# Patient Record
Sex: Female | Born: 1965 | Race: Black or African American | Hispanic: No | Marital: Single | Smoking: Never smoker
Health system: Southern US, Community
[De-identification: ages and names within clinical notes are randomized; demographics above are authoritative.]

## PROBLEM LIST (undated history)

## (undated) DIAGNOSIS — R51 Headache: Secondary | ICD-10-CM

## (undated) DIAGNOSIS — R42 Dizziness and giddiness: Secondary | ICD-10-CM

## (undated) DIAGNOSIS — J302 Other seasonal allergic rhinitis: Secondary | ICD-10-CM

## (undated) DIAGNOSIS — T7840XA Allergy, unspecified, initial encounter: Secondary | ICD-10-CM

## (undated) DIAGNOSIS — I10 Essential (primary) hypertension: Secondary | ICD-10-CM

## (undated) DIAGNOSIS — G4733 Obstructive sleep apnea (adult) (pediatric): Secondary | ICD-10-CM

## (undated) DIAGNOSIS — D649 Anemia, unspecified: Secondary | ICD-10-CM

## (undated) DIAGNOSIS — Z973 Presence of spectacles and contact lenses: Secondary | ICD-10-CM

## (undated) DIAGNOSIS — M1990 Unspecified osteoarthritis, unspecified site: Secondary | ICD-10-CM

## (undated) DIAGNOSIS — K2970 Gastritis, unspecified, without bleeding: Secondary | ICD-10-CM

## (undated) DIAGNOSIS — R519 Headache, unspecified: Secondary | ICD-10-CM

## (undated) HISTORY — DX: Allergy, unspecified, initial encounter: T78.40XA

## (undated) HISTORY — DX: Obstructive sleep apnea (adult) (pediatric): G47.33

## (undated) HISTORY — PX: OTHER SURGICAL HISTORY: SHX169

## (undated) HISTORY — DX: Anemia, unspecified: D64.9

## (undated) HISTORY — PX: COLONOSCOPY: SHX174

## (undated) HISTORY — DX: Essential (primary) hypertension: I10

---

## 2003-02-06 ENCOUNTER — Emergency Department (HOSPITAL_COMMUNITY): Admission: EM | Admit: 2003-02-06 | Discharge: 2003-02-06 | Payer: Self-pay | Admitting: Emergency Medicine

## 2005-10-26 ENCOUNTER — Emergency Department: Payer: Self-pay | Admitting: General Practice

## 2008-06-29 ENCOUNTER — Ambulatory Visit: Payer: Self-pay | Admitting: Internal Medicine

## 2010-06-19 ENCOUNTER — Ambulatory Visit: Payer: Self-pay | Admitting: Internal Medicine

## 2011-05-31 ENCOUNTER — Ambulatory Visit: Payer: Self-pay

## 2011-07-04 ENCOUNTER — Ambulatory Visit: Payer: Self-pay

## 2012-06-09 ENCOUNTER — Ambulatory Visit: Payer: Self-pay | Admitting: Podiatry

## 2012-10-15 ENCOUNTER — Ambulatory Visit: Payer: Self-pay | Admitting: Physician Assistant

## 2012-11-15 ENCOUNTER — Emergency Department: Payer: Self-pay | Admitting: Emergency Medicine

## 2012-11-15 LAB — COMPREHENSIVE METABOLIC PANEL
Alkaline Phosphatase: 112 U/L (ref 50–136)
BUN: 8 mg/dL (ref 7–18)
Bilirubin,Total: 0.4 mg/dL (ref 0.2–1.0)
Chloride: 106 mmol/L (ref 98–107)
Creatinine: 0.75 mg/dL (ref 0.60–1.30)
EGFR (African American): >60
SGPT (ALT): 29 U/L (ref 12–78)
Sodium: 140 mmol/L (ref 136–145)
Total Protein: 7.7 g/dL (ref 6.4–8.2)

## 2012-11-15 LAB — CBC
HCT: 34.3 % — ABNORMAL LOW (ref 35.0–47.0)
HGB: 10.9 g/dL — ABNORMAL LOW (ref 12.0–16.0)
MCH: 23.1 pg — ABNORMAL LOW (ref 26.0–34.0)
MCHC: 31.7 g/dL — ABNORMAL LOW (ref 32.0–36.0)
MCV: 73 fL — ABNORMAL LOW (ref 80–100)
Platelet: 327 10*3/uL (ref 150–440)
RDW: 16.5 % — ABNORMAL HIGH (ref 11.5–14.5)
WBC: 13.6 10*3/uL — ABNORMAL HIGH (ref 3.6–11.0)

## 2012-11-15 LAB — CK TOTAL AND CKMB (NOT AT ARMC)
CK, Total: 113 U/L (ref 21–215)
CK-MB: 0.7 ng/mL (ref 0.5–3.6)

## 2012-11-15 LAB — URINALYSIS, COMPLETE
Bacteria: NONE SEEN
Nitrite: NEGATIVE
Ph: 6 (ref 4.5–8.0)
Protein: 100
Specific Gravity: 1.028 (ref 1.003–1.030)

## 2012-12-09 HISTORY — PX: FOOT SURGERY: SHX648

## 2013-10-18 ENCOUNTER — Ambulatory Visit: Payer: Self-pay

## 2013-11-10 ENCOUNTER — Ambulatory Visit: Payer: Self-pay

## 2014-10-24 ENCOUNTER — Ambulatory Visit: Payer: Self-pay

## 2015-04-20 ENCOUNTER — Encounter: Payer: Self-pay | Admitting: Hematology and Oncology

## 2015-04-20 ENCOUNTER — Inpatient Hospital Stay: Payer: 59 | Attending: Hematology and Oncology | Admitting: Hematology and Oncology

## 2015-04-20 VITALS — BP 148/84 | HR 72 | Temp 98.2°F | Ht 65.0 in | Wt <= 1120 oz

## 2015-04-20 DIAGNOSIS — Z79899 Other long term (current) drug therapy: Secondary | ICD-10-CM | POA: Insufficient documentation

## 2015-04-20 DIAGNOSIS — R42 Dizziness and giddiness: Secondary | ICD-10-CM | POA: Diagnosis not present

## 2015-04-20 DIAGNOSIS — Z9884 Bariatric surgery status: Secondary | ICD-10-CM | POA: Diagnosis not present

## 2015-04-20 DIAGNOSIS — I10 Essential (primary) hypertension: Secondary | ICD-10-CM | POA: Insufficient documentation

## 2015-04-20 DIAGNOSIS — D509 Iron deficiency anemia, unspecified: Secondary | ICD-10-CM | POA: Diagnosis present

## 2015-04-20 NOTE — Progress Notes (Signed)
Pt here today for initial consult regarding IDA; referred by [facility]

## 2015-04-27 ENCOUNTER — Encounter: Payer: Self-pay | Admitting: *Deleted

## 2015-05-02 NOTE — Discharge Instructions (Signed)

## 2015-05-03 ENCOUNTER — Encounter: Payer: Self-pay | Admitting: *Deleted

## 2015-05-03 ENCOUNTER — Other Ambulatory Visit: Payer: Self-pay | Admitting: Gastroenterology

## 2015-05-03 ENCOUNTER — Ambulatory Visit
Admission: RE | Admit: 2015-05-03 | Discharge: 2015-05-03 | Disposition: A | Discharge status: Home / Self Care | Payer: 59 | Source: Ambulatory Visit | Attending: Gastroenterology | Admitting: Gastroenterology

## 2015-05-03 ENCOUNTER — Ambulatory Visit: Payer: 59 | Admitting: Anesthesiology

## 2015-05-03 ENCOUNTER — Encounter
Admission: RE | Disposition: A | Discharge status: Home / Self Care | Payer: Self-pay | Source: Ambulatory Visit | Attending: Gastroenterology

## 2015-05-03 DIAGNOSIS — K289 Gastrojejunal ulcer, unspecified as acute or chronic, without hemorrhage or perforation: Secondary | ICD-10-CM | POA: Insufficient documentation

## 2015-05-03 DIAGNOSIS — K648 Other hemorrhoids: Secondary | ICD-10-CM | POA: Insufficient documentation

## 2015-05-03 DIAGNOSIS — D509 Iron deficiency anemia, unspecified: Secondary | ICD-10-CM | POA: Diagnosis not present

## 2015-05-03 DIAGNOSIS — I10 Essential (primary) hypertension: Secondary | ICD-10-CM | POA: Insufficient documentation

## 2015-05-03 DIAGNOSIS — Z79899 Other long term (current) drug therapy: Secondary | ICD-10-CM | POA: Diagnosis not present

## 2015-05-03 DIAGNOSIS — M1990 Unspecified osteoarthritis, unspecified site: Secondary | ICD-10-CM | POA: Diagnosis not present

## 2015-05-03 DIAGNOSIS — D649 Anemia, unspecified: Secondary | ICD-10-CM | POA: Diagnosis present

## 2015-05-03 DIAGNOSIS — Z9884 Bariatric surgery status: Secondary | ICD-10-CM | POA: Diagnosis not present

## 2015-05-03 DIAGNOSIS — Z791 Long term (current) use of non-steroidal anti-inflammatories (NSAID): Secondary | ICD-10-CM | POA: Diagnosis not present

## 2015-05-03 DIAGNOSIS — D124 Benign neoplasm of descending colon: Secondary | ICD-10-CM | POA: Insufficient documentation

## 2015-05-03 HISTORY — PX: COLONOSCOPY: SHX5424

## 2015-05-03 HISTORY — DX: Unspecified osteoarthritis, unspecified site: M19.90

## 2015-05-03 HISTORY — DX: Other seasonal allergic rhinitis: J30.2

## 2015-05-03 HISTORY — DX: Presence of spectacles and contact lenses: Z97.3

## 2015-05-03 HISTORY — PX: POLYPECTOMY: SHX149

## 2015-05-03 HISTORY — PX: ESOPHAGOGASTRODUODENOSCOPY: SHX5428

## 2015-05-03 HISTORY — DX: Dizziness and giddiness: R42

## 2015-05-03 HISTORY — DX: Headache: R51

## 2015-05-03 HISTORY — DX: Headache, unspecified: R51.9

## 2015-05-03 SURGERY — COLONOSCOPY
Anesthesia: Monitor Anesthesia Care | Wound class: Contaminated

## 2015-05-03 MED ORDER — GLYCOPYRROLATE 0.2 MG/ML IJ SOLN
INTRAMUSCULAR | Status: DC | PRN
  Administered 2015-05-03: 0.1 mg via INTRAVENOUS

## 2015-05-03 MED ORDER — LIDOCAINE HCL (CARDIAC) 20 MG/ML IV SOLN
INTRAVENOUS | Status: DC | PRN
  Administered 2015-05-03: 50 mg via INTRAVENOUS

## 2015-05-03 MED ORDER — LACTATED RINGERS IV SOLN
INTRAVENOUS | Status: DC
  Administered 2015-05-03 (×2): via INTRAVENOUS

## 2015-05-03 MED ORDER — PROPOFOL 10 MG/ML IV BOLUS
INTRAVENOUS | Status: DC | PRN
  Administered 2015-05-03: 20 mg via INTRAVENOUS
  Administered 2015-05-03: 10 mg via INTRAVENOUS
  Administered 2015-05-03: 30 mg via INTRAVENOUS
  Administered 2015-05-03: 20 mg via INTRAVENOUS
  Administered 2015-05-03 (×2): 50 mg via INTRAVENOUS
  Administered 2015-05-03: 10 mg via INTRAVENOUS
  Administered 2015-05-03 (×2): 50 mg via INTRAVENOUS
  Administered 2015-05-03: 20 mg via INTRAVENOUS
  Administered 2015-05-03: 50 mg via INTRAVENOUS
  Administered 2015-05-03: 10 mg via INTRAVENOUS
  Administered 2015-05-03: 20 mg via INTRAVENOUS
  Administered 2015-05-03: 100 mg via INTRAVENOUS
  Administered 2015-05-03: 50 mg via INTRAVENOUS

## 2015-05-03 MED ORDER — STERILE WATER FOR IRRIGATION IR SOLN
Status: DC | PRN
  Administered 2015-05-03: 08:00:00

## 2015-05-03 SURGICAL SUPPLY — 38 items
BALLN DILATOR 10-12 8 (BALLOONS)
BALLN DILATOR 12-15 8 (BALLOONS)
BALLN DILATOR 15-18 8 (BALLOONS)
BALLN DILATOR CRE 0-12 8 (BALLOONS)
BALLN DILATOR ESOPH 8 10 CRE (MISCELLANEOUS) IMPLANT
BALLOON DILATOR 12-15 8 (BALLOONS) IMPLANT
BALLOON DILATOR 15-18 8 (BALLOONS) IMPLANT
BALLOON DILATOR CRE 0-12 8 (BALLOONS) IMPLANT
BLOCK BITE 60FR ADLT L/F GRN (MISCELLANEOUS) ×4 IMPLANT
CANISTER SUCT 1200ML W/VALVE (MISCELLANEOUS) ×4 IMPLANT
FCP ESCP3.2XJMB 240X2.8X (MISCELLANEOUS)
FORCEPS BIOP RAD 4 LRG CAP 4 (CUTTING FORCEPS) ×4 IMPLANT
FORCEPS BIOP RJ4 240 W/NDL (MISCELLANEOUS)
FORCEPS ESCP3.2XJMB 240X2.8X (MISCELLANEOUS) IMPLANT
GOWN CVR UNV OPN BCK APRN NK (MISCELLANEOUS) ×4 IMPLANT
GOWN ISOL THUMB LOOP REG UNIV (MISCELLANEOUS) ×4
HEMOCLIP INSTINCT (CLIP) IMPLANT
INJECTOR VARIJECT VIN23 (MISCELLANEOUS) IMPLANT
KIT CO2 TUBING (TUBING) ×4 IMPLANT
KIT DEFENDO VALVE AND CONN (KITS) IMPLANT
KIT ENDO PROCEDURE OLY (KITS) ×4 IMPLANT
LIGATOR MULTIBAND 6SHOOTER MBL (MISCELLANEOUS) IMPLANT
MARKER SPOT ENDO TATTOO 5ML (MISCELLANEOUS) IMPLANT
PAD GROUND ADULT SPLIT (MISCELLANEOUS) IMPLANT
SNARE SHORT THROW 13M SML OVAL (MISCELLANEOUS) ×4 IMPLANT
SNARE SHORT THROW 30M LRG OVAL (MISCELLANEOUS) IMPLANT
SPOT EX ENDOSCOPIC TATTOO (MISCELLANEOUS)
SUCTION POLY TRAP 4CHAMBER (MISCELLANEOUS) IMPLANT
SYR INFLATION 60ML (SYRINGE) IMPLANT
TRAP SUCTION POLY (MISCELLANEOUS) ×4 IMPLANT
TUBING CONN 6MMX3.1M (TUBING)
TUBING SUCTION CONN 0.25 STRL (TUBING) IMPLANT
UNDERPAD 30X60 958B10 (PK) (MISCELLANEOUS) IMPLANT
VALVE BIOPSY ENDO (VALVE) IMPLANT
VARIJECT INJECTOR VIN23 (MISCELLANEOUS)
WATER AUXILLARY (MISCELLANEOUS) IMPLANT
WATER STERILE IRR 500ML POUR (IV SOLUTION) ×4 IMPLANT
WIRE CRE 18-20MM 8CM F G (MISCELLANEOUS) IMPLANT

## 2015-05-03 NOTE — Op Note (Signed)
[facility] Gastroenterology Patient Name: Stephanie Gates Procedure Date: 05/03/2015 7:38 AM MRN: [id number] Account #: [id number] Date of Birth: 1966/10/31 Admit Type: Outpatient Age: 49 Room: [facility] OR ROOM 01 Gender: Female Note Status: Finalized Procedure:         Colonoscopy Indications:       Iron deficiency anemia Providers:         Lucilla Lame, MD Referring MD:      Placido Sou (Referring MD) Medicines:         Propofol per Anesthesia Complications:     No immediate complications. Procedure:         Pre-Anesthesia Assessment:                    - Prior to the procedure, a History and Physical was                     performed, and patient medications and allergies were                     reviewed. The patient's tolerance of previous anesthesia                     was also reviewed. The risks and benefits of the procedure                     and the sedation options and risks were discussed with the                     patient. All questions were answered, and informed consent                     was obtained. Prior Anticoagulants: The patient has taken                     no previous anticoagulant or antiplatelet agents. ASA                     Grade Assessment: II - A patient with mild systemic                     disease. After reviewing the risks and benefits, the                     patient was deemed in satisfactory condition to undergo                     the procedure.                    After obtaining informed consent, the colonoscope was                     passed under direct vision. Throughout the procedure, the                     patient's blood pressure, pulse, and oxygen saturations                     were monitored continuously. The Olympus CF H180AL                     colonoscope (S#: [id number]) was introduced through the anus  and advanced to the the cecum, identified by appendiceal                     orifice and  ileocecal valve. The colonoscopy was performed                     without difficulty. The patient tolerated the procedure                     well. The quality of the bowel preparation was excellent. Findings:      The perianal and digital rectal examinations were normal.      A 6 mm polyp was found in the descending colon. The polyp was sessile.       The polyp was removed with a cold snare. Resection and retrieval were       complete.      Non-bleeding internal hemorrhoids were found during retroflexion. The       hemorrhoids were Grade I (internal hemorrhoids that do not prolapse). Impression:        - One 6 mm polyp in the descending colon. Resected and                     retrieved.                    - Non-bleeding internal hemorrhoids. Recommendation:    - Await pathology results. Procedure Code(s): --- Professional ---                    [phone], Colonoscopy, flexible; with removal of tumor(s),                     polyp(s), or other lesion(s) by snare technique Diagnosis Code(s): --- Professional ---                    D50.9, Iron deficiency anemia, unspecified                    D12.4, Benign neoplasm of descending colon CPT copyright 2014 American Medical Association. All rights reserved. The codes documented in this report are preliminary and upon coder review may  be revised to meet current compliance requirements. Lucilla Lame, MD 05/03/2015 8:04:01 AM This report has been signed electronically. Number of Addenda: 0 Note Initiated On: 05/03/2015 7:38 AM Scope Withdrawal Time: 0 hours 5 minutes 37 seconds  Total Procedure Duration: 0 hours 11 minutes 49 seconds       [facility]

## 2015-05-03 NOTE — Op Note (Signed)
[facility] Gastroenterology Patient Name: Stephanie Gates Procedure Date: 05/03/2015 7:37 AM MRN: [id number] Account #: [id number] Date of Birth: [birth year] Admit Type: Outpatient Age: 49 Room: [facility] OR ROOM 01 Gender: Female Note Status: Finalized Procedure:         Upper GI endoscopy Indications:       Iron deficiency anemia Providers:         Lucilla Lame, MD Referring MD:      Placido Sou (Referring MD) Medicines:         Propofol per Anesthesia Complications:     No immediate complications. Procedure:         Pre-Anesthesia Assessment:                    - Prior to the procedure, a History and Physical was                     performed, and patient medications and allergies were                     reviewed. The patient's tolerance of previous anesthesia                     was also reviewed. The risks and benefits of the procedure                     and the sedation options and risks were discussed with the                     patient. All questions were answered, and informed consent                     was obtained. Prior Anticoagulants: The patient has taken                     no previous anticoagulant or antiplatelet agents. ASA                     Grade Assessment: II - A patient with mild systemic                     disease. After reviewing the risks and benefits, the                     patient was deemed in satisfactory condition to undergo                     the procedure.                    After obtaining informed consent, the endoscope was passed                     under direct vision. Throughout the procedure, the                     patient's blood pressure, pulse, and oxygen saturations                     were monitored continuously. The Olympus GIF H180J                     colonscope (M#:[id number]) was introduced through the mouth,  and advanced to the jejunum. The upper GI endoscopy was                     accomplished  without difficulty. The patient tolerated the                     procedure well. Findings:      Evidence of a gastric bypass was found. A gastric pouch was found. The       jejunojejunal anastomosis was characterized by ulceration.      Few non-bleeding cratered ulcers with no stigmata of bleeding were found       distal to the gastrojejunal anastomosis. Biopsies were taken with a cold       forceps for histology. Impression:        - Gastric bypass.                    - Jejunal ulcer with clean base. Biopsied. Recommendation:    - Await pathology results. 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 All rights reserved. The codes documented in this report are preliminary and upon coder review may  be revised to meet current compliance requirements. Lucilla Lame, MD 05/03/2015 7:46:56 AM This report has been signed electronically. Number of Addenda: 0 Note Initiated On: 05/03/2015 7:37 AM Total Procedure Duration: 0 hours 2 minutes 49 seconds       [facility]

## 2015-05-03 NOTE — Transfer of Care (Signed)
Immediate Anesthesia Transfer of Care Note  Patient: Stephanie Gates  Procedure(s) Performed: Procedure(s): COLONOSCOPY (N/A) ESOPHAGOGASTRODUODENOSCOPY (EGD) (N/A) POLYPECTOMY INTESTINAL  Patient Location: PACU  Anesthesia Type: MAC  Level of Consciousness: awake, alert  and patient cooperative  Airway and Oxygen Therapy: Patient Spontanous Breathing and Patient connected to supplemental oxygen  Post-op Assessment: Post-op Vital signs reviewed, Patient's Cardiovascular Status Stable, Respiratory Function Stable, Patent Airway and No signs of Nausea or vomiting  Post-op Vital Signs: Reviewed and stable  Complications: No apparent anesthesia complications

## 2015-05-03 NOTE — Anesthesia Procedure Notes (Signed)
Procedure Name: MAC Performed by: Thao Vanover Pre-anesthesia Checklist: Patient identified, Emergency Drugs available, Suction available, Patient being monitored and Timeout performed Patient Re-evaluated:Patient Re-evaluated prior to inductionOxygen Delivery Method: Nasal cannula Placement Confirmation: positive ETCO2 and breath sounds checked- equal and bilateral     

## 2015-05-03 NOTE — Anesthesia Postprocedure Evaluation (Signed)
  Anesthesia Post-op Note  Patient: Stephanie Gates  Procedure(s) Performed: Procedure(s): COLONOSCOPY (N/A) ESOPHAGOGASTRODUODENOSCOPY (EGD) (N/A) POLYPECTOMY INTESTINAL  Anesthesia type:MAC  Patient location: PACU  Post pain: Pain level controlled  Post assessment: Post-op Vital signs reviewed, Patient's Cardiovascular Status Stable, Respiratory Function Stable, Patent Airway and No signs of Nausea or vomiting  Post vital signs: Reviewed and stable  Last Vitals:  Filed Vitals:   05/03/15 0816  BP: 124/69  Pulse: 76  Temp:   Resp: 16    Level of consciousness: awake, alert  and patient cooperative  Complications: No apparent anesthesia complications

## 2015-05-03 NOTE — Anesthesia Preprocedure Evaluation (Signed)
Anesthesia Evaluation  Patient identified by MRN, date of birth, ID band Patient awake    Reviewed: Allergy & Precautions, H&P , Patient's Chart, lab work & pertinent test results  Airway Mallampati: I  TM Distance: >3 FB Neck ROM: full    Dental no notable dental hx.    Pulmonary neg pulmonary ROS,    Pulmonary exam normal       Cardiovascular hypertension, Normal cardiovascular exam    Neuro/Psych    GI/Hepatic negative GI ROS, Neg liver ROS,   Endo/Other  negative endocrine ROS  Renal/GU negative Renal ROS     Musculoskeletal   Abdominal   Peds  Hematology   Anesthesia Other Findings   Reproductive/Obstetrics                             Anesthesia Physical Anesthesia Plan  ASA: II  Anesthesia Plan: MAC   Post-op Pain Management:    Induction:   Airway Management Planned:   Additional Equipment:   Intra-op Plan:   Post-operative Plan:   Informed Consent: I have reviewed the patients History and Physical, chart, labs and discussed the procedure including the risks, benefits and alternatives for the proposed anesthesia with the patient or authorized representative who has indicated his/her understanding and acceptance.     Plan Discussed with: CRNA  Anesthesia Plan Comments:         Anesthesia Quick Evaluation

## 2015-05-03 NOTE — H&P (Signed)
  [facility]  [address], [place], [place] [address] Phone: [phone] Fax : [phone]  Primary Care Physician:  Ronnell Freshwater, NP Primary Gastroenterologist:  Dr. Allen Norris  Pre-Procedure History & Physical: HPI:  Stephanie Gates is a 49 y.o. female is here for an endoscopy and colonoscopy.   Past Medical History  Diagnosis Date  . Anemia   . Allergy     seasonal  . Hypertension   . Arthritis     right hip  . Seasonal allergies   . Headache     sinus/allergies  . Vertigo     1-2x/month  . Wears contact lenses     Past Surgical History  Procedure Laterality Date  . Gastric bypass surgery    . Foot surgery  2014    Prior to Admission medications   Medication Sig Start Date End Date Taking? Authorizing Provider  Cyanocobalamin (VITAMIN B-12 PO) Take by mouth.   Yes Historical Provider, MD  Fe Cbn-Fe Gluc-FA-B12-C-DSS (FERRALET 90) 90-1 MG TABS  04/14/15  Yes Historical Provider, MD  ibuprofen (ADVIL,MOTRIN) 800 MG tablet  02/14/15  Yes Historical Provider, MD  Multiple Vitamin (MULTIVITAMIN) capsule Take 1 capsule by mouth daily.   Yes Historical Provider, MD  pseudoephedrine-acetaminophen (TYLENOL SINUS) 30-500 MG TABS Take 1 tablet by mouth every 4 (four) hours as needed.   Yes Historical Provider, MD  hydrochlorothiazide (MICROZIDE) 12.5 MG capsule Take by mouth. PM    Historical Provider, MD    Allergies as of 04/26/2015 - Review Complete 04/20/2015  Allergen Reaction Noted  . Penicillin g Itching 04/20/2015    Family History  Problem Relation Age of Onset  . Diabetes Mother   . Hypertension Mother   . Cancer Father   . Diabetes Father   . Hypertension Father     History   Social History  . Marital Status: Single    Spouse Name: N/A  . Number of Children: N/A  . Years of Education: N/A   Occupational History  . Not on file.   Social History Main Topics  . Smoking status: Never Smoker   . Smokeless tobacco: Never Used    . Alcohol Use: No  . Drug Use: No  . Sexual Activity: Not on file   Other Topics Concern  . Not on file   Social History Narrative    Review of Systems: See HPI, otherwise negative ROS  Physical Exam: BP 147/68 mmHg  Pulse 70  Resp 18  Ht 5\' 5"  (1.651 m)  Wt 272 lb (123.378 kg)  BMI 45.26 kg/m2  SpO2 100%  LMP 04/16/2015 (Exact Date) General:   Alert,  pleasant and cooperative in NAD Head:  Normocephalic and atraumatic. Neck:  Supple; no masses or thyromegaly. Lungs:  Clear throughout to auscultation.    Heart:  Regular rate and rhythm. Abdomen:  Soft, nontender and nondistended. Normal bowel sounds, without guarding, and without rebound.   Neurologic:  Alert and  oriented x4;  grossly normal neurologically.  Impression/Plan: Stephanie Gates is here for an endoscopy and colonoscopy to be performed for Anemia  Risks, benefits, limitations, and alternatives regarding  endoscopy and colonoscopy have been reviewed with the patient.  Questions have been answered.  All parties agreeable.   [facility], MD  05/03/2015, 7:32 AM

## 2015-05-05 ENCOUNTER — Encounter: Payer: Self-pay | Admitting: Gastroenterology

## 2015-05-10 ENCOUNTER — Encounter: Payer: Self-pay | Admitting: Hematology and Oncology

## 2015-05-12 ENCOUNTER — Inpatient Hospital Stay: Payer: 59

## 2015-05-12 ENCOUNTER — Telehealth: Payer: Self-pay | Admitting: Hematology and Oncology

## 2015-05-12 ENCOUNTER — Inpatient Hospital Stay: Payer: 59 | Attending: Hematology and Oncology | Admitting: Hematology and Oncology

## 2015-05-12 ENCOUNTER — Other Ambulatory Visit: Payer: 59

## 2015-05-12 VITALS — BP 144/82 | HR 75 | Temp 98.0°F | Ht 65.0 in | Wt 276.7 lb

## 2015-05-12 DIAGNOSIS — Z9884 Bariatric surgery status: Secondary | ICD-10-CM | POA: Insufficient documentation

## 2015-05-12 DIAGNOSIS — Z79899 Other long term (current) drug therapy: Secondary | ICD-10-CM | POA: Insufficient documentation

## 2015-05-12 DIAGNOSIS — K648 Other hemorrhoids: Secondary | ICD-10-CM | POA: Insufficient documentation

## 2015-05-12 DIAGNOSIS — D509 Iron deficiency anemia, unspecified: Secondary | ICD-10-CM | POA: Diagnosis present

## 2015-05-12 DIAGNOSIS — R42 Dizziness and giddiness: Secondary | ICD-10-CM | POA: Insufficient documentation

## 2015-05-12 DIAGNOSIS — I10 Essential (primary) hypertension: Secondary | ICD-10-CM | POA: Insufficient documentation

## 2015-05-12 DIAGNOSIS — D124 Benign neoplasm of descending colon: Secondary | ICD-10-CM | POA: Diagnosis not present

## 2015-05-12 NOTE — Progress Notes (Signed)
Pt here for follow up regarding IDA and lab results; had colonoscopy and endoscopy May 27

## 2015-06-09 ENCOUNTER — Telehealth: Payer: Self-pay | Admitting: *Deleted

## 2015-06-09 ENCOUNTER — Encounter: Payer: Self-pay | Admitting: *Deleted

## 2015-06-09 DIAGNOSIS — D509 Iron deficiency anemia, unspecified: Secondary | ICD-10-CM | POA: Insufficient documentation

## 2015-06-09 NOTE — Telephone Encounter (Signed)
Orders faxed as directed. md ordered a cbc, iron and ferritin

## 2015-06-12 ENCOUNTER — Ambulatory Visit: Payer: 59

## 2015-06-13 ENCOUNTER — Inpatient Hospital Stay: Payer: 59

## 2015-06-14 ENCOUNTER — Inpatient Hospital Stay: Payer: 59

## 2015-06-15 ENCOUNTER — Encounter: Payer: Self-pay | Admitting: Hematology and Oncology

## 2015-06-16 ENCOUNTER — Inpatient Hospital Stay: Payer: 59 | Attending: Hematology and Oncology

## 2015-06-16 VITALS — BP 127/78 | HR 75 | Temp 97.0°F | Resp 20

## 2015-06-16 DIAGNOSIS — Z9884 Bariatric surgery status: Secondary | ICD-10-CM | POA: Diagnosis not present

## 2015-06-16 DIAGNOSIS — D509 Iron deficiency anemia, unspecified: Secondary | ICD-10-CM | POA: Diagnosis present

## 2015-06-16 DIAGNOSIS — Z79899 Other long term (current) drug therapy: Secondary | ICD-10-CM | POA: Insufficient documentation

## 2015-06-16 MED ORDER — SODIUM CHLORIDE 0.9 % IV SOLN
Freq: Once | INTRAVENOUS | Status: AC
  Administered 2015-06-16: 16:00:00 via INTRAVENOUS
  Filled 2015-06-16: qty 1000

## 2015-06-16 MED ORDER — SODIUM CHLORIDE 0.9 % IV SOLN
200.0000 mg | Freq: Once | INTRAVENOUS | Status: AC
  Administered 2015-06-16: 200 mg via INTRAVENOUS
  Filled 2015-06-16: qty 10

## 2015-06-23 ENCOUNTER — Inpatient Hospital Stay: Payer: 59

## 2015-06-23 DIAGNOSIS — D509 Iron deficiency anemia, unspecified: Secondary | ICD-10-CM | POA: Diagnosis not present

## 2015-06-23 MED ORDER — SODIUM CHLORIDE 0.9 % IV SOLN
200.0000 mg | Freq: Once | INTRAVENOUS | Status: AC
  Administered 2015-06-23: 200 mg via INTRAVENOUS
  Filled 2015-06-23: qty 10

## 2015-06-23 MED ORDER — SODIUM CHLORIDE 0.9 % IV SOLN
Freq: Once | INTRAVENOUS | Status: AC
  Administered 2015-06-23: 20 mL/h via INTRAVENOUS
  Filled 2015-06-23: qty 1000

## 2015-06-30 ENCOUNTER — Inpatient Hospital Stay: Payer: 59

## 2015-06-30 VITALS — BP 130/81 | HR 71 | Temp 97.7°F | Resp 20

## 2015-06-30 DIAGNOSIS — D509 Iron deficiency anemia, unspecified: Secondary | ICD-10-CM

## 2015-06-30 MED ORDER — SODIUM CHLORIDE 0.9 % IV SOLN
200.0000 mg | Freq: Once | INTRAVENOUS | Status: AC
  Administered 2015-06-30: 200 mg via INTRAVENOUS
  Filled 2015-06-30: qty 10

## 2015-06-30 MED ORDER — SODIUM CHLORIDE 0.9 % IV SOLN
Freq: Once | INTRAVENOUS | Status: AC
  Administered 2015-06-30: 15:00:00 via INTRAVENOUS
  Filled 2015-06-30: qty 1000

## 2015-07-05 ENCOUNTER — Telehealth: Payer: Self-pay

## 2015-07-05 ENCOUNTER — Telehealth: Payer: Self-pay | Admitting: *Deleted

## 2015-07-05 NOTE — Telephone Encounter (Signed)
Requesting an order be faxed to [facility] in preparation for appt on Friday

## 2015-07-05 NOTE — Telephone Encounter (Signed)
Faxed [facility] order sheet to [phone]  For CBC w/ Diff and Ferritin.

## 2015-07-06 ENCOUNTER — Encounter: Payer: Self-pay | Admitting: Hematology and Oncology

## 2015-07-07 ENCOUNTER — Inpatient Hospital Stay (HOSPITAL_BASED_OUTPATIENT_CLINIC_OR_DEPARTMENT_OTHER): Payer: 59 | Admitting: Hematology and Oncology

## 2015-07-07 ENCOUNTER — Inpatient Hospital Stay: Payer: 59

## 2015-07-07 VITALS — BP 137/84 | HR 69 | Temp 98.0°F | Resp 18 | Ht 65.0 in | Wt 283.8 lb

## 2015-07-07 DIAGNOSIS — D509 Iron deficiency anemia, unspecified: Secondary | ICD-10-CM | POA: Diagnosis not present

## 2015-07-07 DIAGNOSIS — Z9884 Bariatric surgery status: Secondary | ICD-10-CM | POA: Diagnosis not present

## 2015-07-07 DIAGNOSIS — Z79899 Other long term (current) drug therapy: Secondary | ICD-10-CM | POA: Diagnosis not present

## 2015-07-07 NOTE — Progress Notes (Signed)
[facility] day:  07/07/2015  Chief Complaint: Stephanie Gates is a 49 y.o. female with history of gastric bypass surgery and severe iron deficiency anemia who is seen for reassessment after initiation of IV iron.  HPI: The patient was last seen in the medical oncology clinic on 05/12/2015.  At that time, she had been on oral iron x 3 weeks.  Labs on 05/10/2015 had revealed a hematocrit of 34.7, hemoglobin 10.8, and MCV 74. Iron studies included a TIBC of 457 (high) and an iron saturation of 5%. Ferritin was 8 (low). She felt "pretty good".  We discussed continuation of oral iron and follow-up labs.  If her iron stores did not improve, IV iron was to be started.  Labs on 06/14/2015 revealed a hematocrit 34.2, hemoglobin 10.6, MCV 75, platelets 310,000, white count 12,600 with an [place] of 6900. Absolute lymphocyte count was 4600. Iron studies included a TIBC of 422 and a saturation of 6%.  Ferritin was 7 (low).  She received Venofer 200 mg for 07/08, 07/15 and 06/30/2015.  Labs on 07/06/2015 revealed a hematocrit of 36.3, hemoglobin 11.7, MCV 76, platelets 288,000, white count 11,000 with an ANC of 6200.  Ferritin was 150.  Symptomatically, she notes more energy.  She feels very good but is "not 100%".  She stopped craving ice.  Past Medical History  Diagnosis Date  . Anemia   . Allergy     seasonal  . Hypertension   . Arthritis     right hip  . Seasonal allergies   . Headache     sinus/allergies  . Vertigo     1-2x/month  . Wears contact lenses     Past Surgical History  Procedure Laterality Date  . Gastric bypass surgery    . Foot surgery  2014  . Colonoscopy N/A 05/03/2015    Procedure: COLONOSCOPY;  Surgeon: Lucilla Lame, MD;  Location: [facility];  Service: Gastroenterology;  Laterality: N/A;  . Esophagogastroduodenoscopy N/A 05/03/2015    Procedure: ESOPHAGOGASTRODUODENOSCOPY (EGD);  Surgeon: Lucilla Lame, MD;  Location:  [place] [facility];  Service: Gastroenterology;  Laterality: N/A;  . Polypectomy  05/03/2015    Procedure: POLYPECTOMY INTESTINAL;  Surgeon: Lucilla Lame, MD;  Location: [facility];  Service: Gastroenterology;;    Family History  Problem Relation Age of Onset  . Diabetes Mother   . Hypertension Mother   . Cancer Father   . Diabetes Father   . Hypertension Father     Social History:  reports that she has never smoked. She has never used smokeless tobacco. She reports that she does not drink alcohol or use illicit drugs.  The patient is accompanied by her mom today.  Allergies:  Allergies  Allergen Reactions  . Penicillin G Itching    Current Medications: Current Outpatient Prescriptions  Medication Sig Dispense Refill  . Cyanocobalamin (VITAMIN B-12 PO) Take by mouth.    . Fe Cbn-Fe Gluc-FA-B12-C-DSS (FERRALET 90) 90-1 MG TABS     . hydrochlorothiazide (MICROZIDE) 12.5 MG capsule Take by mouth. PM    . ibuprofen (ADVIL,MOTRIN) 800 MG tablet     . Multiple Vitamin (MULTIVITAMIN) capsule Take 1 capsule by mouth daily.    . pseudoephedrine-acetaminophen (TYLENOL SINUS) 30-500 MG TABS Take 1 tablet by mouth every 4 (four) hours as needed.    . sucralfate (CARAFATE) 1 G tablet      No current facility-administered medications for this visit.    Review of  Systems:  GENERAL:  Feels good.  Active.  No fevers, sweats or weight loss. PERFORMANCE STATUS (ECOG):  0 HEENT:  No visual changes, runny nose, sore throat, mouth sores or tenderness. Lungs: No shortness of breath or cough.  No hemoptysis. Cardiac:  No chest pain, palpitations, orthopnea, or PND. GI:  No nausea, vomiting, diarrhea, constipation, melena or hematochezia. GU:  No urgency, frequency, dysuria, or hematuria. Musculoskeletal:  No back pain.  No joint pain.  No muscle tenderness. Extremities:  No pain or swelling. Skin:  No rashes or skin changes. Neuro:  No headache, numbness or weakness, balance or  coordination issues. Endocrine:  No diabetes, thyroid issues, hot flashes or night sweats. Psych:  No mood changes, depression or anxiety. Pain:  No focal pain. Review of systems:  All other systems reviewed and found to be negative.  Physical Exam: Blood pressure 137/84, pulse 69, temperature 98 F (36.7 C), temperature source Tympanic, resp. rate 18, height _0  (1.651 m), weight 283 lb 13.5 oz (128.75 kg). GENERAL:  Well developed, well nourished, woman sitting comfortably in the exam room in no acute distress. MENTAL STATUS:  Alert and oriented to person, place and time. HEAD:  [place] styled hair.  Normocephalic, atraumatic, face symmetric, no Cushingoid features. EYES:  Brown eyes.  Pupils equal round and reactive to light and accomodation.  No conjunctivitis or scleral icterus. ENT:  Oropharynx clear without lesion.  Tongue normal. Mucous membranes moist.  RESPIRATORY:  Clear to auscultation without rales, wheezes or rhonchi. CARDIOVASCULAR:  Regular rate and rhythm without murmur, rub or gallop. ABDOMEN:  Soft, non-tender, with active bowel sounds, and no appreciable hepatosplenomegaly.  No masses. SKIN:  No rashes, ulcers or lesions. EXTREMITIES: No edema, no skin discoloration or tenderness.  No palpable cords. LYMPH NODES: No palpable cervical, supraclavicular, axillary or inguinal adenopathy  NEUROLOGICAL: Unremarkable. PSYCH:  Appropriate.  No visits with results within 3 Day(s) from this visit. Latest known visit with results is:  [facility] Conversion on 11/15/2012  Component Date Value Ref Range Status  . WBC 11/15/2012 13.6* 3.6-11.0 x10 3/mm 3 Final  . RBC 11/15/2012 4.70  3.80-5.20 X10 6/mm 3 Final  . HGB 11/15/2012 10.9* 12.0-16.0 g/dL Final  . HCT 11/15/2012 34.3* 35.0-47.0 % Final  . MCV 11/15/2012 73* 80-100 fL Final  . MCH 11/15/2012 23.1* 26.0-34.0 pg Final  . MCHC 11/15/2012 31.7* 32.0-36.0 g/dL Final  . RDW 11/15/2012 16.5* 11.5-14.5 % Final  . Platelet  11/15/2012 327  150-440 x10 3/mm 3 Final  . Glucose 11/15/2012 95  65-99 mg/dL Final  . BUN 11/15/2012 8  7-18 mg/dL Final  . Creatinine 11/15/2012 0.75  0.60-1.30 mg/dL Final  . Sodium 11/15/2012 140  136-145 mmol/L Final  . Potassium 11/15/2012 3.2* 3.5-5.1 mmol/L Final  . Chloride 11/15/2012 106  98-107 mmol/L Final  . Co2 11/15/2012 29  21-32 mmol/L Final  . Calcium, Total 11/15/2012 9.0  8.5-10.1 mg/dL Final  . SGOT(AST) 11/15/2012 24  15-37 Unit/L Final  . SGPT (ALT) 11/15/2012 29  12-78 U/L Final  . Alkaline Phosphatase 11/15/2012 112  50-136 Unit/L Final  . Albumin 11/15/2012 3.2* 3.4-5.0 g/dL Final  . Total Protein 11/15/2012 7.7  6.4-8.2 g/dL Final  . Bilirubin,Total 11/15/2012 0.4  0.2-1.0 mg/dL Final  . Osmolality 11/15/2012 278  275-301 Final  . Anion Gap 11/15/2012 5* 7-16 Final  . EGFR (African American) 11/15/2012 >60   Final  . EGFR (Non-African Amer.) 11/15/2012 >60   Final   Comment: eGFR values <  53m/min/1.73 m2 may be an indication of chronic kidney disease (CKD). Calculated eGFR is useful in patients with stable renal function. The eGFR calculation will not be reliable in acutely ill patients when serum creatinine is changing rapidly. It is not useful in  patients on dialysis. The eGFR calculation may not be applicable to patients at the low and high extremes of body sizes, pregnant women, and vegetarians.   . Troponin-I 11/15/2012 < 0.02   Final   Comment: 0.00-0.05 0.05 ng/mL or less: NEGATIVE  Repeat testing in 3-6 hrs  if clinically indicated. >0.05 ng/mL: POTENTIAL  MYOCARDIAL INJURY. Repeat  testing in 3-6 hrs if  clinically indicated. NOTE: An increase or decrease  of 30% or more on serial  testing suggests a  clinically important change   . CK, Total 11/15/2012 113  21-215 Unit/L Final  . CK-MB 11/15/2012 0.7  0.5-3.6 ng/mL Final  . Color - urine 11/15/2012 Amber   Final  . Clarity - urine 11/15/2012 Turbid   Final  . GSeward Meth12/07/2012  Negative  0-75 mg/dL Final  . Bilirubin,UR 11/15/2012 Negative  NEGATIVE Final  . Ketone 11/15/2012 Negative  NEGATIVE Final  . Specific Gravity 11/15/2012 1.028  1.003-1.030 Final  . Blood 11/15/2012 3+  NEGATIVE Final  . Ph 11/15/2012 6.0  4.5-8.0 Final  . Protein 11/15/2012 100 mg/dL  NEGATIVE Final  . Nitrite 11/15/2012 Negative  NEGATIVE Final  . Leukocyte Esterase 11/15/2012 Trace  NEGATIVE Final  . RBC,UR 11/15/2012 2793 /HPF  0-5 /HPF Final  . WBC UR 11/15/2012 7 /HPF  0-5 /HPF Final  . Bacteria 11/15/2012 NONE SEEN  NONE SEEN Final  . Squamous Epithelial 11/15/2012 2 /HPF   Final  . Mucous 11/15/2012 PRESENT   Final    Assessment:  Stephanie Mccreadyis a 49y.o. female status post gastric bypass surgery approximately 10 years ago with a progressive microcytic anemia since 11/2013. Iron studies are consistent with iron deficiency anemia (ferritin 6, iron satration 4%). She takes oral B12. Labs on 04/04/2015 revealed a normal B12, folate, and TSH. She denies any melena, hematochezia, or hematuria. Menses are light. Diet is good.  She was on oral iron for 2 months without significant improvement.  EGD and colonoscopy on 05/04/2015 revealed ulceration at the jejunojejunal anastomosis. There were a few non-bleeding cratered ulcers with no stigmata of bleeding distal to the gastrojejunal anastomosis. Biopsies revealed inflammation with focal erosion. Colonoscopy revealed one 6 mm polyp in the descending colon (tubular adenoma) and non-bleeding internal hemorrhoids.  She received IV Venofer from 06/16/2015 - 06/30/2015.  Labs on 07/06/2015 revealed a hematocrit of 36.3, hemoglobin 11.7, and MCV 76. Ferritin was 150.  Symptomatically, she feels "almost 100%". Ice pica has resolved.  Exam is unremarkable.  Plan: 1. Review interval [facility] labs. 2. RTC in 3 months for MD assess and review of [facility] labs.    MLequita Asal MD  07/07/2015, 11:25 PM

## 2015-08-15 ENCOUNTER — Ambulatory Visit: Payer: 59 | Admitting: Hematology and Oncology

## 2015-10-03 ENCOUNTER — Other Ambulatory Visit: Payer: Self-pay | Admitting: Nurse Practitioner

## 2015-10-03 ENCOUNTER — Encounter: Payer: Self-pay | Admitting: Gastroenterology

## 2015-10-03 DIAGNOSIS — Z1231 Encounter for screening mammogram for malignant neoplasm of breast: Secondary | ICD-10-CM

## 2015-10-04 NOTE — Telephone Encounter (Signed)
error 

## 2015-10-09 ENCOUNTER — Inpatient Hospital Stay: Payer: 59 | Attending: Hematology and Oncology | Admitting: Hematology and Oncology

## 2015-10-26 ENCOUNTER — Ambulatory Visit
Admission: RE | Admit: 2015-10-26 | Discharge: 2015-10-26 | Disposition: A | Discharge status: Home / Self Care | Payer: 59 | Source: Ambulatory Visit | Attending: Nurse Practitioner | Admitting: Nurse Practitioner

## 2015-10-26 DIAGNOSIS — Z1231 Encounter for screening mammogram for malignant neoplasm of breast: Secondary | ICD-10-CM

## 2015-11-09 ENCOUNTER — Encounter: Payer: Self-pay | Admitting: Hematology and Oncology

## 2015-12-11 NOTE — Progress Notes (Signed)
[facility] day:  04/20/2015  Chief Complaint: Stephanie Gates is a 50 y.o. female with history of gastric bypass surgery and severe iron deficiency anemia who is referred by Dr. Clayborn Bigness for assessment and management.  HPI:   The patient states that she underwent gastric bypass surgery approximately 10 years ago. She has been "borderline anemic" for years. Approximately 3 weeks ago after routine blood work , she was told to take iron pills.  She started taking Ferralet 90 one pill a day since last week. She is B12 deficient.  She has a appointment with GI for an EGD and colonoscopy.  She denies any melena or hematochezia. She notes that her menstrual periods are "mostly light".  She describes her diet as "anything I see".   She describes eating spinach, cabbage, lean meat and chicken, apples, and strawberries.  She will typically eat 2 servings of meat a day.  She eats a "ton of office sweets".  Labs from 04/04/2015 included a hematocrit of 34, hemoglobin 11.1, MCV 71, platelets 336,000, and white count 10,300. Comprehensive metabolic panel included a creatinine of 0.87, protein 7.0, albumin 4.3,and calcium 9.3.  Iron studies included a saturation of 4%, TIBC 444, and ferritin 6 (low).  B12 was greater than 6160 and folic acid was 73.7. Thyroid functions were normal.  CBC from 12/07/2013 included a hematocrit 34.7, hemoglobin, 11.1, and MCV 76.  Symptomatically, she has been tired for about a month.  She denies any chest pain or shortness of breath.  Past Medical History  Diagnosis Date  . Anemia   . Allergy     seasonal  . Hypertension   . Arthritis     right hip  . Seasonal allergies   . Headache     sinus/allergies  . Vertigo     1-2x/month  . Wears contact lenses     Past Surgical History  Procedure Laterality Date  . Gastric bypass surgery    . Foot surgery  2014  . Colonoscopy N/A 05/03/2015    Procedure: COLONOSCOPY;   Surgeon: Lucilla Lame, MD;  Location: [place];  Service: Gastroenterology;  Laterality: N/A;  . Esophagogastroduodenoscopy N/A 05/03/2015    Procedure: ESOPHAGOGASTRODUODENOSCOPY (EGD);  Surgeon: Lucilla Lame, MD;  Location: [place];  Service: Gastroenterology;  Laterality: N/A;  . Polypectomy  05/03/2015    Procedure: POLYPECTOMY INTESTINAL;  Surgeon: Lucilla Lame, MD;  Location: [place];  Service: Gastroenterology;;    Family History  Problem Relation Age of Onset  . Diabetes Mother   . Hypertension Mother   . Cancer Father   . Diabetes Father   . Hypertension Father     Social History:  reports that she has never smoked. She has never used smokeless tobacco. She reports that she does not drink alcohol or use illicit drugs.  The patient lives in [place].  The patient is accompanied by her daughter, Janeth Rase, today.  Allergies:  Allergies  Allergen Reactions  . Penicillin G Itching    Current Medications: Current Outpatient Prescriptions  Medication Sig Dispense Refill  . Cyanocobalamin (VITAMIN B-12 PO) Take by mouth.    . Fe Cbn-Fe Gluc-FA-B12-C-DSS (FERRALET 90) 90-1 MG TABS     . hydrochlorothiazide (MICROZIDE) 12.5 MG capsule Take by mouth. PM    . ibuprofen (ADVIL,MOTRIN) 800 MG tablet     . Multiple Vitamin (MULTIVITAMIN) capsule Take 1 capsule by mouth daily.    . pseudoephedrine-acetaminophen (TYLENOL SINUS)  30-500 MG TABS Take 1 tablet by mouth every 4 (four) hours as needed.    . sucralfate (CARAFATE) 1 G tablet      No current facility-administered medications for this visit.    Review of Systems:  GENERAL:  Tired for 1 month.  No fevers, sweats or weight loss. PERFORMANCE STATUS (ECOG):  1 HEENT:  Sees "dots".  No runny nose, sore throat, mouth sores or tenderness. Lungs: No shortness of breath or cough.  No hemoptysis. Cardiac:  No chest pain, palpitations, orthopnea, or PND. GI:  "Bit of constipation".  No nausea, vomiting,  diarrhea, melena or hematochezia. GU:  No urgency, frequency, dysuria, or hematuria. Musculoskeletal:  No back pain.  No joint pain.  No muscle tenderness. Extremities:  No pain or swelling. Skin:  No rashes or skin changes. Neuro:  Sinus headaches.  No numbness or weakness, balance or coordination issues. Endocrine:  No diabetes, thyroid issues, hot flashes or night sweats. Psych:  No mood changes, depression or anxiety. Pain:  No focal pain. Review of systems:  All other systems reviewed and found to be negative.  Physical Exam: Blood pressure 148/84, pulse 72, temperature 98.2 F (36.8 C), temperature source Tympanic, height '5\' 5"'  (1.651 m), weight 27 lb 4 oz (12.36 kg). GENERAL:  Well developed, well nourished, woman sitting comfortably in the exam room in no acute distress. MENTAL STATUS:  Alert and oriented to person, place and time. HEAD:  Shoulder length black styled hair.  Normocephalic, atraumatic, face symmetric, no Cushingoid features. EYES:  Brown eyes.  Pupils equal round and reactive to light and accomodation.  No conjunctivitis or scleral icterus. ENT:  Oropharynx clear without lesion.  Tongue normal. Mucous membranes moist.  RESPIRATORY:  Clear to auscultation without rales, wheezes or rhonchi. CARDIOVASCULAR:  Regular rate and rhythm without murmur, rub or gallop. ABDOMEN:  Soft, non-tender, with active bowel sounds, and no appreciable hepatosplenomegaly.  No masses. SKIN:  No rashes, ulcers or lesions. EXTREMITIES: No edema, no skin discoloration or tenderness.  No palpable cords. LYMPH NODES: No palpable cervical, supraclavicular, axillary or inguinal adenopathy  NEUROLOGICAL: Unremarkable. PSYCH:  Appropriate.  No visits with results within 3 Day(s) from this visit. Latest known visit with results is:  [facility] Conversion on 11/15/2012  Component Date Value Ref Range Status  . WBC 11/15/2012 13.6* 3.6-11.0 x10 3/mm 3 Final  . RBC 11/15/2012 4.70  3.80-5.20 X10 6/mm  3 Final  . HGB 11/15/2012 10.9* 12.0-16.0 g/dL Final  . HCT 11/15/2012 34.3* 35.0-47.0 % Final  . MCV 11/15/2012 73* 80-100 fL Final  . MCH 11/15/2012 23.1* 26.0-34.0 pg Final  . MCHC 11/15/2012 31.7* 32.0-36.0 g/dL Final  . RDW 11/15/2012 16.5* 11.5-14.5 % Final  . Platelet 11/15/2012 327  150-440 x10 3/mm 3 Final  . Glucose 11/15/2012 95  65-99 mg/dL Final  . BUN 11/15/2012 8  7-18 mg/dL Final  . Creatinine 11/15/2012 0.75  0.60-1.30 mg/dL Final  . Sodium 11/15/2012 140  136-145 mmol/L Final  . Potassium 11/15/2012 3.2* 3.5-5.1 mmol/L Final  . Chloride 11/15/2012 106  98-107 mmol/L Final  . Co2 11/15/2012 29  21-32 mmol/L Final  . Calcium, Total 11/15/2012 9.0  8.5-10.1 mg/dL Final  . SGOT(AST) 11/15/2012 24  15-37 Unit/L Final  . SGPT (ALT) 11/15/2012 29  12-78 U/L Final  . Alkaline Phosphatase 11/15/2012 112  50-136 Unit/L Final  . Albumin 11/15/2012 3.2* 3.4-5.0 g/dL Final  . Total Protein 11/15/2012 7.7  6.4-8.2 g/dL Final  . Bilirubin,Total 11/15/2012 0.4  0.2-1.0 mg/dL Final  . Osmolality 11/15/2012 278  275-301 Final  . Anion Gap 11/15/2012 5* 7-16 Final  . EGFR (African American) 11/15/2012 >60   Final  . EGFR (Non-African Amer.) 11/15/2012 >60   Final   Comment: eGFR values <20m/min/1.73 m2 may be an indication of chronic kidney disease (CKD). Calculated eGFR is useful in patients with stable renal function. The eGFR calculation will not be reliable in acutely ill patients when serum creatinine is changing rapidly. It is not useful in  patients on dialysis. The eGFR calculation may not be applicable to patients at the low and high extremes of body sizes, pregnant women, and vegetarians.   . Troponin-I 11/15/2012 < 0.02   Final   Comment: 0.00-0.05 0.05 ng/mL or less: NEGATIVE  Repeat testing in 3-6 hrs  if clinically indicated. >0.05 ng/mL: POTENTIAL  MYOCARDIAL INJURY. Repeat  testing in 3-6 hrs if  clinically indicated. NOTE: An increase or decrease  of 30%  or more on serial  testing suggests a  clinically important change   . CK, Total 11/15/2012 113  21-215 Unit/L Final  . CK-MB 11/15/2012 0.7  0.5-3.6 ng/mL Final  . Color - urine 11/15/2012 Amber   Final  . Clarity - urine 11/15/2012 Turbid   Final  . GSeward Meth12/07/2012 Negative  0-75 mg/dL Final  . Bilirubin,UR 11/15/2012 Negative  NEGATIVE Final  . Ketone 11/15/2012 Negative  NEGATIVE Final  . Specific Gravity 11/15/2012 1.028  1.003-1.030 Final  . Blood 11/15/2012 3+  NEGATIVE Final  . Ph 11/15/2012 6.0  4.5-8.0 Final  . Protein 11/15/2012 100 mg/dL  NEGATIVE Final  . Nitrite 11/15/2012 Negative  NEGATIVE Final  . Leukocyte Esterase 11/15/2012 Trace  NEGATIVE Final  . RBC,UR 11/15/2012 2793 /HPF  0-5 /HPF Final  . WBC UR 11/15/2012 7 /HPF  0-5 /HPF Final  . Bacteria 11/15/2012 NONE SEEN  NONE SEEN Final  . Squamous Epithelial 11/15/2012 2 /HPF   Final  . Mucous 11/15/2012 PRESENT   Final    Assessment:  TTanji Storrsis a 50y.o. female status post gastric bypass surgery approximately 10 years ago with a progressive microcytic anemia since 11/2013.  Iron studies are consistent with iron deficiency anemia (ferritin 6, iron satration 4%).  She takes oral B12.  Labs on 04/04/2015 revealed a normal B12, folate, and TSH.  She has been on oral iron for 1 week.  She denies any melena, hematochezia, or hematuria.  Menses are light.  Diet is good.  Symptomatically, she notes fatigue x 1 month.  Exam is unremarkable.  Plan: 1. Review diagnosis and management of iron deficiency.  Continue oral iron.  Discuss IV iron if unable to replete iron stores.  Side effects reviewed. 2. Labslip for [facility] labs (CBC with diff, ferritin, iron studies, retic) on 05/11/2015 3. Continue Ferralet 90. 4. Preauth Venofer. 5. Information on Venofer. 6. Patient to follow-up with GI. 7. RTC on 05/12/2015 for MD assess, review of labs, and possible Venofer.   MLequita Asal MD  04/20/2015

## 2015-12-12 ENCOUNTER — Encounter: Payer: Self-pay | Admitting: Hematology and Oncology

## 2015-12-12 NOTE — Progress Notes (Signed)
[facility] day:  05/12/2015  Chief Complaint: Stephanie Gates is a 50 y.o. female with history of gastric bypass surgery and severe iron deficiency anemia who is seen for reassessment on oral iron and possible initiation of IV iron.  HPI:   The patient was last seen in the hematology clinic on 04/20/2015.  At that time, she was seen for new patient assessment. She had undergone gastric bypass surgery approximately 10 years prior. He had a progressive microcytic anemia with iron studies from 03/2015 documenting iron deficiency. She denied any bleeding. Diet was good. She had just started oral iron one week prior.   The patient underwent EGD and colonoscopy on 05/04/2015 by Dr. Lucilla Lame.  There was ulceration at the jejunojejunal anastomosis.  There were a few non-bleeding cratered ulcers with no stigmata of bleeding distal to the gastrojejunal anastomosis. Biopsies revealed inflammation with focal erosion.  There was no evidence of malignancy.  Colonoscopy revealed one 6 mm polyp in the descending colon that was resected.  There were non-bleeding internal hemorrhoids.  Pathology revealed a tubular adenoma in the descending colon without high grade dysplasia or malignancy.  Labs on 05/10/2015 revealed a hematocrit of 34.7, hemoglobin 10.8, MCV 74, platelets 325,000, white count 11,100 with an ANC of 6100. Absolute lymphocyte count was 4100. Iron studies included a TIBC of 457 (high) and an iron saturation of 5%. Ferritin was 8 (low).  Reticulocyte count was 1.4%.  Symptomatically, she feels pretty good and "about the same".  She states that it hurts a little with the oral iron. She stopped her iron 1 week prior to her GI procedures.  Past Medical History  Diagnosis Date  . Anemia   . Allergy     seasonal  . Hypertension   . Arthritis     right hip  . Seasonal allergies   . Headache     sinus/allergies  . Vertigo     1-2x/month  . Wears  contact lenses     Past Surgical History  Procedure Laterality Date  . Gastric bypass surgery    . Foot surgery  2014  . Colonoscopy N/A 05/03/2015    Procedure: COLONOSCOPY;  Surgeon: Lucilla Lame, MD;  Location: [place];  Service: Gastroenterology;  Laterality: N/A;  . Esophagogastroduodenoscopy N/A 05/03/2015    Procedure: ESOPHAGOGASTRODUODENOSCOPY (EGD);  Surgeon: Lucilla Lame, MD;  Location: [place];  Service: Gastroenterology;  Laterality: N/A;  . Polypectomy  05/03/2015    Procedure: POLYPECTOMY INTESTINAL;  Surgeon: Lucilla Lame, MD;  Location: [place];  Service: Gastroenterology;;    Family History  Problem Relation Age of Onset  . Diabetes Mother   . Hypertension Mother   . Cancer Father   . Diabetes Father   . Hypertension Father     Social History:  reports that she has never smoked. She has never used smokeless tobacco. She reports that she does not drink alcohol or use illicit drugs.  The patient lives in [place].  The patient is accompanied by her daughter, Janeth Rase, today.  Allergies:  Allergies  Allergen Reactions  . Penicillin G Itching    Current Medications: Current Outpatient Prescriptions  Medication Sig Dispense Refill  . Cyanocobalamin (VITAMIN B-12 PO) Take by mouth.    . Fe Cbn-Fe Gluc-FA-B12-C-DSS (FERRALET 90) 90-1 MG TABS     . hydrochlorothiazide (MICROZIDE) 12.5 MG capsule Take by mouth. PM    . ibuprofen (ADVIL,MOTRIN) 800 MG tablet     .  Multiple Vitamin (MULTIVITAMIN) capsule Take 1 capsule by mouth daily.    . pseudoephedrine-acetaminophen (TYLENOL SINUS) 30-500 MG TABS Take 1 tablet by mouth every 4 (four) hours as needed.    . sucralfate (CARAFATE) 1 G tablet      No current facility-administered medications for this visit.    Review of Systems:  GENERAL:  Feels pretty good.  Still a little fatigued.  No fevers, sweats or weight loss. PERFORMANCE STATUS (ECOG):  1 HEENT:  Sees "dots".  No runny nose,  sore throat, mouth sores or tenderness. Lungs: No shortness of breath or cough.  No hemoptysis. Cardiac:  No chest pain, palpitations, orthopnea, or PND. GI:  Constipation.  No nausea, vomiting, diarrhea, melena or hematochezia.  Interval EGD and colonoscopy. GU:  No urgency, frequency, dysuria, or hematuria. Musculoskeletal:  No back pain.  No joint pain.  No muscle tenderness. Extremities:  No pain or swelling. Skin:  No rashes or skin changes. Neuro:  Sinus headaches.  No numbness or weakness, balance or coordination issues. Endocrine:  No diabetes, thyroid issues, hot flashes or night sweats. Psych:  No mood changes, depression or anxiety. Pain:  No focal pain. Review of systems:  All other systems reviewed and found to be negative.  Physical Exam: Blood pressure 144/82, pulse 75, temperature 98 F (36.7 C), temperature source Tympanic, height _0  (1.651 m), weight 276 lb 10.8 oz (125.499 kg), last menstrual period 04/16/2015. GENERAL:  Well developed, well nourished, woman sitting comfortably in the exam room in no acute distress. MENTAL STATUS:  Alert and oriented to person, place and time. HEAD:  Shoulder length brown hair.  Normocephalic, atraumatic, face symmetric, no Cushingoid features. EYES:  Glasses.  Brown eyes.  Pupils equal round and reactive to light and accomodation.  No conjunctivitis or scleral icterus. ENT:  Oropharynx clear without lesion.  Tongue normal. Mucous membranes moist.  RESPIRATORY:  Clear to auscultation without rales, wheezes or rhonchi. CARDIOVASCULAR:  Regular rate and rhythm without murmur, rub or gallop. ABDOMEN:  Soft, non-tender, with active bowel sounds, and no appreciable hepatosplenomegaly.  No masses. SKIN:  No rashes, ulcers or lesions. EXTREMITIES: No edema, no skin discoloration or tenderness.  No palpable cords. LYMPH NODES: No palpable cervical, supraclavicular, axillary or inguinal adenopathy  NEUROLOGICAL: Unremarkable. PSYCH:   Appropriate.  No visits with results within 3 Day(s) from this visit. Latest known visit with results is:  [facility] Conversion on 11/15/2012  Component Date Value Ref Range Status  . WBC 11/15/2012 13.6* 3.6-11.0 x10 3/mm 3 Final  . RBC 11/15/2012 4.70  3.80-5.20 X10 6/mm 3 Final  . HGB 11/15/2012 10.9* 12.0-16.0 g/dL Final  . HCT 11/15/2012 34.3* 35.0-47.0 % Final  . MCV 11/15/2012 73* 80-100 fL Final  . MCH 11/15/2012 23.1* 26.0-34.0 pg Final  . MCHC 11/15/2012 31.7* 32.0-36.0 g/dL Final  . RDW 11/15/2012 16.5* 11.5-14.5 % Final  . Platelet 11/15/2012 327  150-440 x10 3/mm 3 Final  . Glucose 11/15/2012 95  65-99 mg/dL Final  . BUN 11/15/2012 8  7-18 mg/dL Final  . Creatinine 11/15/2012 0.75  0.60-1.30 mg/dL Final  . Sodium 11/15/2012 140  136-145 mmol/L Final  . Potassium 11/15/2012 3.2* 3.5-5.1 mmol/L Final  . Chloride 11/15/2012 106  98-107 mmol/L Final  . Co2 11/15/2012 29  21-32 mmol/L Final  . Calcium, Total 11/15/2012 9.0  8.5-10.1 mg/dL Final  . SGOT(AST) 11/15/2012 24  15-37 Unit/L Final  . SGPT (ALT) 11/15/2012 29  12-78 U/L Final  . Alkaline Phosphatase  11/15/2012 112  50-136 Unit/L Final  . Albumin 11/15/2012 3.2* 3.4-5.0 g/dL Final  . Total Protein 11/15/2012 7.7  6.4-8.2 g/dL Final  . Bilirubin,Total 11/15/2012 0.4  0.2-1.0 mg/dL Final  . Osmolality 11/15/2012 278  275-301 Final  . Anion Gap 11/15/2012 5* 7-16 Final  . EGFR (African American) 11/15/2012 >60   Final  . EGFR (Non-African Amer.) 11/15/2012 >60   Final   Comment: eGFR values <2m/min/1.73 m2 may be an indication of chronic kidney disease (CKD). Calculated eGFR is useful in patients with stable renal function. The eGFR calculation will not be reliable in acutely ill patients when serum creatinine is changing rapidly. It is not useful in  patients on dialysis. The eGFR calculation may not be applicable to patients at the low and high extremes of body sizes, pregnant women, and vegetarians.   .  Troponin-I 11/15/2012 < 0.02   Final   Comment: 0.00-0.05 0.05 ng/mL or less: NEGATIVE  Repeat testing in 3-6 hrs  if clinically indicated. >0.05 ng/mL: POTENTIAL  MYOCARDIAL INJURY. Repeat  testing in 3-6 hrs if  clinically indicated. NOTE: An increase or decrease  of 30% or more on serial  testing suggests a  clinically important change   . CK, Total 11/15/2012 113  21-215 Unit/L Final  . CK-MB 11/15/2012 0.7  0.5-3.6 ng/mL Final  . Color - urine 11/15/2012 Amber   Final  . Clarity - urine 11/15/2012 Turbid   Final  . GSeward Meth12/07/2012 Negative  0-75 mg/dL Final  . Bilirubin,UR 11/15/2012 Negative  NEGATIVE Final  . Ketone 11/15/2012 Negative  NEGATIVE Final  . Specific Gravity 11/15/2012 1.028  1.003-1.030 Final  . Blood 11/15/2012 3+  NEGATIVE Final  . Ph 11/15/2012 6.0  4.5-8.0 Final  . Protein 11/15/2012 100 mg/dL  NEGATIVE Final  . Nitrite 11/15/2012 Negative  NEGATIVE Final  . Leukocyte Esterase 11/15/2012 Trace  NEGATIVE Final  . RBC,UR 11/15/2012 2793 /HPF  0-5 /HPF Final  . WBC UR 11/15/2012 7 /HPF  0-5 /HPF Final  . Bacteria 11/15/2012 NONE SEEN  NONE SEEN Final  . Squamous Epithelial 11/15/2012 2 /HPF   Final  . Mucous 11/15/2012 PRESENT   Final    Assessment:  TEdward Trevinois a 50y.o. female status post gastric bypass surgery approximately 10 years ago with a progressive microcytic anemia since 11/2013.  Iron studies are consistent with iron deficiency anemia (ferritin 6, iron satration 4%).  She takes oral B12.  Labs on 04/04/2015 revealed a normal B12, folate, and TSH.  She has been on oral iron for 3 weeks.  EGD and colonoscopy on 05/04/2015 revealed ulceration at the jejunojejunal anastomosis.  There were a few non-bleeding cratered ulcers with no stigmata of bleeding distal to the gastrojejunal anastomosis. Biopsies revealed inflammation with focal erosion.  Colonoscopy revealed one 6 mm polyp in the descending colon (tubular adenoma) and  non-bleeding internal hemorrhoids.  She denies any melena, hematochezia, or hematuria.  Menses are light.  Diet is good.  Symptomatically, she feels pretty good.  Exam is unremarkable.  Labs reveal stable mild anemia on oral iron.  Plan: 1. Review interval [facility] labs, EGD, and colonoscopy. 2. Continue Ferralet 90. 3. Recheck labs in 1 month.  If no significant improvement, begin IV iron. 4. RTC in 3 months for MD assess and review of labs.   MLequita Asal MD  05/12/2015

## 2016-01-02 ENCOUNTER — Inpatient Hospital Stay: Payer: 59 | Admitting: Hematology and Oncology

## 2016-01-16 ENCOUNTER — Inpatient Hospital Stay: Payer: 59

## 2016-01-23 ENCOUNTER — Inpatient Hospital Stay: Payer: 59 | Attending: Internal Medicine

## 2016-02-14 ENCOUNTER — Encounter: Payer: Self-pay | Admitting: Hematology and Oncology

## 2016-02-15 ENCOUNTER — Inpatient Hospital Stay: Payer: 59 | Attending: Hematology and Oncology | Admitting: Hematology and Oncology

## 2016-02-15 VITALS — BP 133/84 | HR 93 | Temp 98.7°F | Resp 18 | Ht 65.0 in | Wt 278.9 lb

## 2016-02-15 DIAGNOSIS — R42 Dizziness and giddiness: Secondary | ICD-10-CM | POA: Diagnosis not present

## 2016-02-15 DIAGNOSIS — D509 Iron deficiency anemia, unspecified: Secondary | ICD-10-CM | POA: Diagnosis not present

## 2016-02-15 DIAGNOSIS — I10 Essential (primary) hypertension: Secondary | ICD-10-CM | POA: Insufficient documentation

## 2016-02-15 DIAGNOSIS — M1990 Unspecified osteoarthritis, unspecified site: Secondary | ICD-10-CM | POA: Diagnosis not present

## 2016-02-15 DIAGNOSIS — Z79899 Other long term (current) drug therapy: Secondary | ICD-10-CM | POA: Diagnosis not present

## 2016-02-15 DIAGNOSIS — Z9884 Bariatric surgery status: Secondary | ICD-10-CM

## 2016-02-15 DIAGNOSIS — D124 Benign neoplasm of descending colon: Secondary | ICD-10-CM | POA: Insufficient documentation

## 2016-02-15 DIAGNOSIS — K648 Other hemorrhoids: Secondary | ICD-10-CM | POA: Diagnosis not present

## 2016-02-15 NOTE — Progress Notes (Signed)
[facility] day:  02/15/2016  Chief Complaint: Stephanie Gates is a 50 y.o. female with history of gastric bypass surgery and severe iron deficiency anemia who is seen for 8 month reassessment.  HPI: The patient was last seen in the medical oncology clinic on 07/07/2015.  At that time, she had more energy.  She felt very good but was "not 100%".  Ice pica had resolved.  She had received Venofer 600 mg (06/16/2015 - 06/30/2015).  Labs on 07/06/2015 revealed a hematocrit of 36.3, hemoglobin 11.7, MCV 76, platelets 288,000, white count 11,000 with an ANC of 6200.  Ferritin was 150.    During the interim,  She notes being tired more than usual.  Her diet is "ok'.  She eats meat daily.  She denies any ice pica.  Menses is heavy with more cramps.  She is not taking oral iron.  Labs from [place] on 02/13/2016 revealed a hematocrit of 37.4, hemoglobin 12.3, MCV 81, platelets 299,000, WBC 10,800 with an ANC of 6500.  Absolute lymphocyte count was 3300 [phone]).  B12 was 1378.  Folate was > 20.  Ferritin was 21.  Iron saturation was 12% (low) with a TIBC of 379 (normal).  Past Medical History  Diagnosis Date  . Anemia   . Allergy     seasonal  . Hypertension   . Arthritis     right hip  . Seasonal allergies   . Headache     sinus/allergies  . Vertigo     1-2x/month  . Wears contact lenses     Past Surgical History  Procedure Laterality Date  . Gastric bypass surgery    . Foot surgery  2014  . Colonoscopy N/A 05/03/2015    Procedure: COLONOSCOPY;  Surgeon: Lucilla Lame, MD;  Location: [place];  Service: Gastroenterology;  Laterality: N/A;  . Esophagogastroduodenoscopy N/A 05/03/2015    Procedure: ESOPHAGOGASTRODUODENOSCOPY (EGD);  Surgeon: Lucilla Lame, MD;  Location: [place];  Service: Gastroenterology;  Laterality: N/A;  . Polypectomy  05/03/2015    Procedure: POLYPECTOMY INTESTINAL;  Surgeon: Lucilla Lame, MD;   Location: [place];  Service: Gastroenterology;;    Family History  Problem Relation Age of Onset  . Diabetes Mother   . Hypertension Mother   . Cancer Father   . Diabetes Father   . Hypertension Father     Social History:  reports that she has never smoked. She has never used smokeless tobacco. She reports that she does not drink alcohol or use illicit drugs.  The patient is alone today.  Allergies:  Allergies  Allergen Reactions  . Penicillin G Itching    Current Medications: Current Outpatient Prescriptions  Medication Sig Dispense Refill  . Cyanocobalamin (VITAMIN B-12 PO) Take by mouth.    . hydrochlorothiazide (MICROZIDE) 12.5 MG capsule Take by mouth. PM    . ibuprofen (ADVIL,MOTRIN) 800 MG tablet     . Multiple Vitamin (MULTIVITAMIN) capsule Take 1 capsule by mouth daily.    . Phendimetrazine Tartrate 105 MG CP24 Take 1 capsule by mouth daily.     No current facility-administered medications for this visit.    Review of Systems:  GENERAL:  Feels more tired.  Energy level is down.  No fevers or sweats.  Weight down 5 pounds. PERFORMANCE STATUS (ECOG):  1 HEENT:  No visual changes, runny nose, sore throat, mouth sores or tenderness. Lungs: No shortness of breath or cough.  No hemoptysis. Cardiac:  No chest pain, palpitations, orthopnea, or PND. GI:  No nausea, vomiting, diarrhea, constipation, melena or hematochezia.  No pica. GU:  No urgency, frequency, dysuria, or hematuria.  Heavy menses. Musculoskeletal:  No back pain.  No joint pain.  No muscle tenderness. Extremities:  No pain or swelling. Skin:  No rashes or skin changes. Neuro:  No headache, numbness or weakness, balance or coordination issues. Endocrine:  No diabetes, thyroid issues, hot flashes or night sweats. Psych:  No mood changes, depression or anxiety. Pain:  No focal pain. Review of systems:  All other systems reviewed and found to be negative.  Physical Exam: Blood pressure 133/84,  pulse 93, temperature 98.7 F (37.1 C), temperature source Tympanic, resp. rate 18, height _0  (1.651 m), weight 278 lb 14.1 oz (126.5 kg). GENERAL:  Well developed, well nourished, heavyset woman sitting comfortably in the exam room in no acute distress. MENTAL STATUS:  Alert and oriented to person, place and time. HEAD:  Shoulder length dark brown hair.  Normocephalic, atraumatic, face symmetric, no Cushingoid features. EYES:  Brown eyes.  Pupils equal round and reactive to light and accomodation.  No conjunctivitis or scleral icterus. ENT:  Oropharynx clear without lesion.  Tongue normal. Mucous membranes moist.  RESPIRATORY:  Clear to auscultation without rales, wheezes or rhonchi. CARDIOVASCULAR:  Regular rate and rhythm without murmur, rub or gallop. ABDOMEN:  Soft, non-tender, with active bowel sounds, and no appreciable hepatosplenomegaly.  No masses. SKIN:  No rashes, ulcers or lesions. EXTREMITIES: No edema, no skin discoloration or tenderness.  No palpable cords. LYMPH NODES: No palpable cervical, supraclavicular, axillary or inguinal adenopathy  NEUROLOGICAL: Unremarkable. PSYCH:  Appropriate.  No visits with results within 3 Day(s) from this visit. Latest known visit with results is:  [facility] Conversion on 11/15/2012  Component Date Value Ref Range Status  . WBC 11/15/2012 13.6* 3.6-11.0 x10 3/mm 3 Final  . RBC 11/15/2012 4.70  3.80-5.20 X10 6/mm 3 Final  . HGB 11/15/2012 10.9* 12.0-16.0 g/dL Final  . HCT 11/15/2012 34.3* 35.0-47.0 % Final  . MCV 11/15/2012 73* 80-100 fL Final  . MCH 11/15/2012 23.1* 26.0-34.0 pg Final  . MCHC 11/15/2012 31.7* 32.0-36.0 g/dL Final  . RDW 11/15/2012 16.5* 11.5-14.5 % Final  . Platelet 11/15/2012 327  150-440 x10 3/mm 3 Final  . Glucose 11/15/2012 95  65-99 mg/dL Final  . BUN 11/15/2012 8  7-18 mg/dL Final  . Creatinine 11/15/2012 0.75  0.60-1.30 mg/dL Final  . Sodium 11/15/2012 140  136-145 mmol/L Final  . Potassium 11/15/2012 3.2*  3.5-5.1 mmol/L Final  . Chloride 11/15/2012 106  98-107 mmol/L Final  . Co2 11/15/2012 29  21-32 mmol/L Final  . Calcium, Total 11/15/2012 9.0  8.5-10.1 mg/dL Final  . SGOT(AST) 11/15/2012 24  15-37 Unit/L Final  . SGPT (ALT) 11/15/2012 29  12-78 U/L Final  . Alkaline Phosphatase 11/15/2012 112  50-136 Unit/L Final  . Albumin 11/15/2012 3.2* 3.4-5.0 g/dL Final  . Total Protein 11/15/2012 7.7  6.4-8.2 g/dL Final  . Bilirubin,Total 11/15/2012 0.4  0.2-1.0 mg/dL Final  . Osmolality 11/15/2012 278  275-301 Final  . Anion Gap 11/15/2012 5* 7-16 Final  . EGFR (African American) 11/15/2012 >60   Final  . EGFR (Non-African Amer.) 11/15/2012 >60   Final   Comment: eGFR values <58m/min/1.73 m2 may be an indication of chronic kidney disease (CKD). Calculated eGFR is useful in patients with stable renal function. The eGFR calculation will not be reliable in acutely ill patients when serum creatinine is  changing rapidly. It is not useful in  patients on dialysis. The eGFR calculation may not be applicable to patients at the low and high extremes of body sizes, pregnant women, and vegetarians.   . Troponin-I 11/15/2012 < 0.02   Final   Comment: 0.00-0.05 0.05 ng/mL or less: NEGATIVE  Repeat testing in 3-6 hrs  if clinically indicated. >0.05 ng/mL: POTENTIAL  MYOCARDIAL INJURY. Repeat  testing in 3-6 hrs if  clinically indicated. NOTE: An increase or decrease  of 30% or more on serial  testing suggests a  clinically important change   . CK, Total 11/15/2012 113  21-215 Unit/L Final  . CK-MB 11/15/2012 0.7  0.5-3.6 ng/mL Final  . Color - urine 11/15/2012 Amber   Final  . Clarity - urine 11/15/2012 Turbid   Final  . [place] Meth 11/15/2012 Negative  0-75 mg/dL Final  . Bilirubin,UR 11/15/2012 Negative  NEGATIVE Final  . Ketone 11/15/2012 Negative  NEGATIVE Final  . Specific Gravity 11/15/2012 1.028  1.003-1.030 Final  . Blood 11/15/2012 3+  NEGATIVE Final  . Ph 11/15/2012 6.0  4.5-8.0  Final  . Protein 11/15/2012 100 mg/dL  NEGATIVE Final  . Nitrite 11/15/2012 Negative  NEGATIVE Final  . Leukocyte Esterase 11/15/2012 Trace  NEGATIVE Final  . RBC,UR 11/15/2012 2793 /HPF  0-5 /HPF Final  . WBC UR 11/15/2012 7 /HPF  0-5 /HPF Final  . Bacteria 11/15/2012 NONE SEEN  NONE SEEN Final  . Squamous Epithelial 11/15/2012 2 /HPF   Final  . Mucous 11/15/2012 PRESENT   Final    Assessment:  Stephanie Gates is a 50 y.o. female status post gastric bypass surgery approximately 10 years ago with a progressive microcytic anemia since 11/2013. Iron studies are consistent with iron deficiency anemia (ferritin 6, iron satration 4%). She takes oral B12. Labs on 04/04/2015 revealed a normal B12, folate, and TSH. She denies any melena, hematochezia, or hematuria. Diet is good.  She was on oral iron for 2 months without significant improvement.  EGD and colonoscopy on 05/04/2015 revealed ulceration at the jejunojejunal anastomosis. There were a few non-bleeding cratered ulcers with no stigmata of bleeding distal to the gastrojejunal anastomosis. Biopsies revealed inflammation with focal erosion. Colonoscopy revealed one 6 mm polyp in the descending colon (tubular adenoma) and non-bleeding internal hemorrhoids.  She notes menses heavy with more cramps.  She received IV Venofer from 06/16/2015 - 06/30/2015.  Ferritin goal is 100 with consideration of treatment if < 50.  Labs on 07/06/2015 revealed a hematocrit of 36.3, hemoglobin 11.7, and MCV 76. Ferritin was 150.  Labs on 02/13/2016 revealed a hematocrit of 37.4, hemoglobin 12.3, MCV 81, platelets 299,000, WBC 10,800 with an ANC of 6500.  Absolute lymphocyte count was 3300 [phone]).  B12 was 1378.  Folate was > 20.  Ferritin was 21.  Symptomatically, she feels is more fatigued. Exam is unremarkable.  Hematocrit is normal.  Ferritin has decreased from 150 to 21.   Plan: 1.  Review interval [facility] labs. 2.  Schedule Venofer. 3.   Confirm active preauth. 4.  [facility] slips for 3 and 6 months. 5.  RTC in 6 months for MD assess and review of [facility] labs.    Lequita Asal, MD  02/15/2016, 3:56 PM

## 2016-02-22 ENCOUNTER — Inpatient Hospital Stay: Payer: 59

## 2016-02-22 VITALS — BP 124/80 | HR 87 | Temp 97.5°F | Resp 18

## 2016-02-22 DIAGNOSIS — D509 Iron deficiency anemia, unspecified: Secondary | ICD-10-CM

## 2016-02-22 MED ORDER — IRON SUCROSE 20 MG/ML IV SOLN
200.0000 mg | Freq: Once | INTRAVENOUS | Status: AC
  Administered 2016-02-22: 200 mg via INTRAVENOUS
  Filled 2016-02-22: qty 10

## 2016-02-22 MED ORDER — SODIUM CHLORIDE 0.9 % IV SOLN
Freq: Once | INTRAVENOUS | Status: AC
  Administered 2016-02-22: 14:00:00 via INTRAVENOUS
  Filled 2016-02-22: qty 1000

## 2016-03-04 ENCOUNTER — Encounter: Payer: Self-pay | Admitting: Hematology and Oncology

## 2016-06-03 ENCOUNTER — Encounter: Payer: Self-pay | Admitting: Hematology and Oncology

## 2016-06-13 ENCOUNTER — Other Ambulatory Visit: Payer: Self-pay | Admitting: Hematology and Oncology

## 2016-06-14 ENCOUNTER — Telehealth: Payer: Self-pay

## 2016-06-14 NOTE — Telephone Encounter (Signed)
Called pt left VM that she would need venofer x 2 per MD.  Call us back with any questions, and that I messaged scheduling to call pt and set up.

## 2016-06-14 NOTE — Telephone Encounter (Signed)
-----   Message from Lequita Asal, MD sent at 06/13/2016  9:33 PM EDT ----- Regarding: Please call patient  Ferritin 20.  Hematocrit 35.9 with low MCV.  Please set up Venofer x 2.  M

## 2016-06-17 ENCOUNTER — Other Ambulatory Visit: Payer: Self-pay | Admitting: Hematology and Oncology

## 2016-06-18 ENCOUNTER — Inpatient Hospital Stay: Payer: 59 | Attending: Hematology and Oncology

## 2016-06-18 VITALS — BP 146/63 | HR 79 | Temp 97.6°F | Resp 18

## 2016-06-18 DIAGNOSIS — D509 Iron deficiency anemia, unspecified: Secondary | ICD-10-CM | POA: Insufficient documentation

## 2016-06-18 DIAGNOSIS — Z79899 Other long term (current) drug therapy: Secondary | ICD-10-CM | POA: Insufficient documentation

## 2016-06-18 MED ORDER — SODIUM CHLORIDE 0.9 % IV SOLN
Freq: Once | INTRAVENOUS | Status: AC
  Administered 2016-06-18: 14:00:00 via INTRAVENOUS
  Filled 2016-06-18: qty 1000

## 2016-06-18 MED ORDER — SODIUM CHLORIDE 0.9 % IV SOLN
200.0000 mg | Freq: Once | INTRAVENOUS | Status: AC
  Administered 2016-06-18: 200 mg via INTRAVENOUS
  Filled 2016-06-18: qty 10

## 2016-06-25 ENCOUNTER — Inpatient Hospital Stay: Payer: 59

## 2016-06-25 ENCOUNTER — Other Ambulatory Visit: Payer: Self-pay | Admitting: Hematology and Oncology

## 2016-06-25 VITALS — BP 140/82 | HR 73 | Temp 97.9°F | Resp 20

## 2016-06-25 DIAGNOSIS — D509 Iron deficiency anemia, unspecified: Secondary | ICD-10-CM | POA: Diagnosis not present

## 2016-06-25 MED ORDER — SODIUM CHLORIDE 0.9 % IV SOLN
Freq: Once | INTRAVENOUS | Status: AC
  Administered 2016-06-25: 14:00:00 via INTRAVENOUS
  Filled 2016-06-25: qty 1000

## 2016-06-25 MED ORDER — SODIUM CHLORIDE 0.9 % IV SOLN
200.0000 mg | Freq: Once | INTRAVENOUS | Status: AC
  Administered 2016-06-25: 200 mg via INTRAVENOUS
  Filled 2016-06-25: qty 10

## 2016-06-28 ENCOUNTER — Other Ambulatory Visit: Payer: Self-pay | Admitting: Hematology and Oncology

## 2016-08-19 ENCOUNTER — Inpatient Hospital Stay: Payer: 59 | Admitting: Hematology and Oncology

## 2016-08-30 ENCOUNTER — Encounter: Payer: Self-pay | Admitting: Hematology and Oncology

## 2016-08-30 ENCOUNTER — Inpatient Hospital Stay: Payer: 59 | Attending: Hematology and Oncology | Admitting: Hematology and Oncology

## 2016-08-30 VITALS — BP 163/91 | HR 93 | Temp 98.0°F | Ht 65.0 in | Wt 296.7 lb

## 2016-08-30 DIAGNOSIS — D509 Iron deficiency anemia, unspecified: Secondary | ICD-10-CM | POA: Insufficient documentation

## 2016-08-30 DIAGNOSIS — Z79899 Other long term (current) drug therapy: Secondary | ICD-10-CM

## 2016-08-30 DIAGNOSIS — Z9884 Bariatric surgery status: Secondary | ICD-10-CM | POA: Diagnosis not present

## 2016-08-30 DIAGNOSIS — N920 Excessive and frequent menstruation with regular cycle: Secondary | ICD-10-CM | POA: Diagnosis not present

## 2016-08-30 DIAGNOSIS — I10 Essential (primary) hypertension: Secondary | ICD-10-CM | POA: Insufficient documentation

## 2016-08-30 DIAGNOSIS — M1990 Unspecified osteoarthritis, unspecified site: Secondary | ICD-10-CM | POA: Insufficient documentation

## 2016-08-30 NOTE — Progress Notes (Signed)
Patient here for follow up. No changes since last visit.   

## 2016-08-30 NOTE — Progress Notes (Signed)
[facility] day:  08/30/16  Chief Complaint: Stephanie Gates is a 50 y.o. female with history of gastric bypass surgery and severe iron deficiency anemia who is seen for 6 month assessment.  HPI: The patient was last seen in the medical oncology clinic on 02/15/2016.  At that time, she felt more fatigued. Exam was unremarkable.  Hematocrit was normal.  Ferritin had decreased from 150 to 21.   She received Venofer 200 mg on 02/22/2016, 06/18/2016, and 06/25/2016.  She felt better.  She has had no further ice pica.  She denies any bleeding.  Labs from [place] on 08/30/2016 revealed a hematocrit of 37.4, hemoglobin 12.8, MCV 78, platelets 293,000, WBC 10,500 with an ANC of 6300.  Absolute lymphocyte count was 3200 ([phone]).  Ferritin and iron studies are pending.  Symptomatically, she notes "everything is good".  Her diet is good.  She notes heavy menses every 3 months.  She has hot flashes.  Past Medical History:  Diagnosis Date  . Allergy    seasonal  . Anemia   . Arthritis    right hip  . Headache    sinus/allergies  . Hypertension   . Seasonal allergies   . Vertigo    1-2x/month  . Wears contact lenses     Past Surgical History:  Procedure Laterality Date  . COLONOSCOPY N/A 05/03/2015   Procedure: COLONOSCOPY;  Surgeon: Lucilla Lame, MD;  Location: [place];  Service: Gastroenterology;  Laterality: N/A;  . ESOPHAGOGASTRODUODENOSCOPY N/A 05/03/2015   Procedure: ESOPHAGOGASTRODUODENOSCOPY (EGD);  Surgeon: Lucilla Lame, MD;  Location: [place];  Service: Gastroenterology;  Laterality: N/A;  . FOOT SURGERY  2014  . Gastric bypass surgery    . POLYPECTOMY  05/03/2015   Procedure: POLYPECTOMY INTESTINAL;  Surgeon: Lucilla Lame, MD;  Location: [place];  Service: Gastroenterology;;    Family History  Problem Relation Age of Onset  . Diabetes Mother   . Hypertension Mother   . Cancer Father   .  Diabetes Father   . Hypertension Father     Social History:  reports that she has never smoked. She has never used smokeless tobacco. She reports that she does not drink alcohol or use drugs.  The patient is alone today.  Allergies:  Allergies  Allergen Reactions  . Penicillin G Itching    Current Medications: Current Outpatient Prescriptions  Medication Sig Dispense Refill  . Cyanocobalamin (VITAMIN B-12 PO) Take by mouth.    . hydrochlorothiazide (MICROZIDE) 12.5 MG capsule Take by mouth. PM    . ibuprofen (ADVIL,MOTRIN) 800 MG tablet     . Multiple Vitamin (MULTIVITAMIN) capsule Take 1 capsule by mouth daily.    . pantoprazole (PROTONIX) 40 MG tablet     . Phendimetrazine Tartrate 105 MG CP24 Take 1 capsule by mouth daily.     No current facility-administered medications for this visit.     Review of Systems:  GENERAL:  Feels good.  No fevers or sweats.  Weight up 18 pounds. PERFORMANCE STATUS (ECOG):  1 HEENT:  No visual changes, runny nose, sore throat, mouth sores or tenderness. Lungs: No shortness of breath or cough.  No hemoptysis. Cardiac:  No chest pain, palpitations, orthopnea, or PND. GI:  No nausea, vomiting, diarrhea, constipation, melena or hematochezia.  No pica. GU:  No urgency, frequency, dysuria, or hematuria.  Menses every 3 months. Musculoskeletal:  No back pain.  No joint pain.  No muscle tenderness. Extremities:  No pain or swelling. Skin:  No rashes or skin changes. Neuro:  No headache, numbness or weakness, balance or coordination issues. Endocrine:  Hot flashes.  No diabetes, thyroid issues, or night sweats. Psych:  No mood changes, depression or anxiety. Pain:  No focal pain. Review of systems:  All other systems reviewed and found to be negative.  Physical Exam: Blood pressure (!) 163/91, pulse 93, temperature 98 F (36.7 C), temperature source Oral, height 5' 5" (1.651 m), weight 296 lb 11.8 oz (134.6 kg). GENERAL:  Well developed, well  nourished, heavyset woman sitting comfortably in the exam room in no acute distress. MENTAL STATUS:  Alert and oriented to person, place and time. HEAD:  Shoulder length dark brown hair.  Normocephalic, atraumatic, face symmetric, no Cushingoid features. EYES:  Brown eyes.  Pupils equal round and reactive to light and accomodation.  No conjunctivitis or scleral icterus. ENT:  Oropharynx clear without lesion.  Tongue normal. Mucous membranes moist.  RESPIRATORY:  Clear to auscultation without rales, wheezes or rhonchi. CARDIOVASCULAR:  Regular rate and rhythm without murmur, rub or gallop. ABDOMEN:  Soft, non-tender, with active bowel sounds, and no appreciable hepatosplenomegaly.  No masses. SKIN:  No rashes, ulcers or lesions. EXTREMITIES: No edema, no skin discoloration or tenderness.  No palpable cords. LYMPH NODES: No palpable cervical, supraclavicular, axillary or inguinal adenopathy  NEUROLOGICAL: Unremarkable. PSYCH:  Appropriate.   No visits with results within 3 Day(s) from this visit.  Latest known visit with results is:  [facility] Conversion on 11/15/2012  Component Date Value Ref Range Status  . WBC 11/15/2012 13.6* 3.6 - 11.0 x10 3/mm 3 Final  . RBC 11/15/2012 4.70  3.80 - 5.20 X10 6/mm 3 Final  . HGB 11/15/2012 10.9* 12.0 - 16.0 g/dL Final  . HCT 11/15/2012 34.3* 35.0 - 47.0 % Final  . MCV 11/15/2012 73* 80 - 100 fL Final  . MCH 11/15/2012 23.1* 26.0 - 34.0 pg Final  . MCHC 11/15/2012 31.7* 32.0 - 36.0 g/dL Final  . RDW 11/15/2012 16.5* 11.5 - 14.5 % Final  . Platelet 11/15/2012 327  150 - 440 x10 3/mm 3 Final  . Glucose 11/15/2012 95  65 - 99 mg/dL Final  . BUN 11/15/2012 8  7 - 18 mg/dL Final  . Creatinine 11/15/2012 0.75  0.60 - 1.30 mg/dL Final  . Sodium 11/15/2012 140  136 - 145 mmol/L Final  . Potassium 11/15/2012 3.2* 3.5 - 5.1 mmol/L Final  . Chloride 11/15/2012 106  98 - 107 mmol/L Final  . Co2 11/15/2012 29  21 - 32 mmol/L Final  . Calcium, Total 11/15/2012 9.0   8.5 - 10.1 mg/dL Final  . SGOT(AST) 11/15/2012 24  15 - 37 Unit/L Final  . SGPT (ALT) 11/15/2012 29  12 - 78 U/L Final  . Alkaline Phosphatase 11/15/2012 112  50 - 136 Unit/L Final  . Albumin 11/15/2012 3.2* 3.4 - 5.0 g/dL Final  . Total Protein 11/15/2012 7.7  6.4 - 8.2 g/dL Final  . Bilirubin,Total 11/15/2012 0.4  0.2 - 1.0 mg/dL Final  . Osmolality 11/15/2012 278  275 - 301 Final  . Anion Gap 11/15/2012 5* 7 - 16 Final  . EGFR (African American) 11/15/2012 >60   Final  . EGFR (Non-African Amer.) 11/15/2012 >60   Final   Comment: eGFR values <19m/min/1.73 m2 may be an indication of chronic kidney disease (CKD). Calculated eGFR is useful in patients with stable renal function. The eGFR calculation will not be reliable in acutely ill  patients when serum creatinine is changing rapidly. It is not useful in  patients on dialysis. The eGFR calculation may not be applicable to patients at the low and high extremes of body sizes, pregnant women, and vegetarians.   . Troponin-I 11/15/2012 < 0.02  ng/mL Final   Comment: 0.00-0.05 0.05 ng/mL or less: NEGATIVE  Repeat testing in 3-6 hrs  if clinically indicated. >0.05 ng/mL: POTENTIAL  MYOCARDIAL INJURY. Repeat  testing in 3-6 hrs if  clinically indicated. NOTE: An increase or decrease  of 30% or more on serial  testing suggests a  clinically important change   . CK, Total 11/15/2012 113  21 - 215 Unit/L Final  . CK-MB 11/15/2012 0.7  0.5 - 3.6 ng/mL Final  . Color - urine 11/15/2012 Amber   Final  . Clarity - urine 11/15/2012 Turbid   Final  . [place] Meth 11/15/2012 Negative  0 - 75 mg/dL Final  . Bilirubin,UR 11/15/2012 Negative  NEGATIVE Final  . Ketone 11/15/2012 Negative  NEGATIVE Final  . Specific Gravity 11/15/2012 1.028  1.003 - 1.030 Final  . Blood 11/15/2012 3+  NEGATIVE Final  . Ph 11/15/2012 6.0  4.5 - 8.0 Final  . Protein 11/15/2012 100 mg/dL  NEGATIVE Final  . Nitrite 11/15/2012 Negative  NEGATIVE Final  . Leukocyte  Esterase 11/15/2012 Trace  NEGATIVE Final  . RBC,UR 11/15/2012 2793 /HPF  0 - 5 /HPF Final  . WBC UR 11/15/2012 7 /HPF  0 - 5 /HPF Final  . Bacteria 11/15/2012 NONE SEEN  NONE SEEN Final  . Squamous Epithelial 11/15/2012 2 /HPF   Final  . Mucous 11/15/2012 PRESENT   Final   [facility] labs: CBC  On 08/30/2016 revealed a hematocrit of 37.4, hemoglobin 12.8, MCV 78, platelets 293,000, WBC 10,500 with an [place] of 6300.  Absolute lymphocyte count was 3200 [phone]).  Ferritin and iron studies are pending.   Assessment:  Stephanie Gates is a 50 y.o. female status post gastric bypass surgery approximately 10 years ago with a progressive microcytic anemia since 11/2013. Iron studies are consistent with iron deficiency anemia (ferritin 6, iron satration 4%). She takes oral B12. Labs on 04/04/2015 revealed a normal B12, folate, and TSH. She denies any melena, hematochezia, or hematuria. Diet is good.  She was on oral iron for 2 months without significant improvement.  EGD and colonoscopy on 05/04/2015 revealed ulceration at the jejunojejunal anastomosis. There were a few non-bleeding cratered ulcers with no stigmata of bleeding distal to the gastrojejunal anastomosis. Biopsies revealed inflammation with focal erosion. Colonoscopy revealed one 6 mm polyp in the descending colon (tubular adenoma) and non-bleeding internal hemorrhoids.  She notes menses heavy with more cramps.  She received Venofer from 06/16/2015 - 06/30/2015, 02/22/2016, and 06/18/2016 - 06/25/2016.  Ferritin goal is 100 with consideration of treatment if < 50.  Labs on 07/06/2015 revealed a hematocrit of 36.3, hemoglobin 11.7, and MCV 76. Ferritin was 150.  Labs on 02/13/2016 revealed a hematocrit of 37.4, hemoglobin 12.3, MCV 81, platelets 299,000, WBC 10,800 with an ANC of 6500.  Absolute lymphocyte count was 3300 [phone]).  B12 was 1378.  Folate was > 20.  Ferritin was 21.  Symptomatically, she feels good. She is  perimenopausal with heavy menses every 3 months.  Exam is unremarkable.  Hematocrit is normal.  MCV is low suggesting iron deficiency.  Plan: 1.  Review interval [facility] labs. 2.  Contact patient regarding ferritin and need for Venofer. 3.  [facility] slips for 3 and 6 months.  4.  RTC in 6 months for MD assess, review of [facility] labs, and +/- Venofer.    Lequita Asal, MD  08/30/2016, 4:44 PM

## 2016-08-31 ENCOUNTER — Encounter: Payer: Self-pay | Admitting: Hematology and Oncology

## 2016-11-22 ENCOUNTER — Encounter: Payer: Self-pay | Admitting: Hematology and Oncology

## 2016-12-05 ENCOUNTER — Telehealth: Payer: Self-pay | Admitting: *Deleted

## 2016-12-05 NOTE — Telephone Encounter (Addendum)
Asking if we received her labs and if they are alright. Please return her call [phone]

## 2016-12-06 ENCOUNTER — Telehealth: Payer: Self-pay | Admitting: *Deleted

## 2016-12-06 NOTE — Telephone Encounter (Signed)
Please make patient an appointment for venofer infusion next week, would like to come at 3 pm if possible.  Please call patient with appointment.

## 2016-12-06 NOTE — Telephone Encounter (Signed)
Called patient to discuss ferritin is low and need for venofer, voiced understanding and is willing to come into clinic for medication.  Scheduler notified to make pt an appointment and call patient.

## 2016-12-11 ENCOUNTER — Inpatient Hospital Stay: Payer: 59 | Attending: Hematology and Oncology

## 2016-12-11 VITALS — BP 118/76 | HR 78 | Temp 98.4°F | Resp 18

## 2016-12-11 DIAGNOSIS — M1990 Unspecified osteoarthritis, unspecified site: Secondary | ICD-10-CM | POA: Diagnosis not present

## 2016-12-11 DIAGNOSIS — D509 Iron deficiency anemia, unspecified: Secondary | ICD-10-CM | POA: Diagnosis present

## 2016-12-11 DIAGNOSIS — I10 Essential (primary) hypertension: Secondary | ICD-10-CM | POA: Insufficient documentation

## 2016-12-11 DIAGNOSIS — Z79899 Other long term (current) drug therapy: Secondary | ICD-10-CM | POA: Insufficient documentation

## 2016-12-11 DIAGNOSIS — Z9884 Bariatric surgery status: Secondary | ICD-10-CM | POA: Insufficient documentation

## 2016-12-11 DIAGNOSIS — N920 Excessive and frequent menstruation with regular cycle: Secondary | ICD-10-CM | POA: Insufficient documentation

## 2016-12-11 MED ORDER — SODIUM CHLORIDE 0.9 % IV SOLN
Freq: Once | INTRAVENOUS | Status: AC
  Administered 2016-12-11: 15:00:00 via INTRAVENOUS
  Filled 2016-12-11: qty 1000

## 2016-12-11 MED ORDER — SODIUM CHLORIDE 0.9 % IV SOLN: 200.0000 mg | Freq: Once | INTRAVENOUS | Status: DC

## 2016-12-11 MED ORDER — IRON SUCROSE 20 MG/ML IV SOLN
200.0000 mg | Freq: Once | INTRAVENOUS | Status: AC
  Administered 2016-12-11: 200 mg via INTRAVENOUS
  Filled 2016-12-11: qty 10

## 2016-12-24 ENCOUNTER — Other Ambulatory Visit: Payer: Self-pay | Admitting: Internal Medicine

## 2016-12-24 DIAGNOSIS — Z1231 Encounter for screening mammogram for malignant neoplasm of breast: Secondary | ICD-10-CM

## 2017-02-04 ENCOUNTER — Ambulatory Visit: Payer: 59 | Attending: Internal Medicine

## 2017-02-25 ENCOUNTER — Encounter: Payer: Self-pay | Admitting: Hematology and Oncology

## 2017-02-27 ENCOUNTER — Encounter: Payer: Self-pay | Admitting: Hematology and Oncology

## 2017-02-27 ENCOUNTER — Inpatient Hospital Stay: Payer: 59 | Attending: Hematology and Oncology | Admitting: Hematology and Oncology

## 2017-02-27 ENCOUNTER — Inpatient Hospital Stay: Payer: 59

## 2017-02-27 VITALS — BP 136/83 | HR 86 | Temp 97.5°F | Resp 18 | Wt 303.2 lb

## 2017-02-27 DIAGNOSIS — K648 Other hemorrhoids: Secondary | ICD-10-CM | POA: Diagnosis not present

## 2017-02-27 DIAGNOSIS — Z79899 Other long term (current) drug therapy: Secondary | ICD-10-CM

## 2017-02-27 DIAGNOSIS — I10 Essential (primary) hypertension: Secondary | ICD-10-CM | POA: Insufficient documentation

## 2017-02-27 DIAGNOSIS — Z9884 Bariatric surgery status: Secondary | ICD-10-CM | POA: Diagnosis not present

## 2017-02-27 DIAGNOSIS — D509 Iron deficiency anemia, unspecified: Secondary | ICD-10-CM | POA: Insufficient documentation

## 2017-02-27 DIAGNOSIS — D124 Benign neoplasm of descending colon: Secondary | ICD-10-CM | POA: Insufficient documentation

## 2017-02-27 DIAGNOSIS — N920 Excessive and frequent menstruation with regular cycle: Secondary | ICD-10-CM | POA: Insufficient documentation

## 2017-02-27 NOTE — Progress Notes (Signed)
Patient offers no complaints today.  States she does get dizzy off and on.

## 2017-02-27 NOTE — Progress Notes (Signed)
[facility] day:  02/27/17  Chief Complaint: Stephanie Gates is a 51 y.o. female with history of gastric bypass surgery and severe iron deficiency anemia who is seen for 6 month assessment.  HPI: The patient was last seen in the medical oncology clinic on 08/30/2016.  At that time, she feelt good. She was perimenopausal with heavy menses every 3 months.  Hematocrit was normal.  MCV was low suggesting iron deficiency.  Labs from [place] on 11/21/2016 revealed a hematocrit of 38.4, hemoglobin 12.5, and MCV 81.  Ferritin was 32.  She received Venofer 200 mg on 12/11/2016.  Labs from [place] on 02/25/2017 revealed a hematocrit of 39.3, hemoglobin 12.7, MCV 82, platelets 345,000, WBC 11,500 with an ANC of 7300.  Absolute lymphocyte count was 3200 ([phone]).  Ferritin was 59.   Symptomatically, she feels "pretty good".  Her diet is "so so".  He denies any ice pica.  She had heavy menses for the past 2 months.   Past Medical History:  Diagnosis Date  . Allergy    seasonal  . Anemia   . Arthritis    right hip  . Headache    sinus/allergies  . Hypertension   . Seasonal allergies   . Vertigo    1-2x/month  . Wears contact lenses     Past Surgical History:  Procedure Laterality Date  . COLONOSCOPY N/A 05/03/2015   Procedure: COLONOSCOPY;  Surgeon: Lucilla Lame, MD;  Location: [place];  Service: Gastroenterology;  Laterality: N/A;  . ESOPHAGOGASTRODUODENOSCOPY N/A 05/03/2015   Procedure: ESOPHAGOGASTRODUODENOSCOPY (EGD);  Surgeon: Lucilla Lame, MD;  Location: [place];  Service: Gastroenterology;  Laterality: N/A;  . FOOT SURGERY  2014  . Gastric bypass surgery    . POLYPECTOMY  05/03/2015   Procedure: POLYPECTOMY INTESTINAL;  Surgeon: Lucilla Lame, MD;  Location: [place];  Service: Gastroenterology;;    Family History  Problem Relation Age of Onset  . Diabetes Mother   . Hypertension Mother   . Cancer  Father   . Diabetes Father   . Hypertension Father     Social History:  reports that she has never smoked. She has never used smokeless tobacco. She reports that she does not drink alcohol or use drugs.  The patient is alone today.  Allergies:  Allergies  Allergen Reactions  . Penicillin G Itching    Current Medications: Current Outpatient Prescriptions  Medication Sig Dispense Refill  . Cyanocobalamin (VITAMIN B-12 PO) Take by mouth.    . hydrochlorothiazide (MICROZIDE) 12.5 MG capsule Take by mouth. PM    . ibuprofen (ADVIL,MOTRIN) 800 MG tablet     . Multiple Vitamin (MULTIVITAMIN) capsule Take 1 capsule by mouth daily.    . pantoprazole (PROTONIX) 40 MG tablet     . Phendimetrazine Tartrate 105 MG CP24 Take 1 capsule by mouth daily.     No current facility-administered medications for this visit.     Review of Systems:  GENERAL:  Feels "pretty good".  No fevers or sweats.  Weight up 7 pounds. PERFORMANCE STATUS (ECOG):  1 HEENT:  No visual changes, runny nose, sore throat, mouth sores or tenderness. Lungs: No shortness of breath or cough.  No hemoptysis. Cardiac:  No chest pain, palpitations, orthopnea, or PND. GI:  No nausea, vomiting, diarrhea, constipation, melena or hematochezia.  No ice pica. GU:  No urgency, frequency, dysuria, or hematuria.  Menses heavy x 2. Musculoskeletal:  No back pain.  No  joint pain.  No muscle tenderness. Extremities:  No pain or swelling. Skin:  No rashes or skin changes. Neuro:  No headache, numbness or weakness, balance or coordination issues. Endocrine:  Hot flashes.  No diabetes, thyroid issues, or night sweats. Psych:  No mood changes, depression or anxiety. Pain:  No focal pain. Review of systems:  All other systems reviewed and found to be negative.  Physical Exam: Blood pressure 136/83, pulse 86, temperature 97.5 F (36.4 C), resp. rate 18, weight (!) 303 lb 3 oz (137.5 kg). GENERAL:  Well developed, well nourished, heavyset  woman sitting comfortably in the exam room in no acute distress. MENTAL STATUS:  Alert and oriented to person, place and time. HEAD:  Shoulder length straight long dark brown hair.  Normocephalic, atraumatic, face symmetric, no Cushingoid features. EYES:  Brown eyes.  Pupils equal round and reactive to light and accomodation.  No conjunctivitis or scleral icterus. ENT:  Oropharynx clear without lesion.  Tongue normal. Mucous membranes moist.  RESPIRATORY:  Clear to auscultation without rales, wheezes or rhonchi. CARDIOVASCULAR:  Regular rate and rhythm without murmur, rub or gallop. ABDOMEN:  Soft, non-tender, with active bowel sounds, and no appreciable hepatosplenomegaly.  No masses. SKIN:  No rashes, ulcers or lesions. EXTREMITIES: No edema, no skin discoloration or tenderness.  No palpable cords. LYMPH NODES: No palpable cervical, supraclavicular, axillary or inguinal adenopathy  NEUROLOGICAL: Unremarkable. PSYCH:  Appropriate.   No visits with results within 3 Day(s) from this visit.  Latest known visit with results is:  [facility] Conversion on 11/15/2012  Component Date Value Ref Range Status  . WBC 11/15/2012 13.6* 3.6 - 11.0 x10 3/mm 3 Final  . RBC 11/15/2012 4.70  3.80 - 5.20 X10 6/mm 3 Final  . HGB 11/15/2012 10.9* 12.0 - 16.0 g/dL Final  . HCT 11/15/2012 34.3* 35.0 - 47.0 % Final  . MCV 11/15/2012 73* 80 - 100 fL Final  . MCH 11/15/2012 23.1* 26.0 - 34.0 pg Final  . MCHC 11/15/2012 31.7* 32.0 - 36.0 g/dL Final  . RDW 11/15/2012 16.5* 11.5 - 14.5 % Final  . Platelet 11/15/2012 327  150 - 440 x10 3/mm 3 Final  . Glucose 11/15/2012 95  65 - 99 mg/dL Final  . BUN 11/15/2012 8  7 - 18 mg/dL Final  . Creatinine 11/15/2012 0.75  0.60 - 1.30 mg/dL Final  . Sodium 11/15/2012 140  136 - 145 mmol/L Final  . Potassium 11/15/2012 3.2* 3.5 - 5.1 mmol/L Final  . Chloride 11/15/2012 106  98 - 107 mmol/L Final  . Co2 11/15/2012 29  21 - 32 mmol/L Final  . Calcium, Total 11/15/2012 9.0  8.5 -  10.1 mg/dL Final  . SGOT(AST) 11/15/2012 24  15 - 37 Unit/L Final  . SGPT (ALT) 11/15/2012 29  12 - 78 U/L Final  . Alkaline Phosphatase 11/15/2012 112  50 - 136 Unit/L Final  . Albumin 11/15/2012 3.2* 3.4 - 5.0 g/dL Final  . Total Protein 11/15/2012 7.7  6.4 - 8.2 g/dL Final  . Bilirubin,Total 11/15/2012 0.4  0.2 - 1.0 mg/dL Final  . Osmolality 11/15/2012 278  275 - 301 Final  . Anion Gap 11/15/2012 5* 7 - 16 Final  . EGFR (African American) 11/15/2012 >60   Final  . EGFR (Non-African Amer.) 11/15/2012 >60   Final   Comment: eGFR values <86m/min/1.73 m2 may be an indication of chronic kidney disease (CKD). Calculated eGFR is useful in patients with stable renal function. The eGFR calculation will not  be reliable in acutely ill patients when serum creatinine is changing rapidly. It is not useful in  patients on dialysis. The eGFR calculation may not be applicable to patients at the low and high extremes of body sizes, pregnant women, and vegetarians.   . Troponin-I 11/15/2012 < 0.02  ng/mL Final   Comment: 0.00-0.05 0.05 ng/mL or less: NEGATIVE  Repeat testing in 3-6 hrs  if clinically indicated. >0.05 ng/mL: POTENTIAL  MYOCARDIAL INJURY. Repeat  testing in 3-6 hrs if  clinically indicated. NOTE: An increase or decrease  of 30% or more on serial  testing suggests a  clinically important change   . CK, Total 11/15/2012 113  21 - 215 Unit/L Final  . CK-MB 11/15/2012 0.7  0.5 - 3.6 ng/mL Final  . Color - urine 11/15/2012 Amber   Final  . Clarity - urine 11/15/2012 Turbid   Final  . [place] Meth 11/15/2012 Negative  0 - 75 mg/dL Final  . Bilirubin,UR 11/15/2012 Negative  NEGATIVE Final  . Ketone 11/15/2012 Negative  NEGATIVE Final  . Specific Gravity 11/15/2012 1.028  1.003 - 1.030 Final  . Blood 11/15/2012 3+  NEGATIVE Final  . Ph 11/15/2012 6.0  4.5 - 8.0 Final  . Protein 11/15/2012 100 mg/dL  NEGATIVE Final  . Nitrite 11/15/2012 Negative  NEGATIVE Final  . Leukocyte  Esterase 11/15/2012 Trace  NEGATIVE Final  . RBC,UR 11/15/2012 2793 /HPF  0 - 5 /HPF Final  . WBC UR 11/15/2012 7 /HPF  0 - 5 /HPF Final  . Bacteria 11/15/2012 NONE SEEN  NONE SEEN Final  . Squamous Epithelial 11/15/2012 2 /HPF   Final  . Mucous 11/15/2012 PRESENT   Final   [facility] labs: CBC  On 08/30/2016 revealed a hematocrit of 37.4, hemoglobin 12.8, MCV 78, platelets 293,000, WBC 10,500 with an [place] of 6300.  Absolute lymphocyte count was 3200 ([phone]).  Ferritin and iron studies are pending.   Assessment:  Gage Treiber is a 51 y.o. female status post gastric bypass surgery approximately 10 years ago with a progressive microcytic anemia since 11/2013. Iron studies are consistent with iron deficiency anemia (ferritin 6, iron satration 4%). She takes oral B12. Labs on 04/04/2015 revealed a normal B12, folate, and TSH. She denies any melena, hematochezia, or hematuria. Diet is good.  She was on oral iron for 2 months without significant improvement.  EGD and colonoscopy on 05/04/2015 revealed ulceration at the jejunojejunal anastomosis. There were a few non-bleeding cratered ulcers with no stigmata of bleeding distal to the gastrojejunal anastomosis. Biopsies revealed inflammation with focal erosion. Colonoscopy revealed one 6 mm polyp in the descending colon (tubular adenoma) and non-bleeding internal hemorrhoids.  She notes menses heavy with more cramps.  She received Venofer from 06/16/2015 - 06/30/2015, 02/22/2016, 06/18/2016 - 06/25/2016, and 12/11/2016.  Ferritin goal is 100 with consideration of treatment if < 50.  Labs on 07/06/2015 revealed a hematocrit of 36.3, hemoglobin 11.7, and MCV 76. Ferritin was 150.  Labs on 02/13/2016 revealed a hematocrit of 37.4, hemoglobin 12.3, MCV 81, platelets 299,000, WBC 10,800 with an ANC of 6500.  Absolute lymphocyte count was 3300 ([phone]).  B12 was 1378.  Folate was > 20.  Ferritin was 21.  Symptomatically, she feels good. She has  had heavy menses in the past months.  Exam is stable.  Hematocrit is 39.3.  Ferritin is 59.  Plan: 1.  Review interval [facility] labs. 2.  No Venofer today. 3.  [facility] slips for 3 and 6 months. 4.  RTC in 6 months for MD assess, review of [facility] labs, and +/- Venofer.    Lequita Asal, MD  02/27/2017, 2:21 PM

## 2017-03-04 ENCOUNTER — Ambulatory Visit
Admission: RE | Admit: 2017-03-04 | Discharge: 2017-03-04 | Disposition: A | Discharge status: Home / Self Care | Payer: 59 | Source: Ambulatory Visit | Attending: Internal Medicine | Admitting: Internal Medicine

## 2017-03-04 DIAGNOSIS — Z1231 Encounter for screening mammogram for malignant neoplasm of breast: Secondary | ICD-10-CM | POA: Diagnosis present

## 2017-03-11 ENCOUNTER — Ambulatory Visit
Admission: RE | Admit: 2017-03-11 | Discharge: 2017-03-11 | Disposition: A | Discharge status: Home / Self Care | Payer: 59 | Source: Ambulatory Visit | Attending: Nurse Practitioner | Admitting: Nurse Practitioner

## 2017-03-11 ENCOUNTER — Other Ambulatory Visit: Payer: Self-pay | Admitting: Nurse Practitioner

## 2017-03-11 DIAGNOSIS — R52 Pain, unspecified: Secondary | ICD-10-CM

## 2017-03-11 DIAGNOSIS — M25551 Pain in right hip: Secondary | ICD-10-CM | POA: Insufficient documentation

## 2017-03-11 DIAGNOSIS — M47816 Spondylosis without myelopathy or radiculopathy, lumbar region: Secondary | ICD-10-CM | POA: Insufficient documentation

## 2017-03-11 DIAGNOSIS — M85851 Other specified disorders of bone density and structure, right thigh: Secondary | ICD-10-CM | POA: Diagnosis not present

## 2017-08-13 ENCOUNTER — Telehealth: Payer: Self-pay | Admitting: *Deleted

## 2017-08-13 NOTE — Telephone Encounter (Signed)
  Do we have any lab results?  M

## 2017-08-13 NOTE — Telephone Encounter (Signed)
Called patient and left a message to call back if symptomatic from a ferritin of 35 and that we could schedule 1 dose of IV venofer.  Awaiting call back.

## 2017-08-13 NOTE — Telephone Encounter (Signed)
done

## 2017-08-13 NOTE — Telephone Encounter (Signed)
Patient reports she had labs drawn last week at [place] and is asking if we have results yet. Please call her back with results. [phone]

## 2017-08-13 NOTE — Telephone Encounter (Signed)
We are going to call [facility].

## 2017-08-14 ENCOUNTER — Telehealth: Payer: Self-pay | Admitting: *Deleted

## 2017-08-14 ENCOUNTER — Other Ambulatory Visit: Payer: Self-pay | Admitting: Hematology and Oncology

## 2017-08-14 NOTE — Telephone Encounter (Signed)
Patient returned call and reports feeling tired and run down and does want to have a dose of IV venofer, appt sent to schedulers.  Dr. Mike Gip informed of patients decision.

## 2017-08-21 ENCOUNTER — Inpatient Hospital Stay: Payer: 59 | Attending: Hematology and Oncology

## 2017-08-21 VITALS — BP 139/83 | HR 75 | Temp 97.7°F | Resp 18

## 2017-08-21 DIAGNOSIS — D509 Iron deficiency anemia, unspecified: Secondary | ICD-10-CM

## 2017-08-21 DIAGNOSIS — Z79899 Other long term (current) drug therapy: Secondary | ICD-10-CM | POA: Diagnosis not present

## 2017-08-21 MED ORDER — IRON SUCROSE 20 MG/ML IV SOLN
200.0000 mg | Freq: Once | INTRAVENOUS | Status: AC
  Administered 2017-08-21: 200 mg via INTRAVENOUS
  Filled 2017-08-21: qty 10

## 2017-08-21 MED ORDER — SODIUM CHLORIDE 0.9 % IV SOLN
Freq: Once | INTRAVENOUS | Status: AC
  Administered 2017-08-21: 14:00:00 via INTRAVENOUS
  Filled 2017-08-21: qty 1000

## 2017-08-25 ENCOUNTER — Encounter: Payer: Self-pay | Admitting: Hematology and Oncology

## 2017-09-11 ENCOUNTER — Inpatient Hospital Stay: Payer: 59 | Admitting: Hematology and Oncology

## 2017-09-11 ENCOUNTER — Inpatient Hospital Stay: Payer: 59

## 2017-10-01 ENCOUNTER — Encounter: Payer: Self-pay | Admitting: Hematology and Oncology

## 2017-10-02 ENCOUNTER — Inpatient Hospital Stay: Payer: 59 | Admitting: Hematology and Oncology

## 2017-10-02 ENCOUNTER — Encounter: Payer: Self-pay | Admitting: Hematology and Oncology

## 2017-10-02 ENCOUNTER — Other Ambulatory Visit: Payer: Self-pay | Admitting: Hematology and Oncology

## 2017-10-02 ENCOUNTER — Inpatient Hospital Stay: Payer: 59

## 2017-10-02 NOTE — Progress Notes (Deleted)
[facility] [facility] day:  10/02/17  Chief Complaint: Stephanie Gates is a 51 y.o. female with history of gastric bypass surgery and severe iron deficiency anemia who is seen for 7 month assessment.  HPI: The patient was last seen in the medical oncology clinic on 02/27/2017.  At that time, she felt "pretty good".  She described heavy menses.  Exam was stable.  Hematocrit was 39.3.  Ferritin was 59.  Labs from [place] on 07/31/2017 revealed a hematocrit of 39.4, hemoglobin 12.5, and MCV 81.  Ferritin was 35.  She called on 08/14/2017 complaining of fatigue.  She received Venofer 200 mg on 08/21/2017.  Labs from [place] on 02/25/2017 revealed a hematocrit of 39.3, hemoglobin 12.7, MCV 82, platelets 345,000, WBC 11,500 with an ANC of 7300.  Ferritin was 59.   Symptomatically,    Past Medical History:  Diagnosis Date  . Allergy    seasonal  . Anemia   . Arthritis    right hip  . Headache    sinus/allergies  . Hypertension   . Seasonal allergies   . Vertigo    1-2x/month  . Wears contact lenses     Past Surgical History:  Procedure Laterality Date  . COLONOSCOPY N/A 05/03/2015   Procedure: COLONOSCOPY;  Surgeon: Lucilla Lame, MD;  Location: [place];  Service: Gastroenterology;  Laterality: N/A;  . ESOPHAGOGASTRODUODENOSCOPY N/A 05/03/2015   Procedure: ESOPHAGOGASTRODUODENOSCOPY (EGD);  Surgeon: Lucilla Lame, MD;  Location: [place];  Service: Gastroenterology;  Laterality: N/A;  . FOOT SURGERY  2014  . Gastric bypass surgery    . POLYPECTOMY  05/03/2015   Procedure: POLYPECTOMY INTESTINAL;  Surgeon: Lucilla Lame, MD;  Location: [place];  Service: Gastroenterology;;    Family History  Problem Relation Age of Onset  . Diabetes Mother   . Hypertension Mother   . Cancer Father   . Diabetes Father   . Hypertension Father     Social History:  reports that she has never smoked. She has never used smokeless  tobacco. She reports that she does not drink alcohol or use drugs.  The patient is alone today.  Allergies:  Allergies  Allergen Reactions  . Penicillin G Itching    Current Medications: Current Outpatient Prescriptions  Medication Sig Dispense Refill  . Cyanocobalamin (VITAMIN B-12 PO) Take by mouth.    . hydrochlorothiazide (MICROZIDE) 12.5 MG capsule Take by mouth. PM    . ibuprofen (ADVIL,MOTRIN) 800 MG tablet     . Multiple Vitamin (MULTIVITAMIN) capsule Take 1 capsule by mouth daily.    . pantoprazole (PROTONIX) 40 MG tablet     . Phendimetrazine Tartrate 105 MG CP24 Take 1 capsule by mouth daily.     No current facility-administered medications for this visit.     Review of Systems:  GENERAL:  Feels "pretty good".  No fevers or sweats.  Weight up 7 pounds. PERFORMANCE STATUS (ECOG):  1 HEENT:  No visual changes, runny nose, sore throat, mouth sores or tenderness. Lungs: No shortness of breath or cough.  No hemoptysis. Cardiac:  No chest pain, palpitations, orthopnea, or PND. GI:  No nausea, vomiting, diarrhea, constipation, melena or hematochezia.  No ice pica. GU:  No urgency, frequency, dysuria, or hematuria.  Menses heavy x 2. Musculoskeletal:  No back pain.  No joint pain.  No muscle tenderness. Extremities:  No pain or swelling. Skin:  No rashes or skin changes. Neuro:  No headache, numbness or weakness,  balance or coordination issues. Endocrine:  Hot flashes.  No diabetes, thyroid issues, or night sweats. Psych:  No mood changes, depression or anxiety. Pain:  No focal pain. Review of systems:  All other systems reviewed and found to be negative.  Physical Exam: There were no vitals taken for this visit. GENERAL:  Well developed, well nourished, heavyset woman sitting comfortably in the exam room in no acute distress. MENTAL STATUS:  Alert and oriented to person, place and time. HEAD:  Shoulder length straight long dark brown hair.  Normocephalic, atraumatic,  face symmetric, no Cushingoid features. EYES:  Brown eyes.  Pupils equal round and reactive to light and accomodation.  No conjunctivitis or scleral icterus. ENT:  Oropharynx clear without lesion.  Tongue normal. Mucous membranes moist.  RESPIRATORY:  Clear to auscultation without rales, wheezes or rhonchi. CARDIOVASCULAR:  Regular rate and rhythm without murmur, rub or gallop. ABDOMEN:  Soft, non-tender, with active bowel sounds, and no appreciable hepatosplenomegaly.  No masses. SKIN:  No rashes, ulcers or lesions. EXTREMITIES: No edema, no skin discoloration or tenderness.  No palpable cords. LYMPH NODES: No palpable cervical, supraclavicular, axillary or inguinal adenopathy  NEUROLOGICAL: Unremarkable. PSYCH:  Appropriate.  [facility] labs: 08/30/2016:  hematocrit of 37.4, hemoglobin 12.8, MCV 78, platelets 293,000, WBC 10,500 with an ANC of 6300.  Absolute lymphocyte count was 3200 [phone]).  02/25/2017:  hematocrit of 39.3, hemoglobin 12.7, MCV 82, platelets 345,000, WBC 11,500 with an ANC of 7300.  Ferritin was 59. 07/31/2017:  hematocrit of 39.4, hemoglobin 12.5, and MCV 81.  Ferritin was 35.   No visits with results within 3 Day(s) from this visit.  Latest known visit with results is:  [facility] Conversion on 11/15/2012  Component Date Value Ref Range Status  . WBC 11/15/2012 13.6* 3.6 - 11.0 x10 3/mm 3 Final  . RBC 11/15/2012 4.70  3.80 - 5.20 X10 6/mm 3 Final  . HGB 11/15/2012 10.9* 12.0 - 16.0 g/dL Final  . HCT 11/15/2012 34.3* 35.0 - 47.0 % Final  . MCV 11/15/2012 73* 80 - 100 fL Final  . MCH 11/15/2012 23.1* 26.0 - 34.0 pg Final  . MCHC 11/15/2012 31.7* 32.0 - 36.0 g/dL Final  . RDW 11/15/2012 16.5* 11.5 - 14.5 % Final  . Platelet 11/15/2012 327  150 - 440 x10 3/mm 3 Final  . Glucose 11/15/2012 95  65 - 99 mg/dL Final  . BUN 11/15/2012 8  7 - 18 mg/dL Final  . Creatinine 11/15/2012 0.75  0.60 - 1.30 mg/dL Final  . Sodium 11/15/2012 140  136 - 145 mmol/L Final  . Potassium  11/15/2012 3.2* 3.5 - 5.1 mmol/L Final  . Chloride 11/15/2012 106  98 - 107 mmol/L Final  . Co2 11/15/2012 29  21 - 32 mmol/L Final  . Calcium, Total 11/15/2012 9.0  8.5 - 10.1 mg/dL Final  . SGOT(AST) 11/15/2012 24  15 - 37 Unit/L Final  . SGPT (ALT) 11/15/2012 29  12 - 78 U/L Final  . Alkaline Phosphatase 11/15/2012 112  50 - 136 Unit/L Final  . Albumin 11/15/2012 3.2* 3.4 - 5.0 g/dL Final  . Total Protein 11/15/2012 7.7  6.4 - 8.2 g/dL Final  . Bilirubin,Total 11/15/2012 0.4  0.2 - 1.0 mg/dL Final  . Osmolality 11/15/2012 278  275 - 301 Final  . Anion Gap 11/15/2012 5* 7 - 16 Final  . EGFR (African American) 11/15/2012 >60   Final  . EGFR (Non-African Amer.) 11/15/2012 >60   Final   Comment: eGFR values <  44m/min/1.73 m2 may be an indication of chronic kidney disease (CKD). Calculated eGFR is useful in patients with stable renal function. The eGFR calculation will not be reliable in acutely ill patients when serum creatinine is changing rapidly. It is not useful in  patients on dialysis. The eGFR calculation may not be applicable to patients at the low and high extremes of body sizes, pregnant women, and vegetarians.   . Troponin-I 11/15/2012 < 0.02  ng/mL Final   Comment: 0.00-0.05 0.05 ng/mL or less: NEGATIVE  Repeat testing in 3-6 hrs  if clinically indicated. >0.05 ng/mL: POTENTIAL  MYOCARDIAL INJURY. Repeat  testing in 3-6 hrs if  clinically indicated. NOTE: An increase or decrease  of 30% or more on serial  testing suggests a  clinically important change   . CK, Total 11/15/2012 113  21 - 215 Unit/L Final  . CK-MB 11/15/2012 0.7  0.5 - 3.6 ng/mL Final  . Color - urine 11/15/2012 Amber   Final  . Clarity - urine 11/15/2012 Turbid   Final  . [place] Meth12/07/2012 Negative  0 - 75 mg/dL Final  . Bilirubin,UR 11/15/2012 Negative  NEGATIVE Final  . Ketone 11/15/2012 Negative  NEGATIVE Final  . Specific Gravity 11/15/2012 1.028  1.003 - 1.030 Final  . Blood 11/15/2012  3+  NEGATIVE Final  . Ph 11/15/2012 6.0  4.5 - 8.0 Final  . Protein 11/15/2012 100 mg/dL  NEGATIVE Final  . Nitrite 11/15/2012 Negative  NEGATIVE Final  . Leukocyte Esterase 11/15/2012 Trace  NEGATIVE Final  . RBC,UR 11/15/2012 2793 /HPF  0 - 5 /HPF Final  . WBC UR 11/15/2012 7 /HPF  0 - 5 /HPF Final  . Bacteria 11/15/2012 NONE SEEN  NONE SEEN Final  . Squamous Epithelial 11/15/2012 2 /HPF   Final  . Mucous 11/15/2012 PRESENT   Final    Assessment:  TRonan Dionis a 51y.o. female status post gastric bypass surgery approximately 10 years ago with a progressive microcytic anemia since 11/2013. Iron studies are consistent with iron deficiency anemia (ferritin 6, iron satration 4%). She takes oral B12. Labs on 04/04/2015 revealed a normal B12, folate, and TSH. She denies any melena, hematochezia, or hematuria. Diet is good.  She was on oral iron for 2 months without significant improvement.  EGD and colonoscopy on 05/04/2015 revealed ulceration at the jejunojejunal anastomosis. There were a few non-bleeding cratered ulcers with no stigmata of bleeding distal to the gastrojejunal anastomosis. Biopsies revealed inflammation with focal erosion. Colonoscopy revealed one 6 mm polyp in the descending colon (tubular adenoma) and non-bleeding internal hemorrhoids.  She notes menses heavy with more cramps.  She received Venofer from 06/16/2015 - 06/30/2015, 02/22/2016, 06/18/2016 - 06/25/2016, 12/11/2016, and 08/21/2017.  Ferritin goal is 100 with consideration of treatment if < 50.  Labs on 07/06/2015 revealed a hematocrit of 36.3, hemoglobin 11.7, and MCV 76. Ferritin was 150.  Labs on 02/13/2016 revealed a hematocrit of 37.4, hemoglobin 12.3, MCV 81, platelets 299,000, WBC 10,800 with an ANC of 6500.  Absolute lymphocyte count was 3300 ([phone]).  B12 was 1378.  Folate was > 20.  Ferritin was 21.  Symptomatically, she feels good. She has had heavy menses in the past months.  Exam is  stable.  Hematocrit is 39.3.  Ferritin is 59.  Plan: 1.  Review interval [facility] labs.  2.  No Venofer today. 3.  [facility] slips for 3 and 6 months. 4.  RTC in 6 months for MD assess, review of [facility] labs,  and +/- Venofer.    Lequita Asal, MD  10/02/2017, 5:52 AM

## 2017-10-10 ENCOUNTER — Encounter: Payer: Self-pay | Admitting: Hematology and Oncology

## 2017-10-10 ENCOUNTER — Inpatient Hospital Stay: Payer: 59

## 2017-10-10 ENCOUNTER — Telehealth: Payer: Self-pay | Admitting: *Deleted

## 2017-10-10 ENCOUNTER — Inpatient Hospital Stay: Payer: 59 | Admitting: Hematology and Oncology

## 2017-10-10 NOTE — Progress Notes (Deleted)
[facility] day:  10/10/17  Chief Complaint: Stephanie Gates is a 51 y.o. female with history of gastric bypass surgery and severe iron deficiency anemia who is seen for 7 month assessment.  HPI: The patient was last seen in the medical oncology clinic on 02/27/2017.  At that time, she felt "pretty good".  She described heavy menses.  Exam was stable.  Hematocrit was 39.3.  Ferritin was 59.  Labs from [place] on 07/31/2017 revealed a hematocrit of 39.4, hemoglobin 12.5, and MCV 81.  Ferritin was 35.    She called on 08/14/2017 complaining of fatigue.  She received Venofer 200 mg on 08/21/2017.  Symptomatically,    Past Medical History:  Diagnosis Date  . Allergy    seasonal  . Anemia   . Arthritis    right hip  . Headache    sinus/allergies  . Hypertension   . Seasonal allergies   . Vertigo    1-2x/month  . Wears contact lenses     Past Surgical History:  Procedure Laterality Date  . COLONOSCOPY N/A 05/03/2015   Procedure: COLONOSCOPY;  Surgeon: Lucilla Lame, MD;  Location: [facility];  Service: Gastroenterology;  Laterality: N/A;  . ESOPHAGOGASTRODUODENOSCOPY N/A 05/03/2015   Procedure: ESOPHAGOGASTRODUODENOSCOPY (EGD);  Surgeon: Lucilla Lame, MD;  Location: [facility];  Service: Gastroenterology;  Laterality: N/A;  . FOOT SURGERY  2014  . Gastric bypass surgery    . POLYPECTOMY  05/03/2015   Procedure: POLYPECTOMY INTESTINAL;  Surgeon: Lucilla Lame, MD;  Location: [facility];  Service: Gastroenterology;;    Family History  Problem Relation Age of Onset  . Diabetes Mother   . Hypertension Mother   . Cancer Father   . Diabetes Father   . Hypertension Father     Social History:  reports that she has never smoked. She has never used smokeless tobacco. She reports that she does not drink alcohol or use drugs.  The patient is alone today.  Allergies:  Allergies  Allergen Reactions  . Penicillin  G Itching    Current Medications: Current Outpatient Prescriptions  Medication Sig Dispense Refill  . Cyanocobalamin (VITAMIN B-12 PO) Take by mouth.    . hydrochlorothiazide (MICROZIDE) 12.5 MG capsule Take by mouth. PM    . ibuprofen (ADVIL,MOTRIN) 800 MG tablet     . Multiple Vitamin (MULTIVITAMIN) capsule Take 1 capsule by mouth daily.    . pantoprazole (PROTONIX) 40 MG tablet     . Phendimetrazine Tartrate 105 MG CP24 Take 1 capsule by mouth daily.     No current facility-administered medications for this visit.     Review of Systems:  GENERAL:  Feels "pretty good".  No fevers or sweats.  Weight up 7 pounds. PERFORMANCE STATUS (ECOG):  1 HEENT:  No visual changes, runny nose, sore throat, mouth sores or tenderness. Lungs: No shortness of breath or cough.  No hemoptysis. Cardiac:  No chest pain, palpitations, orthopnea, or PND. GI:  No nausea, vomiting, diarrhea, constipation, melena or hematochezia.  No ice pica. GU:  No urgency, frequency, dysuria, or hematuria.  Menses heavy x 2. Musculoskeletal:  No back pain.  No joint pain.  No muscle tenderness. Extremities:  No pain or swelling. Skin:  No rashes or skin changes. Neuro:  No headache, numbness or weakness, balance or coordination issues. Endocrine:  Hot flashes.  No diabetes, thyroid issues, or night sweats. Psych:  No mood changes, depression or anxiety. Pain:  No  focal pain. Review of systems:  All other systems reviewed and found to be negative.  Physical Exam: There were no vitals taken for this visit. GENERAL:  Well developed, well nourished, heavyset woman sitting comfortably in the exam room in no acute distress. MENTAL STATUS:  Alert and oriented to person, place and time. HEAD:  Shoulder length straight long dark brown hair.  Normocephalic, atraumatic, face symmetric, no Cushingoid features. EYES:  Brown eyes.  Pupils equal round and reactive to light and accomodation.  No conjunctivitis or scleral  icterus. ENT:  Oropharynx clear without lesion.  Tongue normal. Mucous membranes moist.  RESPIRATORY:  Clear to auscultation without rales, wheezes or rhonchi. CARDIOVASCULAR:  Regular rate and rhythm without murmur, rub or gallop. ABDOMEN:  Soft, non-tender, with active bowel sounds, and no appreciable hepatosplenomegaly.  No masses. SKIN:  No rashes, ulcers or lesions. EXTREMITIES: No edema, no skin discoloration or tenderness.  No palpable cords. LYMPH NODES: No palpable cervical, supraclavicular, axillary or inguinal adenopathy  NEUROLOGICAL: Unremarkable. PSYCH:  Appropriate.  [facility] labs: 08/30/2016:  hematocrit of 37.4, hemoglobin 12.8, MCV 78, platelets 293,000, WBC 10,500 with an ANC of 6300.  Absolute lymphocyte count was 3200 ([phone]).  02/25/2017:  hematocrit of 39.3, hemoglobin 12.7, MCV 82, platelets 345,000, WBC 11,500 with an ANC of 7300.  Ferritin was 59. 07/31/2017:  hematocrit of 39.4, hemoglobin 12.5, and MCV 81.  Ferritin was 35.   No visits with results within 3 Day(s) from this visit.  Latest known visit with results is:  [facility] Conversion on 11/15/2012  Component Date Value Ref Range Status  . WBC 11/15/2012 13.6* 3.6 - 11.0 x10 3/mm 3 Final  . RBC 11/15/2012 4.70  3.80 - 5.20 X10 6/mm 3 Final  . HGB 11/15/2012 10.9* 12.0 - 16.0 g/dL Final  . HCT 11/15/2012 34.3* 35.0 - 47.0 % Final  . MCV 11/15/2012 73* 80 - 100 fL Final  . MCH 11/15/2012 23.1* 26.0 - 34.0 pg Final  . MCHC 11/15/2012 31.7* 32.0 - 36.0 g/dL Final  . RDW 11/15/2012 16.5* 11.5 - 14.5 % Final  . Platelet 11/15/2012 327  150 - 440 x10 3/mm 3 Final  . Glucose 11/15/2012 95  65 - 99 mg/dL Final  . BUN 11/15/2012 8  7 - 18 mg/dL Final  . Creatinine 11/15/2012 0.75  0.60 - 1.30 mg/dL Final  . Sodium 11/15/2012 140  136 - 145 mmol/L Final  . Potassium 11/15/2012 3.2* 3.5 - 5.1 mmol/L Final  . Chloride 11/15/2012 106  98 - 107 mmol/L Final  . Co2 11/15/2012 29  21 - 32 mmol/L Final  . Calcium,  Total 11/15/2012 9.0  8.5 - 10.1 mg/dL Final  . SGOT(AST) 11/15/2012 24  15 - 37 Unit/L Final  . SGPT (ALT) 11/15/2012 29  12 - 78 U/L Final  . Alkaline Phosphatase 11/15/2012 112  50 - 136 Unit/L Final  . Albumin 11/15/2012 3.2* 3.4 - 5.0 g/dL Final  . Total Protein 11/15/2012 7.7  6.4 - 8.2 g/dL Final  . Bilirubin,Total 11/15/2012 0.4  0.2 - 1.0 mg/dL Final  . Osmolality 11/15/2012 278  275 - 301 Final  . Anion Gap 11/15/2012 5* 7 - 16 Final  . EGFR (African American) 11/15/2012 >60   Final  . EGFR (Non-African Amer.) 11/15/2012 >60   Final   Comment: eGFR values <24m/min/1.73 m2 may be an indication of chronic kidney disease (CKD). Calculated eGFR is useful in patients with stable renal function. The eGFR calculation will not be  reliable in acutely ill patients when serum creatinine is changing rapidly. It is not useful in  patients on dialysis. The eGFR calculation may not be applicable to patients at the low and high extremes of body sizes, pregnant women, and vegetarians.   . Troponin-I 11/15/2012 < 0.02  ng/mL Final   Comment: 0.00-0.05 0.05 ng/mL or less: NEGATIVE  Repeat testing in 3-6 hrs  if clinically indicated. >0.05 ng/mL: POTENTIAL  MYOCARDIAL INJURY. Repeat  testing in 3-6 hrs if  clinically indicated. NOTE: An increase or decrease  of 30% or more on serial  testing suggests a  clinically important change   . CK, Total 11/15/2012 113  21 - 215 Unit/L Final  . CK-MB 11/15/2012 0.7  0.5 - 3.6 ng/mL Final  . Color - urine 11/15/2012 Amber   Final  . Clarity - urine 11/15/2012 Turbid   Final  . [place] Meth 11/15/2012 Negative  0 - 75 mg/dL Final  . Bilirubin,UR 11/15/2012 Negative  NEGATIVE Final  . Ketone 11/15/2012 Negative  NEGATIVE Final  . Specific Gravity 11/15/2012 1.028  1.003 - 1.030 Final  . Blood 11/15/2012 3+  NEGATIVE Final  . Ph 11/15/2012 6.0  4.5 - 8.0 Final  . Protein 11/15/2012 100 mg/dL  NEGATIVE Final  . Nitrite 11/15/2012 Negative   NEGATIVE Final  . Leukocyte Esterase 11/15/2012 Trace  NEGATIVE Final  . RBC,UR 11/15/2012 2793 /HPF  0 - 5 /HPF Final  . WBC UR 11/15/2012 7 /HPF  0 - 5 /HPF Final  . Bacteria 11/15/2012 NONE SEEN  NONE SEEN Final  . Squamous Epithelial 11/15/2012 2 /HPF   Final  . Mucous 11/15/2012 PRESENT   Final    Assessment:  Litzy Dicker is a 51 y.o. female status post gastric bypass surgery approximately 10 years ago with a progressive microcytic anemia since 11/2013. Iron studies are consistent with iron deficiency anemia (ferritin 6, iron satration 4%). She takes oral B12. Labs on 04/04/2015 revealed a normal B12, folate, and TSH. She denies any melena, hematochezia, or hematuria. Diet is good.  She was on oral iron for 2 months without significant improvement.  EGD and colonoscopy on 05/04/2015 revealed ulceration at the jejunojejunal anastomosis. There were a few non-bleeding cratered ulcers with no stigmata of bleeding distal to the gastrojejunal anastomosis. Biopsies revealed inflammation with focal erosion. Colonoscopy revealed one 6 mm polyp in the descending colon (tubular adenoma) and non-bleeding internal hemorrhoids.  She notes menses heavy with more cramps.  She received Venofer from 06/16/2015 - 06/30/2015, 02/22/2016, 06/18/2016 - 06/25/2016, 12/11/2016, and 08/21/2017.  Ferritin goal is 100 with consideration of treatment if < 50.  Ferritin has been followed:  59 on 02/25/2017 and 35 on 07/31/2017.  Labs on 07/06/2015 revealed a hematocrit of 36.3, hemoglobin 11.7, and MCV 76. Ferritin was 150.  Labs on 02/13/2016 revealed a hematocrit of 37.4, hemoglobin 12.3, MCV 81, platelets 299,000, WBC 10,800 with an ANC of 6500.  Absolute lymphocyte count was 3300 [phone]).  B12 was 1378.  Folate was > 20.  Ferritin was 21.  Symptomatically, she feels good. She has had heavy menses in the past months.  Exam is stable.  Hematocrit is 39.3.  Ferritin is 59.  Plan: 1.  Review  interval [facility] labs.  2.  No Venofer today. 3.  [facility] slips for 3 and 6 months. 4.  RTC in 6 months for MD assess, review of [facility] labs, and +/- Venofer.    Lequita Asal, MD  10/10/2017, 1:21 PM  I saw and evaluated the patient, participating in the key portions of the service and reviewing pertinent diagnostic studies and records.  I reviewed the nurse practitioner's note and agree with the findings and the plan.  The assessment and plan were discussed with the patient.  Additional diagnostic studies of *** are needed to clarify *** and would change the clinical management.  A few ***multiple questions were asked by the patient and answered.   Nolon Stalls, MD 10/10/2017,1:21 PM

## 2017-10-10 NOTE — Telephone Encounter (Signed)
Attempted to call patient to tell her MD wants her to have labs drawn @ [facility].  No answer and mailbox is full.  Will try to call on Monday.

## 2017-10-15 ENCOUNTER — Encounter: Payer: Self-pay | Admitting: Hematology and Oncology

## 2017-12-12 ENCOUNTER — Ambulatory Visit: Payer: Self-pay | Admitting: Nurse Practitioner

## 2017-12-12 DIAGNOSIS — M25551 Pain in right hip: Secondary | ICD-10-CM | POA: Insufficient documentation

## 2017-12-12 DIAGNOSIS — R51 Headache: Secondary | ICD-10-CM

## 2017-12-12 DIAGNOSIS — K219 Gastro-esophageal reflux disease without esophagitis: Secondary | ICD-10-CM | POA: Insufficient documentation

## 2017-12-12 DIAGNOSIS — M150 Primary generalized (osteo)arthritis: Secondary | ICD-10-CM | POA: Insufficient documentation

## 2017-12-12 DIAGNOSIS — I10 Essential (primary) hypertension: Secondary | ICD-10-CM | POA: Insufficient documentation

## 2017-12-12 DIAGNOSIS — G5603 Carpal tunnel syndrome, bilateral upper limbs: Secondary | ICD-10-CM | POA: Insufficient documentation

## 2017-12-12 DIAGNOSIS — M7712 Lateral epicondylitis, left elbow: Secondary | ICD-10-CM | POA: Insufficient documentation

## 2017-12-12 DIAGNOSIS — G4710 Hypersomnia, unspecified: Secondary | ICD-10-CM | POA: Insufficient documentation

## 2017-12-12 DIAGNOSIS — E559 Vitamin D deficiency, unspecified: Secondary | ICD-10-CM | POA: Insufficient documentation

## 2017-12-12 DIAGNOSIS — R7301 Impaired fasting glucose: Secondary | ICD-10-CM | POA: Insufficient documentation

## 2017-12-12 DIAGNOSIS — E049 Nontoxic goiter, unspecified: Secondary | ICD-10-CM | POA: Insufficient documentation

## 2017-12-12 DIAGNOSIS — M25562 Pain in left knee: Secondary | ICD-10-CM | POA: Insufficient documentation

## 2017-12-12 DIAGNOSIS — R519 Headache, unspecified: Secondary | ICD-10-CM | POA: Insufficient documentation

## 2017-12-12 DIAGNOSIS — J300 Vasomotor rhinitis: Secondary | ICD-10-CM | POA: Insufficient documentation

## 2017-12-12 DIAGNOSIS — R0683 Snoring: Secondary | ICD-10-CM | POA: Insufficient documentation

## 2018-02-12 ENCOUNTER — Ambulatory Visit: Payer: Self-pay | Admitting: Nurse Practitioner

## 2018-03-12 ENCOUNTER — Other Ambulatory Visit: Payer: Self-pay

## 2018-03-12 ENCOUNTER — Telehealth: Payer: Self-pay

## 2018-03-12 MED ORDER — MECLIZINE HCL 12.5 MG PO TABS: 12.5000 mg | ORAL_TABLET | Freq: Two times a day (BID) | ORAL | 0 refills | Status: DC | PRN

## 2018-03-12 NOTE — Telephone Encounter (Signed)
lmom to call back 

## 2018-03-12 NOTE — Telephone Encounter (Signed)
Pt called this morning that she having vertigo as per heayher we gave her meclizine before I send to phar and lmom that we she not feeling better she need to been seen

## 2018-04-07 ENCOUNTER — Encounter: Payer: Self-pay | Admitting: Internal Medicine

## 2018-04-07 ENCOUNTER — Ambulatory Visit: Payer: Managed Care, Other (non HMO) | Admitting: Internal Medicine

## 2018-04-07 VITALS — BP 140/80 | HR 92 | Resp 16 | Ht 65.0 in | Wt 311.4 lb

## 2018-04-07 DIAGNOSIS — M542 Cervicalgia: Secondary | ICD-10-CM | POA: Diagnosis not present

## 2018-04-07 MED ORDER — CYCLOBENZAPRINE HCL 10 MG PO TABS: ORAL_TABLET | ORAL | 2 refills | Status: DC

## 2018-04-07 MED ORDER — MELOXICAM 15 MG PO TABS: ORAL_TABLET | ORAL | 6 refills | Status: DC

## 2018-04-07 NOTE — Progress Notes (Signed)
[facility] [place], [place] [address]  Internal MEDICINE  Office Visit Note  Patient Name: Stephanie Gates  [id number]  [id number]  Date of Service: 04/10/2018  Chief Complaint  Patient presents with  . Arm Pain    request for FMLA for carpel tunnel   Pt is here for a sick visit.  Arm Pain   The incident occurred more than 1 week ago (and neck pain). Incident location: no Trauma  There was no injury mechanism. The quality of the pain is described as burning. Radiates to: both arms. The pain is at a severity of 6/10. The pain is moderate. The pain has been intermittent since the incident. Pertinent negatives include no chest pain. The treatment provided mild relief.  Pt is located in the neck with spasms Current Medication:  Outpatient Encounter Medications as of 04/07/2018  Medication Sig Note  . Cyanocobalamin (VITAMIN B-12 PO) Take by mouth.   . hydrochlorothiazide (MICROZIDE) 12.5 MG capsule Take by mouth. PM 04/20/2015: Received from: [facility]  . meclizine (ANTIVERT) 12.5 MG tablet Take 1 tablet (12.5 mg total) by mouth 2 (two) times daily as needed for dizziness.   . Multiple Vitamin (MULTIVITAMIN) capsule Take 1 capsule by mouth daily.   . pantoprazole (PROTONIX) 40 MG tablet  08/30/2016: Received from: External Pharmacy  . Phendimetrazine Tartrate 105 MG CP24 Take 1 capsule by mouth daily.   . [DISCONTINUED] ibuprofen (ADVIL,MOTRIN) 800 MG tablet  04/20/2015: Received from: External Pharmacy  . cyclobenzaprine (FLEXERIL) 10 MG tablet TAKE ONE TAB PO QHS FOR NECK PAIN PRN   . meloxicam (MOBIC) 15 MG tablet Take one tab po qam as needed for pain    No facility-administered encounter medications on file as of 04/07/2018.    Medical History: Past Medical History:  Diagnosis Date  . Allergy    seasonal  . Anemia   . Arthritis    right hip  . Headache    sinus/allergies  . Hypertension   . Seasonal allergies   . Vertigo     1-2x/month  . Wears contact lenses    Vital Signs: BP 140/80 (BP Location: Left Arm, Patient Position: Sitting, Cuff Size: Large)   Pulse 92   Resp 16   Ht 5\' 5"  (1.651 m)   Wt (!) 311 lb 6.4 oz (141.3 kg)   SpO2 98%   BMI 51.82 kg/m   Review of Systems  Constitutional: Negative for chills, diaphoresis and fatigue.  HENT: Negative for ear pain, postnasal drip and sinus pressure.   Eyes: Negative for photophobia, discharge, redness, itching and visual disturbance.  Respiratory: Negative for cough, shortness of breath and wheezing.   Cardiovascular: Negative for chest pain, palpitations and leg swelling.  Gastrointestinal: Negative for abdominal pain, constipation, diarrhea, nausea and vomiting.  Genitourinary: Negative for dysuria and flank pain.  Musculoskeletal: Negative for arthralgias, back pain, gait problem and neck pain.  Skin: Negative for color change.  Allergic/Immunologic: Negative for environmental allergies and food allergies.  Neurological: Negative for dizziness and headaches.  Hematological: Does not bruise/bleed easily.  Psychiatric/Behavioral: Negative for agitation, behavioral problems (depression) and hallucinations.   Physical Exam  Constitutional: She is oriented to person, place, and time. She appears well-developed and well-nourished. No distress.  HENT:  Head: Normocephalic and atraumatic.  Mouth/Throat: Oropharynx is clear and moist. No oropharyngeal exudate.  Eyes: Pupils are equal, round, and reactive to light. EOM are normal.  Neck: Normal range of motion. Neck supple. No  JVD present. No tracheal deviation present. No thyromegaly present.  Cardiovascular: Normal rate, regular rhythm and normal heart sounds. Exam reveals no gallop and no friction rub.  No murmur heard. Pulmonary/Chest: Effort normal. No respiratory distress. She has no wheezes. She has no rales. She exhibits no tenderness.  Abdominal: Soft. Bowel sounds are normal.   Musculoskeletal: She exhibits tenderness and deformity.  Decreased ROM  Lymphadenopathy:    She has no cervical adenopathy.  Neurological: She is alert and oriented to person, place, and time. No cranial nerve deficit.  Skin: Skin is warm and dry. She is not diaphoretic.  Psychiatric: She has a normal mood and affect. Her behavior is normal. Judgment and thought content normal.   Assessment/Plan: 1. Neck pain -  Start meloxicam (MOBIC) 15 MG tablet; Take one tab po qam as needed for pain  Dispense: 30 tablet; Refill: 6 - Ambulatory referral to Orthopedic Surgery, Evaluate - cyclobenzaprine (FLEXERIL) 10 MG tablet; TAKE ONE TAB PO QHS FOR NECK PAIN PRN  Dispense: 30 tablet; Refill: 2  2. Morbid (severe) obesity due to excess calories ([place]) - [place]ed  General Counseling: Rudean Curt understanding of the findings of todays visit and agrees with plan of treatment. I have discussed any further diagnostic evaluation that may be needed or ordered today. We also reviewed her medications today. she has been encouraged to call the office with any questions or concerns that should arise related to todays visit.   Orders Placed This Encounter  Procedures  . Ambulatory referral to Orthopedic Surgery    Meds ordered this encounter  Medications  . meloxicam (MOBIC) 15 MG tablet    Sig: Take one tab po qam as needed for pain    Dispense:  30 tablet    Refill:  6  . cyclobenzaprine (FLEXERIL) 10 MG tablet    Sig: TAKE ONE TAB PO QHS FOR NECK PAIN PRN    Dispense:  30 tablet    Refill:  2    Time spent:20 Minutes

## 2018-06-02 ENCOUNTER — Encounter: Payer: Self-pay | Admitting: Hematology and Oncology

## 2018-06-26 ENCOUNTER — Other Ambulatory Visit: Payer: Self-pay | Admitting: Nurse Practitioner

## 2018-06-26 DIAGNOSIS — M542 Cervicalgia: Secondary | ICD-10-CM

## 2018-06-26 MED ORDER — HYDROCHLOROTHIAZIDE 12.5 MG PO CAPS: 12.5000 mg | ORAL_CAPSULE | Freq: Every day | ORAL | 3 refills | Status: DC

## 2018-06-26 MED ORDER — CYCLOBENZAPRINE HCL 10 MG PO TABS: ORAL_TABLET | ORAL | 2 refills | Status: DC

## 2018-10-07 ENCOUNTER — Encounter: Payer: Self-pay | Admitting: Adult Health

## 2018-10-07 ENCOUNTER — Telehealth: Payer: Self-pay | Admitting: Adult Health

## 2018-10-07 ENCOUNTER — Ambulatory Visit: Payer: Managed Care, Other (non HMO) | Admitting: Adult Health

## 2018-10-07 VITALS — BP 130/78 | HR 96 | Resp 16 | Ht 65.0 in | Wt 315.0 lb

## 2018-10-07 DIAGNOSIS — K219 Gastro-esophageal reflux disease without esophagitis: Secondary | ICD-10-CM

## 2018-10-07 DIAGNOSIS — M542 Cervicalgia: Secondary | ICD-10-CM | POA: Diagnosis not present

## 2018-10-07 DIAGNOSIS — Z0001 Encounter for general adult medical examination with abnormal findings: Secondary | ICD-10-CM

## 2018-10-07 DIAGNOSIS — R5383 Other fatigue: Secondary | ICD-10-CM

## 2018-10-07 DIAGNOSIS — D509 Iron deficiency anemia, unspecified: Secondary | ICD-10-CM

## 2018-10-07 DIAGNOSIS — Z124 Encounter for screening for malignant neoplasm of cervix: Secondary | ICD-10-CM | POA: Diagnosis not present

## 2018-10-07 DIAGNOSIS — R300 Dysuria: Secondary | ICD-10-CM

## 2018-10-07 MED ORDER — MELOXICAM 15 MG PO TABS: ORAL_TABLET | ORAL | 6 refills | Status: DC

## 2018-10-07 MED ORDER — PHENTERMINE HCL 37.5 MG PO CAPS: 37.5000 mg | ORAL_CAPSULE | ORAL | 0 refills | Status: DC

## 2018-10-07 NOTE — Patient Instructions (Signed)
Phentermine tablets or capsules What is this medicine? PHENTERMINE (FEN ter meen) decreases your appetite. It is used with a reduced calorie diet and exercise to help you lose weight. This medicine may be used for other purposes; ask your health care provider or pharmacist if you have questions. COMMON BRAND NAME(S): Adipex-P, Atti-Plex P, Atti-Plex P Spansule, Fastin, Lomaira, Pro-Fast, Tara-8 What should I tell my health care provider before I take this medicine? They need to know if you have any of these conditions: -agitation -glaucoma -heart disease -high blood pressure -history of substance abuse -lung disease called Primary Pulmonary Hypertension (PPH) -taken an MAOI like Carbex, Eldepryl, Marplan, Nardil, or Parnate in last 14 days -thyroid disease -an unusual or allergic reaction to phentermine, other medicines, foods, dyes, or preservatives -pregnant or trying to get pregnant -breast-feeding How should I use this medicine? Take this medicine by mouth with a glass of water. Follow the directions on the prescription label. The instructions for use may differ based on the product and dose you are taking. Avoid taking this medicine in the evening. It may interfere with sleep. Take your doses at regular intervals. Do not take your medicine more often than directed. Talk to your pediatrician regarding the use of this medicine in children. While this drug may be prescribed for children 17 years or older for selected conditions, precautions do apply. Overdosage: If you think you have taken too much of this medicine contact a poison control center or emergency room at once. NOTE: This medicine is only for you. Do not share this medicine with others. What if I miss a dose? If you miss a dose, take it as soon as you can. If it is almost time for your next dose, take only that dose. Do not take double or extra doses. What may interact with this medicine? Do not take this medicine with any of  the following medications: -duloxetine -MAOIs like Carbex, Eldepryl, Marplan, Nardil, and Parnate -medicines for colds or breathing difficulties like pseudoephedrine or phenylephrine -procarbazine -sibutramine -SSRIs like citalopram, escitalopram, fluoxetine, fluvoxamine, paroxetine, and sertraline -stimulants like dexmethylphenidate, methylphenidate or modafinil -venlafaxine This medicine may also interact with the following medications: -medicines for diabetes This list may not describe all possible interactions. Give your health care provider a list of all the medicines, herbs, non-prescription drugs, or dietary supplements you use. Also tell them if you smoke, drink alcohol, or use illegal drugs. Some items may interact with your medicine. What should I watch for while using this medicine? Notify your physician immediately if you become short of breath while doing your normal activities. Do not take this medicine within 6 hours of bedtime. It can keep you from getting to sleep. Avoid drinks that contain caffeine and try to stick to a regular bedtime every night. This medicine was intended to be used in addition to a healthy diet and exercise. The best results are achieved this way. This medicine is only indicated for short-term use. Eventually your weight loss may level out. At that point, the drug will only help you maintain your new weight. Do not increase or in any way change your dose without consulting your doctor. You may get drowsy or dizzy. Do not drive, use machinery, or do anything that needs mental alertness until you know how this medicine affects you. Do not stand or sit up quickly, especially if you are an older patient. This reduces the risk of dizzy or fainting spells. Alcohol may increase dizziness and drowsiness. Avoid alcoholic   drinks. What side effects may I notice from receiving this medicine? Side effects that you should report to your doctor or health care professional as  soon as possible: -chest pain, palpitations -depression or severe changes in mood -increased blood pressure -irritability -nervousness or restlessness -severe dizziness -shortness of breath -problems urinating -unusual swelling of the legs -vomiting Side effects that usually do not require medical attention (report to your doctor or health care professional if they continue or are bothersome): -blurred vision or other eye problems -changes in sexual ability or desire -constipation or diarrhea -difficulty sleeping -dry mouth or unpleasant taste -headache -nausea This list may not describe all possible side effects. Call your doctor for medical advice about side effects. You may report side effects to FDA at 1-800-FDA-1088. Where should I keep my medicine? Keep out of the reach of children. This medicine can be abused. Keep your medicine in a safe place to protect it from theft. Do not share this medicine with anyone. Selling or giving away this medicine is dangerous and against the law. This medicine may cause accidental overdose and death if taken by other adults, children, or pets. Mix any unused medicine with a substance like cat litter or coffee grounds. Then throw the medicine away in a sealed container like a sealed bag or a coffee can with a lid. Do not use the medicine after the expiration date. Store at room temperature between 20 and 25 degrees C (68 and 77 degrees F). Keep container tightly closed. NOTE: This sheet is a summary. It may not cover all possible information. If you have questions about this medicine, talk to your doctor, pharmacist, or health care provider.  2018 Elsevier/Gold Standard (2015-09-01 12:53:15) Exercising to Lose Weight Exercising can help you to lose weight. In order to lose weight through exercise, you need to do vigorous-intensity exercise. You can tell that you are exercising with vigorous intensity if you are breathing very hard and fast and cannot  hold a conversation while exercising. Moderate-intensity exercise helps to maintain your current weight. You can tell that you are exercising at a moderate level if you have a higher heart rate and faster breathing, but you are still able to hold a conversation. How often should I exercise? Choose an activity that you enjoy and set realistic goals. Your health care provider can help you to make an activity plan that works for you. Exercise regularly as directed by your health care provider. This may include:  Doing resistance training twice each week, such as: ? Push-ups. ? Sit-ups. ? Lifting weights. ? Using resistance bands.  Doing a given intensity of exercise for a given amount of time. Choose from these options: ? 150 minutes of moderate-intensity exercise every week. ? 75 minutes of vigorous-intensity exercise every week. ? A mix of moderate-intensity and vigorous-intensity exercise every week.  Children, pregnant women, people who are out of shape, people who are overweight, and older adults may need to consult a health care provider for individual recommendations. If you have any sort of medical condition, be sure to consult your health care provider before starting a new exercise program. What are some activities that can help me to lose weight?  Walking at a rate of at least 4.5 miles an hour.  Jogging or running at a rate of 5 miles per hour.  Biking at a rate of at least 10 miles per hour.  Lap swimming.  Roller-skating or in-line skating.  Cross-country skiing.  Vigorous competitive sports, such  as football, basketball, and soccer.  Jumping rope.  Aerobic dancing. How can I be more active in my day-to-day activities?  Use the stairs instead of the elevator.  Take a walk during your lunch break.  If you drive, park your car farther away from work or school.  If you take public transportation, get off one stop early and walk the rest of the way.  Make all of  your phone calls while standing up and walking around.  Get up, stretch, and walk around every 30 minutes throughout the day. What guidelines should I follow while exercising?  Do not exercise so much that you hurt yourself, feel dizzy, or get very short of breath.  Consult your health care provider prior to starting a new exercise program.  Wear comfortable clothes and shoes with good support.  Drink plenty of water while you exercise to prevent dehydration or heat stroke. Body water is lost during exercise and must be replaced.  Work out until you breathe faster and your heart beats faster. This information is not intended to replace advice given to you by your health care provider. Make sure you discuss any questions you have with your health care provider. Document Released: 12/28/2010 Document Revised: 05/02/2016 Document Reviewed: 04/28/2014 Elsevier Interactive Patient Education  [employer].

## 2018-10-07 NOTE — Telephone Encounter (Signed)
Prior authorization for phentermine has been approved [facility] notified

## 2018-10-07 NOTE — Progress Notes (Signed)
[facility] [place], [place] [address]  Internal MEDICINE  Office Visit Note  Patient Name: Stephanie Gates  [id number]  [id number]  Date of Service: 10/08/2018  Chief Complaint  Patient presents with  . Arthritis  . Anemia  . Annual Exam  . Hypertension     HPI Pt is here for routine health maintenance examination.  She is a 52 year old obese African-American female.  She currently works for [employer] and parents.  She is here today for a yearly physical with Pap exam.  She has a history of arthritis, anemia, GERD, and hypertension.  She denies any current issues, however she does report that she has been feeling fatigued and tired as of recent.  She reports that in the past her anemia had made her feel this way.  We will check yearly lab work at this visit.  She was seen back in April for neck pain at our practice, she had been unable to get the MRI due to finances.  She does continue to report some neck pain, however it is much less when using the meloxicam.  She is currently requesting a refill.  The patient is concerned about her current weight.  She had in the past had used phentermine to help her lose weight, and would like to start that at this time.  Patient also needs a physician statement from [place] for her work insurance company due to her not being able to control her BMI.  That letter will be provided at this visit.  Current Medication: Outpatient Encounter Medications as of 10/07/2018  Medication Sig Note  . Cyanocobalamin (VITAMIN B-12 PO) Take by mouth.   . cyclobenzaprine (FLEXERIL) 10 MG tablet TAKE ONE TAB PO QHS FOR NECK PAIN PRN   . hydrochlorothiazide (MICROZIDE) 12.5 MG capsule Take 1 capsule (12.5 mg total) by mouth daily. PM   . meclizine (ANTIVERT) 12.5 MG tablet Take 1 tablet (12.5 mg total) by mouth 2 (two) times daily as needed for dizziness.   . meloxicam (MOBIC) 15 MG tablet Take one tab po qam as needed for  pain   . Multiple Vitamin (MULTIVITAMIN) capsule Take 1 capsule by mouth daily.   . pantoprazole (PROTONIX) 40 MG tablet  08/30/2016: Received from: External Pharmacy  . [DISCONTINUED] meloxicam (MOBIC) 15 MG tablet Take one tab po qam as needed for pain   . phentermine 37.5 MG capsule Take 1 capsule (37.5 mg total) by mouth every morning.   . [DISCONTINUED] Phendimetrazine Tartrate 105 MG CP24 Take 1 capsule by mouth daily.    No facility-administered encounter medications on file as of 10/07/2018.     Surgical History: Past Surgical History:  Procedure Laterality Date  . COLONOSCOPY N/A 05/03/2015   Procedure: COLONOSCOPY;  Surgeon: Lucilla Lame, MD;  Location: [facility];  Service: Gastroenterology;  Laterality: N/A;  . ESOPHAGOGASTRODUODENOSCOPY N/A 05/03/2015   Procedure: ESOPHAGOGASTRODUODENOSCOPY (EGD);  Surgeon: Lucilla Lame, MD;  Location: [facility];  Service: Gastroenterology;  Laterality: N/A;  . FOOT SURGERY  2014  . Gastric bypass surgery    . POLYPECTOMY  05/03/2015   Procedure: POLYPECTOMY INTESTINAL;  Surgeon: Lucilla Lame, MD;  Location: [facility];  Service: Gastroenterology;;    Medical History: Past Medical History:  Diagnosis Date  . Allergy    seasonal  . Anemia   . Arthritis    right hip  . Headache    sinus/allergies  . Hypertension   . Seasonal allergies   .  Vertigo    1-2x/month  . Wears contact lenses     Family History: Family History  Problem Relation Age of Onset  . Diabetes Mother   . Hypertension Mother   . Cancer Father   . Diabetes Father   . Hypertension Father       Review of Systems  Constitutional: Negative for chills, fatigue and unexpected weight change.  HENT: Negative for congestion, rhinorrhea, sneezing and sore throat.   Eyes: Negative for photophobia, pain and redness.  Respiratory: Negative for cough, chest tightness and shortness of breath.   Cardiovascular: Negative for chest pain and  palpitations.  Gastrointestinal: Negative for abdominal pain, constipation, diarrhea, nausea and vomiting.  Endocrine: Negative.   Genitourinary: Negative for dysuria and frequency.  Musculoskeletal: Negative for arthralgias, back pain, joint swelling and neck pain.  Skin: Negative for rash.  Allergic/Immunologic: Negative.   Neurological: Negative for tremors and numbness.  Hematological: Negative for adenopathy. Does not bruise/bleed easily.  Psychiatric/Behavioral: Negative for behavioral problems and sleep disturbance. The patient is not nervous/anxious.      Vital Signs: BP 130/78   Pulse 96   Resp 16   Ht 5\' 5"  (1.651 m)   Wt (!) 315 lb (142.9 kg)   SpO2 99%   BMI 52.42 kg/m    Physical Exam  Constitutional: She is oriented to person, place, and time. She appears well-developed and well-nourished. No distress.  HENT:  Head: Normocephalic and atraumatic.  Mouth/Throat: Oropharynx is clear and moist. No oropharyngeal exudate.  Eyes: Pupils are equal, round, and reactive to light. EOM are normal.  Neck: Normal range of motion. Neck supple. No JVD present. No tracheal deviation present. No thyromegaly present.  Cardiovascular: Normal rate, regular rhythm and normal heart sounds. Exam reveals no gallop and no friction rub.  No murmur heard. Pulmonary/Chest: Effort normal and breath sounds normal. No respiratory distress. She has no wheezes. She has no rales. She exhibits no tenderness. No breast swelling, tenderness, discharge or bleeding. Breasts are symmetrical.  Exam Chaperoned by Dorna Bloom CMA  Abdominal: Soft. There is no tenderness. There is no guarding.  Genitourinary: Rectum normal, vagina normal and uterus normal. No breast swelling, tenderness, discharge or bleeding. Pelvic exam was performed with patient supine. There is no rash, tenderness, lesion or injury on the right labia. There is no rash, tenderness, lesion or injury on the left labia. Cervix exhibits no  motion tenderness, no discharge and no friability. Right adnexum displays no mass, no tenderness and no fullness. Left adnexum displays no mass, no tenderness and no fullness.  Genitourinary Comments: Exam Chaperoned by Dorna Bloom CMA  Musculoskeletal: Normal range of motion.  Lymphadenopathy:    She has no cervical adenopathy.  Neurological: She is alert and oriented to person, place, and time. No cranial nerve deficit.  Skin: Skin is warm and dry. She is not diaphoretic.  Psychiatric: She has a normal mood and affect. Her behavior is normal. Judgment and thought content normal.  Nursing note and vitals reviewed.    LABS: Recent Results (from the past 2160 hour(s))  UA/M w/rflx Culture, Routine     Status: None   Collection Time: 10/07/18  2:19 AM  Result Value Ref Range   Specific Gravity, UA 1.025 1.005 - 1.030   pH, UA 5.0 5.0 - 7.5   Color, UA Yellow Yellow   Appearance Ur Clear Clear   Leukocytes, UA Negative Negative   Protein, UA Negative Negative/Trace   Glucose, UA Negative Negative  Ketones, UA Negative Negative   RBC, UA Negative Negative   Bilirubin, UA Negative Negative   Urobilinogen, Ur 0.2 0.2 - 1.0 mg/dL   Nitrite, UA Negative Negative   Microscopic Examination Comment     Comment: Microscopic follows if indicated.   Microscopic Examination See below:     Comment: Microscopic was indicated and was performed.   Urinalysis Reflex Comment     Comment: This specimen will not reflex to a Urine Culture.  Microscopic Examination     Status: None   Collection Time: 10/07/18  2:19 AM  Result Value Ref Range   WBC, UA 0-5 0 - 5 /hpf   RBC, UA 0-2 0 - 2 /hpf   Epithelial Cells (non renal) 0-10 0 - 10 /hpf   Casts None seen None seen /lpf   Mucus, UA Present Not Estab.   Bacteria, UA None seen None seen/Few    Assessment/Plan: 1. Encounter for general adult medical examination with abnormal findings Patient up-to-date on preventative maintenance. - UA/M w/rflx  Culture, Routine - CBC with Differential/Platelet - Lipid Panel With LDL/HDL Ratio - TSH - T4, free - Comprehensive metabolic panel  2. Routine cervical smear Patient's Pap smear performed at this time, will evaluate results when available. - Pap IG and HPV (high risk) DNA detection  3. Neck pain Refilled Meloxicam, discussed medication safety with patient.  - meloxicam (MOBIC) 15 MG tablet; Take one tab po qam as needed for pain  Dispense: 30 tablet; Refill: 6  4. Morbid (severe) obesity due to excess calories (HCC) Refilled patients phentermine.  She denies side effects.  - phentermine 37.5 MG capsule; Take 1 capsule (37.5 mg total) by mouth every morning.  Dispense: 30 capsule; Refill: 0  There is a liability release in patients' chart. There has been a 10 minute discussion about the side effects including but not limited to elevated blood pressure, anxiety, lack of sleep and dry mouth. Pt understands and will like to start/continue on appetite suppressant at this time. There will be one month RX given at the time of visit with proper follow up. Nova diet plan with restricted calories is given to the pt. Pt understands and agrees with  plan of treatment  Obesity Counseling: Risk Assessment: An assessment of behavioral risk factors was made today and includes lack of exercise sedentary lifestyle, lack of portion control and poor dietary habits.  Risk Modification Advice: She was counseled on portion control guidelines. Restricting daily caloric intake to. . The detrimental long term effects of obesity on her health and ongoing poor compliance was also discussed with the patient.  Refilled Controlled medications today. Reviewed risks and possible side effects associated with taking Stimulants. Combination of these drugs with other psychotropic medications could cause dizziness and drowsiness. Pt needs to Monitor symptoms and exercise caution in driving and operating heavy machinery to  avoid damages to oneself, to others and to the surroundings. Patient verbalized understanding in this matter. Dependence and abuse for these drugs will be monitored closely. A Controlled substance policy and procedure is on file which allows [facility] to order a urine drug screen test at any visit. Patient understands and agrees with the plan..  5. Gastro-esophageal reflux disease without esophagitis Patient reports good control of her GERD symptoms using Protonix.  Encourage patient to continue medication use.  6. Fatigue, unspecified type Will order fatigue labs and evaluate as results available. - Fe+TIBC+Fer - B12 and Folate Panel - Vitamin D 1,25 dihydroxy  7. Iron deficiency anemia, unspecified iron deficiency anemia type Patient will have labs drawn to evaluate for anemia.  She reports history of iron deficiency anemia in the past.  General Counseling: Rudean Curt understanding of the findings of todays visit and agrees with plan of treatment. I have discussed any further diagnostic evaluation that may be needed or ordered today. We also reviewed her medications today. she has been encouraged to call the office with any questions or concerns that should arise related to todays visit.   Orders Placed This Encounter  Procedures  . Microscopic Examination  . UA/M w/rflx Culture, Routine  . CBC with Differential/Platelet  . Lipid Panel With LDL/HDL Ratio  . TSH  . T4, free  . Comprehensive metabolic panel  . Fe+TIBC+Fer  . B12 and Folate Panel  . Vitamin D 1,25 dihydroxy  . NuSwab Vaginitis Plus (VG+)    Meds ordered this encounter  Medications  . meloxicam (MOBIC) 15 MG tablet    Sig: Take one tab po qam as needed for pain    Dispense:  30 tablet    Refill:  6  . phentermine 37.5 MG capsule    Sig: Take 1 capsule (37.5 mg total) by mouth every morning.    Dispense:  30 capsule    Refill:  0    Time spent: 35 Minutes   This patient was seen by Orson Gear AGNP-C in Collaboration with Dr Lavera Guise as a part of collaborative care agreement    Kendell Bane AGNP-C Internal Medicine

## 2018-10-08 LAB — UA/M W/RFLX CULTURE, ROUTINE
Bilirubin, UA: NEGATIVE
Glucose, UA: NEGATIVE
KETONES UA: NEGATIVE
Leukocytes, UA: NEGATIVE
NITRITE UA: NEGATIVE
PH UA: 5 (ref 5.0–7.5)
PROTEIN UA: NEGATIVE
RBC UA: NEGATIVE
SPEC GRAV UA: 1.025 (ref 1.005–1.030)
Urobilinogen, Ur: 0.2 mg/dL (ref 0.2–1.0)

## 2018-10-08 LAB — MICROSCOPIC EXAMINATION
Bacteria, UA: NONE SEEN
CASTS: NONE SEEN /LPF

## 2018-10-09 LAB — NUSWAB VAGINITIS PLUS (VG+)
Candida albicans, NAA: NEGATIVE
Candida glabrata, NAA: NEGATIVE
Chlamydia trachomatis, NAA: NEGATIVE
Neisseria gonorrhoeae, NAA: NEGATIVE
Trich vag by NAA: NEGATIVE

## 2018-10-12 LAB — PAP IG AND HPV HIGH-RISK: HPV, HIGH-RISK: NEGATIVE

## 2018-11-04 ENCOUNTER — Ambulatory Visit: Payer: Self-pay | Admitting: Adult Health

## 2018-11-09 ENCOUNTER — Encounter: Payer: Self-pay | Admitting: Adult Health

## 2018-11-09 ENCOUNTER — Ambulatory Visit: Payer: Managed Care, Other (non HMO) | Admitting: Adult Health

## 2018-11-09 VITALS — BP 128/82 | HR 80 | Resp 16 | Ht 65.0 in | Wt 308.0 lb

## 2018-11-09 DIAGNOSIS — R5383 Other fatigue: Secondary | ICD-10-CM

## 2018-11-09 DIAGNOSIS — K219 Gastro-esophageal reflux disease without esophagitis: Secondary | ICD-10-CM | POA: Diagnosis not present

## 2018-11-09 MED ORDER — PHENTERMINE HCL 37.5 MG PO CAPS: 37.5000 mg | ORAL_CAPSULE | ORAL | 0 refills | Status: DC

## 2018-11-09 NOTE — Progress Notes (Signed)
[facility] [place], [place] [address]  Internal MEDICINE  Office Visit Note  Patient Name: Stephanie Gates  [id number]  [id number]  Date of Service: 11/09/2018  Chief Complaint  Patient presents with  . Medical Management of Chronic Issues    weight loss management     HPI  Patient is here for follow-up of medical management of weight loss.  Patient has been taking phentermine 37.5 mg.  She posted an 8 pound weight loss in the last 2 months.  She is very pleased with this and would like to continue the medication at this time.  We spent some time discussing dietary changes as well as exercise.  She verbalized understanding that she needed to do some light exercising to start helping herself lose even more weight.   Current Medication: Outpatient Encounter Medications as of 11/09/2018  Medication Sig Note  . Cyanocobalamin (VITAMIN B-12 PO) Take by mouth.   . cyclobenzaprine (FLEXERIL) 10 MG tablet TAKE ONE TAB PO QHS FOR NECK PAIN PRN   . hydrochlorothiazide (MICROZIDE) 12.5 MG capsule Take 1 capsule (12.5 mg total) by mouth daily. PM   . meclizine (ANTIVERT) 12.5 MG tablet Take 1 tablet (12.5 mg total) by mouth 2 (two) times daily as needed for dizziness.   . meloxicam (MOBIC) 15 MG tablet Take one tab po qam as needed for pain   . Multiple Vitamin (MULTIVITAMIN) capsule Take 1 capsule by mouth daily.   . pantoprazole (PROTONIX) 40 MG tablet  08/30/2016: Received from: External Pharmacy  . phentermine 37.5 MG capsule Take 1 capsule (37.5 mg total) by mouth every morning.    No facility-administered encounter medications on file as of 11/09/2018.     Surgical History: Past Surgical History:  Procedure Laterality Date  . COLONOSCOPY N/A 05/03/2015   Procedure: COLONOSCOPY;  Surgeon: Lucilla Lame, MD;  Location: [place];  Service: Gastroenterology;  Laterality: N/A;  . ESOPHAGOGASTRODUODENOSCOPY N/A 05/03/2015   Procedure:  ESOPHAGOGASTRODUODENOSCOPY (EGD);  Surgeon: Lucilla Lame, MD;  Location: [place];  Service: Gastroenterology;  Laterality: N/A;  . FOOT SURGERY  2014  . Gastric bypass surgery    . POLYPECTOMY  05/03/2015   Procedure: POLYPECTOMY INTESTINAL;  Surgeon: Lucilla Lame, MD;  Location: [place];  Service: Gastroenterology;;    Medical History: Past Medical History:  Diagnosis Date  . Allergy    seasonal  . Anemia   . Arthritis    right hip  . Headache    sinus/allergies  . Hypertension   . Seasonal allergies   . Vertigo    1-2x/month  . Wears contact lenses     Family History: Family History  Problem Relation Age of Onset  . Diabetes Mother   . Hypertension Mother   . Cancer Father   . Diabetes Father   . Hypertension Father     Social History   Socioeconomic History  . Marital status: Single    Spouse name: Not on file  . Number of children: Not on file  . Years of education: Not on file  . Highest education level: Not on file  Occupational History  . Not on file  Social Needs  . Financial resource strain: Not on file  . Food insecurity:    Worry: Not on file    Inability: Not on file  . Transportation needs:    Medical: Not on file    Non-medical: Not on file  Tobacco Use  . Smoking status: Never  Smoker  . Smokeless tobacco: Never Used  Substance and Sexual Activity  . Alcohol use: No  . Drug use: No  . Sexual activity: Not on file  Lifestyle  . Physical activity:    Days per week: Not on file    Minutes per session: Not on file  . Stress: Not on file  Relationships  . Social connections:    Talks on phone: Not on file    Gets together: Not on file    Attends religious service: Not on file    Active member of club or organization: Not on file    Attends meetings of clubs or organizations: Not on file    Relationship status: Not on file  . Intimate partner violence:    Fear of current or ex partner: Not on file    Emotionally  abused: Not on file    Physically abused: Not on file    Forced sexual activity: Not on file  Other Topics Concern  . Not on file  Social History Narrative  . Not on file      Review of Systems  Constitutional: Negative for chills, fatigue and unexpected weight change.  HENT: Negative for congestion, rhinorrhea, sneezing and sore throat.   Eyes: Negative for photophobia, pain and redness.  Respiratory: Negative for cough, chest tightness and shortness of breath.   Cardiovascular: Negative for chest pain and palpitations.  Gastrointestinal: Negative for abdominal pain, constipation, diarrhea, nausea and vomiting.  Endocrine: Negative.   Genitourinary: Negative for dysuria and frequency.  Musculoskeletal: Negative for arthralgias, back pain, joint swelling and neck pain.  Skin: Negative for rash.  Allergic/Immunologic: Negative.   Neurological: Negative for tremors and numbness.  Hematological: Negative for adenopathy. Does not bruise/bleed easily.  Psychiatric/Behavioral: Negative for behavioral problems and sleep disturbance. The patient is not nervous/anxious.     Vital Signs: BP 128/82 (BP Location: Left Arm, Patient Position: Sitting, Cuff Size: Normal)   Pulse 80   Resp 16   Ht 5\' 5"  (1.651 m)   Wt (!) 308 lb (139.7 kg)   SpO2 95%   BMI 51.25 kg/m    Physical Exam  Constitutional: She is oriented to person, place, and time. She appears well-developed and well-nourished. No distress.  HENT:  Head: Normocephalic and atraumatic.  Mouth/Throat: Oropharynx is clear and moist. No oropharyngeal exudate.  Eyes: Pupils are equal, round, and reactive to light. EOM are normal.  Neck: Normal range of motion. Neck supple. No JVD present. No tracheal deviation present. No thyromegaly present.  Cardiovascular: Normal rate, regular rhythm and normal heart sounds. Exam reveals no gallop and no friction rub.  No murmur heard. Pulmonary/Chest: Effort normal and breath sounds normal.  No respiratory distress. She has no wheezes. She has no rales. She exhibits no tenderness.  Abdominal: Soft. There is no tenderness. There is no guarding.  Musculoskeletal: Normal range of motion.  Lymphadenopathy:    She has no cervical adenopathy.  Neurological: She is alert and oriented to person, place, and time. No cranial nerve deficit.  Skin: Skin is warm and dry. She is not diaphoretic.  Psychiatric: She has a normal mood and affect. Her behavior is normal. Judgment and thought content normal.  Nursing note and vitals reviewed.  Assessment/Plan: 1. Gastro-esophageal reflux disease without esophagitis Stable continue current medication.  2. Morbid (severe) obesity due to excess calories (HCC) Obesity Counseling: Risk Assessment: An assessment of behavioral risk factors was made today and includes lack of exercise sedentary lifestyle, lack  of portion control and poor dietary habits.  Risk Modification Advice: She was counseled on portion control guidelines. Restricting daily caloric intake to. . The detrimental long term effects of obesity on her health and ongoing poor compliance was also discussed with the patient.  There is a liability release in patients' chart. There has been a 10 minute discussion about the side effects including but not limited to elevated blood pressure, anxiety, lack of sleep and dry mouth. Pt understands and will like to start/continue on appetite suppressant at this time. There will be one month RX given at the time of visit with proper follow up. Nova diet plan with restricted calories is given to the pt. Pt understands and agrees with  plan of treatment - phentermine 37.5 MG capsule; Take 1 capsule (37.5 mg total) by mouth every morning.  Dispense: 30 capsule; Refill: 0 Refilled Controlled medications today. Reviewed risks and possible side effects associated with taking Stimulants. Combination of these drugs with other psychotropic medications could cause  dizziness and drowsiness. Pt needs to Monitor symptoms and exercise caution in driving and operating heavy machinery to avoid damages to oneself, to others and to the surroundings. Patient verbalized understanding in this matter. Dependence and abuse for these drugs will be monitored closely. A Controlled substance policy and procedure is on file which allows [facility] to order a urine drug screen test at any visit. Patient understands and agrees with the plan..  3. Fatigue, unspecified type Appears to be improving at this time.  We will continue to monitor.  General Counseling: sheina mcleish understanding of the findings of todays visit and agrees with plan of treatment. I have discussed any further diagnostic evaluation that may be needed or ordered today. We also reviewed her medications today. she has been encouraged to call the office with any questions or concerns that should arise related to todays visit.    No orders of the defined types were placed in this encounter.   No orders of the defined types were placed in this encounter.   Time spent: 25 Minutes   This patient was seen by Orson Gear AGNP-C in Collaboration with Dr Lavera Guise as a part of collaborative care agreement     Kendell Bane AGNP-C Internal medicine

## 2018-11-09 NOTE — Patient Instructions (Signed)

## 2018-12-22 ENCOUNTER — Encounter: Payer: Self-pay | Admitting: Nurse Practitioner

## 2018-12-22 ENCOUNTER — Ambulatory Visit: Payer: Managed Care, Other (non HMO) | Admitting: Nurse Practitioner

## 2018-12-22 VITALS — BP 140/80 | HR 94 | Resp 16 | Ht 65.0 in | Wt 307.0 lb

## 2018-12-22 DIAGNOSIS — M542 Cervicalgia: Secondary | ICD-10-CM | POA: Diagnosis not present

## 2018-12-22 DIAGNOSIS — I10 Essential (primary) hypertension: Secondary | ICD-10-CM

## 2018-12-22 MED ORDER — PHENTERMINE HCL 37.5 MG PO TABS: 37.5000 mg | ORAL_TABLET | Freq: Every day | ORAL | 1 refills | Status: DC

## 2018-12-22 MED ORDER — IBUPROFEN 800 MG PO TABS: 800.0000 mg | ORAL_TABLET | Freq: Three times a day (TID) | ORAL | 3 refills | Status: DC | PRN

## 2018-12-22 NOTE — Progress Notes (Signed)
[facility] [place], [place] [address]  Internal MEDICINE  Office Visit Note  Patient Name: Stephanie Gates  [id number]  [id number]  Date of Service: 12/30/2018  Chief Complaint  Patient presents with  . Medical Management of Chronic Issues    weight  management  . Hypertension    The patient is here for follow up for weight management. She is currently taking phentermine. Has been off of this for 2 weeks. Has gone on a cruise to the [place]. Still has lost one pounds since her last visit. Does have dry mouth associated with taking this medication but no other negative side effects. She would like to restart the phentermine.  Continues to have severe neck pain. Has seen orthopedics for thi. Prescribed meloxicam to help pain during the day. States this is really not helping and she does better with higher dose of ibuprofen. Would like to switch back if possible. Will need to be seen per orthopedics again as they had ordered MRI for further evaluation. She was unable to get this done due to expense.       Current Medication: Outpatient Encounter Medications as of 12/22/2018  Medication Sig Note  . Cyanocobalamin (VITAMIN B-12 PO) Take by mouth.   . cyclobenzaprine (FLEXERIL) 10 MG tablet TAKE ONE TAB PO QHS FOR NECK PAIN PRN   . hydrochlorothiazide (MICROZIDE) 12.5 MG capsule Take 1 capsule (12.5 mg total) by mouth daily. PM   . meclizine (ANTIVERT) 12.5 MG tablet Take 1 tablet (12.5 mg total) by mouth 2 (two) times daily as needed for dizziness.   . Multiple Vitamin (MULTIVITAMIN) capsule Take 1 capsule by mouth daily.   . pantoprazole (PROTONIX) 40 MG tablet  08/30/2016: Received from: External Pharmacy  . [DISCONTINUED] meloxicam (MOBIC) 15 MG tablet Take one tab po qam as needed for pain   . [DISCONTINUED] phentermine 37.5 MG capsule Take 1 capsule (37.5 mg total) by mouth every morning.   Marland Kitchen ibuprofen (ADVIL,MOTRIN) 800 MG tablet Take 1 tablet (800 mg  total) by mouth every 8 (eight) hours as needed.   . phentermine (ADIPEX-P) 37.5 MG tablet Take 1 tablet (37.5 mg total) by mouth daily before breakfast.    No facility-administered encounter medications on file as of 12/22/2018.     Surgical History: Past Surgical History:  Procedure Laterality Date  . COLONOSCOPY N/A 05/03/2015   Procedure: COLONOSCOPY;  Surgeon: Lucilla Lame, MD;  Location: [place];  Service: Gastroenterology;  Laterality: N/A;  . ESOPHAGOGASTRODUODENOSCOPY N/A 05/03/2015   Procedure: ESOPHAGOGASTRODUODENOSCOPY (EGD);  Surgeon: Lucilla Lame, MD;  Location: [place];  Service: Gastroenterology;  Laterality: N/A;  . FOOT SURGERY  2014  . Gastric bypass surgery    . POLYPECTOMY  05/03/2015   Procedure: POLYPECTOMY INTESTINAL;  Surgeon: Lucilla Lame, MD;  Location: [place];  Service: Gastroenterology;;    Medical History: Past Medical History:  Diagnosis Date  . Allergy    seasonal  . Anemia   . Arthritis    right hip  . Headache    sinus/allergies  . Hypertension   . Seasonal allergies   . Vertigo    1-2x/month  . Wears contact lenses     Family History: Family History  Problem Relation Age of Onset  . Diabetes Mother   . Hypertension Mother   . Cancer Father   . Diabetes Father   . Hypertension Father     Social History   Socioeconomic History  . Marital status:  Single    Spouse name: Not on file  . Number of children: Not on file  . Years of education: Not on file  . Highest education level: Not on file  Occupational History  . Not on file  Social Needs  . Financial resource strain: Not on file  . Food insecurity:    Worry: Not on file    Inability: Not on file  . Transportation needs:    Medical: Not on file    Non-medical: Not on file  Tobacco Use  . Smoking status: Never Smoker  . Smokeless tobacco: Never Used  Substance and Sexual Activity  . Alcohol use: No  . Drug use: No  . Sexual activity: Not  on file  Lifestyle  . Physical activity:    Days per week: Not on file    Minutes per session: Not on file  . Stress: Not on file  Relationships  . Social connections:    Talks on phone: Not on file    Gets together: Not on file    Attends religious service: Not on file    Active member of club or organization: Not on file    Attends meetings of clubs or organizations: Not on file    Relationship status: Not on file  . Intimate partner violence:    Fear of current or ex partner: Not on file    Emotionally abused: Not on file    Physically abused: Not on file    Forced sexual activity: Not on file  Other Topics Concern  . Not on file  Social History Narrative  . Not on file      Review of Systems  Constitutional: Negative for chills, fatigue and unexpected weight change.       Weight loss of one pound since her last visit.   HENT: Negative for congestion, postnasal drip, rhinorrhea, sneezing and sore throat.   Respiratory: Negative for cough, chest tightness, shortness of breath and wheezing.   Cardiovascular: Negative for chest pain and palpitations.  Gastrointestinal: Negative for abdominal pain, constipation, diarrhea, nausea and vomiting.  Endocrine: Negative for cold intolerance, heat intolerance, polydipsia and polyuria.  Genitourinary: Negative for dysuria and frequency.  Musculoskeletal: Positive for neck pain. Negative for arthralgias, back pain and joint swelling.  Skin: Negative for rash.  Allergic/Immunologic: Negative for environmental allergies.  Neurological: Negative for dizziness, tremors, numbness and headaches.  Hematological: Negative for adenopathy. Does not bruise/bleed easily.  Psychiatric/Behavioral: Negative for behavioral problems (Depression), sleep disturbance and suicidal ideas. The patient is not nervous/anxious.     Today's Vitals   12/22/18 1532  BP: 140/80  Pulse: 94  Resp: 16  SpO2: 99%  Weight: (!) 307 lb (139.3 kg)  Height: 5\' 5"   (1.651 m)     Physical Exam Vitals signs and nursing note reviewed.  Constitutional:      General: She is not in acute distress.    Appearance: She is well-developed. She is obese. She is not diaphoretic.  HENT:     Head: Normocephalic and atraumatic.     Mouth/Throat:     Pharynx: No oropharyngeal exudate.  Eyes:     Pupils: Pupils are equal, round, and reactive to light.  Neck:     Musculoskeletal: Normal range of motion and neck supple. Crepitus, pain with movement and muscular tenderness present.     Thyroid: No thyromegaly.     Vascular: No JVD.     Trachea: No tracheal deviation.  Cardiovascular:  Rate and Rhythm: Normal rate and regular rhythm.     Heart sounds: Normal heart sounds. No murmur. No friction rub. No gallop.   Pulmonary:     Effort: Pulmonary effort is normal. No respiratory distress.     Breath sounds: Normal breath sounds. No wheezing or rales.  Chest:     Chest wall: No tenderness.  Abdominal:     General: Bowel sounds are normal.     Palpations: Abdomen is soft.  Musculoskeletal: Normal range of motion.  Lymphadenopathy:     Cervical: No cervical adenopathy.  Skin:    General: Skin is warm and dry.  Neurological:     Mental Status: She is alert and oriented to person, place, and time.     Cranial Nerves: No cranial nerve deficit.  Psychiatric:        Behavior: Behavior normal.        Thought Content: Thought content normal.        Judgment: Judgment normal.    Assessment/Plan:  1. Neck pain D/c meloxicam. Change to ibuprofen 800mg  up to three times daily as needed for pain/inflammation. Continue to see orthopedics as needed.  - ibuprofen (ADVIL,MOTRIN) 800 MG tablet; Take 1 tablet (800 mg total) by mouth every 8 (eight) hours as needed.  Dispense: 90 tablet; Refill: 3  2. Morbid (severe) obesity due to excess calories [facility]) May continue phentermine 37.5mg  tablets daily. Limit calorie intake to 1500 calories per day and gradually  incorporate exercise into daily routine.  - phentermine (ADIPEX-P) 37.5 MG tablet; Take 1 tablet (37.5 mg total) by mouth daily before breakfast.  Dispense: 30 tablet; Refill: 1  3. Essential (primary) hypertension Stable. Continue bp medication as prescribed   General Counseling: neelie welshans understanding of the findings of todays visit and agrees with plan of treatment. I have discussed any further diagnostic evaluation that may be needed or ordered today. We also reviewed her medications today. she has been encouraged to call the office with any questions or concerns that should arise related to todays visit.   There is a liability release in patients' chart. There has been a 10 minute discussion about the side effects including but not limited to elevated blood pressure, anxiety, lack of sleep and dry mouth. Pt understands and will like to start/continue on appetite suppressant at this time. There will be one month RX given at the time of visit with proper follow up. Nova diet plan with restricted calories is given to the pt. Pt understands and agrees with  plan of treatment  This patient was seen by Leretha Pol FNP Collaboration with Dr Lavera Guise as a part of collaborative care agreement  Meds ordered this encounter  Medications  . ibuprofen (ADVIL,MOTRIN) 800 MG tablet    Sig: Take 1 tablet (800 mg total) by mouth every 8 (eight) hours as needed.    Dispense:  90 tablet    Refill:  3    Order Specific Question:   Supervising Provider    Answer:   Lavera Guise [[id number]]  . phentermine (ADIPEX-P) 37.5 MG tablet    Sig: Take 1 tablet (37.5 mg total) by mouth daily before breakfast.    Dispense:  30 tablet    Refill:  1    Order Specific Question:   Supervising Provider    Answer:   Lavera Guise [[id number]]    Time spent: 84 Minutes      Dr Lavera Guise Internal medicine

## 2018-12-30 DIAGNOSIS — M542 Cervicalgia: Secondary | ICD-10-CM | POA: Insufficient documentation

## 2019-02-01 ENCOUNTER — Ambulatory Visit: Payer: Managed Care, Other (non HMO) | Admitting: Nurse Practitioner

## 2019-02-01 ENCOUNTER — Encounter: Payer: Self-pay | Admitting: Nurse Practitioner

## 2019-02-01 VITALS — BP 158/77 | HR 80 | Resp 16 | Ht 65.0 in | Wt 306.6 lb

## 2019-02-01 DIAGNOSIS — I10 Essential (primary) hypertension: Secondary | ICD-10-CM

## 2019-02-01 NOTE — Progress Notes (Signed)
[facility] [place], [place] [address]  Internal MEDICINE  Office Visit Note  Patient Name: Stephanie Gates  [id number]  [id number]  Date of Service: 02/10/2019  Chief Complaint  Patient presents with  . Medical Management of Chronic Issues    6wk follow up weight management    The patient is here for follow up of weight management. Currently taking phentermine to help suppress appetite. She has increased exercise and is watching her diet. She has lost one pound. Her blood pressure is elevated today. She admits she has not taken her blood pressure medication in a few days. Makes her nave to void frequently and this is frowned upon at her place of employment. She spaces out her dosing of her medication so that she prevents this from happening.       Current Medication: Outpatient Encounter Medications as of 02/01/2019  Medication Sig Note  . Cyanocobalamin (VITAMIN B-12 PO) Take by mouth.   . cyclobenzaprine (FLEXERIL) 10 MG tablet TAKE ONE TAB PO QHS FOR NECK PAIN PRN   . hydrochlorothiazide (MICROZIDE) 12.5 MG capsule Take 1 capsule (12.5 mg total) by mouth daily. PM   . ibuprofen (ADVIL,MOTRIN) 800 MG tablet Take 1 tablet (800 mg total) by mouth every 8 (eight) hours as needed.   . meclizine (ANTIVERT) 12.5 MG tablet Take 1 tablet (12.5 mg total) by mouth 2 (two) times daily as needed for dizziness.   . Multiple Vitamin (MULTIVITAMIN) capsule Take 1 capsule by mouth daily.   . pantoprazole (PROTONIX) 40 MG tablet  08/30/2016: Received from: External Pharmacy  . phentermine (ADIPEX-P) 37.5 MG tablet Take 1 tablet (37.5 mg total) by mouth daily before breakfast.    No facility-administered encounter medications on file as of 02/01/2019.     Surgical History: Past Surgical History:  Procedure Laterality Date  . COLONOSCOPY N/A 05/03/2015   Procedure: COLONOSCOPY;  Surgeon: Lucilla Lame, MD;  Location: [place];  Service: Gastroenterology;   Laterality: N/A;  . ESOPHAGOGASTRODUODENOSCOPY N/A 05/03/2015   Procedure: ESOPHAGOGASTRODUODENOSCOPY (EGD);  Surgeon: Lucilla Lame, MD;  Location: [place];  Service: Gastroenterology;  Laterality: N/A;  . FOOT SURGERY  2014  . Gastric bypass surgery    . POLYPECTOMY  05/03/2015   Procedure: POLYPECTOMY INTESTINAL;  Surgeon: Lucilla Lame, MD;  Location: [place];  Service: Gastroenterology;;    Medical History: Past Medical History:  Diagnosis Date  . Allergy    seasonal  . Anemia   . Arthritis    right hip  . Headache    sinus/allergies  . Hypertension   . Seasonal allergies   . Vertigo    1-2x/month  . Wears contact lenses     Family History: Family History  Problem Relation Age of Onset  . Diabetes Mother   . Hypertension Mother   . Cancer Father   . Diabetes Father   . Hypertension Father     Social History   Socioeconomic History  . Marital status: Single    Spouse name: Not on file  . Number of children: Not on file  . Years of education: Not on file  . Highest education level: Not on file  Occupational History  . Not on file  Social Needs  . Financial resource strain: Not on file  . Food insecurity:    Worry: Not on file    Inability: Not on file  . Transportation needs:    Medical: Not on file    Non-medical:  Not on file  Tobacco Use  . Smoking status: Never Smoker  . Smokeless tobacco: Never Used  Substance and Sexual Activity  . Alcohol use: No  . Drug use: No  . Sexual activity: Not on file  Lifestyle  . Physical activity:    Days per week: Not on file    Minutes per session: Not on file  . Stress: Not on file  Relationships  . Social connections:    Talks on phone: Not on file    Gets together: Not on file    Attends religious service: Not on file    Active member of club or organization: Not on file    Attends meetings of clubs or organizations: Not on file    Relationship status: Not on file  . Intimate partner  violence:    Fear of current or ex partner: Not on file    Emotionally abused: Not on file    Physically abused: Not on file    Forced sexual activity: Not on file  Other Topics Concern  . Not on file  Social History Narrative  . Not on file      Review of Systems  Constitutional: Negative for chills, fatigue and unexpected weight change.       Weight loss of one pound since her last visit.   HENT: Negative for congestion, postnasal drip, rhinorrhea, sneezing and sore throat.   Respiratory: Negative for cough, chest tightness, shortness of breath and wheezing.   Cardiovascular: Negative for chest pain and palpitations.       Elevated blood pressure today.  Gastrointestinal: Negative for abdominal pain, constipation, diarrhea, nausea and vomiting.  Endocrine: Negative for cold intolerance, heat intolerance, polydipsia and polyuria.  Musculoskeletal: Negative for arthralgias, back pain, joint swelling and neck pain.  Skin: Negative for rash.  Allergic/Immunologic: Negative for environmental allergies.  Neurological: Negative for dizziness, tremors, numbness and headaches.  Hematological: Negative for adenopathy. Does not bruise/bleed easily.  Psychiatric/Behavioral: Negative for behavioral problems (Depression), sleep disturbance and suicidal ideas. The patient is not nervous/anxious.     Today's Vitals   02/01/19 1628  BP: (!) 158/77  Pulse: 80  Resp: 16  SpO2: 99%  Weight: (!) 306 lb 9.6 oz (139.1 kg)  Height: 5\' 5"  (1.651 m)   Body mass index is 51.02 kg/m.  Physical Exam Vitals signs and nursing note reviewed.  Constitutional:      General: She is not in acute distress.    Appearance: She is well-developed. She is obese. She is not diaphoretic.  HENT:     Head: Normocephalic and atraumatic.     Mouth/Throat:     Pharynx: No oropharyngeal exudate.  Eyes:     Pupils: Pupils are equal, round, and reactive to light.  Neck:     Musculoskeletal: Normal range of  motion and neck supple. Crepitus and pain with movement present.     Thyroid: No thyromegaly.     Vascular: No JVD.     Trachea: No tracheal deviation.  Cardiovascular:     Rate and Rhythm: Normal rate and regular rhythm.     Heart sounds: Normal heart sounds. No murmur. No friction rub. No gallop.   Pulmonary:     Effort: Pulmonary effort is normal. No respiratory distress.     Breath sounds: Normal breath sounds. No wheezing or rales.  Chest:     Chest wall: No tenderness.  Abdominal:     General: Bowel sounds are normal.  Palpations: Abdomen is soft.  Musculoskeletal: Normal range of motion.  Lymphadenopathy:     Cervical: No cervical adenopathy.  Skin:    General: Skin is warm and dry.  Neurological:     Mental Status: She is alert and oriented to person, place, and time.     Cranial Nerves: No cranial nerve deficit.  Psychiatric:        Behavior: Behavior normal.        Thought Content: Thought content normal.        Judgment: Judgment normal.    Assessment/Plan: 1. Essential (primary) hypertension Blood pressure elevated today as she has been taking her bp medication intermittently. Advised that she take this every day. She should also limit salt intake in her diet and increase water and exercise.   2. Morbid (severe) obesity due to excess calories ([place]) Will hold phentermine for now as blood pressure is elevated today. Advised she take BP medication every day. Will see back in two weeks and reassess ability to take appetite suppressant.   General Counseling: aerica rincon understanding of the findings of todays visit and agrees with plan of treatment. I have discussed any further diagnostic evaluation that may be needed or ordered today. We also reviewed her medications today. she has been encouraged to call the office with any questions or concerns that should arise related to todays visit.  Hypertension Counseling:   The following hypertensive lifestyle  modification were recommended and discussed:  1. Limiting alcohol intake to less than 1 oz/day of ethanol:(24 oz of beer or 8 oz of wine or 2 oz of 100-proof whiskey). 2. Take baby ASA 81 mg daily. 3. Importance of regular aerobic exercise and losing weight. 4. Reduce dietary saturated fat and cholesterol intake for overall cardiovascular health. 5. Maintaining adequate dietary potassium, calcium, and magnesium intake. 6. Regular monitoring of the blood pressure. 7. Reduce sodium intake to less than 100 mmol/day (less than 2.3 gm of sodium or less than 6 gm of sodium choride)   This patient was seen by [place] with Dr Lavera Guise as a part of collaborative care agreement  Time spent: 25 Minutes      Dr Lavera Guise Internal medicine

## 2019-02-09 ENCOUNTER — Encounter: Payer: Self-pay | Admitting: Nurse Practitioner

## 2019-02-09 ENCOUNTER — Ambulatory Visit: Payer: Managed Care, Other (non HMO) | Admitting: Nurse Practitioner

## 2019-02-09 VITALS — BP 160/82 | HR 85 | Resp 16 | Ht 65.0 in | Wt 308.6 lb

## 2019-02-09 DIAGNOSIS — I10 Essential (primary) hypertension: Secondary | ICD-10-CM

## 2019-02-09 NOTE — Progress Notes (Signed)
[facility] [place], [place] [address]  Internal MEDICINE  Office Visit Note  Patient Name: Stephanie Gates  [id number]  [id number]  Date of Service: 02/24/2019  Chief Complaint  Patient presents with  . Follow-up    bp check and weight management   . Hypertension    Blood pressure still elevated. Has not taken her fluid pill. This was same issue when seen last week. Would like to start on weight loss medications. Was asked to take blood pressure medication as prescribed prior to this visit, as blood pressure is good when she takes prescribed medications. States that she got busy with something last night and forgot to take the medication. She takes HCTZ 12.5mg  tablets in the evenings.   Hypertension  This is a chronic problem. The current episode started more than 1 year ago. The problem is unchanged. The problem is resistant. Associated symptoms include peripheral edema. Pertinent negatives include no chest pain, headaches, neck pain, palpitations or shortness of breath. Past treatments include diuretics.       Current Medication: Outpatient Encounter Medications as of 02/09/2019  Medication Sig Note  . Cyanocobalamin (VITAMIN B-12 PO) Take by mouth.   . cyclobenzaprine (FLEXERIL) 10 MG tablet TAKE ONE TAB PO QHS FOR NECK PAIN PRN   . hydrochlorothiazide (MICROZIDE) 12.5 MG capsule Take 1 capsule (12.5 mg total) by mouth daily. PM   . ibuprofen (ADVIL,MOTRIN) 800 MG tablet Take 1 tablet (800 mg total) by mouth every 8 (eight) hours as needed.   . meclizine (ANTIVERT) 12.5 MG tablet Take 1 tablet (12.5 mg total) by mouth 2 (two) times daily as needed for dizziness.   . Multiple Vitamin (MULTIVITAMIN) capsule Take 1 capsule by mouth daily.   . pantoprazole (PROTONIX) 40 MG tablet  08/30/2016: Received from: External Pharmacy  . [DISCONTINUED] phentermine (ADIPEX-P) 37.5 MG tablet Take 1 tablet (37.5 mg total) by mouth daily before breakfast.    No  facility-administered encounter medications on file as of 02/09/2019.     Surgical History: Past Surgical History:  Procedure Laterality Date  . COLONOSCOPY N/A 05/03/2015   Procedure: COLONOSCOPY;  Surgeon: Lucilla Lame, MD;  Location: [place];  Service: Gastroenterology;  Laterality: N/A;  . ESOPHAGOGASTRODUODENOSCOPY N/A 05/03/2015   Procedure: ESOPHAGOGASTRODUODENOSCOPY (EGD);  Surgeon: Lucilla Lame, MD;  Location: [place];  Service: Gastroenterology;  Laterality: N/A;  . FOOT SURGERY  2014  . Gastric bypass surgery    . POLYPECTOMY  05/03/2015   Procedure: POLYPECTOMY INTESTINAL;  Surgeon: Lucilla Lame, MD;  Location: [place];  Service: Gastroenterology;;    Medical History: Past Medical History:  Diagnosis Date  . Allergy    seasonal  . Anemia   . Arthritis    right hip  . Headache    sinus/allergies  . Hypertension   . Seasonal allergies   . Vertigo    1-2x/month  . Wears contact lenses     Family History: Family History  Problem Relation Age of Onset  . Diabetes Mother   . Hypertension Mother   . Cancer Father   . Diabetes Father   . Hypertension Father     Social History   Socioeconomic History  . Marital status: Single    Spouse name: Not on file  . Number of children: Not on file  . Years of education: Not on file  . Highest education level: Not on file  Occupational History  . Not on file  Social Needs  .  Financial resource strain: Not on file  . Food insecurity:    Worry: Not on file    Inability: Not on file  . Transportation needs:    Medical: Not on file    Non-medical: Not on file  Tobacco Use  . Smoking status: Never Smoker  . Smokeless tobacco: Never Used  Substance and Sexual Activity  . Alcohol use: No  . Drug use: No  . Sexual activity: Not on file  Lifestyle  . Physical activity:    Days per week: Not on file    Minutes per session: Not on file  . Stress: Not on file  Relationships  . Social  connections:    Talks on phone: Not on file    Gets together: Not on file    Attends religious service: Not on file    Active member of club or organization: Not on file    Attends meetings of clubs or organizations: Not on file    Relationship status: Not on file  . Intimate partner violence:    Fear of current or ex partner: Not on file    Emotionally abused: Not on file    Physically abused: Not on file    Forced sexual activity: Not on file  Other Topics Concern  . Not on file  Social History Narrative  . Not on file      Review of Systems  Constitutional: Negative for chills, fatigue and unexpected weight change.       Weight loss of one pound since her last visit.   HENT: Negative for congestion, postnasal drip, rhinorrhea, sneezing and sore throat.   Respiratory: Negative for cough, chest tightness, shortness of breath and wheezing.   Cardiovascular: Negative for chest pain and palpitations.       Elevated blood pressure today.  Gastrointestinal: Negative for abdominal pain, constipation, diarrhea, nausea and vomiting.  Endocrine: Negative for cold intolerance, heat intolerance, polydipsia and polyuria.  Musculoskeletal: Negative for arthralgias, back pain, joint swelling and neck pain.  Skin: Negative for rash.  Allergic/Immunologic: Negative for environmental allergies.  Neurological: Negative for dizziness, tremors, numbness and headaches.  Hematological: Negative for adenopathy. Does not bruise/bleed easily.  Psychiatric/Behavioral: Negative for behavioral problems (Depression), sleep disturbance and suicidal ideas. The patient is not nervous/anxious.     Today's Vitals   02/09/19 1542  BP: (!) 160/82  Pulse: 85  Resp: 16  SpO2: 99%  Weight: (!) 308 lb 9.6 oz (140 kg)  Height: 5\' 5"  (1.651 m)   Body mass index is 51.35 kg/m.  Physical Exam Vitals signs and nursing note reviewed.  Constitutional:      General: She is not in acute distress.    Appearance:  She is well-developed. She is obese. She is not diaphoretic.  HENT:     Head: Normocephalic and atraumatic.     Mouth/Throat:     Pharynx: No oropharyngeal exudate.  Eyes:     Pupils: Pupils are equal, round, and reactive to light.  Neck:     Musculoskeletal: Normal range of motion and neck supple. Crepitus and pain with movement present.     Thyroid: No thyromegaly.     Vascular: No JVD.     Trachea: No tracheal deviation.  Cardiovascular:     Rate and Rhythm: Normal rate and regular rhythm.     Heart sounds: Normal heart sounds. No murmur. No friction rub. No gallop.   Pulmonary:     Effort: Pulmonary effort is normal. No respiratory  distress.     Breath sounds: Normal breath sounds. No wheezing or rales.  Chest:     Chest wall: No tenderness.  Abdominal:     General: Bowel sounds are normal.     Palpations: Abdomen is soft.  Musculoskeletal: Normal range of motion.  Lymphadenopathy:     Cervical: No cervical adenopathy.  Skin:    General: Skin is warm and dry.  Neurological:     Mental Status: She is alert and oriented to person, place, and time.     Cranial Nerves: No cranial nerve deficit.  Psychiatric:        Behavior: Behavior normal.        Thought Content: Thought content normal.        Judgment: Judgment normal.   Assessment/Plan: 1. Essential (primary) hypertension Patient still not taking blood pressure medication, as it makes her have to urinate more frequently at work. Advised her to take medication as prescribed. Will recheck blood pressure in few days. If WNL, will consier adding stimulant appetite suppressant.   2. Morbid (severe) obesity due to excess calories [facility]) Patient still not taking blood pressure medication, as it makes her have to urinate more frequently at work. Advised her to take medication as prescribed. Will recheck blood pressure in few days. If WNL, will consier adding stimulant appetite suppressant.    General Counseling: marny smethers understanding of the findings of todays visit and agrees with plan of treatment. I have discussed any further diagnostic evaluation that may be needed or ordered today. We also reviewed her medications today. she has been encouraged to call the office with any questions or concerns that should arise related to todays visit.  Hypertension Counseling:   The following hypertensive lifestyle modification were recommended and discussed:  1. Limiting alcohol intake to less than 1 oz/day of ethanol:(24 oz of beer or 8 oz of wine or 2 oz of 100-proof whiskey). 2. Take baby ASA 81 mg daily. 3. Importance of regular aerobic exercise and losing weight. 4. Reduce dietary saturated fat and cholesterol intake for overall cardiovascular health. 5. Maintaining adequate dietary potassium, calcium, and magnesium intake. 6. Regular monitoring of the blood pressure. 7. Reduce sodium intake to less than 100 mmol/day (less than 2.3 gm of sodium or less than 6 gm of sodium choride)   This patient was seen by [place] with Dr Lavera Guise as a part of collaborative care agreement  Time spent: 55 Minutes      Dr Lavera Guise Internal medicine

## 2019-02-12 ENCOUNTER — Encounter: Payer: Self-pay | Admitting: Nurse Practitioner

## 2019-02-12 ENCOUNTER — Ambulatory Visit: Payer: Managed Care, Other (non HMO) | Admitting: Nurse Practitioner

## 2019-02-12 VITALS — BP 142/88 | HR 94 | Resp 16 | Ht 65.0 in | Wt 306.2 lb

## 2019-02-12 DIAGNOSIS — I10 Essential (primary) hypertension: Secondary | ICD-10-CM | POA: Diagnosis not present

## 2019-02-12 MED ORDER — PHENTERMINE HCL 37.5 MG PO TABS: 37.5000 mg | ORAL_TABLET | Freq: Every day | ORAL | 1 refills | Status: DC

## 2019-02-12 NOTE — Progress Notes (Signed)
Pt blood pressure is elevated tried taking it with the machine first no reading,  2nd attempt was manually 158/92

## 2019-02-12 NOTE — Progress Notes (Signed)
[facility] [place], [place] [address]  Internal MEDICINE  Office Visit Note  Patient Name: Stephanie Gates  [id number]  [id number]  Date of Service: 02/12/2019  Chief Complaint  Patient presents with  . Medical Management of Chronic Issues    Blood pressure follow up  . Hypertension    The patient is here for follow up of weight management. Was taking phentermine, but has been off this for a little while due to elevated blood pressure. . She has increased exercise and is watching her diet. She has lost one pound. Her blood pressure is improved today. Took her blood pressure medication this morning and is doing well. She would like to have a new prescription for pehtnermine and will continue with healthy diet and increased exercise.       Current Medication: Outpatient Encounter Medications as of 02/12/2019  Medication Sig Note  . Cyanocobalamin (VITAMIN B-12 PO) Take by mouth.   . cyclobenzaprine (FLEXERIL) 10 MG tablet TAKE ONE TAB PO QHS FOR NECK PAIN PRN   . hydrochlorothiazide (MICROZIDE) 12.5 MG capsule Take 1 capsule (12.5 mg total) by mouth daily. PM   . ibuprofen (ADVIL,MOTRIN) 800 MG tablet Take 1 tablet (800 mg total) by mouth every 8 (eight) hours as needed.   . meclizine (ANTIVERT) 12.5 MG tablet Take 1 tablet (12.5 mg total) by mouth 2 (two) times daily as needed for dizziness.   . Multiple Vitamin (MULTIVITAMIN) capsule Take 1 capsule by mouth daily.   . pantoprazole (PROTONIX) 40 MG tablet  08/30/2016: Received from: External Pharmacy  . phentermine (ADIPEX-P) 37.5 MG tablet Take 1 tablet (37.5 mg total) by mouth daily before breakfast.   . [DISCONTINUED] phentermine (ADIPEX-P) 37.5 MG tablet Take 1 tablet (37.5 mg total) by mouth daily before breakfast.    No facility-administered encounter medications on file as of 02/12/2019.     Surgical History: Past Surgical History:  Procedure Laterality Date  . COLONOSCOPY N/A 05/03/2015    Procedure: COLONOSCOPY;  Surgeon: Lucilla Lame, MD;  Location: [facility];  Service: Gastroenterology;  Laterality: N/A;  . ESOPHAGOGASTRODUODENOSCOPY N/A 05/03/2015   Procedure: ESOPHAGOGASTRODUODENOSCOPY (EGD);  Surgeon: Lucilla Lame, MD;  Location: [place];  Service: Gastroenterology;  Laterality: N/A;  . FOOT SURGERY  2014  . Gastric bypass surgery    . POLYPECTOMY  05/03/2015   Procedure: POLYPECTOMY INTESTINAL;  Surgeon: Lucilla Lame, MD;  Location: [place];  Service: Gastroenterology;;    Medical History: Past Medical History:  Diagnosis Date  . Allergy    seasonal  . Anemia   . Arthritis    right hip  . Headache    sinus/allergies  . Hypertension   . Seasonal allergies   . Vertigo    1-2x/month  . Wears contact lenses     Family History: Family History  Problem Relation Age of Onset  . Diabetes Mother   . Hypertension Mother   . Cancer Father   . Diabetes Father   . Hypertension Father     Social History   Socioeconomic History  . Marital status: Single    Spouse name: Not on file  . Number of children: Not on file  . Years of education: Not on file  . Highest education level: Not on file  Occupational History  . Not on file  Social Needs  . Financial resource strain: Not on file  . Food insecurity:    Worry: Not on file    Inability:  Not on file  . Transportation needs:    Medical: Not on file    Non-medical: Not on file  Tobacco Use  . Smoking status: Never Smoker  . Smokeless tobacco: Never Used  Substance and Sexual Activity  . Alcohol use: No  . Drug use: No  . Sexual activity: Not on file  Lifestyle  . Physical activity:    Days per week: Not on file    Minutes per session: Not on file  . Stress: Not on file  Relationships  . Social connections:    Talks on phone: Not on file    Gets together: Not on file    Attends religious service: Not on file    Active member of club or organization: Not on file     Attends meetings of clubs or organizations: Not on file    Relationship status: Not on file  . Intimate partner violence:    Fear of current or ex partner: Not on file    Emotionally abused: Not on file    Physically abused: Not on file    Forced sexual activity: Not on file  Other Topics Concern  . Not on file  Social History Narrative  . Not on file      Review of Systems  Constitutional: Negative for chills, fatigue and unexpected weight change.       Weight loss of one pound since her last visit.   HENT: Negative for congestion, postnasal drip, rhinorrhea, sneezing and sore throat.   Respiratory: Negative for cough, chest tightness, shortness of breath and wheezing.   Cardiovascular: Negative for chest pain and palpitations.       Improved blood pressure today.  Gastrointestinal: Negative for abdominal pain, constipation, diarrhea, nausea and vomiting.  Endocrine: Negative for cold intolerance, heat intolerance, polydipsia and polyuria.  Musculoskeletal: Negative for arthralgias, back pain, joint swelling and neck pain.  Skin: Negative for rash.  Allergic/Immunologic: Negative for environmental allergies.  Neurological: Negative for dizziness, tremors, numbness and headaches.  Hematological: Negative for adenopathy. Does not bruise/bleed easily.  Psychiatric/Behavioral: Negative for behavioral problems (Depression), sleep disturbance and suicidal ideas. The patient is not nervous/anxious.     Today's Vitals   02/12/19 1008  BP: (!) 142/88  Pulse: 94  Resp: 16  SpO2: 97%  Weight: (!) 306 lb 3.2 oz (138.9 kg)  Height: 5\' 5"  (1.651 m)   Body mass index is 50.95 kg/m.  Physical Exam Vitals signs and nursing note reviewed.  Constitutional:      General: She is not in acute distress.    Appearance: She is well-developed. She is obese. She is not diaphoretic.  HENT:     Head: Normocephalic and atraumatic.     Mouth/Throat:     Pharynx: No oropharyngeal exudate.   Eyes:     Pupils: Pupils are equal, round, and reactive to light.  Neck:     Musculoskeletal: Normal range of motion and neck supple. Crepitus and pain with movement present.     Thyroid: No thyromegaly.     Vascular: No JVD.     Trachea: No tracheal deviation.  Cardiovascular:     Rate and Rhythm: Normal rate and regular rhythm.     Heart sounds: Normal heart sounds. No murmur. No friction rub. No gallop.   Pulmonary:     Effort: Pulmonary effort is normal. No respiratory distress.     Breath sounds: Normal breath sounds. No wheezing or rales.  Chest:     Chest  wall: No tenderness.  Abdominal:     General: Bowel sounds are normal.     Palpations: Abdomen is soft.  Musculoskeletal: Normal range of motion.  Lymphadenopathy:     Cervical: No cervical adenopathy.  Skin:    General: Skin is warm and dry.  Neurological:     Mental Status: She is alert and oriented to person, place, and time.     Cranial Nerves: No cranial nerve deficit.  Psychiatric:        Behavior: Behavior normal.        Thought Content: Thought content normal.        Judgment: Judgment normal.    Assessment/Plan: 1. Essential (primary) hypertension Improved today. Advised her to continue to take her blood pressure medication as prescribed, every day.   2. Morbid (severe) obesity due to excess calories (HCC) Restart phentermine 37.5mg  tablets every day. Limit calorie intake to 1500 calories per day. Continue to incorporate exercise into daily routine.  - phentermine (ADIPEX-P) 37.5 MG tablet; Take 1 tablet (37.5 mg total) by mouth daily before breakfast.  Dispense: 30 tablet; Refill: 1  General Counseling: Ayza verbalizes understanding of the findings of todays visit and agrees with plan of treatment. I have discussed any further diagnostic evaluation that may be needed or ordered today. We also reviewed her medications today. she has been encouraged to call the office with any questions or concerns that  should arise related to todays visit.    There is a liability release in patients' chart. There has been a 10 minute discussion about the side effects including but not limited to elevated blood pressure, anxiety, lack of sleep and dry mouth. Pt understands and will like to start/continue on appetite suppressant at this time. There will be one month RX given at the time of visit with proper follow up. Nova diet plan with restricted calories is given to the pt. Pt understands and agrees with  plan of treatment  This patient was seen by Leretha Pol FNP Collaboration with Dr Lavera Guise as a part of collaborative care agreement  Meds ordered this encounter  Medications  . phentermine (ADIPEX-P) 37.5 MG tablet    Sig: Take 1 tablet (37.5 mg total) by mouth daily before breakfast.    Dispense:  30 tablet    Refill:  1    Order Specific Question:   Supervising Provider    Answer:   Lavera Guise [[id number]]    Time spent: 14 Minutes      Dr Lavera Guise Internal medicine

## 2019-02-16 ENCOUNTER — Telehealth: Payer: Self-pay

## 2019-03-23 ENCOUNTER — Encounter: Payer: Self-pay | Admitting: Nurse Practitioner

## 2019-03-23 ENCOUNTER — Other Ambulatory Visit: Payer: Self-pay

## 2019-03-23 ENCOUNTER — Ambulatory Visit: Payer: Managed Care, Other (non HMO) | Admitting: Nurse Practitioner

## 2019-03-23 VITALS — Resp 16 | Ht 65.0 in | Wt 303.0 lb

## 2019-03-23 DIAGNOSIS — I10 Essential (primary) hypertension: Secondary | ICD-10-CM

## 2019-03-23 DIAGNOSIS — M542 Cervicalgia: Secondary | ICD-10-CM | POA: Diagnosis not present

## 2019-03-23 MED ORDER — PHENTERMINE HCL 37.5 MG PO TABS: 37.5000 mg | ORAL_TABLET | Freq: Every day | ORAL | 1 refills | Status: DC

## 2019-03-23 MED ORDER — CYCLOBENZAPRINE HCL 10 MG PO TABS: ORAL_TABLET | ORAL | 2 refills | Status: DC

## 2019-03-23 NOTE — Progress Notes (Signed)
[facility] [place], [place] [address]  Internal MEDICINE  Telephone Visit  Patient Name: Stephanie Gates  [id number]  [id number]  Date of Service: 04/07/2019  I connected with the patient at 3:39 by webcam and verified the patients identity using two identifiers.   I discussed the limitations, risks, security and privacy concerns of performing an evaluation and management service by webcam and the availability of in person appointments. I also discussed with the patient that there may be a patient responsible charge related to the service.  The patient expressed understanding and agrees to proceed.    Chief Complaint  Patient presents with  . Telephone Assessment  . Telephone Screen  . Medical Management of Chronic Issues    Weight Management   . Quality Metric Gaps    mammogram    The patient has been contacted via webcam for follow up visit due to concerns for spread of novel coronavirus. The patient is currently taking phentermine for weight management. She has lost 6 pounds since she was last seen. She states that she is doing well with this medication and has no negative side effects. She does have intermittent neck and back pain, mostly after long days at work. She will generally take NSAIDs and muscle relaxer in the evening to reduce pain and help her rest. She would like to have a refill for muscle relaxer.       Current Medication: Outpatient Encounter Medications as of 03/23/2019  Medication Sig Note  . Cyanocobalamin (VITAMIN B-12 PO) Take by mouth.   . cyclobenzaprine (FLEXERIL) 10 MG tablet TAKE ONE TAB PO QHS FOR NECK PAIN PRN   . hydrochlorothiazide (MICROZIDE) 12.5 MG capsule Take 1 capsule (12.5 mg total) by mouth daily. PM   . ibuprofen (ADVIL,MOTRIN) 800 MG tablet Take 1 tablet (800 mg total) by mouth every 8 (eight) hours as needed.   . meclizine (ANTIVERT) 12.5 MG tablet Take 1 tablet (12.5 mg total) by mouth 2 (two) times daily  as needed for dizziness.   . Multiple Vitamin (MULTIVITAMIN) capsule Take 1 capsule by mouth daily.   . pantoprazole (PROTONIX) 40 MG tablet Take 40 mg by mouth daily.  08/30/2016: Received from: External Pharmacy  . phentermine (ADIPEX-P) 37.5 MG tablet Take 1 tablet (37.5 mg total) by mouth daily before breakfast.   . [DISCONTINUED] cyclobenzaprine (FLEXERIL) 10 MG tablet TAKE ONE TAB PO QHS FOR NECK PAIN PRN   . [DISCONTINUED] phentermine (ADIPEX-P) 37.5 MG tablet Take 1 tablet (37.5 mg total) by mouth daily before breakfast.    No facility-administered encounter medications on file as of 03/23/2019.     Surgical History: Past Surgical History:  Procedure Laterality Date  . COLONOSCOPY N/A 05/03/2015   Procedure: COLONOSCOPY;  Surgeon: Lucilla Lame, MD;  Location: [place];  Service: Gastroenterology;  Laterality: N/A;  . ESOPHAGOGASTRODUODENOSCOPY N/A 05/03/2015   Procedure: ESOPHAGOGASTRODUODENOSCOPY (EGD);  Surgeon: Lucilla Lame, MD;  Location: [place];  Service: Gastroenterology;  Laterality: N/A;  . FOOT SURGERY  2014  . Gastric bypass surgery    . POLYPECTOMY  05/03/2015   Procedure: POLYPECTOMY INTESTINAL;  Surgeon: Lucilla Lame, MD;  Location: [place];  Service: Gastroenterology;;    Medical History: Past Medical History:  Diagnosis Date  . Allergy    seasonal  . Anemia   . Arthritis    right hip  . Headache    sinus/allergies  . Hypertension   . Seasonal allergies   . Vertigo  1-2x/month  . Wears contact lenses     Family History: Family History  Problem Relation Age of Onset  . Diabetes Mother   . Hypertension Mother   . Cancer Father   . Diabetes Father   . Hypertension Father     Social History   Socioeconomic History  . Marital status: Single    Spouse name: Not on file  . Number of children: Not on file  . Years of education: Not on file  . Highest education level: Not on file  Occupational History  . Not on file   Social Needs  . Financial resource strain: Not on file  . Food insecurity:    Worry: Not on file    Inability: Not on file  . Transportation needs:    Medical: Not on file    Non-medical: Not on file  Tobacco Use  . Smoking status: Never Smoker  . Smokeless tobacco: Never Used  Substance and Sexual Activity  . Alcohol use: No  . Drug use: No  . Sexual activity: Not on file  Lifestyle  . Physical activity:    Days per week: Not on file    Minutes per session: Not on file  . Stress: Not on file  Relationships  . Social connections:    Talks on phone: Not on file    Gets together: Not on file    Attends religious service: Not on file    Active member of club or organization: Not on file    Attends meetings of clubs or organizations: Not on file    Relationship status: Not on file  . Intimate partner violence:    Fear of current or ex partner: Not on file    Emotionally abused: Not on file    Physically abused: Not on file    Forced sexual activity: Not on file  Other Topics Concern  . Not on file  Social History Narrative  . Not on file      Review of Systems  Constitutional: Negative for chills, fatigue and unexpected weight change.       Weight loss of three pounds since her last visit.   HENT: Negative for congestion, postnasal drip, rhinorrhea, sneezing and sore throat.   Respiratory: Negative for cough, chest tightness, shortness of breath and wheezing.   Cardiovascular: Negative for chest pain and palpitations.       Improved blood pressure today.  Gastrointestinal: Negative for abdominal pain, constipation, diarrhea, nausea and vomiting.  Endocrine: Negative for cold intolerance, heat intolerance, polydipsia and polyuria.  Musculoskeletal: Positive for back pain, myalgias and neck pain. Negative for arthralgias and joint swelling.  Skin: Negative for rash.  Allergic/Immunologic: Negative for environmental allergies.  Neurological: Negative for dizziness,  tremors, numbness and headaches.  Hematological: Negative for adenopathy. Does not bruise/bleed easily.  Psychiatric/Behavioral: Negative for behavioral problems (Depression), sleep disturbance and suicidal ideas. The patient is not nervous/anxious.     Today's Vitals   03/23/19 1513  Resp: 16  Weight: (!) 303 lb (137.4 kg)  Height: 5\' 5"  (1.651 m)   Body mass index is 50.42 kg/m.  Observation/Objective:  The patient is alert and oriented. She is pleasant and answers all questions appropriately. She is in no acute distress.    Assessment/Plan: 1. Neck pain Continue to take OTC NSAIDs as needed and as indicated. May take flexeril 10mg  at bedtime as needed for tight muscles. Apply heat to affected area as needed.  - cyclobenzaprine (FLEXERIL) 10 MG tablet;  TAKE ONE TAB PO QHS FOR NECK PAIN PRN  Dispense: 30 tablet; Refill: 2  2. Morbid (severe) obesity due to excess calories [facility]) May take phentermine 37.5mg  tablets daily. Limit calorie intake to 1500 calories per day and incorporate exercise into daily routine.  - phentermine (ADIPEX-P) 37.5 MG tablet; Take 1 tablet (37.5 mg total) by mouth daily before breakfast.  Dispense: 30 tablet; Refill: 1  3. Essential (primary) hypertension Stable. Continue bp medication as prescribed.   General Counseling: talonda artist understanding of the findings of today's phone visit and agrees with plan of treatment. I have discussed any further diagnostic evaluation that may be needed or ordered today. We also reviewed her medications today. she has been encouraged to call the office with any questions or concerns that should arise related to todays visit.   There is a liability release in patients' chart. There has been a 10 minute discussion about the side effects including but not limited to elevated blood pressure, anxiety, lack of sleep and dry mouth. Pt understands and will like to start/continue on appetite suppressant at this time. There  will be one month RX given at the time of visit with proper follow up. Nova diet plan with restricted calories is given to the pt. Pt understands and agrees with  plan of treatment  This patient was seen by Leretha Pol FNP Collaboration with Dr Lavera Guise as a part of collaborative care agreement  Meds ordered this encounter  Medications  . phentermine (ADIPEX-P) 37.5 MG tablet    Sig: Take 1 tablet (37.5 mg total) by mouth daily before breakfast.    Dispense:  30 tablet    Refill:  1    Order Specific Question:   Supervising Provider    Answer:   Lavera Guise [place]  . cyclobenzaprine (FLEXERIL) 10 MG tablet    Sig: TAKE ONE TAB PO QHS FOR NECK PAIN PRN    Dispense:  30 tablet    Refill:  2    Order Specific Question:   Supervising Provider    Answer:   Lavera Guise [[id number]]    Time spent: 60 Minutes    Dr Lavera Guise Internal medicine

## 2019-03-25 NOTE — Telephone Encounter (Signed)
done

## 2019-05-04 ENCOUNTER — Ambulatory Visit: Payer: Self-pay | Admitting: Nurse Practitioner

## 2019-07-15 ENCOUNTER — Other Ambulatory Visit: Payer: Self-pay

## 2019-07-15 ENCOUNTER — Ambulatory Visit: Payer: Managed Care, Other (non HMO) | Admitting: Adult Health

## 2019-07-15 ENCOUNTER — Encounter: Payer: Self-pay | Admitting: Adult Health

## 2019-07-15 VITALS — BP 149/92 | HR 87 | Resp 16 | Ht 65.0 in | Wt 312.0 lb

## 2019-07-15 DIAGNOSIS — Z0000 Encounter for general adult medical examination without abnormal findings: Secondary | ICD-10-CM

## 2019-07-15 DIAGNOSIS — Z1239 Encounter for other screening for malignant neoplasm of breast: Secondary | ICD-10-CM

## 2019-07-15 NOTE — Progress Notes (Signed)
[facility] [place], [place] [address]  Internal MEDICINE  Office Visit Note  Patient Name: Stephanie Gates  [id number]  [id number]  Date of Service: 07/15/2019  Chief Complaint  Patient presents with  . Medical Management of Chronic Issues    bio matrix screen ,   . Quality Metric Gaps    physical and mammogram, labs     HPI  PT is here for biometric screening for [employer].  She needs labs to complete the screening.  She is also in need of a physical and mammogram to close her quality metric gaps.     Current Medication: Outpatient Encounter Medications as of 07/15/2019  Medication Sig Note  . Cyanocobalamin (VITAMIN B-12 PO) Take by mouth.   . cyclobenzaprine (FLEXERIL) 10 MG tablet TAKE ONE TAB PO QHS FOR NECK PAIN PRN   . hydrochlorothiazide (MICROZIDE) 12.5 MG capsule Take 1 capsule (12.5 mg total) by mouth daily. PM   . ibuprofen (ADVIL,MOTRIN) 800 MG tablet Take 1 tablet (800 mg total) by mouth every 8 (eight) hours as needed.   . meclizine (ANTIVERT) 12.5 MG tablet Take 1 tablet (12.5 mg total) by mouth 2 (two) times daily as needed for dizziness.   . Multiple Vitamin (MULTIVITAMIN) capsule Take 1 capsule by mouth daily.   . pantoprazole (PROTONIX) 40 MG tablet Take 40 mg by mouth daily.  08/30/2016: Received from: External Pharmacy  . phentermine (ADIPEX-P) 37.5 MG tablet Take 1 tablet (37.5 mg total) by mouth daily before breakfast. (Patient not taking: Reported on 07/15/2019)    No facility-administered encounter medications on file as of 07/15/2019.     Surgical History: Past Surgical History:  Procedure Laterality Date  . COLONOSCOPY N/A 05/03/2015   Procedure: COLONOSCOPY;  Surgeon: Lucilla Lame, MD;  Location: [place];  Service: Gastroenterology;  Laterality: N/A;  . ESOPHAGOGASTRODUODENOSCOPY N/A 05/03/2015   Procedure: ESOPHAGOGASTRODUODENOSCOPY (EGD);  Surgeon: Lucilla Lame, MD;  Location: [place];  Service:  Gastroenterology;  Laterality: N/A;  . FOOT SURGERY  2014  . Gastric bypass surgery    . POLYPECTOMY  05/03/2015   Procedure: POLYPECTOMY INTESTINAL;  Surgeon: Lucilla Lame, MD;  Location: [place];  Service: Gastroenterology;;    Medical History: Past Medical History:  Diagnosis Date  . Allergy    seasonal  . Anemia   . Arthritis    right hip  . Headache    sinus/allergies  . Hypertension   . Seasonal allergies   . Vertigo    1-2x/month  . Wears contact lenses     Family History: Family History  Problem Relation Age of Onset  . Diabetes Mother   . Hypertension Mother   . Cancer Father   . Diabetes Father   . Hypertension Father     Social History   Socioeconomic History  . Marital status: Single    Spouse name: Not on file  . Number of children: Not on file  . Years of education: Not on file  . Highest education level: Not on file  Occupational History  . Not on file  Social Needs  . Financial resource strain: Not on file  . Food insecurity    Worry: Not on file    Inability: Not on file  . Transportation needs    Medical: Not on file    Non-medical: Not on file  Tobacco Use  . Smoking status: Never Smoker  . Smokeless tobacco: Never Used  Substance and Sexual Activity  .  Alcohol use: No  . Drug use: No  . Sexual activity: Not on file  Lifestyle  . Physical activity    Days per week: Not on file    Minutes per session: Not on file  . Stress: Not on file  Relationships  . Social Herbalist on phone: Not on file    Gets together: Not on file    Attends religious service: Not on file    Active member of club or organization: Not on file    Attends meetings of clubs or organizations: Not on file    Relationship status: Not on file  . Intimate partner violence    Fear of current or ex partner: Not on file    Emotionally abused: Not on file    Physically abused: Not on file    Forced sexual activity: Not on file  Other Topics  Concern  . Not on file  Social History Narrative  . Not on file      Review of Systems  Constitutional: Negative for chills, fatigue and unexpected weight change.  HENT: Negative for congestion, rhinorrhea, sneezing and sore throat.   Eyes: Negative for photophobia, pain and redness.  Respiratory: Negative for cough, chest tightness and shortness of breath.   Cardiovascular: Negative for chest pain and palpitations.  Gastrointestinal: Negative for abdominal pain, constipation, diarrhea, nausea and vomiting.  Endocrine: Negative.   Genitourinary: Negative for dysuria and frequency.  Musculoskeletal: Negative for arthralgias, back pain, joint swelling and neck pain.  Skin: Negative for rash.  Allergic/Immunologic: Negative.   Neurological: Negative for tremors and numbness.  Hematological: Negative for adenopathy. Does not bruise/bleed easily.  Psychiatric/Behavioral: Negative for behavioral problems and sleep disturbance. The patient is not nervous/anxious.     Vital Signs: BP (!) 149/92   Pulse 87   Resp 16   Ht 5\' 5"  (1.651 m)   Wt (!) 312 lb (141.5 kg)   SpO2 98%   BMI 51.92 kg/m    Physical Exam Vitals signs and nursing note reviewed.  Constitutional:      General: She is not in acute distress.    Appearance: She is well-developed. She is not diaphoretic.  HENT:     Head: Normocephalic and atraumatic.     Mouth/Throat:     Pharynx: No oropharyngeal exudate.  Eyes:     Pupils: Pupils are equal, round, and reactive to light.  Neck:     Musculoskeletal: Normal range of motion and neck supple.     Thyroid: No thyromegaly.     Vascular: No JVD.     Trachea: No tracheal deviation.  Cardiovascular:     Rate and Rhythm: Normal rate and regular rhythm.     Heart sounds: Normal heart sounds. No murmur. No friction rub. No gallop.   Pulmonary:     Effort: Pulmonary effort is normal. No respiratory distress.     Breath sounds: Normal breath sounds. No wheezing or  rales.  Chest:     Chest wall: No tenderness.  Abdominal:     Palpations: Abdomen is soft.     Tenderness: There is no abdominal tenderness. There is no guarding.  Musculoskeletal: Normal range of motion.  Lymphadenopathy:     Cervical: No cervical adenopathy.  Skin:    General: Skin is warm and dry.  Neurological:     Mental Status: She is alert and oriented to person, place, and time.     Cranial Nerves: No cranial nerve deficit.  Psychiatric:        Behavior: Behavior normal.        Thought Content: Thought content normal.        Judgment: Judgment normal.     Assessment/Plan: 1. Wellness examination Labs ordered for physical, as well as covid antibody test at patient request. - SARS-CoV-2 Antibodies - CBC with Differential/Platelet - Lipid Panel With LDL/HDL Ratio - TSH - T4, free - Comprehensive metabolic panel - HgB I7P  2. Screening for breast cancer Will get screening mammogram. - MM DIGITAL SCREENING BILATERAL; Future  General Counseling: anthonette lesage understanding of the findings of todays visit and agrees with plan of treatment. I have discussed any further diagnostic evaluation that may be needed or ordered today. We also reviewed her medications today. she has been encouraged to call the office with any questions or concerns that should arise related to todays visit.    Orders Placed This Encounter  Procedures  . MM DIGITAL SCREENING BILATERAL  . SARS-CoV-2 Antibodies  . CBC with Differential/Platelet  . Lipid Panel With LDL/HDL Ratio  . TSH  . T4, free  . Comprehensive metabolic panel  . HgB A1c    No orders of the defined types were placed in this encounter.   Time spent: 15  Minutes   This patient was seen by Orson Gear AGNP-C in Collaboration with Dr Lavera Guise as a part of collaborative care agreement     Kendell Bane AGNP-C Internal medicine

## 2019-07-22 ENCOUNTER — Ambulatory Visit: Payer: Managed Care, Other (non HMO) | Admitting: Adult Health

## 2019-08-17 ENCOUNTER — Other Ambulatory Visit: Payer: Self-pay | Admitting: Nurse Practitioner

## 2019-08-17 ENCOUNTER — Other Ambulatory Visit: Payer: Self-pay | Admitting: Adult Health

## 2019-08-17 ENCOUNTER — Other Ambulatory Visit: Payer: Self-pay | Admitting: Endocrinology

## 2019-08-17 DIAGNOSIS — Z1231 Encounter for screening mammogram for malignant neoplasm of breast: Secondary | ICD-10-CM

## 2019-08-26 ENCOUNTER — Other Ambulatory Visit: Payer: Self-pay

## 2019-08-26 MED ORDER — HYDROCHLOROTHIAZIDE 12.5 MG PO CAPS: 12.5000 mg | ORAL_CAPSULE | Freq: Every day | ORAL | 3 refills | Status: DC

## 2019-10-04 ENCOUNTER — Ambulatory Visit
Admission: RE | Admit: 2019-10-04 | Discharge: 2019-10-04 | Disposition: A | Discharge status: Home / Self Care | Payer: Managed Care, Other (non HMO) | Source: Ambulatory Visit | Attending: Nurse Practitioner | Admitting: Nurse Practitioner

## 2019-10-04 DIAGNOSIS — Z1231 Encounter for screening mammogram for malignant neoplasm of breast: Secondary | ICD-10-CM | POA: Diagnosis present

## 2019-10-10 NOTE — Progress Notes (Signed)
Negative mammogram

## 2019-10-11 ENCOUNTER — Other Ambulatory Visit: Payer: Self-pay

## 2019-10-11 ENCOUNTER — Encounter: Payer: Self-pay | Admitting: Internal Medicine

## 2019-10-11 ENCOUNTER — Ambulatory Visit (INDEPENDENT_AMBULATORY_CARE_PROVIDER_SITE_OTHER): Payer: Managed Care, Other (non HMO) | Admitting: Internal Medicine

## 2019-10-11 DIAGNOSIS — M5412 Radiculopathy, cervical region: Secondary | ICD-10-CM

## 2019-10-11 DIAGNOSIS — Z9884 Bariatric surgery status: Secondary | ICD-10-CM | POA: Diagnosis not present

## 2019-10-11 DIAGNOSIS — Z23 Encounter for immunization: Secondary | ICD-10-CM | POA: Diagnosis not present

## 2019-10-11 DIAGNOSIS — I10 Essential (primary) hypertension: Secondary | ICD-10-CM

## 2019-10-11 DIAGNOSIS — Z0001 Encounter for general adult medical examination with abnormal findings: Secondary | ICD-10-CM | POA: Diagnosis not present

## 2019-10-11 DIAGNOSIS — R300 Dysuria: Secondary | ICD-10-CM

## 2019-10-11 NOTE — Progress Notes (Signed)
[facility] [place], [place] [address]  Internal MEDICINE  Office Visit Note  Patient Name: Stephanie Gates  [id number]  [id number]  Date of Service: 10/16/2019  Chief Complaint  Patient presents with  . Annual Exam    pt stopped taking weight loss medication, but is wanting to start back   . Hypertension  . Neck Pain    at night arms start going numb due to neck pain and is having a hard time sleeping because of this, patient has been seen by [facility]   HPI Pt is here for routine health maintenance examination. She is C/O 1. Neck pain with radiation to both arms and numbness, she thinks she feels weakness in her arms 2. 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 Pt also had Colonoscopy/EGD  in 2016 due to iron deficiency anemia, found to have one polyp in ascending colon 4. Will need mammogram 5. Does not want to have BMD  6. She is very interesting in losing weight.  Current Medication: Outpatient Encounter Medications as of 10/11/2019  Medication Sig Note  . Cyanocobalamin (VITAMIN B-12 PO) Take by mouth.   . cyclobenzaprine (FLEXERIL) 10 MG tablet TAKE ONE TAB PO QHS FOR NECK PAIN PRN   . hydrochlorothiazide (MICROZIDE) 12.5 MG capsule Take 1 capsule (12.5 mg total) by mouth daily. PM   . ibuprofen (ADVIL,MOTRIN) 800 MG tablet Take 1 tablet (800 mg total) by mouth every 8 (eight) hours as needed.   . meclizine (ANTIVERT) 12.5 MG tablet Take 1 tablet (12.5 mg total) by mouth 2 (two) times daily as needed for dizziness.   . Multiple Vitamin (MULTIVITAMIN) capsule Take 1 capsule by mouth daily.   . pantoprazole (PROTONIX) 40 MG tablet Take 40 mg by mouth daily.  08/30/2016: Received from: External Pharmacy  . [DISCONTINUED] cyclobenzaprine (FLEXERIL) 10 MG tablet TAKE ONE TAB PO QHS FOR NECK PAIN PRN   . [DISCONTINUED] hydrochlorothiazide (MICROZIDE) 12.5 MG capsule Take 1  capsule (12.5 mg total) by mouth daily. PM   . [DISCONTINUED] phentermine (ADIPEX-P) 37.5 MG tablet Take 1 tablet (37.5 mg total) by mouth daily before breakfast.    No facility-administered encounter medications on file as of 10/11/2019.     Surgical History: Past Surgical History:  Procedure Laterality Date  . COLONOSCOPY N/A 05/03/2015   Procedure: COLONOSCOPY;  Surgeon: Lucilla Lame, MD;  Location: [place];  Service: Gastroenterology;  Laterality: N/A;  . ESOPHAGOGASTRODUODENOSCOPY N/A 05/03/2015   Procedure: ESOPHAGOGASTRODUODENOSCOPY (EGD);  Surgeon: Lucilla Lame, MD;  Location: [place];  Service: Gastroenterology;  Laterality: N/A;  . FOOT SURGERY  2014  . Gastric bypass surgery    . POLYPECTOMY  05/03/2015   Procedure: POLYPECTOMY INTESTINAL;  Surgeon: Lucilla Lame, MD;  Location: [place];  Service: Gastroenterology;;    Medical History: Past Medical History:  Diagnosis Date  . Allergy    seasonal  . Anemia   . Arthritis    right hip  . Headache    sinus/allergies  . Hypertension   . Seasonal allergies   . Vertigo    1-2x/month  . Wears contact lenses     Family History: Family History  Problem Relation Age of Onset  . Diabetes Mother   . Hypertension Mother   . Cancer Father   . Diabetes Father   . Hypertension Father     Review of Systems  Constitutional: Positive for  unexpected weight change. Negative for chills, diaphoresis and fatigue.       Weight gain   HENT: Negative for ear pain, postnasal drip and sinus pressure.   Eyes: Negative for photophobia, discharge, redness, itching and visual disturbance.  Respiratory: Negative for cough, shortness of breath and wheezing.   Cardiovascular: Negative for chest pain, palpitations and leg swelling.  Gastrointestinal: Negative for abdominal pain, constipation, diarrhea, nausea and vomiting.  Genitourinary: Negative for dysuria and flank pain.  Musculoskeletal: Positive for neck  pain and neck stiffness. Negative for arthralgias, back pain and gait problem.  Skin: Negative for color change.  Allergic/Immunologic: Negative for environmental allergies and food allergies.  Neurological: Positive for weakness and numbness. Negative for dizziness and headaches.  Hematological: Does not bruise/bleed easily.  Psychiatric/Behavioral: Negative for agitation, behavioral problems (depression) and hallucinations.   Vital Signs: BP 130/82   Pulse 76   Temp (!) 97.1 F (36.2 C)   Resp 16   Ht 5\' 5"  (1.651 m)   Wt (!) 315 lb (142.9 kg)   SpO2 99%   BMI 52.42 kg/m    Physical Exam Constitutional:      General: She is not in acute distress.    Appearance: She is well-developed. She is not diaphoretic.  HENT:     Head: Normocephalic and atraumatic.     Mouth/Throat:     Pharynx: No oropharyngeal exudate.  Eyes:     Pupils: Pupils are equal, round, and reactive to light.  Neck:     Musculoskeletal: Neck rigidity present.     Thyroid: No thyromegaly.     Vascular: No JVD.     Trachea: No tracheal deviation.     Comments: Painful range of motion, motor 4/5 bilateral  Cardiovascular:     Rate and Rhythm: Normal rate and regular rhythm.     Heart sounds: Normal heart sounds. No murmur. No friction rub. No gallop.   Pulmonary:     Effort: Pulmonary effort is normal. No respiratory distress.     Breath sounds: No wheezing or rales.  Chest:     Chest wall: No tenderness.  Abdominal:     General: Bowel sounds are normal.     Palpations: Abdomen is soft.  Musculoskeletal: Normal range of motion.  Lymphadenopathy:     Cervical: No cervical adenopathy.  Skin:    General: Skin is warm and dry.  Neurological:     Mental Status: She is alert and oriented to person, place, and time.     Cranial Nerves: No cranial nerve deficit.  Psychiatric:        Behavior: Behavior normal.        Thought Content: Thought content normal.        Judgment: Judgment normal.     LABS: Recent Results (from the past 2160 hour(s))  UA/M w/rflx Culture, Routine     Status: None   Collection Time: 10/11/19  4:08 PM   Specimen: Urine   URINE  Result Value Ref Range   Specific Gravity, UA 1.026 1.005 - 1.030   pH, UA 5.0 5.0 - 7.5   Color, UA Yellow Yellow   Appearance Ur Clear Clear   Leukocytes,UA Negative Negative   Protein,UA Negative Negative/Trace   Glucose, UA Negative Negative   Ketones, UA Negative Negative   RBC, UA Negative Negative   Bilirubin, UA Negative Negative   Urobilinogen, Ur 1.0 0.2 - 1.0 mg/dL   Nitrite, UA Negative Negative   Microscopic Examination Comment  Comment: Microscopic follows if indicated.   Microscopic Examination See below:     Comment: Microscopic was indicated and was performed.   Urinalysis Reflex Comment     Comment: This specimen will not reflex to a Urine Culture.  Microscopic Examination     Status: None   Collection Time: 10/11/19  4:08 PM   URINE  Result Value Ref Range   WBC, UA 0-5 0 - 5 /hpf   RBC 0-2 0 - 2 /hpf   Epithelial Cells (non renal) None seen 0 - 10 /hpf   Casts None seen None seen /lpf   Mucus, UA Present Not Estab.   Bacteria, UA Few None seen/Few  CBC with Differential/Platelet     Status: Abnormal   Collection Time: 10/13/19  7:23 AM  Result Value Ref Range   WBC 10.6 3.4 - 10.8 x10E3/uL   RBC 4.72 3.77 - 5.28 x10E6/uL   Hemoglobin 12.1 11.1 - 15.9 g/dL   Hematocrit 37.3 34.0 - 46.6 %   MCV 79 79 - 97 fL   MCH 25.6 (L) 26.6 - 33.0 pg   MCHC 32.4 31.5 - 35.7 g/dL   RDW 14.6 11.7 - 15.4 %   Platelets 315 150 - 450 x10E3/uL   Neutrophils 54 Not Estab. %   Lymphs 37 Not Estab. %   Monocytes 8 Not Estab. %   Eos 1 Not Estab. %   Basos 0 Not Estab. %   Neutrophils Absolute 5.6 1.4 - 7.0 x10E3/uL   Lymphocytes Absolute 3.9 (H) 0.7 - 3.1 x10E3/uL   Monocytes Absolute 0.8 0.1 - 0.9 x10E3/uL   EOS (ABSOLUTE) 0.1 0.0 - 0.4 x10E3/uL   Basophils Absolute 0.0 0.0 - 0.2 x10E3/uL    Immature Granulocytes 0 Not Estab. %   Immature Grans (Abs) 0.0 0.0 - 0.1 x10E3/uL  Lipid Panel With LDL/HDL Ratio     Status: None   Collection Time: 10/13/19  7:23 AM  Result Value Ref Range   Cholesterol, Total 146 100 - 199 mg/dL   Triglycerides 86 0 - 149 mg/dL   HDL 56 >39 mg/dL   VLDL Cholesterol Cal 16 5 - 40 mg/dL   LDL Chol Calc (NIH) 74 0 - 99 mg/dL   LDL/HDL Ratio 1.3 0.0 - 3.2 ratio    Comment:                                     LDL/HDL Ratio                                             Men  Women                               1/2 Avg.Risk  1.0    1.5                                   Avg.Risk  3.6    3.2                                2X Avg.Risk  6.2    5.0  3X Avg.Risk  8.0    6.1   TSH     Status: None   Collection Time: 10/13/19  7:23 AM  Result Value Ref Range   TSH 2.980 0.450 - 4.500 uIU/mL  T4, free     Status: None   Collection Time: 10/13/19  7:23 AM  Result Value Ref Range   Free T4 1.22 0.82 - 1.77 ng/dL  Comprehensive metabolic panel     Status: Abnormal   Collection Time: 10/13/19  7:23 AM  Result Value Ref Range   Glucose 91 65 - 99 mg/dL   BUN 13 6 - 24 mg/dL   Creatinine, Ser 0.88 0.57 - 1.00 mg/dL   GFR calc non Af Amer 75 >59 mL/min/1.73   GFR calc Af Amer 87 >59 mL/min/1.73   BUN/Creatinine Ratio 15 9 - 23   Sodium 144 134 - 144 mmol/L   Potassium 3.8 3.5 - 5.2 mmol/L   Chloride 107 (H) 96 - 106 mmol/L   CO2 23 20 - 29 mmol/L   Calcium 9.4 8.7 - 10.2 mg/dL   Total Protein 7.0 6.0 - 8.5 g/dL   Albumin 3.9 3.8 - 4.9 g/dL   Globulin, Total 3.1 1.5 - 4.5 g/dL   Albumin/Globulin Ratio 1.3 1.2 - 2.2   Bilirubin Total 0.5 0.0 - 1.2 mg/dL   Alkaline Phosphatase 121 (H) 39 - 117 IU/L   AST 22 0 - 40 IU/L   ALT 25 0 - 32 IU/L  B12 and Folate Panel     Status: None   Collection Time: 10/13/19  7:23 AM  Result Value Ref Range   Vitamin B-12 844 232 - 1,245 pg/mL   Folate 12.3 >3.0 ng/mL    Comment: A serum folate  concentration of less than 3.1 ng/mL is considered to represent clinical deficiency.    Assessment/Plan: 1. Encounter for general adult medical examination with abnormal findings - Update all labs - CBC with Differential/Platelet  2. Benign hypertension - Continue hctz as before  - Comprehensive metabolic panel  3. Morbid (severe) obesity due to excess calories (HCC) - Restrict calories, avoid app suppressant if pt is not following a restricted caroline diet  - Lipid Panel With LDL/HDL Ratio - TSH - T4, free  4. Cervical radiculopathy - Worsening symptoms, might need MRI C spine  - DG Cervical Spine Complete; Future  5. Personal history of gastric bypass - Will monitor nutritional deficiencies, might need a revision  - B12 and Folate Panel - Ferritin; Future  6. Flu vaccine need - Flu Vaccine MDCK QUAD PF  7. Dysuria - UA/M w/rflx Culture, Routine - Microscopic Examination   General Counseling: Jocilynn verbalizes understanding of the findings of todays visit and agrees with plan of treatment. I have discussed any further diagnostic evaluation that may be needed or ordered today. We also reviewed her medications today. she has been encouraged to call the office with any questions or concerns that should arise related to todays visit.  Counseling: Obesity Counseling: Risk Assessment: An assessment of behavioral risk factors was made today and includes lack of exercise sedentary lifestyle, lack of portion control and poor dietary habits.  Risk Modification Advice: She was counseled on portion control guidelines. Restricting daily caloric intake to. . The detrimental long term effects of obesity on her health and ongoing poor compliance was also discussed with the patient.  Orders Placed This Encounter  Procedures  . Microscopic Examination  . DG Cervical Spine Complete  . Flu Vaccine MDCK  QUAD PF  . UA/M w/rflx Culture, Routine  . CBC with Differential/Platelet  . Lipid  Panel With LDL/HDL Ratio  . TSH  . T4, free  . Comprehensive metabolic panel  . B12 and Folate Panel  . Ferritin     Time spent:25 [place], MD  Internal Medicine

## 2019-10-12 ENCOUNTER — Ambulatory Visit
Admission: RE | Admit: 2019-10-12 | Discharge: 2019-10-12 | Disposition: A | Discharge status: Home / Self Care | Payer: Managed Care, Other (non HMO) | Source: Ambulatory Visit | Attending: Internal Medicine | Admitting: Internal Medicine

## 2019-10-12 ENCOUNTER — Other Ambulatory Visit: Payer: Self-pay

## 2019-10-12 DIAGNOSIS — R300 Dysuria: Secondary | ICD-10-CM | POA: Insufficient documentation

## 2019-10-12 DIAGNOSIS — Z0001 Encounter for general adult medical examination with abnormal findings: Secondary | ICD-10-CM

## 2019-10-12 DIAGNOSIS — Z23 Encounter for immunization: Secondary | ICD-10-CM | POA: Diagnosis not present

## 2019-10-12 LAB — UA/M W/RFLX CULTURE, ROUTINE
Bilirubin, UA: NEGATIVE
Glucose, UA: NEGATIVE
Ketones, UA: NEGATIVE
Leukocytes,UA: NEGATIVE
Nitrite, UA: NEGATIVE
Protein,UA: NEGATIVE
RBC, UA: NEGATIVE
Specific Gravity, UA: 1.026 (ref 1.005–1.030)
Urobilinogen, Ur: 1 mg/dL (ref 0.2–1.0)
pH, UA: 5 (ref 5.0–7.5)

## 2019-10-12 LAB — MICROSCOPIC EXAMINATION
Casts: NONE SEEN /lpf
Epithelial Cells (non renal): NONE SEEN /hpf (ref 0–10)

## 2019-10-14 ENCOUNTER — Other Ambulatory Visit: Payer: Self-pay

## 2019-10-14 ENCOUNTER — Telehealth: Payer: Self-pay

## 2019-10-14 LAB — COMPREHENSIVE METABOLIC PANEL
ALT: 25 IU/L (ref 0–32)
AST: 22 IU/L (ref 0–40)
Albumin/Globulin Ratio: 1.3 (ref 1.2–2.2)
Albumin: 3.9 g/dL (ref 3.8–4.9)
Alkaline Phosphatase: 121 IU/L — ABNORMAL HIGH (ref 39–117)
BUN/Creatinine Ratio: 15 (ref 9–23)
BUN: 13 mg/dL (ref 6–24)
Bilirubin Total: 0.5 mg/dL (ref 0.0–1.2)
CO2: 23 mmol/L (ref 20–29)
Calcium: 9.4 mg/dL (ref 8.7–10.2)
Chloride: 107 mmol/L — ABNORMAL HIGH (ref 96–106)
Creatinine, Ser: 0.88 mg/dL (ref 0.57–1.00)
GFR calc Af Amer: 87 mL/min/{1.73_m2} (ref >59)
GFR calc non Af Amer: 75 mL/min/{1.73_m2} (ref >59)
Globulin, Total: 3.1 g/dL (ref 1.5–4.5)
Glucose: 91 mg/dL (ref 65–99)
Potassium: 3.8 mmol/L (ref 3.5–5.2)
Sodium: 144 mmol/L (ref 134–144)
Total Protein: 7 g/dL (ref 6.0–8.5)

## 2019-10-14 LAB — CBC WITH DIFFERENTIAL/PLATELET
Basophils Absolute: 0 10*3/uL (ref 0.0–0.2)
Basos: 0 %
EOS (ABSOLUTE): 0.1 10*3/uL (ref 0.0–0.4)
Eos: 1 %
Hematocrit: 37.3 % (ref 34.0–46.6)
Hemoglobin: 12.1 g/dL (ref 11.1–15.9)
Immature Grans (Abs): 0 10*3/uL (ref 0.0–0.1)
Immature Granulocytes: 0 %
Lymphocytes Absolute: 3.9 10*3/uL — ABNORMAL HIGH (ref 0.7–3.1)
Lymphs: 37 %
MCH: 25.6 pg — ABNORMAL LOW (ref 26.6–33.0)
MCHC: 32.4 g/dL (ref 31.5–35.7)
MCV: 79 fL (ref 79–97)
Monocytes Absolute: 0.8 10*3/uL (ref 0.1–0.9)
Monocytes: 8 %
Neutrophils Absolute: 5.6 10*3/uL (ref 1.4–7.0)
Neutrophils: 54 %
Platelets: 315 10*3/uL (ref 150–450)
RBC: 4.72 x10E6/uL (ref 3.77–5.28)
RDW: 14.6 % (ref 11.7–15.4)
WBC: 10.6 10*3/uL (ref 3.4–10.8)

## 2019-10-14 LAB — B12 AND FOLATE PANEL
Folate: 12.3 ng/mL (ref >3.0)
Vitamin B-12: 844 pg/mL (ref 232–1245)

## 2019-10-14 LAB — TSH: TSH: 2.98 u[IU]/mL (ref 0.450–4.500)

## 2019-10-14 LAB — LIPID PANEL WITH LDL/HDL RATIO
Cholesterol, Total: 146 mg/dL (ref 100–199)
HDL: 56 mg/dL (ref >39)
LDL Chol Calc (NIH): 74 mg/dL (ref 0–99)
LDL/HDL Ratio: 1.3 ratio (ref 0.0–3.2)
Triglycerides: 86 mg/dL (ref 0–149)
VLDL Cholesterol Cal: 16 mg/dL (ref 5–40)

## 2019-10-14 LAB — T4, FREE: Free T4: 1.22 ng/dL (ref 0.82–1.77)

## 2019-10-14 MED ORDER — CELECOXIB 200 MG PO CAPS: 200.0000 mg | ORAL_CAPSULE | Freq: Every day | ORAL | 1 refills | Status: DC

## 2019-10-14 NOTE — Telephone Encounter (Signed)
Advised pt of arthritis and sent in celebrex.

## 2019-10-14 NOTE — Telephone Encounter (Signed)
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 with one refill, continue Flexeril 10 mg po qhs# 30 prn  ( she is already on it) if no improvement, will order MRI of C-spine on next visit

## 2019-10-25 ENCOUNTER — Other Ambulatory Visit: Payer: Self-pay | Admitting: Internal Medicine

## 2019-10-25 MED ORDER — CELECOXIB 200 MG PO CAPS: 200.0000 mg | ORAL_CAPSULE | Freq: Every day | ORAL | 1 refills | Status: DC

## 2019-11-18 ENCOUNTER — Telehealth: Payer: Self-pay

## 2019-11-18 NOTE — Telephone Encounter (Signed)
Called lmom informing patient of appointment. klh 

## 2019-11-22 ENCOUNTER — Ambulatory Visit: Payer: Managed Care, Other (non HMO) | Admitting: Adult Health

## 2019-11-23 ENCOUNTER — Telehealth: Payer: Self-pay

## 2019-11-23 NOTE — Telephone Encounter (Signed)
Called lmom informing patient of appointment. klh 

## 2019-11-25 ENCOUNTER — Ambulatory Visit: Payer: Managed Care, Other (non HMO) | Admitting: Adult Health

## 2019-11-25 ENCOUNTER — Other Ambulatory Visit: Payer: Self-pay | Admitting: Adult Health

## 2019-11-25 ENCOUNTER — Other Ambulatory Visit: Payer: Self-pay

## 2019-11-25 ENCOUNTER — Encounter: Payer: Self-pay | Admitting: Adult Health

## 2019-11-25 DIAGNOSIS — K219 Gastro-esophageal reflux disease without esophagitis: Secondary | ICD-10-CM

## 2019-11-25 DIAGNOSIS — I10 Essential (primary) hypertension: Secondary | ICD-10-CM

## 2019-11-25 DIAGNOSIS — M5412 Radiculopathy, cervical region: Secondary | ICD-10-CM

## 2019-11-25 MED ORDER — PANTOPRAZOLE SODIUM 40 MG PO TBEC: 40.0000 mg | DELAYED_RELEASE_TABLET | Freq: Every day | ORAL | 1 refills | Status: DC

## 2019-11-25 NOTE — Progress Notes (Signed)
[facility] [place], [place] [address]  Internal MEDICINE  Telephone Visit  Patient Name: Stephanie Gates  [id number]  [id number]  Date of Service: 11/25/2019  I connected with the patient at 439 by telephone and verified the patients identity using two identifiers.   I discussed the limitations, risks, security and privacy concerns of performing an evaluation and management service by telephone and the availability of in person appointments. I also discussed with the patient that there may be a patient responsible charge related to the service.  The patient expressed understanding and agrees to proceed.    Chief Complaint  Patient presents with  . Telephone Screen  . Telephone Assessment  . Hypertension  . Abdominal Pain    HPI  Pt is seen via telephone. She initially reports abdominal pain.  She describes epigastric soreness or discomfort.  She reports it sometimes feels like burning.  She reports her bp has been well controlled. Denies Chest pain, Shortness of breath, palpitations, headache, or blurred vision.  She has a history of some cervical radiculopathy, that is improving with celebrex.    Current Medication: Outpatient Encounter Medications as of 11/25/2019  Medication Sig Note  . celecoxib (CELEBREX) 200 MG capsule Take 1 capsule (200 mg total) by mouth daily.   . Cyanocobalamin (VITAMIN B-12 PO) Take by mouth.   . cyclobenzaprine (FLEXERIL) 10 MG tablet TAKE ONE TAB PO QHS FOR NECK PAIN PRN   . hydrochlorothiazide (MICROZIDE) 12.5 MG capsule Take 1 capsule (12.5 mg total) by mouth daily. PM   . ibuprofen (ADVIL,MOTRIN) 800 MG tablet Take 1 tablet (800 mg total) by mouth every 8 (eight) hours as needed.   . meclizine (ANTIVERT) 12.5 MG tablet Take 1 tablet (12.5 mg total) by mouth 2 (two) times daily as needed for dizziness.   . Multiple Vitamin (MULTIVITAMIN) capsule Take 1 capsule by mouth daily.   . pantoprazole (PROTONIX) 40 MG tablet  Take 1 tablet (40 mg total) by mouth daily.   . [DISCONTINUED] pantoprazole (PROTONIX) 40 MG tablet Take 40 mg by mouth daily.  08/30/2016: Received from: External Pharmacy   No facility-administered encounter medications on file as of 11/25/2019.    Surgical History: Past Surgical History:  Procedure Laterality Date  . COLONOSCOPY N/A 05/03/2015   Procedure: COLONOSCOPY;  Surgeon: Lucilla Lame, MD;  Location: [place];  Service: Gastroenterology;  Laterality: N/A;  . ESOPHAGOGASTRODUODENOSCOPY N/A 05/03/2015   Procedure: ESOPHAGOGASTRODUODENOSCOPY (EGD);  Surgeon: Lucilla Lame, MD;  Location: [place];  Service: Gastroenterology;  Laterality: N/A;  . FOOT SURGERY  2014  . Gastric bypass surgery    . POLYPECTOMY  05/03/2015   Procedure: POLYPECTOMY INTESTINAL;  Surgeon: Lucilla Lame, MD;  Location: [place];  Service: Gastroenterology;;    Medical History: Past Medical History:  Diagnosis Date  . Allergy    seasonal  . Anemia   . Arthritis    right hip  . Headache    sinus/allergies  . Hypertension   . Seasonal allergies   . Vertigo    1-2x/month  . Wears contact lenses     Family History: Family History  Problem Relation Age of Onset  . Diabetes Mother   . Hypertension Mother   . Cancer Father   . Diabetes Father   . Hypertension Father     Social History   Socioeconomic History  . Marital status: Single    Spouse name: Not on file  . Number of children: Not  on file  . Years of education: Not on file  . Highest education level: Not on file  Occupational History  . Not on file  Tobacco Use  . Smoking status: Never Smoker  . Smokeless tobacco: Never Used  Substance and Sexual Activity  . Alcohol use: No  . Drug use: No  . Sexual activity: Not on file  Other Topics Concern  . Not on file  Social History Narrative  . Not on file   Social Determinants of Health   Financial Resource Strain:   . Difficulty of Paying Living  Expenses: Not on file  Food Insecurity:   . Worried About Charity fundraiser in the Last Year: Not on file  . Ran Out of Food in the Last Year: Not on file  Transportation Needs:   . Lack of Transportation (Medical): Not on file  . Lack of Transportation (Non-Medical): Not on file  Physical Activity:   . Days of Exercise per Week: Not on file  . Minutes of Exercise per Session: Not on file  Stress:   . Feeling of Stress : Not on file  Social Connections:   . Frequency of Communication with Friends and Family: Not on file  . Frequency of Social Gatherings with Friends and Family: Not on file  . Attends Religious Services: Not on file  . Active Member of Clubs or Organizations: Not on file  . Attends Archivist Meetings: Not on file  . Marital Status: Not on file  Intimate Partner Violence:   . Fear of Current or Ex-Partner: Not on file  . Emotionally Abused: Not on file  . Physically Abused: Not on file  . Sexually Abused: Not on file      Review of Systems  Constitutional: Negative for chills, fatigue and unexpected weight change.  HENT: Negative for congestion, rhinorrhea, sneezing and sore throat.   Eyes: Negative for photophobia, pain and redness.  Respiratory: Negative for cough, chest tightness and shortness of breath.   Cardiovascular: Negative for chest pain and palpitations.  Gastrointestinal: Negative for abdominal pain, constipation, diarrhea, nausea and vomiting.  Endocrine: Negative.   Genitourinary: Negative for dysuria and frequency.  Musculoskeletal: Negative for arthralgias, back pain, joint swelling and neck pain.  Skin: Negative for rash.  Allergic/Immunologic: Negative.   Neurological: Negative for tremors and numbness.  Hematological: Negative for adenopathy. Does not bruise/bleed easily.  Psychiatric/Behavioral: Negative for behavioral problems and sleep disturbance. The patient is not nervous/anxious.     Vital Signs: There were no vitals  taken for this visit.   Observation/Objective:  Well sounding, NAD    Assessment/Plan: 1. Cervical radiculopathy Improved with celebrex, has some residual discomfort.  Continue celebrex and use flexeril as needed.  2. Benign hypertension Controlled, continue current management.   3. Gastro-esophageal reflux disease without esophagitis Use pantoprazole as discussed.   4. Morbid (severe) obesity due to excess calories (HCC) Obesity Counseling: Risk Assessment: An assessment of behavioral risk factors was made today and includes lack of exercise sedentary lifestyle, lack of portion control and poor dietary habits.  Risk Modification Advice: She was counseled on portion control guidelines. Restricting daily caloric intake to 1600. The detrimental long term effects of obesity on her health and ongoing poor compliance was also discussed with the patient.    General Counseling: laicey scites understanding of the findings of today's phone visit and agrees with plan of treatment. I have discussed any further diagnostic evaluation that may be needed or ordered today.  We also reviewed her medications today. she has been encouraged to call the office with any questions or concerns that should arise related to todays visit.    No orders of the defined types were placed in this encounter.   Meds ordered this encounter  Medications  . pantoprazole (PROTONIX) 40 MG tablet    Sig: Take 1 tablet (40 mg total) by mouth daily.    Dispense:  30 tablet    Refill:  1    Time spent: [place] AGNP-C Internal medicine

## 2020-01-12 ENCOUNTER — Telehealth: Payer: Self-pay

## 2020-01-12 NOTE — Telephone Encounter (Signed)
CONFIRMED 01-14-20 OV AS VIRTUAL. 

## 2020-01-14 ENCOUNTER — Encounter: Payer: Self-pay | Admitting: Nurse Practitioner

## 2020-01-14 ENCOUNTER — Ambulatory Visit: Payer: Managed Care, Other (non HMO) | Admitting: Nurse Practitioner

## 2020-01-14 VITALS — BP 142/76 | HR 92 | Ht 65.0 in | Wt 316.0 lb

## 2020-01-14 DIAGNOSIS — M25561 Pain in right knee: Secondary | ICD-10-CM | POA: Diagnosis not present

## 2020-01-14 DIAGNOSIS — M542 Cervicalgia: Secondary | ICD-10-CM | POA: Diagnosis not present

## 2020-01-14 DIAGNOSIS — I10 Essential (primary) hypertension: Secondary | ICD-10-CM | POA: Diagnosis not present

## 2020-01-14 MED ORDER — CELECOXIB 200 MG PO CAPS: 200.0000 mg | ORAL_CAPSULE | Freq: Every day | ORAL | 1 refills | Status: DC

## 2020-01-14 NOTE — Progress Notes (Signed)
[facility] [place], [place] [address]  Internal MEDICINE  Telephone Visit  Patient Name: Stephanie Gates  [id number]  [id number]  Date of Service: 01/14/2020  I connected with the patient at 9:10am by webcam and verified the patients identity using two identifiers.   I discussed the limitations, risks, security and privacy concerns of performing an evaluation and management service by webcam and the availability of in person appointments. I also discussed with the patient that there may be a patient responsible charge related to the service.  The patient expressed understanding and agrees to proceed.    Chief Complaint  Patient presents with  . Telephone Assessment  . Telephone Screen  . Hypertension  . Knee Pain    right knee feels like a rubber band tightening around it, standing up and walking on it hurts, seems like it might be swolken   . Gastroesophageal Reflux    The patient has been contacted via webcam for follow up visit due to concerns for spread of novel coronavirus.  The patient presents for routine visit. States that her right knee has been bothering her for about a week. Denies injury or trauma to the knee.  Does have prescription for celebrex which she has not been taking recently. Will need to have a new prescription for this. She is taking cyclobenzaprine for her neck, but now for her knee also.States that this makes her feels sleepy. She did try putting a little heat on her knee but this did not improve the pain at all.  Blood pressure is doing well. Denies chest pain, chest pressure, headache, or other symptoms.       Current Medication: Outpatient Encounter Medications as of 01/14/2020  Medication Sig  . celecoxib (CELEBREX) 200 MG capsule Take 1 capsule (200 mg total) by mouth daily.  . Cyanocobalamin (VITAMIN B-12 PO) Take by mouth.  . cyclobenzaprine (FLEXERIL) 10 MG tablet TAKE ONE TAB PO QHS FOR NECK PAIN PRN  .  hydrochlorothiazide (MICROZIDE) 12.5 MG capsule Take 1 capsule (12.5 mg total) by mouth daily. PM  . ibuprofen (ADVIL,MOTRIN) 800 MG tablet Take 1 tablet (800 mg total) by mouth every 8 (eight) hours as needed.  . meclizine (ANTIVERT) 12.5 MG tablet Take 1 tablet (12.5 mg total) by mouth 2 (two) times daily as needed for dizziness.  . Multiple Vitamin (MULTIVITAMIN) capsule Take 1 capsule by mouth daily.  . pantoprazole (PROTONIX) 40 MG tablet TAKE 1 TABLET(40 MG) BY MOUTH DAILY  . [DISCONTINUED] celecoxib (CELEBREX) 200 MG capsule Take 1 capsule (200 mg total) by mouth daily.   No facility-administered encounter medications on file as of 01/14/2020.    Surgical History: Past Surgical History:  Procedure Laterality Date  . COLONOSCOPY N/A 05/03/2015   Procedure: COLONOSCOPY;  Surgeon: Lucilla Lame, MD;  Location: [place];  Service: Gastroenterology;  Laterality: N/A;  . ESOPHAGOGASTRODUODENOSCOPY N/A 05/03/2015   Procedure: ESOPHAGOGASTRODUODENOSCOPY (EGD);  Surgeon: Lucilla Lame, MD;  Location: [place];  Service: Gastroenterology;  Laterality: N/A;  . FOOT SURGERY  2014  . Gastric bypass surgery    . POLYPECTOMY  05/03/2015   Procedure: POLYPECTOMY INTESTINAL;  Surgeon: Lucilla Lame, MD;  Location: [place];  Service: Gastroenterology;;    Medical History: Past Medical History:  Diagnosis Date  . Allergy    seasonal  . Anemia   . Arthritis    right hip  . Headache    sinus/allergies  . Hypertension   . Seasonal allergies   .  Vertigo    1-2x/month  . Wears contact lenses     Family History: Family History  Problem Relation Age of Onset  . Diabetes Mother   . Hypertension Mother   . Cancer Father   . Diabetes Father   . Hypertension Father     Social History   Socioeconomic History  . Marital status: Single    Spouse name: Not on file  . Number of children: Not on file  . Years of education: Not on file  . Highest education level: Not  on file  Occupational History  . Not on file  Tobacco Use  . Smoking status: Never Smoker  . Smokeless tobacco: Never Used  Substance and Sexual Activity  . Alcohol use: No  . Drug use: No  . Sexual activity: Not on file  Other Topics Concern  . Not on file  Social History Narrative  . Not on file   Social Determinants of Health   Financial Resource Strain:   . Difficulty of Paying Living Expenses: Not on file  Food Insecurity:   . Worried About Charity fundraiser in the Last Year: Not on file  . Ran Out of Food in the Last Year: Not on file  Transportation Needs:   . Lack of Transportation (Medical): Not on file  . Lack of Transportation (Non-Medical): Not on file  Physical Activity:   . Days of Exercise per Week: Not on file  . Minutes of Exercise per Session: Not on file  Stress:   . Feeling of Stress : Not on file  Social Connections:   . Frequency of Communication with Friends and Family: Not on file  . Frequency of Social Gatherings with Friends and Family: Not on file  . Attends Religious Services: Not on file  . Active Member of Clubs or Organizations: Not on file  . Attends Archivist Meetings: Not on file  . Marital Status: Not on file  Intimate Partner Violence:   . Fear of Current or Ex-Partner: Not on file  . Emotionally Abused: Not on file  . Physically Abused: Not on file  . Sexually Abused: Not on file      Review of Systems  Constitutional: Positive for activity change. Negative for chills, fatigue and unexpected weight change.       Not doing as much physical activity due to right knee pain.   HENT: Negative for congestion, postnasal drip, rhinorrhea, sneezing and sore throat.   Respiratory: Negative for cough, chest tightness, shortness of breath and wheezing.   Cardiovascular: Negative for chest pain and palpitations.  Gastrointestinal: Negative for abdominal pain, constipation, diarrhea, nausea and vomiting.  Endocrine: Negative for  cold intolerance, heat intolerance, polydipsia and polyuria.  Musculoskeletal: Positive for arthralgias and myalgias. Negative for back pain, joint swelling and neck pain.       Right knee is tender and swollen. Hurts to put weight on her right leg. Denies trauma or injury to the right knee.   Skin: Negative for rash.  Allergic/Immunologic: Negative for environmental allergies.  Neurological: Negative for dizziness, tremors, numbness and headaches.  Hematological: Negative for adenopathy. Does not bruise/bleed easily.  Psychiatric/Behavioral: Negative for behavioral problems (Depression), sleep disturbance and suicidal ideas. The patient is not nervous/anxious.     Today's Vitals   01/14/20 0851  BP: (!) 142/76  Pulse: 92  Weight: (!) 316 lb (143.3 kg)  Height: 5\' 5"  (1.651 m)   Body mass index is 52.59 kg/m.  Observation/Objective:  The patient is alert and oriented. She is pleasant and answers all questions appropriately. Breathing is non-labored. She is in no acute distress at this time.    Assessment/Plan: 1. Acute pain of right knee Apply a compressive ACE bandage. Rest and elevate the affected painful area.  Apply cold compresses intermittently as needed.  As pain recedes, begin normal activities slowly as tolerated.  Call if symptoms persist over the next week to two weeks. Renewed prescription for celebrex 200mg  which may be taken during the day to relieve pain and inflammation.  - celecoxib (CELEBREX) 200 MG capsule; Take 1 capsule (200 mg total) by mouth daily.  Dispense: 90 capsule; Refill: 1  2. Benign hypertension Stable. Continue bp medication as prescribed   3. Neck pain May take cyclobenzaprine at night as needed for muscle pain/spasms.   General Counseling: shalena segrist understanding of the findings of today's phone visit and agrees with plan of treatment. I have discussed any further diagnostic evaluation that may be needed or ordered today. We also  reviewed her medications today. she has been encouraged to call the office with any questions or concerns that should arise related to todays visit.   Hypertension Counseling:   The following hypertensive lifestyle modification were recommended and discussed:  1. Limiting alcohol intake to less than 1 oz/day of ethanol:(24 oz of beer or 8 oz of wine or 2 oz of 100-proof whiskey). 2. Take baby ASA 81 mg daily. 3. Importance of regular aerobic exercise and losing weight. 4. Reduce dietary saturated fat and cholesterol intake for overall cardiovascular health. 5. Maintaining adequate dietary potassium, calcium, and magnesium intake. 6. Regular monitoring of the blood pressure. 7. Reduce sodium intake to less than 100 mmol/day (less than 2.3 gm of sodium or less than 6 gm of sodium choride)   This patient was seen by Sabina with Dr Lavera Guise as a part of collaborative care agreement  Meds ordered this encounter  Medications  . celecoxib (CELEBREX) 200 MG capsule    Sig: Take 1 capsule (200 mg total) by mouth daily.    Dispense:  90 capsule    Refill:  1    Order Specific Question:   Supervising Provider    Answer:   Lavera Guise [id number]    Time spent: 25 Minutes    Dr Lavera Guise Internal medicine

## 2020-01-18 ENCOUNTER — Ambulatory Visit: Payer: Managed Care, Other (non HMO) | Admitting: Nurse Practitioner

## 2020-01-21 ENCOUNTER — Emergency Department
Admission: EM | Admit: 2020-01-21 | Discharge: 2020-01-21 | Disposition: A | Discharge status: Home / Self Care | Payer: Managed Care, Other (non HMO) | Attending: Student in an Organized Health Care Education/Training Program | Admitting: Student in an Organized Health Care Education/Training Program

## 2020-01-21 ENCOUNTER — Encounter: Payer: Self-pay | Admitting: Emergency Medicine

## 2020-01-21 ENCOUNTER — Other Ambulatory Visit: Payer: Self-pay

## 2020-01-21 ENCOUNTER — Emergency Department: Payer: Managed Care, Other (non HMO)

## 2020-01-21 DIAGNOSIS — I10 Essential (primary) hypertension: Secondary | ICD-10-CM | POA: Insufficient documentation

## 2020-01-21 DIAGNOSIS — Z79899 Other long term (current) drug therapy: Secondary | ICD-10-CM | POA: Diagnosis not present

## 2020-01-21 DIAGNOSIS — M25561 Pain in right knee: Secondary | ICD-10-CM

## 2020-01-21 MED ORDER — PREDNISONE 10 MG PO TABS: ORAL_TABLET | ORAL | 0 refills | Status: DC

## 2020-01-21 MED ORDER — TRAMADOL HCL 50 MG PO TABS: 50.0000 mg | ORAL_TABLET | Freq: Four times a day (QID) | ORAL | 0 refills | Status: DC | PRN

## 2020-01-21 NOTE — Discharge Instructions (Addendum)
Follow-up with your primary care provider if any continued problems or concerns.  Discontinue taking the muscle relaxant and also the Celebrex.  Begin taking prednisone beginning with 6 tablets today and tapering down to 1 tablet after you have finished this medication you may return back to your Celebrex as needed.  Take tramadol every 6 hours as needed for pain.  You May elevate and apply ice to your knee.  If not improving your primary care provider may refer you to an orthopedist.  Dr. Harlow Mares is the orthopedist on call and his contact information is listed on your discharge papers.

## 2020-01-21 NOTE — ED Provider Notes (Signed)
[facility] Emergency Department Provider Note   ____________________________________________   First MD Initiated Contact with Patient 01/21/20 [phone]     (approximate)  I have reviewed the triage vital signs and the nursing notes.   HISTORY  Chief Complaint Knee Pain   HPI Stephanie Gates is a 54 y.o. female presents to the ED with complaint of right knee pain for 1 week.  Patient is unaware of any injury.  Patient states that she has been walking but now is unable due to ambulating with a limp.  Patient has been taking Celebrex and Flexeril at home without any relief.  She states that this medicine was given to her for her neck.  She denies any previous knee problems.  She rates her pain as an 8 out of 10.     Past Medical History:  Diagnosis Date  . Allergy    seasonal  . Anemia   . Arthritis    right hip  . Headache    sinus/allergies  . Hypertension   . Seasonal allergies   . Vertigo    1-2x/month  . Wears contact lenses     Patient Active Problem List   Diagnosis Date Noted  . Acute pain of right knee 01/14/2020  . Neck pain 12/30/2018  . Nontoxic goiter, unspecified 12/12/2017  . Pain in left knee 12/12/2017  . Lateral epicondylitis, left elbow 12/12/2017  . Carpal tunnel syndrome, bilateral upper limbs 12/12/2017  . Gastro-esophageal reflux disease without esophagitis 12/12/2017  . Morbid (severe) obesity due to excess calories ([place]) 12/12/2017  . Headache 12/12/2017  . Snoring 12/12/2017  . Vitamin D deficiency, unspecified 12/12/2017  . Impaired fasting glucose 12/12/2017  . Hypersomnia, unspecified 12/12/2017  . Vasomotor rhinitis 12/12/2017  . Benign hypertension 12/12/2017  . Pain in right hip 12/12/2017  . Primary generalized (osteo)arthritis 12/12/2017  . IDA (iron deficiency anemia) 06/09/2015    Past Surgical History:  Procedure Laterality Date  . COLONOSCOPY N/A 05/03/2015   Procedure: COLONOSCOPY;  Surgeon:  Lucilla Lame, MD;  Location: [facility];  Service: Gastroenterology;  Laterality: N/A;  . ESOPHAGOGASTRODUODENOSCOPY N/A 05/03/2015   Procedure: ESOPHAGOGASTRODUODENOSCOPY (EGD);  Surgeon: Lucilla Lame, MD;  Location: [facility];  Service: Gastroenterology;  Laterality: N/A;  . FOOT SURGERY  2014  . Gastric bypass surgery    . POLYPECTOMY  05/03/2015   Procedure: POLYPECTOMY INTESTINAL;  Surgeon: Lucilla Lame, MD;  Location: [facility];  Service: Gastroenterology;;    Prior to Admission medications   Medication Sig Start Date End Date Taking? Authorizing Provider  celecoxib (CELEBREX) 200 MG capsule Take 1 capsule (200 mg total) by mouth daily. 01/14/20   Ronnell Freshwater, NP  Cyanocobalamin (VITAMIN B-12 PO) Take by mouth.    [provider]  cyclobenzaprine (FLEXERIL) 10 MG tablet TAKE ONE TAB PO QHS FOR NECK PAIN PRN 03/23/19   Ronnell Freshwater, NP  hydrochlorothiazide (MICROZIDE) 12.5 MG capsule Take 1 capsule (12.5 mg total) by mouth daily. PM 08/26/19   Kendell Bane, NP  Multiple Vitamin (MULTIVITAMIN) capsule Take 1 capsule by mouth daily.    [provider]  pantoprazole (PROTONIX) 40 MG tablet TAKE 1 TABLET(40 MG) BY MOUTH DAILY 11/25/19   Ronnell Freshwater, NP  predniSONE (DELTASONE) 10 MG tablet Take 6 tablets  today, on day 2 take 5 tablets, day 3 take 4 tablets, day 4 take 3 tablets, day 5 take  2 tablets and 1 tablet the last day  01/21/20   Johnn Hai, PA-C  traMADol (ULTRAM) 50 MG tablet Take 1 tablet (50 mg total) by mouth every 6 (six) hours as needed. 01/21/20   Johnn Hai, PA-C    Allergies Penicillin g and Penicillins  Family History  Problem Relation Age of Onset  . Diabetes Mother   . Hypertension Mother   . Cancer Father   . Diabetes Father   . Hypertension Father     Social History Social History   Tobacco Use  . Smoking status: Never Smoker  . Smokeless tobacco: Never Used  Substance Use Topics  .  Alcohol use: No  . Drug use: No    Review of Systems Constitutional: No fever/chills Cardiovascular: Denies chest pain. Respiratory: Denies shortness of breath. Gastrointestinal:   No nausea, no vomiting.  Musculoskeletal: Positive right knee pain. Skin: Negative for rash. Neurological: Negative for  focal weakness or numbness. ____________________________________________   PHYSICAL EXAM:  VITAL SIGNS: ED Triage Vitals  Enc Vitals Group     BP 01/21/20 0708 (!) 181/74     Pulse Rate 01/21/20 0708 95     Resp 01/21/20 0708 18     Temp 01/21/20 0708 98.2 F (36.8 C)     Temp Source 01/21/20 0708 Oral     SpO2 01/21/20 0708 97 %     Weight 01/21/20 0708 (!) 314 lb (142.4 kg)     Height 01/21/20 0708 5\' 5"  (1.651 m)     Head Circumference --      Peak Flow --      Pain Score 01/21/20 0709 8     Pain Loc --      Pain Edu? --      Excl. in [place]? --     Constitutional: Alert and oriented. Well appearing and in no acute distress. Eyes: Conjunctivae are normal.  Head: Atraumatic. Neck: No stridor.   Cardiovascular: Normal rate, regular rhythm. Grossly normal heart sounds.  Good peripheral circulation. Respiratory: Normal respiratory effort.  No retractions. Lungs CTAB. Musculoskeletal: On examination of the right knee there is no gross deformity in comparison to the left.  No effusion is appreciated.  There is moderate tenderness on palpation of the medial aspect of the right knee.  There is minimal instability of the lateral collateral ligament while stressing it.  Medially patient is stable.  Range of motion is restricted secondary to pain.  No erythema or warmth is noted.  Gait is intact.  Neurologic:  Normal speech and language. No gross focal neurologic deficits are appreciated. No gait instability. Skin:  Skin is warm, dry and intact. No rash noted. Psychiatric: Mood and affect are normal. Speech and behavior are normal.  ____________________________________________    LABS (all labs ordered are listed, but only abnormal results are displayed)  Labs Reviewed - No data to display  RADIOLOGY   Official radiology report(s): DG Knee Complete 4 Views Right  Result Date: 01/21/2020 CLINICAL DATA:  Knee pain EXAM: RIGHT KNEE - COMPLETE 4+ VIEW COMPARISON:  05/31/2011 FINDINGS: There is no joint effusion. No fracture or dislocation. Sharpening of the tibial spines noted. Tricompartment marginal spur formation and mild joint space narrowing. 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 Electronically Signed   By: Kerby Moors M.D.   On: 01/21/2020 08:07    ____________________________________________   PROCEDURES  Procedure(s) performed (including Critical Care):  Procedures Knee immobilizer was applied by Halford Decamp, RN.  ____________________________________________   INITIAL IMPRESSION / ASSESSMENT AND PLAN / ED COURSE  As part of my medical decision making, I reviewed the following data within the [place] Notes from prior ED visits and [place] Controlled Substance Database  Truby Kilson was evaluated in Emergency Department on 01/21/2020 for the symptoms described in the history of present illness. She was evaluated in the context of the global COVID-19 pandemic, which necessitated consideration that the patient might be at risk for infection with the SARS-CoV-2 virus that causes COVID-19. Institutional protocols and algorithms that pertain to the evaluation of patients at risk for COVID-19 are in a state of rapid change based on information released by regulatory bodies including the CDC and federal and state organizations. These policies and algorithms were followed during the patient's care in the ED.  54 year old female presents to the ED with complaint of right knee pain for 1 week.  Patient denied any injury or previous injury in the past.  She has not taken any over-the-counter medication but was  prescribed Celebrex and Flexeril for her neck.  She has been taking this medication without any relief of her pain.  X-rays showed moderate arthritic changes.  Patient was made aware.  She was placed in a knee immobilizer.  Patient was made aware that she should discontinue taking the Flexeril as this will not help her knee pain.  Patient is to discontinue taking the Celebrex at this time.  Patient was given a prescription for prednisone 6-day taper along with tramadol as needed for moderate to severe pain.  Patient will restart her prescription for Celebrex after finishing the prednisone.  She is to follow-up with her PCP if any continued problems and Dr. Harlow Mares who is the orthopedist on call if any continued problems with her knee.   ____________________________________________   FINAL CLINICAL IMPRESSION(S) / ED DIAGNOSES  Final diagnoses:  Acute pain of right knee     ED Discharge Orders         Ordered    predniSONE (DELTASONE) 10 MG tablet     01/21/20 0838    traMADol (ULTRAM) 50 MG tablet  Every 6 hours PRN     01/21/20 [id number]           Note:  This document was prepared using Dragon voice recognition software and may include unintentional dictation errors.    Johnn Hai, PA-C 01/21/20 1147    Merlyn Lot, MD 01/21/20 [id number]

## 2020-01-21 NOTE — ED Notes (Signed)
See triage note  Presents with right knee pain for a few days   Denies any injury  States pain feels like a rubber band around her knee  Unable to bear fulkl wt  Ambulates with limp

## 2020-01-21 NOTE — ED Triage Notes (Signed)
Pt presents to ED via POV c/o R knee pain x1 wk. Denies known injury. Pt ambulatory to triage with limp. Has been taking Celebrex and Flexeril at home without relief.

## 2020-01-26 ENCOUNTER — Telehealth: Payer: Self-pay

## 2020-01-26 NOTE — Telephone Encounter (Signed)
Confirmed appointment on 01/28/2020 and screened for covid. klh

## 2020-01-28 ENCOUNTER — Ambulatory Visit: Payer: Managed Care, Other (non HMO) | Admitting: Nurse Practitioner

## 2020-02-04 ENCOUNTER — Telehealth: Payer: Self-pay

## 2020-02-04 NOTE — Telephone Encounter (Signed)
BILLED MISSED APPOINTMENT FEE 01/28/20

## 2020-02-22 ENCOUNTER — Telehealth: Payer: Self-pay

## 2020-02-22 NOTE — Telephone Encounter (Signed)
CONFIRMED AND SCREENED FOR 02-24-20 OV. 

## 2020-02-24 ENCOUNTER — Ambulatory Visit: Payer: Managed Care, Other (non HMO) | Admitting: Nurse Practitioner

## 2020-02-28 ENCOUNTER — Telehealth: Payer: Self-pay

## 2020-02-28 NOTE — Telephone Encounter (Signed)
Called lmom informing patient of appointment on 03/01/2020. klh

## 2020-03-01 ENCOUNTER — Other Ambulatory Visit: Payer: Self-pay

## 2020-03-01 ENCOUNTER — Ambulatory Visit: Payer: Self-pay | Admitting: Adult Health

## 2020-03-01 ENCOUNTER — Encounter: Payer: Self-pay | Admitting: Adult Health

## 2020-03-01 VITALS — BP 150/84 | HR 88 | Temp 97.9°F | Resp 16 | Ht 65.0 in | Wt 317.0 lb

## 2020-03-01 DIAGNOSIS — M1711 Unilateral primary osteoarthritis, right knee: Secondary | ICD-10-CM

## 2020-03-01 DIAGNOSIS — M25561 Pain in right knee: Secondary | ICD-10-CM | POA: Diagnosis not present

## 2020-03-01 MED ORDER — TRAMADOL HCL 50 MG PO TABS: 50.0000 mg | ORAL_TABLET | Freq: Four times a day (QID) | ORAL | 0 refills | Status: DC | PRN

## 2020-03-01 NOTE — Progress Notes (Signed)
[facility] [place], [place] [address]  Internal MEDICINE  Office Visit Note  Patient Name: Stephanie Gates  [id number]  [id number]  Date of Service: 03/01/2020  Chief Complaint  Patient presents with  . Knee Pain    right going on for few weeks      HPI Pt is here for a sick visit. Pt reports a few weeks of right knee pain.  She has been taking some tylenol and aleve for pain control with some relief.  She described the pain as a tight rubber band feeling around her right knee.  She did go to the emergency room and an x-ray was done.  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  She was sent home with prednisone and tramadol.  She has not gotten much relief with these medications.  She was instructed to follow up with Dr. Harlow Mares, orthopedic surgery. She has not called and made that appt.      Current Medication:  Outpatient Encounter Medications as of 03/01/2020  Medication Sig  . celecoxib (CELEBREX) 200 MG capsule Take 1 capsule (200 mg total) by mouth daily.  . Cyanocobalamin (VITAMIN B-12 PO) Take by mouth.  . cyclobenzaprine (FLEXERIL) 10 MG tablet TAKE ONE TAB PO QHS FOR NECK PAIN PRN  . hydrochlorothiazide (MICROZIDE) 12.5 MG capsule Take 1 capsule (12.5 mg total) by mouth daily. PM  . Multiple Vitamin (MULTIVITAMIN) capsule Take 1 capsule by mouth daily.  . pantoprazole (PROTONIX) 40 MG tablet TAKE 1 TABLET(40 MG) BY MOUTH DAILY  . traMADol (ULTRAM) 50 MG tablet Take 1 tablet (50 mg total) by mouth every 6 (six) hours as needed.  . [DISCONTINUED] traMADol (ULTRAM) 50 MG tablet Take 1 tablet (50 mg total) by mouth every 6 (six) hours as needed.  . predniSONE (DELTASONE) 10 MG tablet Take 6 tablets  today, on day 2 take 5 tablets, day 3 take 4 tablets, day 4 take 3 tablets, day 5 take  2 tablets and 1 tablet the last day (Patient not taking: Reported on 03/01/2020)   No facility-administered encounter medications  on file as of 03/01/2020.      Medical History: Past Medical History:  Diagnosis Date  . Allergy    seasonal  . Anemia   . Arthritis    right hip  . Headache    sinus/allergies  . Hypertension   . Seasonal allergies   . Vertigo    1-2x/month  . Wears contact lenses      Vital Signs: BP (!) 150/84   Pulse 88   Temp 97.9 F (36.6 C)   Resp 16   Ht 5\' 5"  (1.651 m)   Wt (!) 317 lb (143.8 kg)   LMP 02/11/2017   SpO2 98%   BMI 52.75 kg/m    Review of Systems  Constitutional: Negative for chills, fatigue and unexpected weight change.  HENT: Negative for congestion, rhinorrhea, sneezing and sore throat.   Eyes: Negative for photophobia, pain and redness.  Respiratory: Negative for cough, chest tightness and shortness of breath.   Cardiovascular: Negative for chest pain and palpitations.  Gastrointestinal: Negative for abdominal pain, constipation, diarrhea, nausea and vomiting.  Endocrine: Negative.   Genitourinary: Negative for dysuria and frequency.  Musculoskeletal: Negative for arthralgias, back pain, joint swelling and neck pain.  Skin: Negative for rash.  Allergic/Immunologic: Negative.   Neurological: Negative for tremors and numbness.  Hematological: Negative for adenopathy. Does not bruise/bleed easily.  Psychiatric/Behavioral: Negative for  behavioral problems and sleep disturbance. The patient is not nervous/anxious.     Physical Exam Vitals and nursing note reviewed.  Constitutional:      General: She is not in acute distress.    Appearance: She is well-developed. She is not diaphoretic.  HENT:     Head: Normocephalic and atraumatic.     Mouth/Throat:     Pharynx: No oropharyngeal exudate.  Eyes:     Pupils: Pupils are equal, round, and reactive to light.  Neck:     Thyroid: No thyromegaly.     Vascular: No JVD.     Trachea: No tracheal deviation.  Cardiovascular:     Rate and Rhythm: Normal rate and regular rhythm.     Heart sounds: Normal  heart sounds. No murmur. No friction rub. No gallop.   Pulmonary:     Effort: Pulmonary effort is normal. No respiratory distress.     Breath sounds: Normal breath sounds. No wheezing or rales.  Chest:     Chest wall: No tenderness.  Abdominal:     Palpations: Abdomen is soft.     Tenderness: There is no abdominal tenderness. There is no guarding.  Musculoskeletal:        General: Normal range of motion.     Cervical back: Normal range of motion and neck supple.  Lymphadenopathy:     Cervical: No cervical adenopathy.  Skin:    General: Skin is warm and dry.  Neurological:     Mental Status: She is alert and oriented to person, place, and time.     Cranial Nerves: No cranial nerve deficit.  Psychiatric:        Behavior: Behavior normal.        Thought Content: Thought content normal.        Judgment: Judgment normal.    Assessment/Plan: 1. Acute pain of right knee Pt encouraged to continue to rest.  Use anti-inflamatory agents as discussed. Use tramadol for night time pain.  Follow up with ortho for eval and treatment.  - Ambulatory referral to Orthopedic Surgery - traMADol (ULTRAM) 50 MG tablet; Take 1 tablet (50 mg total) by mouth every 6 (six) hours as needed.  Dispense: 15 tablet; Refill: 0  2. Osteoarthritis of right knee, unspecified osteoarthritis type - Ambulatory referral to Orthopedic Surgery  General Counseling: sela ruzzo understanding of the findings of todays visit and agrees with plan of treatment. I have discussed any further diagnostic evaluation that may be needed or ordered today. We also reviewed her medications today. she has been encouraged to call the office with any questions or concerns that should arise related to todays visit.   Orders Placed This Encounter  Procedures  . Ambulatory referral to Orthopedic Surgery    Meds ordered this encounter  Medications  . traMADol (ULTRAM) 50 MG tablet    Sig: Take 1 tablet (50 mg total) by mouth every  6 (six) hours as needed.    Dispense:  15 tablet    Refill:  0    Time spent: 25 Minutes  This patient was seen by Orson Gear AGNP-C in Collaboration with Dr Lavera Guise as a part of collaborative care agreement.  Kendell Bane AGNP-C Internal Medicine

## 2020-04-11 ENCOUNTER — Ambulatory Visit: Payer: Managed Care, Other (non HMO) | Admitting: Nurse Practitioner

## 2020-08-22 ENCOUNTER — Encounter: Payer: Self-pay | Admitting: Hospice and Palliative Medicine

## 2020-08-22 ENCOUNTER — Ambulatory Visit: Payer: Self-pay | Admitting: Hospice and Palliative Medicine

## 2020-08-22 ENCOUNTER — Other Ambulatory Visit: Payer: Self-pay

## 2020-08-22 DIAGNOSIS — Z0001 Encounter for general adult medical examination with abnormal findings: Secondary | ICD-10-CM

## 2020-08-22 DIAGNOSIS — I10 Essential (primary) hypertension: Secondary | ICD-10-CM

## 2020-08-22 DIAGNOSIS — M542 Cervicalgia: Secondary | ICD-10-CM

## 2020-08-22 DIAGNOSIS — R0683 Snoring: Secondary | ICD-10-CM

## 2020-08-22 DIAGNOSIS — G8929 Other chronic pain: Secondary | ICD-10-CM

## 2020-08-22 MED ORDER — HYDROCHLOROTHIAZIDE 12.5 MG PO CAPS: 12.5000 mg | ORAL_CAPSULE | Freq: Every day | ORAL | 3 refills | Status: DC

## 2020-08-22 NOTE — Progress Notes (Signed)
[facility] [place],  [address]  Internal MEDICINE  Office Visit Note  Patient Name: Stephanie Gates  [id number]  [id number]  Date of Service: 08/23/2020  Chief Complaint  Patient presents with  . Follow-up    pt is unable to lift left arm, and has left neck pain, weight loss  . Hypertension  . Quality Metric Gaps    hepC, TDAP, colonoscopy    HPI Patient is here for routine follow-up She would like to discuss options that are available to help assist her with weight loss, she has been trying to watch what she eats and tried to at least walk for exercise most days of the week Discussed her elevated BP today, she says she does not take her HCTZ on a daily basis, she was not aware that she needed to and that her blood pressures remained elevated She complains today of left neck pain that has been ongoing for several weeks but most recently the pain has started radiating to her left shoulder causing her to be unable to lift her arm fully  Current Medication: Outpatient Encounter Medications as of 08/22/2020  Medication Sig  . celecoxib (CELEBREX) 200 MG capsule Take 1 capsule (200 mg total) by mouth daily.  . Cyanocobalamin (VITAMIN B-12 PO) Take by mouth.  . cyclobenzaprine (FLEXERIL) 10 MG tablet TAKE ONE TAB PO QHS FOR NECK PAIN PRN  . hydrochlorothiazide (MICROZIDE) 12.5 MG capsule Take 1 capsule (12.5 mg total) by mouth daily. PM  . Multiple Vitamin (MULTIVITAMIN) capsule Take 1 capsule by mouth daily.  . pantoprazole (PROTONIX) 40 MG tablet TAKE 1 TABLET(40 MG) BY MOUTH DAILY  . traMADol (ULTRAM) 50 MG tablet Take 1 tablet (50 mg total) by mouth every 6 (six) hours as needed.  . [DISCONTINUED] hydrochlorothiazide (MICROZIDE) 12.5 MG capsule Take 1 capsule (12.5 mg total) by mouth daily. PM  . [DISCONTINUED] predniSONE (DELTASONE) 10 MG tablet Take 6 tablets  today, on day 2 take 5 tablets, day 3 take 4 tablets, day 4 take 3 tablets, day 5  take  2 tablets and 1 tablet the last day (Patient not taking: Reported on 03/01/2020)   No facility-administered encounter medications on file as of 08/22/2020.    Surgical History: Past Surgical History:  Procedure Laterality Date  . COLONOSCOPY N/A 05/03/2015   Procedure: COLONOSCOPY;  Surgeon: Lucilla Lame, MD;  Location: [place];  Service: Gastroenterology;  Laterality: N/A;  . ESOPHAGOGASTRODUODENOSCOPY N/A 05/03/2015   Procedure: ESOPHAGOGASTRODUODENOSCOPY (EGD);  Surgeon: Lucilla Lame, MD;  Location: [place];  Service: Gastroenterology;  Laterality: N/A;  . FOOT SURGERY  2014  . Gastric bypass surgery    . POLYPECTOMY  05/03/2015   Procedure: POLYPECTOMY INTESTINAL;  Surgeon: Lucilla Lame, MD;  Location: [place];  Service: Gastroenterology;;    Medical History: Past Medical History:  Diagnosis Date  . Allergy    seasonal  . Anemia   . Arthritis    right hip  . Headache    sinus/allergies  . Hypertension   . Seasonal allergies   . Vertigo    1-2x/month  . Wears contact lenses     Family History: Family History  Problem Relation Age of Onset  . Diabetes Mother   . Hypertension Mother   . Cancer Father   . Diabetes Father   . Hypertension Father     Social History   Socioeconomic History  . Marital status: Single    Spouse name: Not on  file  . Number of children: Not on file  . Years of education: Not on file  . Highest education level: Not on file  Occupational History  . Not on file  Tobacco Use  . Smoking status: Never Smoker  . Smokeless tobacco: Never Used  Substance and Sexual Activity  . Alcohol use: No  . Drug use: No  . Sexual activity: Not on file  Other Topics Concern  . Not on file  Social History Narrative  . Not on file   Social Determinants of Health   Financial Resource Strain:   . Difficulty of Paying Living Expenses: Not on file  Food Insecurity:   . Worried About Charity fundraiser in the Last  Year: Not on file  . Ran Out of Food in the Last Year: Not on file  Transportation Needs:   . Lack of Transportation (Medical): Not on file  . Lack of Transportation (Non-Medical): Not on file  Physical Activity:   . Days of Exercise per Week: Not on file  . Minutes of Exercise per Session: Not on file  Stress:   . Feeling of Stress : Not on file  Social Connections:   . Frequency of Communication with Friends and Family: Not on file  . Frequency of Social Gatherings with Friends and Family: Not on file  . Attends Religious Services: Not on file  . Active Member of Clubs or Organizations: Not on file  . Attends Archivist Meetings: Not on file  . Marital Status: Not on file  Intimate Partner Violence:   . Fear of Current or Ex-Partner: Not on file  . Emotionally Abused: Not on file  . Physically Abused: Not on file  . Sexually Abused: Not on file   Review of Systems  Constitutional: Negative for chills, diaphoresis and fatigue.  HENT: Negative for ear pain, postnasal drip and sinus pressure.   Eyes: Negative for photophobia, discharge, redness, itching and visual disturbance.  Respiratory: Negative for cough, shortness of breath and wheezing.   Cardiovascular: Negative for chest pain, palpitations and leg swelling.  Gastrointestinal: Negative for abdominal pain, constipation, diarrhea, nausea and vomiting.  Genitourinary: Negative for dysuria and flank pain.  Musculoskeletal: Positive for neck pain. Negative for arthralgias, back pain and gait problem.       Left shoulder pain, decreased ROM  Skin: Negative for color change.  Allergic/Immunologic: Negative for environmental allergies and food allergies.  Neurological: Negative for dizziness and headaches.  Hematological: Does not bruise/bleed easily.  Psychiatric/Behavioral: Negative for agitation, behavioral problems (depression) and hallucinations.    Vital Signs: BP (!) 141/76   Pulse 74   Temp 98.2 F (36.8  C)   Resp 16   Ht 5\' 5"  (1.651 m)   Wt (!) 322 lb 9.6 oz (146.3 kg)   LMP 02/11/2017   SpO2 98%   BMI 53.68 kg/m    Physical Exam Vitals reviewed.  Constitutional:      Appearance: Normal appearance. She is obese.  HENT:     Mouth/Throat:     Mouth: Mucous membranes are moist.     Pharynx: Oropharynx is clear.  Cardiovascular:     Rate and Rhythm: Normal rate and regular rhythm.     Pulses: Normal pulses.     Heart sounds: Normal heart sounds.  Pulmonary:     Effort: Pulmonary effort is normal.     Breath sounds: Normal breath sounds.  Abdominal:     General: Abdomen is flat.  Palpations: Abdomen is soft.  Musculoskeletal:     Left shoulder: Decreased range of motion.     Cervical back: Decreased range of motion.  Skin:    General: Skin is warm.  Neurological:     General: No focal deficit present.     Mental Status: She is alert and oriented to person, place, and time. Mental status is at baseline.  Psychiatric:        Mood and Affect: Mood normal.        Behavior: Behavior normal.        Thought Content: Thought content normal.        Judgment: Judgment normal.   Assessment/Plan: 1. Morbid (severe) obesity due to excess calories [facility]) Discussed the importance of weight loss, she has been trying to work on her weight management without success. Will start with metabolic testing as well as reviewing routine labs. Based on these results will create an individualized plan to assist with weight loss. - Metabolic Test  2. Essential (primary) hypertension BP elevated today. Advised that she must adhere to her medication for BP control. We will need to achieve BP control before beginning medication to assist with weight loss if needed in future.  3. Chronic neck pain Neck pain now radiating down to left shoulder causing decreased ROM. Will obtain MRI for review, depending upon results may refer to ortho or have her set up for PT. - MR Cervical Spine Wo Contrast;  Future  4. Snoring Will obtain PSG to assess for OSA due to her underlying obesity as well as uncontrolled elevated BP. Will adjust plan of care according to results. - PSG Sleep Study; Future  Labs ordered and will be reviewed at her upcoming CPE - CBC w/Diff/Platelet - Comprehensive Metabolic Panel (CMET) - Lipid Panel With LDL/HDL Ratio - TSH + free T4 - Vitamin D 1,25 dihydroxy - B12  General Counseling: Alfreda verbalizes understanding of the findings of todays visit and agrees with plan of treatment. I have discussed any further diagnostic evaluation that may be needed or ordered today. We also reviewed her medications today. she has been encouraged to call the office with any questions or concerns that should arise related to todays visit.    Orders Placed This Encounter  Procedures  . Metabolic Test  . MR Cervical Spine Wo Contrast  . CBC w/Diff/Platelet  . Comprehensive Metabolic Panel (CMET)  . Lipid Panel With LDL/HDL Ratio  . TSH + free T4  . Vitamin D 1,25 dihydroxy  . B12  . PSG Sleep Study    Meds ordered this encounter  Medications  . hydrochlorothiazide (MICROZIDE) 12.5 MG capsule    Sig: Take 1 capsule (12.5 mg total) by mouth daily. PM    Dispense:  90 capsule    Refill:  3    Time spent: 35 Minutes Time spent includes review of chart, medications, test results and follow-up plan with the patient.  This patient was seen by Theodoro Grist AGNP-C in Collaboration with Dr Lavera Guise as a part of collaborative care agreement     Tanna Furry. Trent Theisen AGNP-C Internal medicine

## 2020-08-23 ENCOUNTER — Encounter: Payer: Self-pay | Admitting: Hospice and Palliative Medicine

## 2020-09-01 NOTE — Addendum Note (Signed)
Addended by: Luiz Ochoa on: 09/01/2020 08:45 AM   Modules accepted: Orders

## 2020-09-17 ENCOUNTER — Other Ambulatory Visit: Payer: Self-pay | Admitting: Hospice and Palliative Medicine

## 2020-09-17 DIAGNOSIS — M542 Cervicalgia: Secondary | ICD-10-CM

## 2020-09-17 NOTE — Progress Notes (Signed)
Referral to ortho placed, MRI denied.

## 2020-09-27 ENCOUNTER — Telehealth: Payer: Self-pay

## 2020-09-27 NOTE — Telephone Encounter (Signed)
Patient has been advised to call [facility] for appointment and call feeling great to follow up on sleep study. Stephanie Gates

## 2020-10-16 NOTE — Progress Notes (Signed)
Labs reviewed, will discuss at upcoming appt.

## 2020-10-20 LAB — COMPREHENSIVE METABOLIC PANEL
ALT: 19 IU/L (ref 0–32)
AST: 15 IU/L (ref 0–40)
Albumin/Globulin Ratio: 1.3 (ref 1.2–2.2)
Albumin: 4 g/dL (ref 3.8–4.9)
Alkaline Phosphatase: 118 IU/L (ref 44–121)
BUN/Creatinine Ratio: 11 (ref 9–23)
BUN: 10 mg/dL (ref 6–24)
Bilirubin Total: 0.4 mg/dL (ref 0.0–1.2)
CO2: 25 mmol/L (ref 20–29)
Calcium: 9.5 mg/dL (ref 8.7–10.2)
Chloride: 106 mmol/L (ref 96–106)
Creatinine, Ser: 0.87 mg/dL (ref 0.57–1.00)
GFR calc Af Amer: 87 mL/min/{1.73_m2} (ref >59)
GFR calc non Af Amer: 76 mL/min/{1.73_m2} (ref >59)
Globulin, Total: 3.2 g/dL (ref 1.5–4.5)
Glucose: 92 mg/dL (ref 65–99)
Potassium: 4.1 mmol/L (ref 3.5–5.2)
Sodium: 142 mmol/L (ref 134–144)
Total Protein: 7.2 g/dL (ref 6.0–8.5)

## 2020-10-20 LAB — LIPID PANEL WITH LDL/HDL RATIO
Cholesterol, Total: 153 mg/dL (ref 100–199)
HDL: 53 mg/dL (ref >39)
LDL Chol Calc (NIH): 82 mg/dL (ref 0–99)
LDL/HDL Ratio: 1.5 ratio (ref 0.0–3.2)
Triglycerides: 95 mg/dL (ref 0–149)
VLDL Cholesterol Cal: 18 mg/dL (ref 5–40)

## 2020-10-20 LAB — VITAMIN B12: Vitamin B-12: 1189 pg/mL (ref 232–1245)

## 2020-10-20 LAB — TSH+FREE T4
Free T4: 1.26 ng/dL (ref 0.82–1.77)
TSH: 2.58 u[IU]/mL (ref 0.450–4.500)

## 2020-10-20 LAB — CBC WITH DIFFERENTIAL/PLATELET
Basophils Absolute: 0 10*3/uL (ref 0.0–0.2)
Basos: 0 %
EOS (ABSOLUTE): 0.1 10*3/uL (ref 0.0–0.4)
Eos: 1 %
Hematocrit: 38.1 % (ref 34.0–46.6)
Hemoglobin: 11.9 g/dL (ref 11.1–15.9)
Immature Grans (Abs): 0 10*3/uL (ref 0.0–0.1)
Immature Granulocytes: 0 %
Lymphocytes Absolute: 3.1 10*3/uL (ref 0.7–3.1)
Lymphs: 28 %
MCH: 24.8 pg — ABNORMAL LOW (ref 26.6–33.0)
MCHC: 31.2 g/dL — ABNORMAL LOW (ref 31.5–35.7)
MCV: 80 fL (ref 79–97)
Monocytes Absolute: 0.8 10*3/uL (ref 0.1–0.9)
Monocytes: 7 %
Neutrophils Absolute: 7.1 10*3/uL — ABNORMAL HIGH (ref 1.4–7.0)
Neutrophils: 64 %
Platelets: 349 10*3/uL (ref 150–450)
RBC: 4.79 x10E6/uL (ref 3.77–5.28)
RDW: 15.3 % (ref 11.7–15.4)
WBC: 11.1 10*3/uL — ABNORMAL HIGH (ref 3.4–10.8)

## 2020-10-20 LAB — VITAMIN D 1,25 DIHYDROXY
Vitamin D 1, 25 (OH)2 Total: 89 pg/mL — ABNORMAL HIGH
Vitamin D2 1, 25 (OH)2: <10 pg/mL
Vitamin D3 1, 25 (OH)2: 89 pg/mL

## 2020-10-24 ENCOUNTER — Ambulatory Visit (INDEPENDENT_AMBULATORY_CARE_PROVIDER_SITE_OTHER): Payer: Self-pay | Admitting: Hospice and Palliative Medicine

## 2020-10-24 ENCOUNTER — Other Ambulatory Visit: Payer: Self-pay

## 2020-10-24 ENCOUNTER — Encounter: Payer: Self-pay | Admitting: Hospice and Palliative Medicine

## 2020-10-24 DIAGNOSIS — Z1211 Encounter for screening for malignant neoplasm of colon: Secondary | ICD-10-CM

## 2020-10-24 DIAGNOSIS — I10 Essential (primary) hypertension: Secondary | ICD-10-CM

## 2020-10-24 DIAGNOSIS — K219 Gastro-esophageal reflux disease without esophagitis: Secondary | ICD-10-CM | POA: Diagnosis not present

## 2020-10-24 DIAGNOSIS — Z1231 Encounter for screening mammogram for malignant neoplasm of breast: Secondary | ICD-10-CM

## 2020-10-24 DIAGNOSIS — R300 Dysuria: Secondary | ICD-10-CM

## 2020-10-24 DIAGNOSIS — Z23 Encounter for immunization: Secondary | ICD-10-CM

## 2020-10-24 DIAGNOSIS — Z0001 Encounter for general adult medical examination with abnormal findings: Secondary | ICD-10-CM

## 2020-10-24 DIAGNOSIS — Z124 Encounter for screening for malignant neoplasm of cervix: Secondary | ICD-10-CM | POA: Diagnosis not present

## 2020-10-24 MED ORDER — BUPROPION HCL ER (XL) 150 MG PO TB24: 150.0000 mg | ORAL_TABLET | Freq: Every day | ORAL | 0 refills | Status: DC

## 2020-10-24 MED ORDER — PANTOPRAZOLE SODIUM 40 MG PO TBEC: DELAYED_RELEASE_TABLET | ORAL | 0 refills | Status: DC

## 2020-10-24 NOTE — Progress Notes (Signed)
[address] [place], [place] [address]  Internal MEDICINE  Office Visit Note  Patient Name: Stephanie Gates  [id number]  [id number]  Date of Service: 10/26/2020  Chief Complaint  Patient presents with  . Annual Exam  . Quality Metric Gaps    Hep C screen, HIV screen, colonoscopy (last done 2015), Tdap  . controlled substance policy    acknowledged  . Neck Pain    was referred to [facility]     HPI Pt is here for routine health maintenance examination Overall things have been going well, no recent changes in history or medications Continues to have neck pain--was referred to ortho at prior appt--never called them back to schedule appt Discussed her elevated BP today--has not been routinely taking her HCTZ--last dose was several weeks ago, denies headaches, chest pain or visual disturbances Would like to discuss weight loss therapy--has struggled with her weight for a long time, yo-yo diets and exercise have not been successful in the past  Need clarification on colonoscopy--needs mammogram  Current Medication: Outpatient Encounter Medications as of 10/24/2020  Medication Sig  . celecoxib (CELEBREX) 200 MG capsule Take 1 capsule (200 mg total) by mouth daily.  . Cyanocobalamin (VITAMIN B-12 PO) Take by mouth.  . cyclobenzaprine (FLEXERIL) 10 MG tablet TAKE ONE TAB PO QHS FOR NECK PAIN PRN  . hydrochlorothiazide (MICROZIDE) 12.5 MG capsule Take 1 capsule (12.5 mg total) by mouth daily. PM  . Multiple Vitamin (MULTIVITAMIN) capsule Take 1 capsule by mouth daily.  . pantoprazole (PROTONIX) 40 MG tablet TAKE 1 TABLET(40 MG) BY MOUTH DAILY  . traMADol (ULTRAM) 50 MG tablet Take 1 tablet (50 mg total) by mouth every 6 (six) hours as needed.  . [DISCONTINUED] pantoprazole (PROTONIX) 40 MG tablet TAKE 1 TABLET(40 MG) BY MOUTH DAILY  . buPROPion (WELLBUTRIN XL) 150 MG 24 hr tablet Take 1 tablet (150 mg total) by mouth daily.   No  facility-administered encounter medications on file as of 10/24/2020.    Surgical History: Past Surgical History:  Procedure Laterality Date  . COLONOSCOPY N/A 05/03/2015   Procedure: COLONOSCOPY;  Surgeon: Lucilla Lame, MD;  Location: [facility];  Service: Gastroenterology;  Laterality: N/A;  . ESOPHAGOGASTRODUODENOSCOPY N/A 05/03/2015   Procedure: ESOPHAGOGASTRODUODENOSCOPY (EGD);  Surgeon: Lucilla Lame, MD;  Location: [facility];  Service: Gastroenterology;  Laterality: N/A;  . FOOT SURGERY  2014  . Gastric bypass surgery    . POLYPECTOMY  05/03/2015   Procedure: POLYPECTOMY INTESTINAL;  Surgeon: Lucilla Lame, MD;  Location: [facility];  Service: Gastroenterology;;    Medical History: Past Medical History:  Diagnosis Date  . Allergy    seasonal  . Anemia   . Arthritis    right hip  . Headache    sinus/allergies  . Hypertension   . Seasonal allergies   . Vertigo    1-2x/month  . Wears contact lenses     Family History: Family History  Problem Relation Age of Onset  . Diabetes Mother   . Hypertension Mother   . Cancer Father   . Diabetes Father   . Hypertension Father       Review of Systems  Constitutional: Negative for chills, diaphoresis and fatigue.  HENT: Negative for ear pain, postnasal drip and sinus pressure.   Eyes: Negative for photophobia, discharge, redness, itching and visual disturbance.  Respiratory: Negative for cough, shortness of breath and wheezing.   Cardiovascular: Negative for chest pain, palpitations and leg swelling.  Gastrointestinal: Negative for abdominal pain, constipation, diarrhea, nausea and vomiting.  Genitourinary: Negative for dysuria and flank pain.  Musculoskeletal: Positive for neck pain and neck stiffness. Negative for arthralgias, back pain and gait problem.  Skin: Negative for color change.  Allergic/Immunologic: Negative for environmental allergies and food allergies.  Neurological: Negative for  dizziness and headaches.  Hematological: Does not bruise/bleed easily.  Psychiatric/Behavioral: Negative for agitation, behavioral problems (depression) and hallucinations.     Vital Signs: BP (!) 162/94   Pulse 92   Temp 98.2 F (36.8 C)   Resp 16   Ht 5' 5" (1.651 m)   Wt (!) 324 lb 12.8 oz (147.3 kg)   LMP 02/11/2017   SpO2 98%   BMI 54.05 kg/m    Physical Exam Vitals reviewed.  Constitutional:      Appearance: Normal appearance. She is obese.  HENT:     Right Ear: Tympanic membrane normal.     Left Ear: Tympanic membrane normal.     Nose: Nose normal.     Mouth/Throat:     Mouth: Mucous membranes are moist.     Pharynx: Oropharynx is clear.  Eyes:     Pupils: Pupils are equal, round, and reactive to light.  Cardiovascular:     Rate and Rhythm: Normal rate and regular rhythm.     Pulses: Normal pulses.     Heart sounds: Normal heart sounds.  Pulmonary:     Effort: Pulmonary effort is normal.     Breath sounds: Normal breath sounds.  Chest:     Breasts:        Right: Normal.   Abdominal:     General: Abdomen is flat.     Palpations: Abdomen is soft.  Musculoskeletal:        General: Normal range of motion.  Skin:    General: Skin is warm.  Neurological:     General: No focal deficit present.     Mental Status: She is alert and oriented to person, place, and time. Mental status is at baseline.  Psychiatric:        Mood and Affect: Mood normal.        Behavior: Behavior normal.        Thought Content: Thought content normal.      LABS: Recent Results (from the past 2160 hour(s))  CBC w/Diff/Platelet     Status: Abnormal   Collection Time: 10/13/20  7:24 AM  Result Value Ref Range   WBC 11.1 (H) 3.4 - 10.8 x10E3/uL   RBC 4.79 3.77 - 5.28 x10E6/uL   Hemoglobin 11.9 11.1 - 15.9 g/dL   Hematocrit 38.1 34.0 - 46.6 %   MCV 80 79 - 97 fL   MCH 24.8 (L) 26.6 - 33.0 pg   MCHC 31.2 (L) 31 - 35 g/dL   RDW 15.3 11.7 - 15.4 %   Platelets 349 150 - 450  x10E3/uL   Neutrophils 64 Not Estab. %   Lymphs 28 Not Estab. %   Monocytes 7 Not Estab. %   Eos 1 Not Estab. %   Basos 0 Not Estab. %   Neutrophils Absolute 7.1 (H) 1.40 - 7.00 x10E3/uL   Lymphocytes Absolute 3.1 0 - 3 x10E3/uL   Monocytes Absolute 0.8 0 - 0 x10E3/uL   EOS (ABSOLUTE) 0.1 0.0 - 0.4 x10E3/uL   Basophils Absolute 0.0 0 - 0 x10E3/uL   Immature Granulocytes 0 Not Estab. %   Immature Grans (Abs) 0.0 0.0 - 0.1 x10E3/uL  Comprehensive  Metabolic Panel (CMET)     Status: None   Collection Time: 10/13/20  7:24 AM  Result Value Ref Range   Glucose 92 65 - 99 mg/dL   BUN 10 6 - 24 mg/dL   Creatinine, Ser 0.87 0.57 - 1.00 mg/dL   GFR calc non Af Amer 76 >59 mL/min/1.73   GFR calc Af Amer 87 >59 mL/min/1.73    Comment: **In accordance with recommendations from the NKF-ASN Task force,**   [facility] is in the process of updating its eGFR calculation to the   2021 CKD-EPI creatinine equation that estimates kidney function   without a race variable.    BUN/Creatinine Ratio 11 9 - 23   Sodium 142 134 - 144 mmol/L   Potassium 4.1 3.5 - 5.2 mmol/L   Chloride 106 96 - 106 mmol/L   CO2 25 20 - 29 mmol/L   Calcium 9.5 8.7 - 10.2 mg/dL   Total Protein 7.2 6.0 - 8.5 g/dL   Albumin 4.0 3.8 - 4.9 g/dL   Globulin, Total 3.2 1.5 - 4.5 g/dL   Albumin/Globulin Ratio 1.3 1.2 - 2.2   Bilirubin Total 0.4 0.0 - 1.2 mg/dL   Alkaline Phosphatase 118 44 - 121 IU/L    Comment:               **Please note reference interval change**   AST 15 0 - 40 IU/L   ALT 19 0 - 32 IU/L  Lipid Panel With LDL/HDL Ratio     Status: None   Collection Time: 10/13/20  7:24 AM  Result Value Ref Range   Cholesterol, Total 153 100 - 199 mg/dL   Triglycerides 95 0 - 149 mg/dL   HDL 53 >39 mg/dL   VLDL Cholesterol Cal 18 5 - 40 mg/dL   LDL Chol Calc (NIH) 82 0 - 99 mg/dL   LDL/HDL Ratio 1.5 0.0 - 3.2 ratio    Comment:                                     LDL/HDL Ratio                                             Men   Women                               1/2 Avg.Risk  1.0    1.5                                   Avg.Risk  3.6    3.2                                2X Avg.Risk  6.2    5.0                                3X Avg.Risk  8.0    6.1   TSH + free T4     Status: None   Collection Time: 10/13/20  7:24 AM  Result Value Ref Range  TSH 2.580 0.450 - 4.500 uIU/mL   Free T4 1.26 0.82 - 1.77 ng/dL  Vitamin D 1,25 dihydroxy     Status: Abnormal   Collection Time: 10/13/20  7:24 AM  Result Value Ref Range   Vitamin D 1, 25 (OH)2 Total 89 (H) pg/mL    Comment: Reference Range: Adults: 21 - 65    Vitamin D2 1, 25 (OH)2 <10 pg/mL    Comment: This test was developed and its performance characteristics determined by [facility]. It has not been cleared or approved by the Food and Drug Administration.    Vitamin D3 1, 25 (OH)2 89 pg/mL    Comment: This test was developed and its performance characteristics determined by [facility]. It has not been cleared or approved by the Food and Drug Administration.   B12     Status: None   Collection Time: 10/13/20  7:24 AM  Result Value Ref Range   Vitamin B-12 1,189 232 - 1,245 pg/mL  UA/M w/rflx Culture, Routine     Status: Abnormal   Collection Time: 10/24/20 11:15 AM   Specimen: Urine   Urine  Result Value Ref Range   Specific Gravity, UA 1.020 1.005 - 1.030   pH, UA 6.5 5.0 - 7.5   Color, UA Yellow Yellow   Appearance Ur Clear Clear   Leukocytes,UA Negative Negative   Protein,UA Negative Negative/Trace   Glucose, UA Negative Negative   Ketones, UA Negative Negative   RBC, UA Negative Negative   Bilirubin, UA Negative Negative   Urobilinogen, Ur 2.0 (H) 0.2 - 1.0 mg/dL   Nitrite, UA Negative Negative   Microscopic Examination Comment     Comment: Microscopic follows if indicated.   Microscopic Examination See below:     Comment: Microscopic was indicated and was performed.   Urinalysis Reflex Comment     Comment: This specimen will not reflex to a  Urine Culture.  Microscopic Examination     Status: None   Collection Time: 10/24/20 11:15 AM   Urine  Result Value Ref Range   WBC, UA None seen 0 - 5 /hpf   RBC 0-2 0 - 2 /hpf   Epithelial Cells (non renal) 0-10 0 - 10 /hpf   Casts None seen None seen /lpf   Bacteria, UA None seen None seen/Few   Assessment/Plan: 1. Encounter for routine adult health examination with abnormal findings Well appearing 54 year old [place] female Reviewed annual labs--stable Needs colonoscopy as well as mammogram, otherwise up to date on PHM  2. Gastro-esophageal reflux disease without esophagitis Symptoms well controlled at this time, requesting refills-will continue with monitoring - pantoprazole (PROTONIX) 40 MG tablet; TAKE 1 TABLET(40 MG) BY MOUTH DAILY  Dispense: 90 tablet; Refill: 0  3. Encounter for screening mammogram for malignant neoplasm of breast - MM Digital Screening; Future  4. Screening for colon cancer Per Dr. Dorothey Baseman office patient does need to have colonoscopy every 5 years - Ambulatory referral to Gastroenterology  5. Morbid (severe) obesity due to excess calories (HCC) BMI 54--will start Wellbutrin to assist with weight loss May benefit also from low dose Topamax if able to tolerate Will need to avoid stimulant therapy for appetite suppression due to elevated BP Advised she must work on healthy eating habits as well as daily exercise such as brisk walking for 20-30 minutes to achieve weight loss goals - buPROPion (WELLBUTRIN XL) 150 MG 24 hr tablet; Take 1 tablet (150 mg total) by mouth daily.  Dispense: 30 tablet; Refill: 0  6. Essential primary hypertension Advised to be compliant with HCTZ--will need to take daily for BP control Once she starts taking routinely will need to assess BP, may need to adjust therapy  7. Dysuria - UA/M w/rflx Culture, Routine - Microscopic Examination  8. Flu vaccine need - Flu Vaccine MDCK QUAD PF  General Counseling: Varetta verbalizes  understanding of the findings of todays visit and agrees with plan of treatment. I have discussed any further diagnostic evaluation that may be needed or ordered today. We also reviewed her medications today. she has been encouraged to call the office with any questions or concerns that should arise related to todays visit.    Counseling: Obesity Counseling: Risk Assessment: An assessment of behavioral risk factors was made today and includes lack of exercise sedentary lifestyle, lack of portion control and poor dietary habits.  Risk Modification Advice: She was counseled on portion control guidelines. Restricting daily caloric intake to 1800. The detrimental long term effects of obesity on her health and ongoing poor compliance was also discussed with the patient.   Orders Placed This Encounter  Procedures  . Microscopic Examination  . MM Digital Screening  . Flu Vaccine MDCK QUAD PF  . UA/M w/rflx Culture, Routine    Meds ordered this encounter  Medications  . buPROPion (WELLBUTRIN XL) 150 MG 24 hr tablet    Sig: Take 1 tablet (150 mg total) by mouth daily.    Dispense:  30 tablet    Refill:  0  . pantoprazole (PROTONIX) 40 MG tablet    Sig: TAKE 1 TABLET(40 MG) BY MOUTH DAILY    Dispense:  90 tablet    Refill:  0    **Patient requests 90 days supply**    Total time spent:35 Minutes  Time spent includes review of chart, medications, test results, and follow up plan with the patient.   This patient was seen by Casey Burkitt AGNP-C Collaboration with Dr Lavera Guise as a part of collaborative care agreement   Tanna Furry. [facility] Internal Medicine

## 2020-10-25 LAB — UA/M W/RFLX CULTURE, ROUTINE
Bilirubin, UA: NEGATIVE
Glucose, UA: NEGATIVE
Ketones, UA: NEGATIVE
Leukocytes,UA: NEGATIVE
Nitrite, UA: NEGATIVE
Protein,UA: NEGATIVE
RBC, UA: NEGATIVE
Specific Gravity, UA: 1.02 (ref 1.005–1.030)
Urobilinogen, Ur: 2 mg/dL — ABNORMAL HIGH (ref 0.2–1.0)
pH, UA: 6.5 (ref 5.0–7.5)

## 2020-10-25 LAB — MICROSCOPIC EXAMINATION
Bacteria, UA: NONE SEEN
Casts: NONE SEEN /lpf
WBC, UA: NONE SEEN /hpf (ref 0–5)

## 2020-10-26 ENCOUNTER — Encounter: Payer: Self-pay | Admitting: Hospice and Palliative Medicine

## 2020-10-26 ENCOUNTER — Telehealth: Payer: Self-pay | Admitting: Gastroenterology

## 2020-10-26 ENCOUNTER — Telehealth: Payer: Self-pay

## 2020-10-26 NOTE — Telephone Encounter (Signed)
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  Her last colonoscopy was in 2016 so this year should be her next one.

## 2020-10-26 NOTE — Telephone Encounter (Signed)
Spoke with Desha at [facility] her the colon repeat was 5 years. Colonoscopy report and pathology was faxed to their office for pt's records.

## 2020-10-26 NOTE — Telephone Encounter (Signed)
As per taylor I called [facility] and spoke to [facility] in RE of pt.  Stephanie Gates did say they recommend her colonoscopy every 5 yrs due to pt having a 6 mm polyp and descending colon.  I called and informed pt that we are putting a referral in for her colonoscopy and that if Dr Allen Norris feels she doesn't need until 10 yrs we can go from there.  Stephanie Gates will let us know.

## 2020-10-26 NOTE — Telephone Encounter (Signed)
[facility] calling wanting to know when patient is due for colon recall. Patients last colon was 5.25.16 and it states "every 5 year" on results, but pt states Dr. Allen Norris told her every 10 years. No letter was sent to patient. Please review and advise on recall. Thanks

## 2020-11-07 IMAGING — MG DIGITAL SCREENING BILAT W/ TOMO
8 series · 8 of 24 positions shown · non-contrast
Comparison: Previous exam(s).

CLINICAL DATA: Screening.

EXAM:
DIGITAL SCREENING BILATERAL MAMMOGRAM WITH TOMO AND CAD

[L MLO synth-2D]
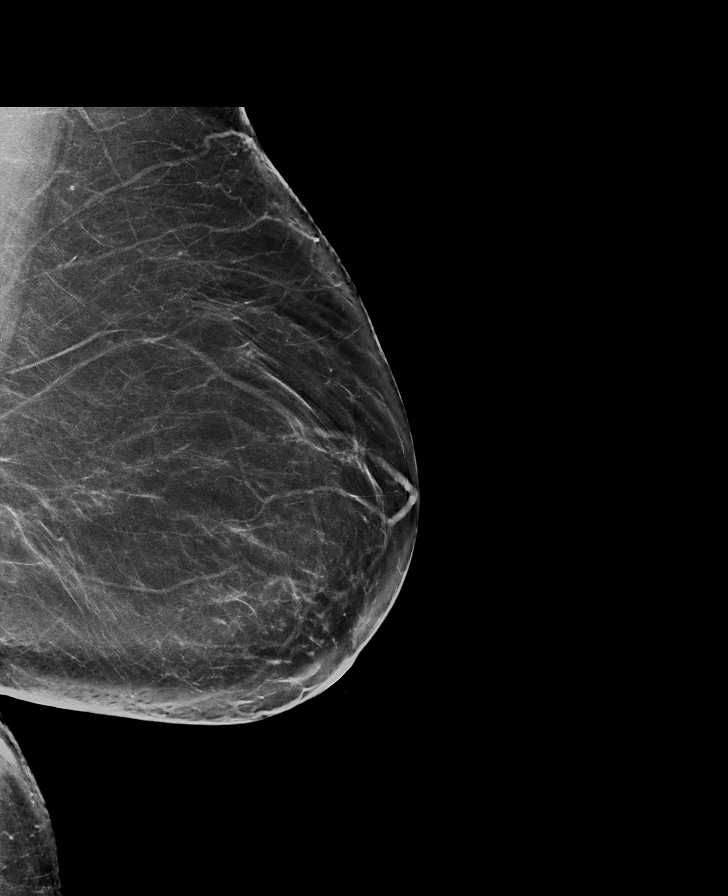

[R CC synth-2D]
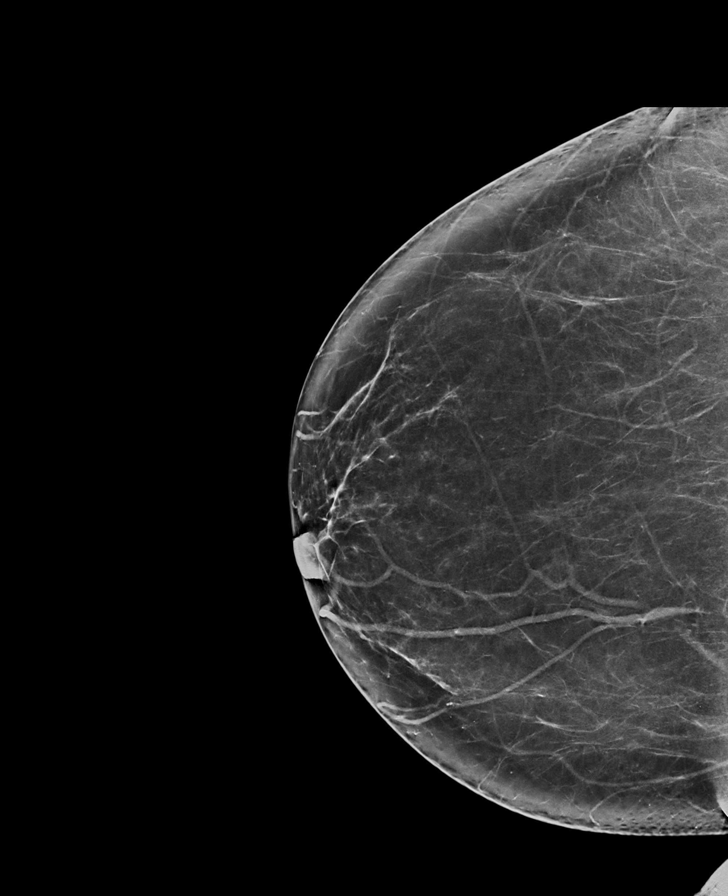

[L CC synth-2D]
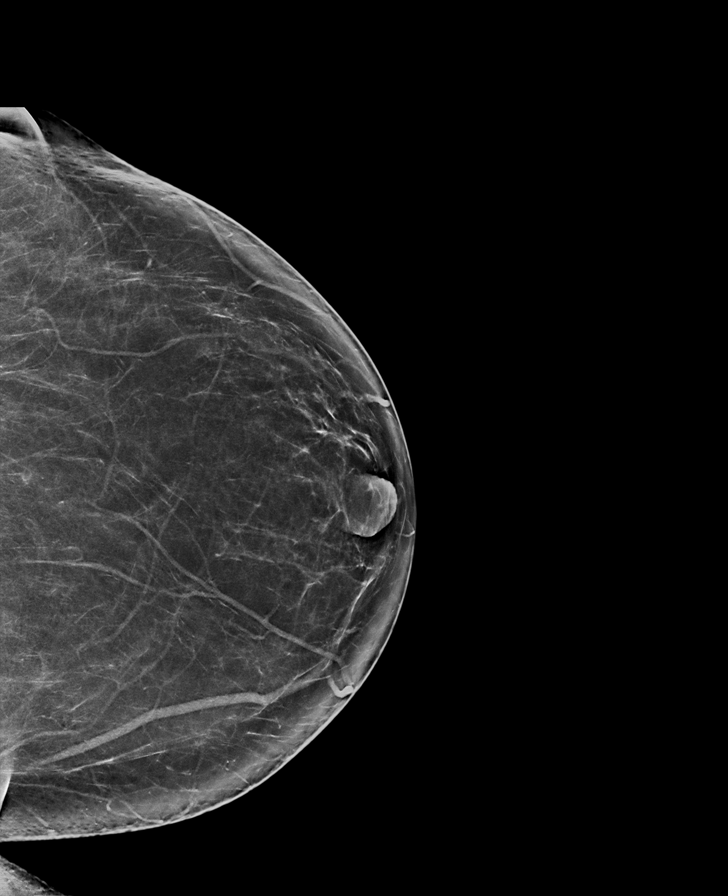

[R MLO synth-2D]
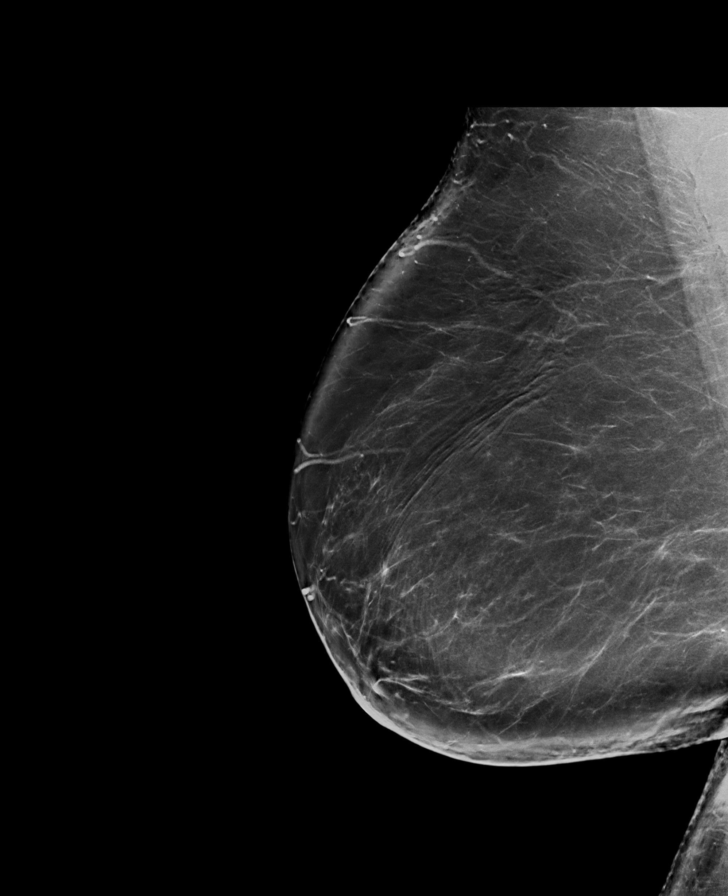

[L MLO tomo · tomo slice 45/89.0]
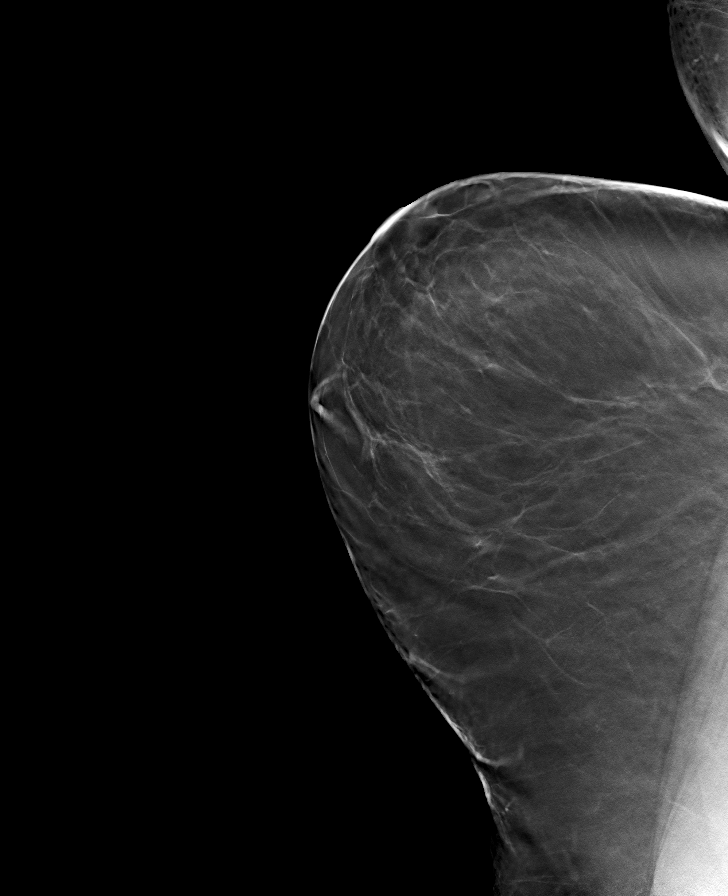

[L CC tomo · tomo slice 41/81.0]
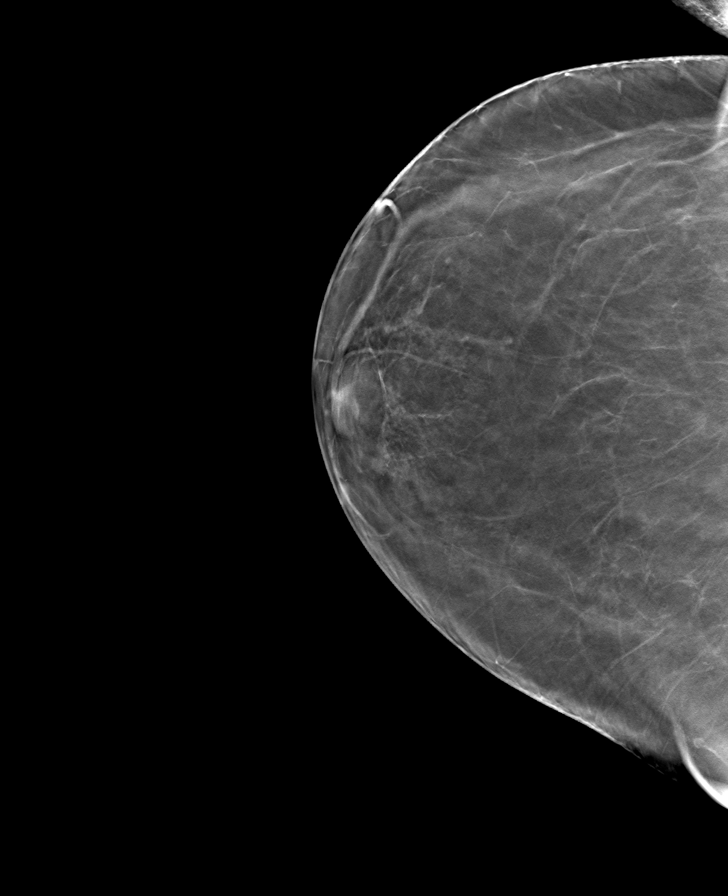

[R CC tomo · tomo slice 43/85.0]
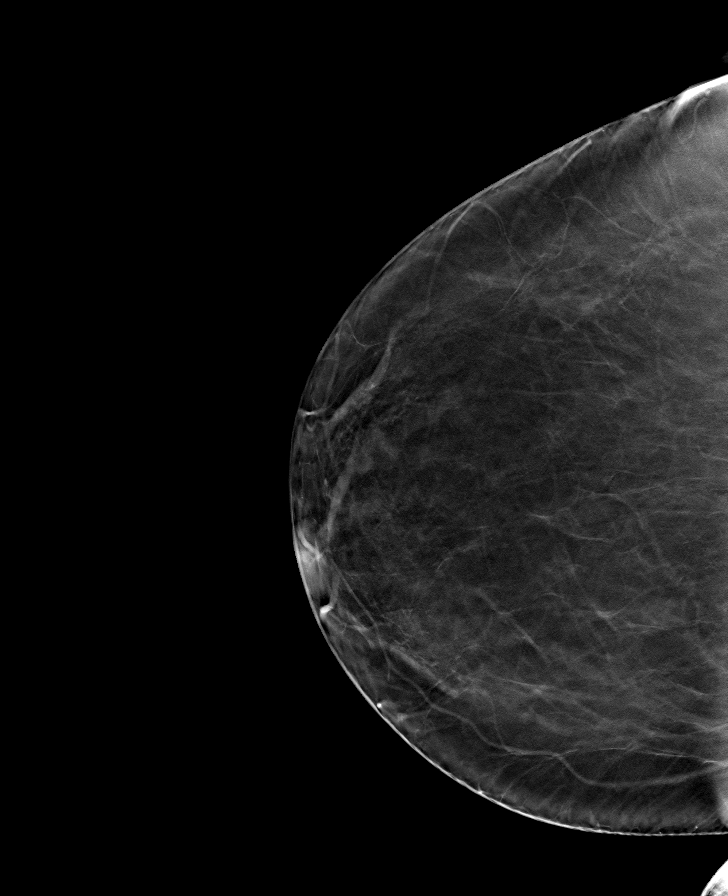

[R MLO tomo · tomo slice 48/95.0]
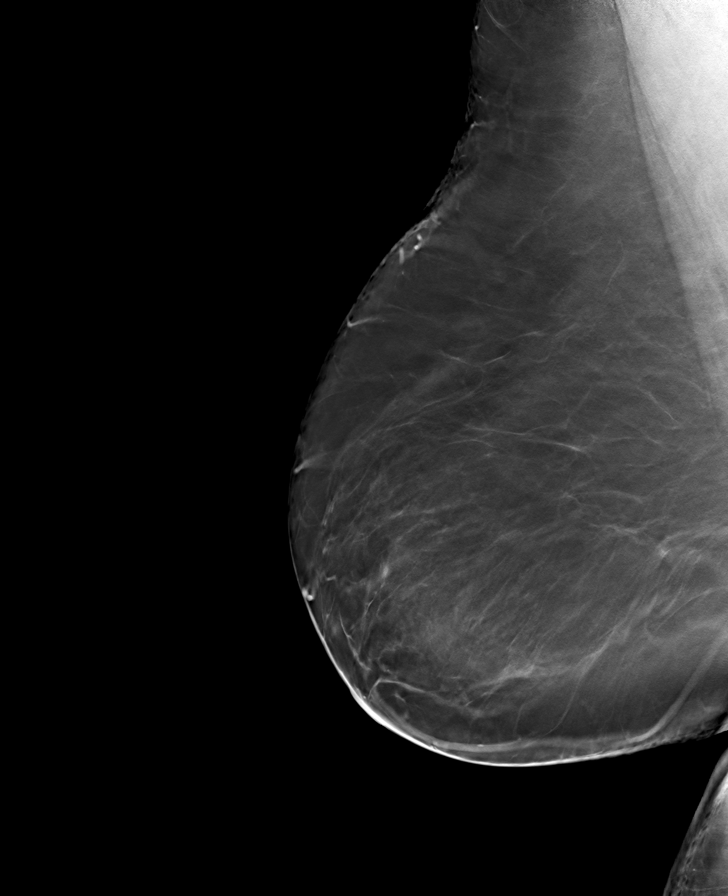

[8 of 24 positions shown; findings below may reference images not displayed]

ACR Breast Density Category b: There are scattered areas of
fibroglandular density.
FINDINGS: There are no findings suspicious for malignancy. Images were
processed with CAD.
IMPRESSION: No mammographic evidence of malignancy. A result letter of this
screening mammogram will be mailed directly to the patient.

RECOMMENDATION:
Screening mammogram in one year. (Code:CN-U-775)

BI-RADS CATEGORY  1: Negative.

## 2020-11-15 ENCOUNTER — Encounter: Payer: Self-pay | Admitting: Nurse Practitioner

## 2020-11-15 ENCOUNTER — Other Ambulatory Visit: Payer: Self-pay

## 2020-11-15 ENCOUNTER — Other Ambulatory Visit: Payer: Self-pay | Admitting: Nurse Practitioner

## 2020-11-15 ENCOUNTER — Ambulatory Visit: Payer: Self-pay | Admitting: Nurse Practitioner

## 2020-11-15 VITALS — BP 148/82 | HR 106 | Temp 97.3°F | Resp 16 | Ht 65.0 in | Wt 321.8 lb

## 2020-11-15 DIAGNOSIS — T7840XA Allergy, unspecified, initial encounter: Secondary | ICD-10-CM

## 2020-11-15 DIAGNOSIS — L239 Allergic contact dermatitis, unspecified cause: Secondary | ICD-10-CM | POA: Diagnosis not present

## 2020-11-15 DIAGNOSIS — L299 Pruritus, unspecified: Secondary | ICD-10-CM

## 2020-11-15 MED ORDER — TRIAMCINOLONE ACETONIDE 0.025 % EX CREA: 1.0000 "application " | TOPICAL_CREAM | Freq: Two times a day (BID) | CUTANEOUS | 2 refills | Status: DC

## 2020-11-15 MED ORDER — PREDNISONE 10 MG (21) PO TBPK: ORAL_TABLET | ORAL | 0 refills | Status: DC

## 2020-11-15 MED ORDER — HYDROXYZINE HCL 10 MG PO TABS: 10.0000 mg | ORAL_TABLET | Freq: Three times a day (TID) | ORAL | 0 refills | Status: DC | PRN

## 2020-11-15 NOTE — Progress Notes (Signed)
[facility] [place], [place] [address]  Internal MEDICINE  Office Visit Note  Patient Name: Stephanie Gates  [id number]  [id number]  Date of Service: 12/16/2020   Pt is here for a sick visit.  Chief Complaint  Patient presents with  . Acute Visit    swollen lips, hands, itching, started yesterday evening  . Leg Swelling    both sides  . Arm Swelling    both side  . Hypertension  . Anemia  . policy update form    received  . Quality Metric Gaps    colonoscopy     Patient presents for sick visit.  -allergic reaction - unsure of cause. Generalized pruritis. Swelling of lips. Denies swelling of the throat or difficulty breathing. No discrete rash. Has taken OTC benadryl without good response -started eating pecans a few days ago -got COVID 19 booster this past Saturday        Current Medication:  Outpatient Encounter Medications as of 11/15/2020  Medication Sig  . celecoxib (CELEBREX) 200 MG capsule Take 1 capsule (200 mg total) by mouth daily.  . Cyanocobalamin (VITAMIN B-12 PO) Take by mouth.  . cyclobenzaprine (FLEXERIL) 10 MG tablet TAKE ONE TAB PO QHS FOR NECK PAIN PRN  . hydrochlorothiazide (MICROZIDE) 12.5 MG capsule Take 1 capsule (12.5 mg total) by mouth daily. PM  . Multiple Vitamin (MULTIVITAMIN) capsule Take 1 capsule by mouth daily.  . pantoprazole (PROTONIX) 40 MG tablet TAKE 1 TABLET(40 MG) BY MOUTH DAILY  . traMADol (ULTRAM) 50 MG tablet Take 1 tablet (50 mg total) by mouth every 6 (six) hours as needed.  . [DISCONTINUED] buPROPion (WELLBUTRIN XL) 150 MG 24 hr tablet Take 1 tablet (150 mg total) by mouth daily.  . hydrOXYzine (ATARAX/VISTARIL) 10 MG tablet Take 1 tablet (10 mg total) by mouth 3 (three) times daily as needed.  . predniSONE (STERAPRED UNI-PAK 21 TAB) 10 MG (21) TBPK tablet 6 day taper - take by mouth as directed for 6 days  . triamcinolone (KENALOG) 0.025 % cream Apply 1 application topically 2 (two) times  daily.   No facility-administered encounter medications on file as of 11/15/2020.      Medical History: Past Medical History:  Diagnosis Date  . Allergy    seasonal  . Anemia   . Arthritis    right hip  . Headache    sinus/allergies  . Hypertension   . Seasonal allergies   . Vertigo    1-2x/month  . Wears contact lenses      Today's Vitals   11/15/20 1058  BP: (!) 148/82  Pulse: (!) 106  Resp: 16  Temp: (!) 97.3 F (36.3 C)  SpO2: 97%  Weight: (!) 321 lb 12.8 oz (146 kg)  Height: 5\' 5"  (1.651 m)   Body mass index is 53.55 kg/m.  Review of Systems  Constitutional: Negative for activity change, chills, fatigue and unexpected weight change.  HENT: Positive for facial swelling and sneezing. Negative for congestion, postnasal drip, rhinorrhea and sore throat.        Swelling of lips and face.   Eyes: Positive for itching.  Respiratory: Negative for cough, chest tightness, shortness of breath and wheezing.   Cardiovascular: Negative for chest pain and palpitations.  Gastrointestinal: Negative for abdominal pain, constipation, diarrhea, nausea and vomiting.  Musculoskeletal: Negative for arthralgias, back pain, joint swelling and neck pain.  Skin: Negative for rash.       Generalized itchiness  Allergic/Immunologic: Positive for  food allergies.  Neurological: Negative for dizziness, tremors, numbness and headaches.  Hematological: Negative for adenopathy. Does not bruise/bleed easily.  Psychiatric/Behavioral: Negative for behavioral problems (Depression), sleep disturbance and suicidal ideas. The patient is not nervous/anxious.     Physical Exam Vitals and nursing note reviewed.  Constitutional:      General: She is not in acute distress.    Appearance: Normal appearance. She is well-developed and well-nourished. She is not diaphoretic.  HENT:     Head: Normocephalic and atraumatic.     Mouth/Throat:     Mouth: Oropharynx is clear and moist.     Pharynx: No  oropharyngeal exudate.     Comments: Swelling of the liips Eyes:     Extraocular Movements: EOM normal.     Pupils: Pupils are equal, round, and reactive to light.  Neck:     Thyroid: No thyromegaly.     Vascular: No JVD.     Trachea: No tracheal deviation.  Cardiovascular:     Rate and Rhythm: Normal rate and regular rhythm.     Heart sounds: Normal heart sounds. No murmur heard. No friction rub. No gallop.   Pulmonary:     Effort: Pulmonary effort is normal. No respiratory distress.     Breath sounds: Normal breath sounds. No wheezing or rales.  Chest:     Chest wall: No tenderness.  Abdominal:     Palpations: Abdomen is soft.  Musculoskeletal:        General: Normal range of motion.     Cervical back: Normal range of motion and neck supple.  Lymphadenopathy:     Cervical: No cervical adenopathy.  Skin:    General: Skin is warm and dry.     Comments: Generalized pruritis with no discrete rash.   Neurological:     Mental Status: She is alert and oriented to person, place, and time.     Cranial Nerves: No cranial nerve deficit.  Psychiatric:        Mood and Affect: Mood and affect normal.        Behavior: Behavior normal.        Thought Content: Thought content normal.        Judgment: Judgment normal.   Assessment/Plan: 1. Allergic reaction, initial encounter Unclear cause. Will check basic food and environmental allergen profiles.   2. Pruritus Start prednisone taper. Take as directed for 6 days. Add hydroxyzine 10mg  up to three times daily if needed for itching. Advised her to avoid driving or working after taking this medication as it may cause dizziness and drowsiness.  - predniSONE (STERAPRED UNI-PAK 21 TAB) 10 MG (21) TBPK tablet; 6 day taper - take by mouth as directed for 6 days  Dispense: 21 tablet; Refill: 0 - hydrOXYzine (ATARAX/VISTARIL) 10 MG tablet; Take 1 tablet (10 mg total) by mouth 3 (three) times daily as needed.  Dispense: 30 tablet; Refill: 0  3.  Allergic dermatitis Prednisone taper. Take as directed for 6 days. Use triamcinolone cream on effected areas twice daily as needed for itching.  - predniSONE (STERAPRED UNI-PAK 21 TAB) 10 MG (21) TBPK tablet; 6 day taper - take by mouth as directed for 6 days  Dispense: 21 tablet; Refill: 0 - triamcinolone (KENALOG) 0.025 % cream; Apply 1 application topically 2 (two) times daily.  Dispense: 80 g; Refill: 2  General Counseling: Cristen verbalizes understanding of the findings of todays visit and agrees with plan of treatment. I have discussed any further diagnostic evaluation that may  be needed or ordered today. We also reviewed her medications today. she has been encouraged to call the office with any questions or concerns that should arise related to todays visit.    Counseling:   This patient was seen by [place] with Dr Lavera Guise as a part of collaborative care agreement  Meds ordered this encounter  Medications  . predniSONE (STERAPRED UNI-PAK 21 TAB) 10 MG (21) TBPK tablet    Sig: 6 day taper - take by mouth as directed for 6 days    Dispense:  21 tablet    Refill:  0    Order Specific Question:   Supervising Provider    Answer:   Lavera Guise [place]  . triamcinolone (KENALOG) 0.025 % cream    Sig: Apply 1 application topically 2 (two) times daily.    Dispense:  80 g    Refill:  2    Order Specific Question:   Supervising Provider    Answer:   Lavera Guise [[id number]]  . hydrOXYzine (ATARAX/VISTARIL) 10 MG tablet    Sig: Take 1 tablet (10 mg total) by mouth 3 (three) times daily as needed.    Dispense:  30 tablet    Refill:  0    Order Specific Question:   Supervising Provider    Answer:   Lavera Guise [[id number]]    Time spent: 25 Minutes

## 2020-11-18 LAB — ALLERGENS W/COMP RFLX AREA 2
Alternaria Alternata IgE: <0.1 kU/L
Aspergillus Fumigatus IgE: <0.1 kU/L
Bermuda Grass IgE: <0.1 kU/L
Cedar, Mountain IgE: <0.1 kU/L
Cladosporium Herbarum IgE: <0.1 kU/L
Cockroach, German IgE: <0.1 kU/L
Common Silver Birch IgE: <0.1 kU/L
Cottonwood IgE: <0.1 kU/L
D Farinae IgE: <0.1 kU/L
D Pteronyssinus IgE: <0.1 kU/L
E001-IgE Cat Dander: <0.1 kU/L
E005-IgE Dog Dander: <0.1 kU/L
Elm, American IgE: <0.1 kU/L
IgE (Immunoglobulin E), Serum: 366 IU/mL (ref 6–495)
Johnson Grass IgE: <0.1 kU/L
Maple/Box Elder IgE: <0.1 kU/L
Mouse Urine IgE: <0.1 kU/L
Oak, White IgE: <0.1 kU/L
Pecan, Hickory IgE: <0.1 kU/L
Penicillium Chrysogen IgE: <0.1 kU/L
Pigweed, Rough IgE: <0.1 kU/L
Ragweed, Short IgE: <0.1 kU/L
Sheep Sorrel IgE Qn: <0.1 kU/L
Timothy Grass IgE: <0.1 kU/L
White Mulberry IgE: <0.1 kU/L

## 2020-11-18 LAB — ALLERGEN PROFILE, BASIC
Codfish IgE: <0.1 kU/L
Egg White IgE: 0.12 kU/L — AB
Milk IgE: 0.11 kU/L — AB
Soybean IgE: <0.1 kU/L
Wheat IgE: <0.1 kU/L

## 2020-11-18 LAB — ALLERGENS(7)
Brazil Nut IgE: <0.1 kU/L
F020-IgE Almond: <0.1 kU/L
F202-IgE Cashew Nut: <0.1 kU/L
Hazelnut (Filbert) IgE: <0.1 kU/L
Peanut IgE: <0.1 kU/L
Pecan Nut IgE: <0.1 kU/L
Walnut IgE: <0.1 kU/L

## 2020-11-23 ENCOUNTER — Other Ambulatory Visit: Payer: Self-pay

## 2020-11-23 ENCOUNTER — Ambulatory Visit: Payer: Self-pay | Admitting: Nurse Practitioner

## 2020-11-23 ENCOUNTER — Encounter: Payer: Self-pay | Admitting: Nurse Practitioner

## 2020-11-23 VITALS — BP 144/79 | HR 100 | Temp 97.2°F | Resp 16 | Ht 65.0 in | Wt 322.0 lb

## 2020-11-23 DIAGNOSIS — I10 Essential (primary) hypertension: Secondary | ICD-10-CM

## 2020-11-23 MED ORDER — TOPIRAMATE 15 MG PO CPSP: 15.0000 mg | ORAL_CAPSULE | Freq: Every evening | ORAL | 1 refills | Status: DC | PRN

## 2020-11-23 MED ORDER — BUPROPION HCL ER (XL) 150 MG PO TB24: 150.0000 mg | ORAL_TABLET | Freq: Every day | ORAL | 2 refills | Status: DC

## 2020-11-23 NOTE — Progress Notes (Signed)
[facility] [place], [place] [address]  Internal MEDICINE  Office Visit Note  Patient Name: Stephanie Gates  [id number]  [id number]  Date of Service: 12/25/2020  Chief Complaint  Patient presents with  . Follow-up    Review results  . Hypertension  . Anemia  . Quality Metric Gaps    colonoscopy    The patient is here for follow up visit. She is trying to lose weight. She has maintained a 1200/1500 calorie diet and is gradually increasing her activity levels. Has has lost two pounds in recent months, but is struggling to maintain this. Her blood pressure does remain high/normal. Stimulant appetite suppressants may not be the best choice for her. Discussed several options to help with weight management.  She did have allergen profile done since her last visit. She does have mild allergy to egg white and milk.       Current Medication: Outpatient Encounter Medications as of 11/23/2020  Medication Sig  . celecoxib (CELEBREX) 200 MG capsule Take 1 capsule (200 mg total) by mouth daily.  . Cyanocobalamin (VITAMIN B-12 PO) Take by mouth.  . cyclobenzaprine (FLEXERIL) 10 MG tablet TAKE ONE TAB PO QHS FOR NECK PAIN PRN  . hydrochlorothiazide (MICROZIDE) 12.5 MG capsule Take 1 capsule (12.5 mg total) by mouth daily. PM  . hydrOXYzine (ATARAX/VISTARIL) 10 MG tablet Take 1 tablet (10 mg total) by mouth 3 (three) times daily as needed.  . Multiple Vitamin (MULTIVITAMIN) capsule Take 1 capsule by mouth daily.  . pantoprazole (PROTONIX) 40 MG tablet TAKE 1 TABLET(40 MG) BY MOUTH DAILY  . predniSONE (STERAPRED UNI-PAK 21 TAB) 10 MG (21) TBPK tablet 6 day taper - take by mouth as directed for 6 days  . traMADol (ULTRAM) 50 MG tablet Take 1 tablet (50 mg total) by mouth every 6 (six) hours as needed.  . triamcinolone (KENALOG) 0.025 % cream Apply 1 application topically 2 (two) times daily.  . [DISCONTINUED] buPROPion (WELLBUTRIN XL) 150 MG 24 hr tablet Take 1  tablet (150 mg total) by mouth daily.  Marland Kitchen buPROPion (WELLBUTRIN XL) 150 MG 24 hr tablet Take 1 tablet (150 mg total) by mouth daily.  . [DISCONTINUED] topiramate (TOPAMAX) 15 MG capsule Take 1 capsule (15 mg total) by mouth at bedtime as needed.   No facility-administered encounter medications on file as of 11/23/2020.    Surgical History: Past Surgical History:  Procedure Laterality Date  . COLONOSCOPY N/A 05/03/2015   Procedure: COLONOSCOPY;  Surgeon: Lucilla Lame, MD;  Location: [facility];  Service: Gastroenterology;  Laterality: N/A;  . ESOPHAGOGASTRODUODENOSCOPY N/A 05/03/2015   Procedure: ESOPHAGOGASTRODUODENOSCOPY (EGD);  Surgeon: Lucilla Lame, MD;  Location: [facility];  Service: Gastroenterology;  Laterality: N/A;  . FOOT SURGERY  2014  . Gastric bypass surgery    . POLYPECTOMY  05/03/2015   Procedure: POLYPECTOMY INTESTINAL;  Surgeon: Lucilla Lame, MD;  Location: [facility];  Service: Gastroenterology;;    Medical History: Past Medical History:  Diagnosis Date  . Allergy    seasonal  . Anemia   . Arthritis    right hip  . Headache    sinus/allergies  . Hypertension   . Seasonal allergies   . Vertigo    1-2x/month  . Wears contact lenses     Family History: Family History  Problem Relation Age of Onset  . Diabetes Mother   . Hypertension Mother   . Cancer Father   . Diabetes Father   . Hypertension  Father     Social History   Socioeconomic History  . Marital status: Single    Spouse name: Not on file  . Number of children: Not on file  . Years of education: Not on file  . Highest education level: Not on file  Occupational History  . Not on file  Tobacco Use  . Smoking status: Never Smoker  . Smokeless tobacco: Never Used  Substance and Sexual Activity  . Alcohol use: No  . Drug use: No  . Sexual activity: Not on file  Other Topics Concern  . Not on file  Social History Narrative  . Not on file   Social Determinants of  Health   Financial Resource Strain: Not on file  Food Insecurity: Not on file  Transportation Needs: Not on file  Physical Activity: Not on file  Stress: Not on file  Social Connections: Not on file  Intimate Partner Violence: Not on file      Review of Systems  Constitutional: Negative for activity change, chills, fatigue and unexpected weight change.       Two pound weight loss since her last visit.   HENT: Negative for congestion, postnasal drip, rhinorrhea, sneezing and sore throat.   Respiratory: Negative for cough, chest tightness, shortness of breath and wheezing.   Cardiovascular: Negative for chest pain and palpitations.       Blood pressure is high/normal.   Gastrointestinal: Negative for abdominal pain, constipation, diarrhea, nausea and vomiting.  Endocrine: Negative for cold intolerance, heat intolerance, polydipsia and polyuria.  Musculoskeletal: Negative for arthralgias, back pain, joint swelling and neck pain.  Skin: Negative for rash.  Allergic/Immunologic: Positive for food allergies.  Neurological: Negative for dizziness, tremors, numbness and headaches.  Hematological: Negative for adenopathy. Does not bruise/bleed easily.  Psychiatric/Behavioral: Negative for behavioral problems (Depression), sleep disturbance and suicidal ideas. The patient is nervous/anxious.     Today's Vitals   11/23/20 1601  BP: (!) 144/79  Pulse: 100  Resp: 16  Temp: (!) 97.2 F (36.2 C)  SpO2: 98%  Weight: (!) 322 lb (146.1 kg)  Height: 5\' 5"  (1.651 m)   Body mass index is 53.58 kg/m.  Physical Exam Vitals and nursing note reviewed.  Constitutional:      General: She is not in acute distress.    Appearance: Normal appearance. She is well-developed and well-nourished. She is obese. She is not diaphoretic.  HENT:     Head: Normocephalic and atraumatic.     Nose: Nose normal.     Mouth/Throat:     Mouth: Oropharynx is clear and moist.     Pharynx: No oropharyngeal  exudate.  Eyes:     Extraocular Movements: EOM normal.     Pupils: Pupils are equal, round, and reactive to light.  Neck:     Thyroid: No thyromegaly.     Vascular: No carotid bruit or JVD.     Trachea: No tracheal deviation.  Cardiovascular:     Rate and Rhythm: Normal rate and regular rhythm.     Heart sounds: Normal heart sounds. No murmur heard. No friction rub. No gallop.   Pulmonary:     Effort: Pulmonary effort is normal. No respiratory distress.     Breath sounds: Normal breath sounds. No wheezing or rales.  Chest:     Chest wall: No tenderness.  Abdominal:     Palpations: Abdomen is soft.  Musculoskeletal:        General: Normal range of motion.  Cervical back: Normal range of motion and neck supple.  Lymphadenopathy:     Cervical: No cervical adenopathy.  Skin:    General: Skin is warm and dry.  Neurological:     General: No focal deficit present.     Mental Status: She is alert and oriented to person, place, and time.     Cranial Nerves: No cranial nerve deficit.  Psychiatric:        Mood and Affect: Mood and affect and mood normal.        Behavior: Behavior normal.        Thought Content: Thought content normal.        Judgment: Judgment normal.    Assessment/Plan: 1. Essential (primary) hypertension Blood pressure stable. Continue medication as prescribed.   2. Morbid (severe) obesity due to excess calories ([place]) Continue wellbutrin XL 150mg  daily. Trial of topamax 15mg  every evening to help reduce appetite. She should continue to maintain 1200-1500 calories per day and gradually incorporate exercise into her daliy routine.  - buPROPion (WELLBUTRIN XL) 150 MG 24 hr tablet; Take 1 tablet (150 mg total) by mouth daily.  Dispense: 30 tablet; Refill: 2  General Counseling: Marley verbalizes understanding of the findings of todays visit and agrees with plan of treatment. I have discussed any further diagnostic evaluation that may be needed or ordered today. We  also reviewed her medications today. she has been encouraged to call the office with any questions or concerns that should arise related to todays visit.    There is a liability release in patients' chart. There has been a 10 minute discussion about the side effects including but not limited to elevated blood pressure, anxiety, lack of sleep and dry mouth. Pt understands and will like to start/continue on appetite suppressant at this time. There will be one month RX given at the time of visit with proper follow up. Nova diet plan with restricted calories is given to the pt. Pt understands and agrees with  plan of treatment  This patient was seen by Leretha Pol FNP Collaboration with Dr Lavera Guise as a part of collaborative care agreement  Meds ordered this encounter  Medications  . buPROPion (WELLBUTRIN XL) 150 MG 24 hr tablet    Sig: Take 1 tablet (150 mg total) by mouth daily.    Dispense:  30 tablet    Refill:  2    Order Specific Question:   Supervising Provider    Answer:   Lavera Guise [[id number]]  . DISCONTD: topiramate (TOPAMAX) 15 MG capsule    Sig: Take 1 capsule (15 mg total) by mouth at bedtime as needed.    Dispense:  30 capsule    Refill:  1    Order Specific Question:   Supervising Provider    Answer:   Lavera Guise [[id number]]    Total time spent: 25 Minutes   Time spent includes review of chart, medications, test results, and follow up plan with the patient.      Dr Lavera Guise Internal medicine

## 2020-11-24 ENCOUNTER — Other Ambulatory Visit: Payer: Self-pay

## 2020-11-24 MED ORDER — TOPIRAMATE 15 MG PO CPSP: 15.0000 mg | ORAL_CAPSULE | Freq: Every evening | ORAL | 1 refills | Status: DC | PRN

## 2020-12-16 DIAGNOSIS — T7840XA Allergy, unspecified, initial encounter: Secondary | ICD-10-CM | POA: Insufficient documentation

## 2020-12-16 DIAGNOSIS — L299 Pruritus, unspecified: Secondary | ICD-10-CM | POA: Insufficient documentation

## 2020-12-16 DIAGNOSIS — L239 Allergic contact dermatitis, unspecified cause: Secondary | ICD-10-CM | POA: Insufficient documentation

## 2020-12-16 DIAGNOSIS — L2389 Allergic contact dermatitis due to other agents: Secondary | ICD-10-CM | POA: Insufficient documentation

## 2020-12-21 ENCOUNTER — Ambulatory Visit: Payer: Managed Care, Other (non HMO) | Admitting: Nurse Practitioner

## 2021-01-10 ENCOUNTER — Ambulatory Visit: Payer: Managed Care, Other (non HMO) | Admitting: Hospice and Palliative Medicine

## 2021-01-10 ENCOUNTER — Encounter: Payer: Self-pay | Admitting: Hospice and Palliative Medicine

## 2021-01-10 ENCOUNTER — Other Ambulatory Visit: Payer: Self-pay

## 2021-01-10 VITALS — BP 132/70 | HR 84 | Temp 97.8°F | Resp 16 | Ht 65.0 in | Wt 319.0 lb

## 2021-01-10 DIAGNOSIS — I10 Essential (primary) hypertension: Secondary | ICD-10-CM | POA: Diagnosis not present

## 2021-01-10 DIAGNOSIS — Z1211 Encounter for screening for malignant neoplasm of colon: Secondary | ICD-10-CM | POA: Diagnosis not present

## 2021-01-10 DIAGNOSIS — Z6841 Body Mass Index (BMI) 40.0 and over, adult: Secondary | ICD-10-CM | POA: Diagnosis not present

## 2021-01-10 MED ORDER — BUPROPION HCL ER (XL) 150 MG PO TB24: ORAL_TABLET | ORAL | 2 refills | Status: DC

## 2021-01-10 NOTE — Progress Notes (Signed)
[facility] [place], [place] [address]  Internal MEDICINE  Office Visit Note  Patient Name: Stephanie Gates  [id number]  [id number]  Date of Service: 01/13/2021  Chief Complaint  Patient presents with  . Medical Management of Chronic Issues    Weight managment  . Hypertension  . Anxiety    HPI Patient is here for routine follow-up Followed for weight loss At last visit trial of Topamax was added to Wellbutrin to optimize weight loss therapy Started having bilateral leg cramping when she started Topamax--has since stopped taking medication and cramping has resolved  Has had a 3 pound weight loss since last office visit She has been counting her calories and trying to be more active  BP--better controlled today, reports she has been taking HCTZ daily as prescribed  Missed call to schedule colonoscopy that needs to be repeated this year per Dr. Allen Norris  Current Medication: Outpatient Encounter Medications as of 01/10/2021  Medication Sig  . celecoxib (CELEBREX) 200 MG capsule Take 1 capsule (200 mg total) by mouth daily.  . Cyanocobalamin (VITAMIN B-12 PO) Take by mouth.  . cyclobenzaprine (FLEXERIL) 10 MG tablet TAKE ONE TAB PO QHS FOR NECK PAIN PRN  . hydrochlorothiazide (MICROZIDE) 12.5 MG capsule Take 1 capsule (12.5 mg total) by mouth daily. PM  . hydrOXYzine (ATARAX/VISTARIL) 10 MG tablet Take 1 tablet (10 mg total) by mouth 3 (three) times daily as needed.  . Multiple Vitamin (MULTIVITAMIN) capsule Take 1 capsule by mouth daily.  . pantoprazole (PROTONIX) 40 MG tablet TAKE 1 TABLET(40 MG) BY MOUTH DAILY  . topiramate (TOPAMAX) 15 MG capsule Take 1 capsule (15 mg total) by mouth at bedtime as needed.  . [DISCONTINUED] buPROPion (WELLBUTRIN XL) 150 MG 24 hr tablet Take 1 tablet (150 mg total) by mouth daily.  . [DISCONTINUED] predniSONE (STERAPRED UNI-PAK 21 TAB) 10 MG (21) TBPK tablet 6 day taper - take by mouth as directed for 6 days  .  buPROPion (WELLBUTRIN XL) 150 MG 24 hr tablet Take one tablet at 8AM and take one tablet at 2pm daily.  . [DISCONTINUED] traMADol (ULTRAM) 50 MG tablet Take 1 tablet (50 mg total) by mouth every 6 (six) hours as needed. (Patient not taking: Reported on 01/10/2021)  . [DISCONTINUED] triamcinolone (KENALOG) 0.025 % cream Apply 1 application topically 2 (two) times daily.   No facility-administered encounter medications on file as of 01/10/2021.    Surgical History: Past Surgical History:  Procedure Laterality Date  . COLONOSCOPY N/A 05/03/2015   Procedure: COLONOSCOPY;  Surgeon: Lucilla Lame, MD;  Location: [place];  Service: Gastroenterology;  Laterality: N/A;  . ESOPHAGOGASTRODUODENOSCOPY N/A 05/03/2015   Procedure: ESOPHAGOGASTRODUODENOSCOPY (EGD);  Surgeon: Lucilla Lame, MD;  Location: [place];  Service: Gastroenterology;  Laterality: N/A;  . FOOT SURGERY  2014  . Gastric bypass surgery    . POLYPECTOMY  05/03/2015   Procedure: POLYPECTOMY INTESTINAL;  Surgeon: Lucilla Lame, MD;  Location: [place];  Service: Gastroenterology;;    Medical History: Past Medical History:  Diagnosis Date  . Allergy    seasonal  . Anemia   . Arthritis    right hip  . Headache    sinus/allergies  . Hypertension   . Seasonal allergies   . Vertigo    1-2x/month  . Wears contact lenses     Family History: Family History  Problem Relation Age of Onset  . Diabetes Mother   . Hypertension Mother   . Cancer Father   .  Diabetes Father   . Hypertension Father     Social History   Socioeconomic History  . Marital status: Single    Spouse name: Not on file  . Number of children: Not on file  . Years of education: Not on file  . Highest education level: Not on file  Occupational History  . Not on file  Tobacco Use  . Smoking status: Never Smoker  . Smokeless tobacco: Never Used  Substance and Sexual Activity  . Alcohol use: No  . Drug use: No  . Sexual  activity: Not on file  Other Topics Concern  . Not on file  Social History Narrative  . Not on file   Social Determinants of Health   Financial Resource Strain: Not on file  Food Insecurity: Not on file  Transportation Needs: Not on file  Physical Activity: Not on file  Stress: Not on file  Social Connections: Not on file  Intimate Partner Violence: Not on file      Review of Systems  Constitutional: Negative for chills, diaphoresis and fatigue.  HENT: Negative for ear pain, postnasal drip and sinus pressure.   Eyes: Negative for photophobia, discharge, redness, itching and visual disturbance.  Respiratory: Negative for cough, shortness of breath and wheezing.   Cardiovascular: Negative for chest pain, palpitations and leg swelling.  Gastrointestinal: Negative for abdominal pain, constipation, diarrhea, nausea and vomiting.  Genitourinary: Negative for dysuria and flank pain.  Musculoskeletal: Negative for arthralgias, back pain, gait problem and neck pain.  Skin: Negative for color change.  Allergic/Immunologic: Negative for environmental allergies and food allergies.  Neurological: Negative for dizziness and headaches.  Hematological: Does not bruise/bleed easily.  Psychiatric/Behavioral: Negative for agitation, behavioral problems (depression) and hallucinations.    Vital Signs: BP 132/70   Pulse 84   Temp 97.8 F (36.6 C)   Resp 16   Ht 5\' 5"  (1.651 m)   Wt (!) 319 lb (144.7 kg)   LMP 02/11/2017   SpO2 99%   BMI 53.08 kg/m    Physical Exam Vitals reviewed.  Constitutional:      Appearance: She is obese.  Cardiovascular:     Rate and Rhythm: Normal rate and regular rhythm.     Pulses: Normal pulses.     Heart sounds: Normal heart sounds.  Pulmonary:     Effort: Pulmonary effort is normal.     Breath sounds: Normal breath sounds.  Abdominal:     General: Abdomen is flat.     Palpations: Abdomen is soft.  Musculoskeletal:        General: Normal range  of motion.     Cervical back: Normal range of motion.  Skin:    General: Skin is warm.  Neurological:     General: No focal deficit present.     Mental Status: She is alert and oriented to person, place, and time. Mental status is at baseline.  Psychiatric:        Mood and Affect: Mood normal.        Behavior: Behavior normal.        Thought Content: Thought content normal.        Judgment: Judgment normal.    Assessment/Plan: 1. Essential (primary) hypertension Advised to continue with daily HCTZ--BP and HR well controlled today as she has been adhering to therapy Continue to monitor  2. Morbid (severe) obesity due to excess calories (HCC) BMI 53 Avoid Topamax due to negative side effects of cramping Add second dose of Wellbutrin  midday to optimize weight loss therapy - buPROPion (WELLBUTRIN XL) 150 MG 24 hr tablet; Take one tablet at 8AM and take one tablet at 2pm daily.  Dispense: 60 tablet; Refill: 2  3. Adult BMI 50.0-59.9 kg/sq m ([place]) Comorbid conditions include HTN  4. Screening for colon cancer Will need to contact GI office to schedule colonoscopy Referral placed and active from previous office visit  General Counseling: ethelda dulworth understanding of the findings of todays visit and agrees with plan of treatment. I have discussed any further diagnostic evaluation that may be needed or ordered today. We also reviewed her medications today. she has been encouraged to call the office with any questions or concerns that should arise related to todays visit.  Obesity Counseling: Risk Assessment: An assessment of behavioral risk factors was made today and includes lack of exercise sedentary lifestyle, lack of portion control and poor dietary habits.  Risk Modification Advice: She was counseled on portion control guidelines. Restricting daily caloric intake to 1800. The detrimental long term effects of obesity on her health and ongoing poor compliance was also discussed  with the patient.  Meds ordered this encounter  Medications  . buPROPion (WELLBUTRIN XL) 150 MG 24 hr tablet    Sig: Take one tablet at 8AM and take one tablet at 2pm daily.    Dispense:  60 tablet    Refill:  2    Time spent: 30 Minutes Time spent includes review of chart, medications, test results and follow-up plan with the patient.  This patient was seen by Theodoro Grist AGNP-C in Collaboration with Dr Lavera Guise as a part of collaborative care agreement     Tanna Furry. Annalysia Willenbring AGNP-C Internal medicine

## 2021-01-11 ENCOUNTER — Other Ambulatory Visit: Payer: Self-pay | Admitting: Hospice and Palliative Medicine

## 2021-01-11 ENCOUNTER — Telehealth: Payer: Self-pay

## 2021-01-11 DIAGNOSIS — N950 Postmenopausal bleeding: Secondary | ICD-10-CM

## 2021-01-11 NOTE — Telephone Encounter (Signed)
Pt called that she having vaginal discharge her last menstrual cycle was 2 years ago as per taylor we order transvaginal U/S beth will call her with appt date

## 2021-01-11 NOTE — Progress Notes (Signed)
Episode of vaginal bleeding last called, post-menopausal.

## 2021-01-12 ENCOUNTER — Telehealth: Payer: Self-pay

## 2021-01-12 NOTE — Telephone Encounter (Signed)
Left message advising pt of ultrasound appointment. Stephanie Gates

## 2021-01-12 NOTE — Telephone Encounter (Signed)
-----   Message from Corlis Hove sent at 01/11/2021  3:14 PM EST ----- Pt need U/s order is already in epic

## 2021-01-13 ENCOUNTER — Encounter: Payer: Self-pay | Admitting: Hospice and Palliative Medicine

## 2021-01-17 ENCOUNTER — Other Ambulatory Visit: Payer: Self-pay

## 2021-01-17 ENCOUNTER — Ambulatory Visit
Admission: RE | Admit: 2021-01-17 | Discharge: 2021-01-17 | Disposition: A | Discharge status: Home / Self Care | Payer: 59 | Source: Ambulatory Visit | Attending: Hospice and Palliative Medicine | Admitting: Hospice and Palliative Medicine

## 2021-01-17 DIAGNOSIS — N950 Postmenopausal bleeding: Secondary | ICD-10-CM | POA: Diagnosis not present

## 2021-01-17 NOTE — Progress Notes (Signed)
Lmom for patient to return call to discuss results. Will need referral to GYN.

## 2021-01-19 ENCOUNTER — Other Ambulatory Visit: Payer: Self-pay | Admitting: Hospice and Palliative Medicine

## 2021-01-19 DIAGNOSIS — N950 Postmenopausal bleeding: Secondary | ICD-10-CM

## 2021-01-19 DIAGNOSIS — N939 Abnormal uterine and vaginal bleeding, unspecified: Secondary | ICD-10-CM

## 2021-01-19 NOTE — Progress Notes (Signed)
Spoke with patient about results, referral to GYN placed.

## 2021-02-05 ENCOUNTER — Ambulatory Visit (INDEPENDENT_AMBULATORY_CARE_PROVIDER_SITE_OTHER): Payer: 59 | Admitting: Obstetrics and Gynecology

## 2021-02-05 ENCOUNTER — Other Ambulatory Visit (HOSPITAL_COMMUNITY)
Admission: RE | Admit: 2021-02-05 | Discharge: 2021-02-05 | Disposition: A | Discharge status: Home / Self Care | Payer: 59 | Source: Ambulatory Visit | Attending: Obstetrics and Gynecology | Admitting: Obstetrics and Gynecology

## 2021-02-05 ENCOUNTER — Other Ambulatory Visit: Payer: Self-pay

## 2021-02-05 ENCOUNTER — Encounter: Payer: Self-pay | Admitting: Obstetrics and Gynecology

## 2021-02-05 VITALS — BP 140/80 | Ht 64.0 in | Wt 319.0 lb

## 2021-02-05 DIAGNOSIS — N950 Postmenopausal bleeding: Secondary | ICD-10-CM | POA: Insufficient documentation

## 2021-02-05 DIAGNOSIS — Z9189 Other specified personal risk factors, not elsewhere classified: Secondary | ICD-10-CM | POA: Diagnosis not present

## 2021-02-05 DIAGNOSIS — N841 Polyp of cervix uteri: Secondary | ICD-10-CM | POA: Insufficient documentation

## 2021-02-05 NOTE — Progress Notes (Signed)
Patient ID: Stephanie Gates, female   DOB: [birth year], 55 y.o.   MRN: [id number]  Reason for Consult: Gynecologic Exam   Referred by Stephanie Ochoa, NP  Subjective:     HPI:  Stephanie Gates is a 55 y.o. female. She reports that in January of 2022 she has a small amount of vaginal bleeding. It was very light and happened twice after wiping. She has not been having pelvic pain. Recent pelvic US showed fibroid uterus and endometrium measured 6 mm.   Gynecological History Menarche: uncertain Menopause: 48 Last pap smear: 10/07/2018- NIL HPV negative Last Mammogram: 10/20220 BIRADS 1 History of STDs: no Sexually Active: no  Obstetrical History OB History  Gravida Para Term Preterm AB Living  2 1 1   1 1   SAB IAB Ectopic Multiple Live Births  1       1    # Outcome Date GA Lbr Len/2nd Weight Sex Delivery Anes PTL Lv  2 SAB 1995          1 Term 09/25/91    F Vag-Spont   LIV     Past Medical History:  Diagnosis Date  . Allergy    seasonal  . Anemia   . Arthritis    right hip  . Headache    sinus/allergies  . Hypertension   . Seasonal allergies   . Vertigo    1-2x/month  . Wears contact lenses    Family History  Problem Relation Age of Onset  . Diabetes Mother   . Hypertension Mother   . Cancer Father   . Diabetes Father   . Hypertension Father    Past Surgical History:  Procedure Laterality Date  . COLONOSCOPY N/A 05/03/2015   Procedure: COLONOSCOPY;  Surgeon: Lucilla Lame, MD;  Location: [place];  Service: Gastroenterology;  Laterality: N/A;  . ESOPHAGOGASTRODUODENOSCOPY N/A 05/03/2015   Procedure: ESOPHAGOGASTRODUODENOSCOPY (EGD);  Surgeon: Lucilla Lame, MD;  Location: [place];  Service: Gastroenterology;  Laterality: N/A;  . FOOT SURGERY  2014  . Gastric bypass surgery    . POLYPECTOMY  05/03/2015   Procedure: POLYPECTOMY INTESTINAL;  Surgeon: Lucilla Lame, MD;  Location: [place];  Service: Gastroenterology;;     Short Social History:  Social History   Tobacco Use  . Smoking status: Never Smoker  . Smokeless tobacco: Never Used  Substance Use Topics  . Alcohol use: No    Allergies  Allergen Reactions  . Penicillin G Itching  . Penicillins Itching    Current Outpatient Medications  Medication Sig Dispense Refill  . buPROPion (WELLBUTRIN XL) 150 MG 24 hr tablet Take one tablet at 8AM and take one tablet at 2pm daily. 60 tablet 2  . Cyanocobalamin (VITAMIN B-12 PO) Take by mouth.    . cyclobenzaprine (FLEXERIL) 10 MG tablet TAKE ONE TAB PO QHS FOR NECK PAIN PRN 30 tablet 2  . hydrochlorothiazide (MICROZIDE) 12.5 MG capsule Take 1 capsule (12.5 mg total) by mouth daily. PM 90 capsule 3  . Multiple Vitamin (MULTIVITAMIN) capsule Take 1 capsule by mouth daily.    . pantoprazole (PROTONIX) 40 MG tablet TAKE 1 TABLET(40 MG) BY MOUTH DAILY 90 tablet 0  . celecoxib (CELEBREX) 200 MG capsule Take 1 capsule (200 mg total) by mouth daily. (Patient not taking: Reported on 02/05/2021) 90 capsule 1  . hydrOXYzine (ATARAX/VISTARIL) 10 MG tablet Take 1 tablet (10 mg total) by mouth 3 (three) times daily as needed. (Patient not taking: Reported on 02/05/2021)  30 tablet 0  . topiramate (TOPAMAX) 15 MG capsule Take 1 capsule (15 mg total) by mouth at bedtime as needed. (Patient not taking: Reported on 02/05/2021) 90 capsule 1   No current facility-administered medications for this visit.    Review of Systems  Constitutional: Negative for chills, fatigue, fever and unexpected weight change.  HENT: Negative for trouble swallowing.  Eyes: Negative for loss of vision.  Respiratory: Negative for cough, shortness of breath and wheezing.  Cardiovascular: Negative for chest pain, leg swelling, palpitations and syncope.  GI: Negative for abdominal pain, blood in stool, diarrhea, nausea and vomiting.  GU: Negative for difficulty urinating, dysuria, frequency and hematuria.  Musculoskeletal: Negative for back pain,  leg pain and joint pain.  Skin: Negative for rash.  Neurological: Negative for dizziness, headaches, light-headedness, numbness and seizures.  Psychiatric: Negative for behavioral problem, confusion, depressed mood and sleep disturbance.        Objective:  Objective   Vitals:   02/05/21 0835  BP: 140/80  Weight: (!) 319 lb (144.7 kg)  Height: 5\' 4"  (1.626 m)   Body mass index is 54.76 kg/m.  Physical Exam Vitals and nursing note reviewed.  Constitutional:      Appearance: She is well-developed and well-nourished.  HENT:     Head: Normocephalic and atraumatic.  Eyes:     Extraocular Movements: EOM normal.     Pupils: Pupils are equal, round, and reactive to light.  Cardiovascular:     Rate and Rhythm: Normal rate and regular rhythm.  Pulmonary:     Effort: Pulmonary effort is normal. No respiratory distress.  Skin:    General: Skin is warm and dry.  Neurological:     Mental Status: She is alert and oriented to person, place, and time.  Psychiatric:        Mood and Affect: Mood and affect normal.        Behavior: Behavior normal.        Thought Content: Thought content normal.        Judgment: Judgment normal.    Do you snore loudly? ( louder than talking or loud enough to be heard through closed doors?) YES Do you often feel tired, fatigued, or sleepy during daytime? YES Has anyone observed you stop breathing during sleep? NO Do you have or are you being treated for high blood pressure? YES BMI> 35 YES Age> 50 YES Neck circumference > 40 cm Not done Female gender? NO ** if yes to > 3 questions high risk of obstructive sleep apnea ** if yes to <3 questions+ low risk for obstructive sleep apnea   Endometrial Biopsy After discussion with the patient regarding her abnormal uterine bleeding I recommended that she proceed with an endometrial biopsy for further diagnosis. The risks, benefits, alternatives, and indications for an endometrial biopsy were discussed  with the patient in detail. She understood the risks including infection, bleeding, cervical laceration and uterine perforation.  Verbal consent was obtained.   PROCEDURE NOTE:  [place] endometrial biopsy was performed using aseptic technique with iodine preparation.  The uterus was sounded to a length of 9 cm.  Adequate sampling was obtained with minimal blood loss.  Colposcopic biopsy of 12 o'clock cervical polyp performed. The patient tolerated the procedure well.  Disposition will be pending pathology.   Assessment/Plan:     55 yo with postmenopausal bleeding EMB today in office with biopsy of cerivcal polyp. Would suspect bleeding likely related to atrophic vulvar/vaginal tissues. Discussed OTC lubricants to  help with reported discomfort.   Encouraged mammogram Encouraged Sleep apnea screening- referral placed Discussed colonoscopy- patient aware that she needs to schedule this. 6 mm sessile polyp in 2016  More than 45 minutes were spent face to face with the patient in the room, reviewing the medical record, labs and images, and coordinating care for the patient. The plan of management was discussed in detail and counseling was provided.     Adrian Prows MD [facility] 02/05/2021 9:06 AM

## 2021-02-05 NOTE — Patient Instructions (Addendum)
[ ]  Mammogram [ ]  Colonoscopy [ ]  Sleep apnea screening   Postmenopausal Bleeding Postmenopausal bleeding is any bleeding that a woman has after she has entered menopause. Menopause is the end of a woman's fertile years. After menopause, a woman no longer ovulates and does not have menstrual periods. Therefore, she should no longer have bleeding from her vagina. Postmenopausal bleeding may have various causes, including:  Menopausal hormone therapy (MHT).  Endometrial atrophy. After menopause, low estrogen hormone levels cause the membrane that lines the uterus (endometrium) to become thin. You may have bleeding as the endometrium thins.  Endometrial hyperplasia. This condition is caused by excess estrogen hormones and low levels of progesterone hormones. The excess estrogen causes the endometrium to thicken, which can lead to bleeding. In some cases, this can lead to cancer of the uterus.  Endometrial cancer.  Noncancerous growths (polyps) on the endometrium, the lining of the uterus, or the cervix.  Uterine fibroids. These are noncancerous growths in or around the uterus muscle tissue that can cause heavy bleeding. Any type of postmenopausal bleeding, even if it appears to be a typical menstrual period, should be checked by your health care provider. Treatment will depend on the cause of the bleeding. Follow these instructions at home:  Pay attention to any changes in your symptoms. Let your health care provider know about them.  Avoid using tampons and douches as told by your health care provider.  Change your pads regularly.  Get regular pelvic exams, including Pap tests, as told by your health care provider.  Take iron supplements as told by your health care provider.  Take over-the-counter and prescription medicines only as told by your health care provider.  Keep all follow-up visits. This is important.   Contact a health care provider if:  You have new bleeding from  the vagina after menopause.  You have pain in your abdomen. Get help right away if:  You have a fever or chills.  You have severe pain with bleeding.  You are passing blood clots.  You have heavy bleeding, need more than 1 pad an hour, and have never experienced this before.  You have headaches or feel faint or dizzy. Summary  Postmenopausal bleeding is any bleeding that a woman has after she has entered into menopause.  Postmenopausal bleeding may have various causes. Treatment will depend on the cause of the bleeding.  Any type of postmenopausal bleeding, even if it appears to be a typical menstrual period, should be checked by your health care provider.  Be sure to pay attention to any changes in your symptoms and keep all follow-up visits. This information is not intended to replace advice given to you by your health care provider. Make sure you discuss any questions you have with your health care provider. Document Revised: 05/11/2020 Document Reviewed: 05/11/2020 Elsevier Patient Education  2021 [employer].    Vaginal/ Vulvar Moisturizer Use 3-5 times a week at bedtime Hyalo Gyn Revaree Replens Carlson Key-E suppositories Vitamin E oil, olive oil, coconut oil   Water-based Lubricants  Astroglide KY Jelly  Luvena Aquagel  Silicone- based Lubricants Pjur PINK Astroglide silicone Uberlube    Sleep Apnea Sleep apnea is a condition in which breathing pauses or becomes shallow during sleep. Episodes of sleep apnea usually last 10 seconds or longer, and they may occur as many as 20 times an hour. Sleep apnea disrupts your sleep and keeps your body from getting the rest that it needs. This condition can increase  your risk of certain health problems, including:  Heart attack.  Stroke.  Obesity.  Diabetes.  Heart failure.  Irregular heartbeat. What are the causes? There are three kinds of sleep apnea:  Obstructive sleep apnea. This kind is caused by  a blocked or collapsed airway.  Central sleep apnea. This kind happens when the part of the brain that controls breathing does not send the correct signals to the muscles that control breathing.  Mixed sleep apnea. This is a combination of obstructive and central sleep apnea. The most common cause of this condition is a collapsed or blocked airway. An airway can collapse or become blocked if:  Your throat muscles are abnormally relaxed.  Your tongue and tonsils are larger than normal.  You are overweight.  Your airway is smaller than normal.   What increases the risk? You are more likely to develop this condition if you:  Are overweight.  Smoke.  Have a smaller than normal airway.  Are elderly.  Are female.  Drink alcohol.  Take sedatives or tranquilizers.  Have a family history of sleep apnea. What are the signs or symptoms? Symptoms of this condition include:  Trouble staying asleep.  Daytime sleepiness and tiredness.  Irritability.  Loud snoring.  Morning headaches.  Trouble concentrating.  Forgetfulness.  Decreased interest in sex.  Unexplained sleepiness.  Mood swings.  Personality changes.  Feelings of depression.  Waking up often during the night to urinate.  Dry mouth.  Sore throat. How is this diagnosed? This condition may be diagnosed with:  A medical history.  A physical exam.  A series of tests that are done while you are sleeping (sleep study). These tests are usually done in a sleep lab, but they may also be done at home. How is this treated? Treatment for this condition aims to restore normal breathing and to ease symptoms during sleep. It may involve managing health issues that can affect breathing, such as high blood pressure or obesity. Treatment may include:  Sleeping on your side.  Using a decongestant if you have nasal congestion.  Avoiding the use of depressants, including alcohol, sedatives, and narcotics.  Losing  weight if you are overweight.  Making changes to your diet.  Quitting smoking.  Using a device to open your airway while you sleep, such as: ? An oral appliance. This is a custom-made mouthpiece that shifts your lower jaw forward. ? A continuous positive airway pressure (CPAP) device. This device blows air through a mask when you breathe out (exhale). ? A nasal expiratory positive airway pressure (EPAP) device. This device has valves that you put into each nostril. ? A bi-level positive airway pressure (BPAP) device. This device blows air through a mask when you breathe in (inhale) and breathe out (exhale).  Having surgery if other treatments do not work. During surgery, excess tissue is removed to create a wider airway. It is important to get treatment for sleep apnea. Without treatment, this condition can lead to:  High blood pressure.  Coronary artery disease.  In men, an inability to achieve or maintain an erection (impotence).  Reduced thinking abilities.   Follow these instructions at home: Lifestyle  Make any lifestyle changes that your health care provider recommends.  Eat a healthy, well-balanced diet.  Take steps to lose weight if you are overweight.  Avoid using depressants, including alcohol, sedatives, and narcotics.  Do not use any products that contain nicotine or tobacco, such as cigarettes, e-cigarettes, and chewing tobacco. If you need  help quitting, ask your health care provider. General instructions  Take over-the-counter and prescription medicines only as told by your health care provider.  If you were given a device to open your airway while you sleep, use it only as told by your health care provider.  If you are having surgery, make sure to tell your health care provider you have sleep apnea. You may need to bring your device with you.  Keep all follow-up visits as told by your health care provider. This is important. Contact a health care provider  if:  The device that you received to open your airway during sleep is uncomfortable or does not seem to be working.  Your symptoms do not improve.  Your symptoms get worse. Get help right away if:  You develop: ? Chest pain. ? Shortness of breath. ? Discomfort in your back, arms, or stomach.  You have: ? Trouble speaking. ? Weakness on one side of your body. ? Drooping in your face. These symptoms may represent a serious problem that is an emergency. Do not wait to see if the symptoms will go away. Get medical help right away. Call your local emergency services (911 in the [place] Do not drive yourself to the hospital. Summary  Sleep apnea is a condition in which breathing pauses or becomes shallow during sleep.  The most common cause is a collapsed or blocked airway.  The goal of treatment is to restore normal breathing and to ease symptoms during sleep. This information is not intended to replace advice given to you by your health care provider. Make sure you discuss any questions you have with your health care provider. Document Revised: 05/12/2019 Document Reviewed: 07/21/2018 Elsevier Patient Education  2021 [employer].

## 2021-02-06 ENCOUNTER — Encounter: Payer: Self-pay | Admitting: Hospice and Palliative Medicine

## 2021-02-06 ENCOUNTER — Telehealth: Payer: Self-pay

## 2021-02-06 ENCOUNTER — Ambulatory Visit: Payer: 59 | Admitting: Hospice and Palliative Medicine

## 2021-02-06 VITALS — BP 151/67 | HR 81 | Temp 97.7°F | Resp 16 | Ht 64.0 in | Wt 321.6 lb

## 2021-02-06 DIAGNOSIS — G4719 Other hypersomnia: Secondary | ICD-10-CM | POA: Diagnosis not present

## 2021-02-06 DIAGNOSIS — I10 Essential (primary) hypertension: Secondary | ICD-10-CM

## 2021-02-06 DIAGNOSIS — N950 Postmenopausal bleeding: Secondary | ICD-10-CM | POA: Diagnosis not present

## 2021-02-06 DIAGNOSIS — Z9119 Patient's noncompliance with other medical treatment and regimen: Secondary | ICD-10-CM

## 2021-02-06 DIAGNOSIS — Z91199 Patient's noncompliance with other medical treatment and regimen due to unspecified reason: Secondary | ICD-10-CM

## 2021-02-06 LAB — SURGICAL PATHOLOGY

## 2021-02-06 MED ORDER — LOSARTAN POTASSIUM-HCTZ 50-12.5 MG PO TABS: 1.0000 | ORAL_TABLET | Freq: Every day | ORAL | 0 refills | Status: DC

## 2021-02-06 NOTE — Telephone Encounter (Signed)
Gave feeling great orders for sleep study. Stephanie Gates

## 2021-02-06 NOTE — Progress Notes (Signed)
[facility] [place], [place] [address]  Internal MEDICINE  Office Visit Note  Patient Name: Stephanie Gates  [id number]  [id number]  Date of Service: 02/07/2021  Chief Complaint  Patient presents with  . Follow-up  . Hypertension    HPI Patient is here for routine follow-up Recently seen by GYN for abnormal post-menopausal bleeding, underwent biopsy of cervical polyp, awaiting pathology results No further vaginal bleeding At GYN visit provider recommended sleep study--HST has been ordered in the past but was denied by insurance--will proceed with PSG  She has yet to call GI office back to schedule her colonoscopy Has also not yet called to scheduled her mammogram  BP elevated today--has not been taking her medications  Current Medication: Outpatient Encounter Medications as of 02/06/2021  Medication Sig  . buPROPion (WELLBUTRIN XL) 150 MG 24 hr tablet Take one tablet at 8AM and take one tablet at 2pm daily.  . celecoxib (CELEBREX) 200 MG capsule Take 1 capsule (200 mg total) by mouth daily.  . Cyanocobalamin (VITAMIN B-12 PO) Take by mouth.  . hydrOXYzine (ATARAX/VISTARIL) 10 MG tablet Take 1 tablet (10 mg total) by mouth 3 (three) times daily as needed.  Marland Kitchen losartan-hydrochlorothiazide (HYZAAR) 50-12.5 MG tablet Take 1 tablet by mouth daily.  . Multiple Vitamin (MULTIVITAMIN) capsule Take 1 capsule by mouth daily.  . pantoprazole (PROTONIX) 40 MG tablet TAKE 1 TABLET(40 MG) BY MOUTH DAILY  . [DISCONTINUED] hydrochlorothiazide (MICROZIDE) 12.5 MG capsule Take 1 capsule (12.5 mg total) by mouth daily. PM  . [DISCONTINUED] cyclobenzaprine (FLEXERIL) 10 MG tablet TAKE ONE TAB PO QHS FOR NECK PAIN PRN  . [DISCONTINUED] topiramate (TOPAMAX) 15 MG capsule Take 1 capsule (15 mg total) by mouth at bedtime as needed. (Patient not taking: No sig reported)   No facility-administered encounter medications on file as of 02/06/2021.    Surgical History: Past  Surgical History:  Procedure Laterality Date  . COLONOSCOPY N/A 05/03/2015   Procedure: COLONOSCOPY;  Surgeon: Lucilla Lame, MD;  Location: [facility];  Service: Gastroenterology;  Laterality: N/A;  . ESOPHAGOGASTRODUODENOSCOPY N/A 05/03/2015   Procedure: ESOPHAGOGASTRODUODENOSCOPY (EGD);  Surgeon: Lucilla Lame, MD;  Location: [facility];  Service: Gastroenterology;  Laterality: N/A;  . FOOT SURGERY  2014  . Gastric bypass surgery    . POLYPECTOMY  05/03/2015   Procedure: POLYPECTOMY INTESTINAL;  Surgeon: Lucilla Lame, MD;  Location: [facility];  Service: Gastroenterology;;    Medical History: Past Medical History:  Diagnosis Date  . Allergy    seasonal  . Anemia   . Arthritis    right hip  . Headache    sinus/allergies  . Hypertension   . Seasonal allergies   . Vertigo    1-2x/month  . Wears contact lenses     Family History: Family History  Problem Relation Age of Onset  . Diabetes Mother   . Hypertension Mother   . Cancer Father   . Diabetes Father   . Hypertension Father     Social History   Socioeconomic History  . Marital status: Single    Spouse name: Not on file  . Number of children: Not on file  . Years of education: Not on file  . Highest education level: Not on file  Occupational History  . Not on file  Tobacco Use  . Smoking status: Never Smoker  . Smokeless tobacco: Never Used  Vaping Use  . Vaping Use: Never used  Substance and Sexual Activity  .  Alcohol use: No  . Drug use: No  . Sexual activity: Not Currently  Other Topics Concern  . Not on file  Social History Narrative  . Not on file   Social Determinants of Health   Financial Resource Strain: Not on file  Food Insecurity: Not on file  Transportation Needs: Not on file  Physical Activity: Not on file  Stress: Not on file  Social Connections: Not on file  Intimate Partner Violence: Not on file      Review of Systems  Constitutional: Negative for  chills, diaphoresis and fatigue.  HENT: Negative for ear pain, postnasal drip and sinus pressure.   Eyes: Negative for photophobia, discharge, redness, itching and visual disturbance.  Respiratory: Negative for cough, shortness of breath and wheezing.   Cardiovascular: Negative for chest pain, palpitations and leg swelling.  Gastrointestinal: Negative for abdominal pain, constipation, diarrhea, nausea and vomiting.  Genitourinary: Negative for dysuria and flank pain.  Musculoskeletal: Negative for arthralgias, back pain, gait problem and neck pain.  Skin: Negative for color change.  Allergic/Immunologic: Negative for environmental allergies and food allergies.  Neurological: Negative for dizziness and headaches.  Hematological: Does not bruise/bleed easily.  Psychiatric/Behavioral: Negative for agitation, behavioral problems (depression) and hallucinations.    Vital Signs: BP (!) 151/67   Pulse 81   Temp 97.7 F (36.5 C)   Resp 16   Ht 5\' 4"  (1.626 m)   Wt (!) 321 lb 9.6 oz (145.9 kg)   LMP 02/11/2017   SpO2 99%   BMI 55.20 kg/m    Physical Exam Vitals reviewed.  Constitutional:      Appearance: Normal appearance. She is obese.  Cardiovascular:     Rate and Rhythm: Normal rate and regular rhythm.     Pulses: Normal pulses.     Heart sounds: Normal heart sounds.  Pulmonary:     Effort: Pulmonary effort is normal.     Breath sounds: Normal breath sounds.  Abdominal:     General: Abdomen is flat.     Palpations: Abdomen is soft.  Musculoskeletal:        General: Normal range of motion.     Cervical back: Normal range of motion.  Skin:    General: Skin is warm.  Neurological:     General: No focal deficit present.     Mental Status: She is alert and oriented to person, place, and time. Mental status is at baseline.  Psychiatric:        Mood and Affect: Mood normal.        Behavior: Behavior normal.        Thought Content: Thought content normal.        Judgment:  Judgment normal.    EPWORTH SLEEPINESS SCALE:  Scale:  (0)= no chance of dozing; (1)= slight chance of dozing; (2)= moderate chance of dozing; (3)= high chance of dozing  Chance  Situtation    Sitting and reading: 2    Watching TV: 2    Sitting Inactive in public: 0    As a passenger in car: 3      Lying down to rest: 3    Sitting and talking: 0    Sitting quielty after lunch: 3    In a car, stopped in traffic: 1   TOTAL SCORE:   14 out of 24    Assessment/Plan: 1. Excessive daytime sleepiness Risk factors include morbid obesity, excessive fatigue and uncontrolled HTN [place] 14 out of 24 - PSG Sleep Study;  Future  2. Morbid (severe) obesity due to excess calories (HCC) BMI 55 Obesity Counseling: Risk Assessment: An assessment of behavioral risk factors was made today and includes lack of exercise sedentary lifestyle, lack of portion control and poor dietary habits.  Risk Modification Advice: She was counseled on portion control guidelines. Restricting daily caloric intake to 1800. The detrimental long term effects of obesity on her health and ongoing poor compliance was also discussed with the patient.  3. Essential hypertension BP uncontrolled, non compliant with medication, will start Hyzaar - losartan-hydrochlorothiazide (HYZAAR) 50-12.5 MG tablet; Take 1 tablet by mouth daily.  Dispense: 90 tablet; Refill: 0  4. Abnormal vaginal bleeding in postmenopausal patient Awaiting pathology report from cervical polyp biopsy  5. Non-adherence to medical treatment Stressed the importance of following through with mammogram and colonoscopy screening Encouraged to set alarm on cell phone as a reminder to take medciations  General Counseling: kiernan farkas understanding of the findings of todays visit and agrees with plan of treatment. I have discussed any further diagnostic evaluation that may be needed or ordered today. We also reviewed her medications today. she  has been encouraged to call the office with any questions or concerns that should arise related to todays visit.    Orders Placed This Encounter  Procedures  . PSG Sleep Study    Meds ordered this encounter  Medications  . losartan-hydrochlorothiazide (HYZAAR) 50-12.5 MG tablet    Sig: Take 1 tablet by mouth daily.    Dispense:  90 tablet    Refill:  0    Time spent: 30 Minutes Time spent includes review of chart, medications, test results and follow-up plan with the patient.  This patient was seen by Theodoro Grist AGNP-C in Collaboration with Dr Lavera Guise as a part of collaborative care agreement     Tanna Furry. Sadako Cegielski AGNP-C Internal medicine

## 2021-02-07 ENCOUNTER — Encounter: Payer: Self-pay | Admitting: Hospice and Palliative Medicine

## 2021-02-09 ENCOUNTER — Telehealth: Payer: Self-pay

## 2021-02-09 NOTE — Telephone Encounter (Signed)
Called and discussed with patient

## 2021-02-09 NOTE — Telephone Encounter (Signed)
Pt calling; was here Monday; wants doctor to give her the bx results. [phone]

## 2021-02-14 ENCOUNTER — Telehealth (INDEPENDENT_AMBULATORY_CARE_PROVIDER_SITE_OTHER): Payer: Self-pay | Admitting: Gastroenterology

## 2021-02-14 ENCOUNTER — Other Ambulatory Visit: Payer: Self-pay

## 2021-02-14 DIAGNOSIS — Z86010 Personal history of colonic polyps: Secondary | ICD-10-CM

## 2021-02-14 MED ORDER — NA SULFATE-K SULFATE-MG SULF 17.5-3.13-1.6 GM/177ML PO SOLN: 1.0000 | Freq: Once | ORAL | 0 refills | Status: AC

## 2021-02-14 NOTE — Progress Notes (Signed)
Gastroenterology Pre-Procedure Review  Request Date: Thursday 02/22/21 Requesting Physician: Dr. Allen Norris  PATIENT REVIEW QUESTIONS: The patient responded to the following health history questions as indicated:    1. Are you having any GI issues? no 2. Do you have a personal history of Polyps? yes (05/03/15 colonoscopy performed by Dr. Allen Norris. Polyps were noted.) 3. Do you have a family history of Colon Cancer or Polyps? no 4. Diabetes Mellitus? no 5. Joint replacements in the past 12 months?no 6. Major health problems in the past 3 months?no 7. Any artificial heart valves, MVP, or defibrillator?no    MEDICATIONS & ALLERGIES:    Patient reports the following regarding taking any anticoagulation/antiplatelet therapy:   Plavix, Coumadin, Eliquis, Xarelto, Lovenox, Pradaxa, Brilinta, or Effient? no Aspirin? no  Patient confirms/reports the following medications:  Current Outpatient Medications  Medication Sig Dispense Refill  . buPROPion (WELLBUTRIN XL) 150 MG 24 hr tablet Take one tablet at 8AM and take one tablet at 2pm daily. 60 tablet 2  . celecoxib (CELEBREX) 200 MG capsule Take 1 capsule (200 mg total) by mouth daily. 90 capsule 1  . Cyanocobalamin (VITAMIN B-12 PO) Take by mouth.    . hydrOXYzine (ATARAX/VISTARIL) 10 MG tablet Take 1 tablet (10 mg total) by mouth 3 (three) times daily as needed. 30 tablet 0  . losartan-hydrochlorothiazide (HYZAAR) 50-12.5 MG tablet Take 1 tablet by mouth daily. 90 tablet 0  . Multiple Vitamin (MULTIVITAMIN) capsule Take 1 capsule by mouth daily.    . pantoprazole (PROTONIX) 40 MG tablet TAKE 1 TABLET(40 MG) BY MOUTH DAILY 90 tablet 0   No current facility-administered medications for this visit.    Patient confirms/reports the following allergies:  Allergies  Allergen Reactions  . Penicillin G Itching  . Penicillins Itching    No orders of the defined types were placed in this encounter.   AUTHORIZATION INFORMATION Primary  Insurance: 1D#: Group #:  Secondary Insurance: 1D#: Group #:  SCHEDULE INFORMATION: Date: Thursday 02/22/21 Time: Location:[facility]

## 2021-02-20 ENCOUNTER — Other Ambulatory Visit: Payer: Self-pay

## 2021-02-20 ENCOUNTER — Other Ambulatory Visit
Admission: RE | Admit: 2021-02-20 | Discharge: 2021-02-20 | Disposition: A | Discharge status: Home / Self Care | Payer: 59 | Source: Ambulatory Visit | Attending: Gastroenterology | Admitting: Gastroenterology

## 2021-02-20 DIAGNOSIS — Z20822 Contact with and (suspected) exposure to covid-19: Secondary | ICD-10-CM | POA: Insufficient documentation

## 2021-02-20 DIAGNOSIS — Z01812 Encounter for preprocedural laboratory examination: Secondary | ICD-10-CM | POA: Diagnosis present

## 2021-02-21 LAB — SARS CORONAVIRUS 2 (TAT 6-24 HRS): SARS Coronavirus 2: NEGATIVE

## 2021-02-22 ENCOUNTER — Ambulatory Visit: Payer: 59 | Admitting: Registered Nurse

## 2021-02-22 ENCOUNTER — Other Ambulatory Visit: Payer: Self-pay

## 2021-02-22 ENCOUNTER — Encounter: Payer: Self-pay | Admitting: Gastroenterology

## 2021-02-22 ENCOUNTER — Ambulatory Visit
Admission: RE | Admit: 2021-02-22 | Discharge: 2021-02-22 | Disposition: A | Discharge status: Home / Self Care | Payer: 59 | Attending: Gastroenterology | Admitting: Gastroenterology

## 2021-02-22 ENCOUNTER — Encounter
Admission: RE | Disposition: A | Discharge status: Home / Self Care | Payer: Self-pay | Source: Home / Self Care | Attending: Gastroenterology

## 2021-02-22 DIAGNOSIS — Z1211 Encounter for screening for malignant neoplasm of colon: Secondary | ICD-10-CM | POA: Diagnosis present

## 2021-02-22 DIAGNOSIS — Z833 Family history of diabetes mellitus: Secondary | ICD-10-CM | POA: Diagnosis not present

## 2021-02-22 DIAGNOSIS — Z791 Long term (current) use of non-steroidal anti-inflammatories (NSAID): Secondary | ICD-10-CM | POA: Insufficient documentation

## 2021-02-22 DIAGNOSIS — K640 First degree hemorrhoids: Secondary | ICD-10-CM | POA: Insufficient documentation

## 2021-02-22 DIAGNOSIS — Z860100 Personal history of colon polyps, unspecified: Secondary | ICD-10-CM

## 2021-02-22 DIAGNOSIS — I10 Essential (primary) hypertension: Secondary | ICD-10-CM | POA: Insufficient documentation

## 2021-02-22 DIAGNOSIS — Z8249 Family history of ischemic heart disease and other diseases of the circulatory system: Secondary | ICD-10-CM | POA: Insufficient documentation

## 2021-02-22 DIAGNOSIS — Z880 Allergy status to penicillin: Secondary | ICD-10-CM | POA: Insufficient documentation

## 2021-02-22 DIAGNOSIS — Z79899 Other long term (current) drug therapy: Secondary | ICD-10-CM | POA: Diagnosis not present

## 2021-02-22 DIAGNOSIS — Z9884 Bariatric surgery status: Secondary | ICD-10-CM | POA: Insufficient documentation

## 2021-02-22 DIAGNOSIS — Z809 Family history of malignant neoplasm, unspecified: Secondary | ICD-10-CM | POA: Diagnosis not present

## 2021-02-22 HISTORY — PX: COLONOSCOPY WITH PROPOFOL: SHX5780

## 2021-02-22 LAB — POCT PREGNANCY, URINE: Preg Test, Ur: NEGATIVE

## 2021-02-22 SURGERY — COLONOSCOPY WITH PROPOFOL
Anesthesia: General

## 2021-02-22 MED ORDER — DEXMEDETOMIDINE HCL 200 MCG/2ML IV SOLN
INTRAVENOUS | Status: DC | PRN
  Administered 2021-02-22: 20 ug via INTRAVENOUS

## 2021-02-22 MED ORDER — SODIUM CHLORIDE 0.9 % IV SOLN: INTRAVENOUS | Status: DC

## 2021-02-22 MED ORDER — PROPOFOL 10 MG/ML IV BOLUS
INTRAVENOUS | Status: DC | PRN
  Administered 2021-02-22: 70 mg via INTRAVENOUS

## 2021-02-22 MED ORDER — KETAMINE HCL 10 MG/ML IJ SOLN
INTRAMUSCULAR | Status: DC | PRN
  Administered 2021-02-22: 50 mg via INTRAVENOUS

## 2021-02-22 MED ORDER — KETAMINE HCL 50 MG/5ML IJ SOSY
PREFILLED_SYRINGE | INTRAMUSCULAR | Status: AC
  Filled 2021-02-22: qty 5

## 2021-02-22 MED ORDER — PROPOFOL 500 MG/50ML IV EMUL
INTRAVENOUS | Status: DC | PRN
  Administered 2021-02-22: 100 ug/kg/min via INTRAVENOUS

## 2021-02-22 NOTE — Anesthesia Preprocedure Evaluation (Signed)
Anesthesia Evaluation  Patient identified by MRN, date of birth, ID band Patient awake    Reviewed: Allergy & Precautions, H&P , Patient's Chart, lab work & pertinent test results  Airway Mallampati: I  TM Distance: >3 FB Neck ROM: full    Dental no notable dental hx.    Pulmonary neg pulmonary ROS,    Pulmonary exam normal        Cardiovascular hypertension, Normal cardiovascular exam     Neuro/Psych  Headaches, negative psych ROS   GI/Hepatic Neg liver ROS, GERD  ,  Endo/Other  Morbid obesity  Renal/GU negative Renal ROS     Musculoskeletal  (+) Arthritis , Osteoarthritis,    Abdominal   Peds negative pediatric ROS (+)  Hematology  (+) anemia ,   Anesthesia Other Findings   Reproductive/Obstetrics                             Anesthesia Physical  Anesthesia Plan  ASA: II  Anesthesia Plan: General   Post-op Pain Management:    Induction:   PONV Risk Score and Plan: TIVA and Propofol infusion  Airway Management Planned:   Additional Equipment:   Intra-op Plan:   Post-operative Plan:   Informed Consent: I have reviewed the patients History and Physical, chart, labs and discussed the procedure including the risks, benefits and alternatives for the proposed anesthesia with the patient or authorized representative who has indicated his/her understanding and acceptance.       Plan Discussed with: CRNA  Anesthesia Plan Comments:         Anesthesia Quick Evaluation

## 2021-02-22 NOTE — Op Note (Signed)
[facility] Gastroenterology Patient Name: Stephanie Gates Procedure Date: 02/22/2021 8:26 AM MRN: [id number] Account #: [id number] Date of Birth: Mar 04, 1966 Admit Type: Outpatient Age: 55 Room: [facility] ENDO ROOM 4 Gender: Female Note Status: Finalized Procedure:             Colonoscopy Indications:           High risk colon cancer surveillance: Personal history                         of colonic polyps Providers:             Lucilla Lame MD, MD Referring MD:          Lavera Guise, MD (Referring MD) Medicines:             Propofol per Anesthesia Complications:         No immediate complications. Procedure:             Pre-Anesthesia Assessment:                        - Prior to the procedure, a History and Physical was                         performed, and patient medications and allergies were                         reviewed. The patient's tolerance of previous                         anesthesia was also reviewed. The risks and benefits                         of the procedure and the sedation options and risks                         were discussed with the patient. All questions were                         answered, and informed consent was obtained. Prior                         Anticoagulants: The patient has taken no previous                         anticoagulant or antiplatelet agents. ASA Grade                         Assessment: II - A patient with mild systemic disease.                         After reviewing the risks and benefits, the patient                         was deemed in satisfactory condition to undergo the                         procedure.  After obtaining informed consent, the colonoscope was                         passed under direct vision. Throughout the procedure,                         the patient's blood pressure, pulse, and oxygen                         saturations were monitored continuously. The                          Colonoscope was introduced through the anus and                         advanced to the the cecum, identified by appendiceal                         orifice and ileocecal valve. The colonoscopy was                         performed without difficulty. The patient tolerated                         the procedure well. The quality of the bowel                         preparation was excellent. Findings:      The perianal and digital rectal examinations were normal.      Non-bleeding internal hemorrhoids were found during retroflexion. The       hemorrhoids were Grade I (internal hemorrhoids that do not prolapse). Impression:            - Non-bleeding internal hemorrhoids.                        - No specimens collected. Recommendation:        - Discharge patient to home.                        - Resume previous diet.                        - Continue present medications.                        - Repeat colonoscopy in 7 years for surveillance. Procedure Code(s):     --- Professional ---                        [phone], Colonoscopy, flexible; diagnostic, including                         collection of specimen(s) by brushing or washing, when                         performed (separate procedure) Diagnosis Code(s):     --- Professional ---                        Z86.010, Personal history of colonic polyps  CPT copyright 2019 [employer] Medical Association. All rights reserved. The codes documented in this report are preliminary and upon coder review may  be revised to meet current compliance requirements. Lucilla Lame MD, MD 02/22/2021 8:45:37 AM This report has been signed electronically. Number of Addenda: 0 Note Initiated On: 02/22/2021 8:26 AM Scope Withdrawal Time: 0 hours 6 minutes 31 seconds  Total Procedure Duration: 0 hours 12 minutes 23 seconds  Estimated Blood Loss:  Estimated blood loss: none.      [facility]

## 2021-02-22 NOTE — Transfer of Care (Signed)
Immediate Anesthesia Transfer of Care Note  Patient: Stephanie Gates  Procedure(s) Performed: COLONOSCOPY WITH PROPOFOL (N/A )  Patient Location: Endoscopy Unit  Anesthesia Type:General  Level of Consciousness: drowsy and patient cooperative  Airway & Oxygen Therapy: Patient Spontanous Breathing  Post-op Assessment: Report given to RN and Post -op Vital signs reviewed and stable  Post vital signs: Reviewed and stable  Last Vitals:  Vitals Value Taken Time  BP 102/21 02/22/21 0852  Temp 36 C 02/22/21 0852  Pulse 83 02/22/21 0854  Resp 17 02/22/21 0854  SpO2 97 % 02/22/21 0854  Vitals shown include unvalidated device data.  Last Pain:  Vitals:   02/22/21 0852  TempSrc: Temporal  PainSc:          Complications: No complications documented.

## 2021-02-22 NOTE — Anesthesia Postprocedure Evaluation (Signed)
Anesthesia Post Note  Patient: Stephanie Gates  Procedure(s) Performed: COLONOSCOPY WITH PROPOFOL (N/A )  Patient location during evaluation: Endoscopy Anesthesia Type: General Level of consciousness: awake and alert and oriented Pain management: pain level controlled Vital Signs Assessment: post-procedure vital signs reviewed and stable Respiratory status: spontaneous breathing Cardiovascular status: blood pressure returned to baseline Anesthetic complications: no   No complications documented.   Last Vitals:  Vitals:   02/22/21 0902 02/22/21 0912  BP:  (!) 142/84  Pulse: 79 78  Resp: 18 19  Temp:    SpO2: 99% 99%    Last Pain:  Vitals:   02/22/21 0912  TempSrc:   PainSc: 0-No pain                 Keelyn Fjelstad

## 2021-02-22 NOTE — H&P (Signed)
Lucilla Lame, MD [address] [place], [place] [address] [phone] Fax : [phone]  Primary Care Physician:  Lavera Guise, MD Primary Gastroenterologist:  Dr. Allen Norris  Pre-Procedure History & Physical: HPI:  Stephanie Gates is a 55 y.o. female is here for an colonoscopy.   Past Medical History:  Diagnosis Date  . Allergy    seasonal  . Anemia   . Arthritis    right hip  . Headache    sinus/allergies  . Hypertension   . Seasonal allergies   . Vertigo    1-2x/month  . Wears contact lenses     Past Surgical History:  Procedure Laterality Date  . COLONOSCOPY N/A 05/03/2015   Procedure: COLONOSCOPY;  Surgeon: Lucilla Lame, MD;  Location: [place];  Service: Gastroenterology;  Laterality: N/A;  . COLONOSCOPY    . ESOPHAGOGASTRODUODENOSCOPY N/A 05/03/2015   Procedure: ESOPHAGOGASTRODUODENOSCOPY (EGD);  Surgeon: Lucilla Lame, MD;  Location: [place];  Service: Gastroenterology;  Laterality: N/A;  . FOOT SURGERY  2014  . Gastric bypass surgery    . POLYPECTOMY  05/03/2015   Procedure: POLYPECTOMY INTESTINAL;  Surgeon: Lucilla Lame, MD;  Location: [place];  Service: Gastroenterology;;    Prior to Admission medications   Medication Sig Start Date End Date Taking? Authorizing Provider  Cyanocobalamin (VITAMIN B-12 PO) Take by mouth.   Yes [provider]  Multiple Vitamin (MULTIVITAMIN) capsule Take 1 capsule by mouth daily.   Yes [provider]  pantoprazole (PROTONIX) 40 MG tablet TAKE 1 TABLET(40 MG) BY MOUTH DAILY 10/24/20  Yes Luiz Ochoa, NP  buPROPion (WELLBUTRIN XL) 150 MG 24 hr tablet Take one tablet at 8AM and take one tablet at 2pm daily. 01/10/21   Luiz Ochoa, NP  celecoxib (CELEBREX) 200 MG capsule Take 1 capsule (200 mg total) by mouth daily. 01/14/20   Ronnell Freshwater, NP  hydrOXYzine (ATARAX/VISTARIL) 10 MG tablet Take 1 tablet (10 mg total) by mouth 3 (three) times daily as  needed. 11/15/20   Ronnell Freshwater, NP  losartan-hydrochlorothiazide (HYZAAR) 50-12.5 MG tablet Take 1 tablet by mouth daily. 02/06/21   Luiz Ochoa, NP    Allergies as of 02/15/2021 - Review Complete 02/14/2021  Allergen Reaction Noted  . Penicillin g Itching 04/20/2015  . Penicillins Itching 01/24/2015    Family History  Problem Relation Age of Onset  . Diabetes Mother   . Hypertension Mother   . Cancer Father   . Diabetes Father   . Hypertension Father     Social History   Socioeconomic History  . Marital status: Single    Spouse name: Not on file  . Number of children: Not on file  . Years of education: Not on file  . Highest education level: Not on file  Occupational History  . Not on file  Tobacco Use  . Smoking status: Never Smoker  . Smokeless tobacco: Never Used  Vaping Use  . Vaping Use: Never used  Substance and Sexual Activity  . Alcohol use: No  . Drug use: No  . Sexual activity: Not Currently  Other Topics Concern  . Not on file  Social History Narrative  . Not on file   Social Determinants of Health   Financial Resource Strain: Not on file  Food Insecurity: Not on file  Transportation Needs: Not on file  Physical Activity: Not on file  Stress: Not on file  Social Connections: Not on file  Intimate Partner  Violence: Not on file    Review of Systems: See HPI, otherwise negative ROS  Physical Exam: BP (!) 148/94   Pulse 86   Temp (!) 97.3 F (36.3 C) (Temporal)   Ht 5\' 5"  (1.651 m)   Wt (!) 143.3 kg   LMP 02/11/2017   SpO2 100%   BMI 52.59 kg/m  General:   Alert,  pleasant and cooperative in NAD Head:  Normocephalic and atraumatic. Neck:  Supple; no masses or thyromegaly. Lungs:  Clear throughout to auscultation.    Heart:  Regular rate and rhythm. Abdomen:  Soft, nontender and nondistended. Normal bowel sounds, without guarding, and without rebound.   Neurologic:  Alert and  oriented x4;  grossly normal  neurologically.  Impression/Plan: Stephanie Gates is here for an colonoscopy to be performed for history of colon polyps  Risks, benefits, limitations, and alternatives regarding  colonoscopy have been reviewed with the patient.  Questions have been answered.  All parties agreeable.   Lucilla Lame, MD  02/22/2021, 8:17 AM

## 2021-02-23 ENCOUNTER — Encounter: Payer: Self-pay | Admitting: Gastroenterology

## 2021-02-23 ENCOUNTER — Encounter (INDEPENDENT_AMBULATORY_CARE_PROVIDER_SITE_OTHER): Payer: 59 | Admitting: Internal Medicine

## 2021-02-23 DIAGNOSIS — G4733 Obstructive sleep apnea (adult) (pediatric): Secondary | ICD-10-CM

## 2021-02-27 NOTE — Procedures (Signed)
[place] Report Part I                                                               Phone: [phone] Fax: [phone]  Patient Name: Stephanie Gates, Stephanie Gates Acquisition Number: [id number]  Date of Birth: [birth year] Acquisition Date: 02/23/2021  Referring Physician: Theodoro Grist, NP     History: The patient is a 55 year old female who was referred for evaluation of possible sleep apnea. Medical History: hypertension, arthritis, anemia, headaches, seasonal allergies.  Medications: celecoxib, cyanocobalamin, cyclobenzaprine, hydrochlorothiazide, multiple vitamins, pantoprazole, tramadol.  Procedure: This routine overnight polysomnogram was performed on the [facility] 5 using the standard diagnostic protocol. This included 6 channels of EEG, 2 channels of EOG, chin EMG, bilateral anterior tibialis EMG, nasal/oral thermistor, PTAF (nasal pressure transducer), chest and abdominal wall movements, EKG, and pulse oximetry.  Description: The total recording time was 409.2 minutes. The total sleep time was 304.5 minutes. There were a total of 100.1 minutes of wakefulness after sleep onset for a?reduced????sleep efficiency of 74.4%. The latency to sleep onset was short??? at 4.6 minutes. The R sleep onset latency was ????within normal limits at 70.5 minutes.?? Sleep parameters, as a percentage of the total sleep time, demonstrated 2.8% of sleep was in N1 sleep, 69.5% N2, 1.8% N3 and 25.9% R sleep. There were a total of 29 arousals for an arousal index of 5.7 arousals per hour of sleep that was normal.???  Respiratory monitoring demonstrated occasional mild to moderate degree of snoring. Less than 15 minutes of non-supine sleep were observed. There were 110 apneas and hypopneas for an Apnea Hypopnea Index of 21.7 apneas and hypopneas per hour of sleep. The REM related apnea hypopnea index was 50.1/hr of REM sleep compared to a NREM AHI of 11.7/hr.  The average duration of the  respiratory events was 19.6 seconds with a maximum duration of 36.5 seconds.  The respiratory events were associated with peripheral oxygen desaturations on the average to 87%. The lowest oxygen desaturation associated with a respiratory event was 72%. Additionally, the baseline oxygen saturation during wakefulness was 98%, during NREM sleep averaged 98%, and during REM sleep averaged 94%. The total duration of oxygen < 90% was 14.1 minutes and <80% was 1.9 minutes.  Cardiac monitoring- did not demonstrate transient cardiac decelerations associated with the apneas. There were no significant cardiac rhythm irregularities.   Periodic limb movement monitoring- did not demonstrate periodic limb movements.     Impression: ???This routine overnight polysomnogram demonstrated significant obstructive sleep apnea with an overall Apnea Hypopnea Index of 21.7 apneas and hypopneas per hour of sleep, which increased to 50.1 in REM sleep. Almost all sleep was in the supine position.  There was a reduced sleep efficiency with a reduced REM percentage. These findings would appear to be due to the obstructive sleep apnea.???????  ??Recommendations:  1. A CPAP titration would be recommended due to the severity of the sleep apnea. Some supine sleep should be ensured to optimize the titration. 2. Additionally, would recommend weight loss in a patient with a BMI of 53.2 lb/in2    Allyne Gee, MD, [facility] Diplomate ABMS-Pulmonary, Critical Care and Sleep Medicine  Electronically reviewed and digitally signed    [facility] Polysomnogram Report Part II  Phone: [phone] Fax: [phone]  Patient last name [facility] Neck Size 14.0 in. Acquisition [phone]  Patient first name Stephanie Gates Weight 320.0 lbs. Started 02/23/2021 at 10:16:36 PM  Birth date 1966/08/20 Height 65.0 in. Stopped 02/24/2021 at 5:10:12 AM  Age 89 BMI 53.2 lb/in2 Duration 409.2  Study Type Adult      Stephanie Gates, RPSGT Sleep  Data: Lights Out: 10:21:00 PM Sleep Onset: 10:25:36 PM  Lights On: 5:10:12 AM Sleep Efficiency: 74.4 %  Total Recording Time: 409.2 min Sleep Latency (from Lights Off) 4.6 min  Total Sleep Time (TST): 304.5 min R Latency (from Sleep Onset): 70.5 min  Sleep Period Time: 331.0 min Total number of awakenings: 12  Wake during sleep: 26.5 min Wake After Sleep Onset (WASO): 100.1 min   Sleep Data:         Arousal Summary: Stage  Latency from lights out (min) Latency from sleep onset (min) Duration (min) % Total Sleep Time  Normal values  N 1 4.6 0.0 8.5 2.8 (5%)  N 2 6.1 1.5 211.5 69.5 (50%)  N 3 127.6 123.0 5.5 1.8 (20%)  R 75.1 70.5 79.0 25.9 (25%)    Number Index  Spontaneous 35 6.9  Apneas & Hypopneas 20 3.9  RERAs 0 0.0       (Apneas & Hypopneas & RERAs)  (20) (3.9)  Limb Movement 0 0.0  Snore 0 0.0  TOTAL 55 10.8      Respiratory Data:  CA OA MA Apnea Hypopnea* A+ H RERA Total  Number 0 27 0 27 83 110 0 110  Mean Dur (sec) 0.0 14.7 0.0 14.7 21.2 19.6 0.0 19.6  Max Dur (sec) 0.0 25.0 0.0 25.0 36.5 36.5 0.0 36.5  Total Dur (min) 0.0 6.6 0.0 6.6 29.3 36.0 0.0 36.0  % of TST 0.0 2.2 0.0 2.2 9.6 11.8 0.0 11.8  Index (#/h TST) 0.0 5.3 0.0 5.3 16.4 21.7 0.0 21.7  *Hypopneas scored based on 4% or greater desaturation.  Sleep Stage:        REM NREM TST  AHI 50.1 11.7 21.7  RDI 50.1 11.7 21.7            Body Position Data:  Sleep (min) TST (%) REM (min) NREM (min) CA (#) OA (#) MA (#) HYP (#) AHI (#/h) RERA (#) RDI (#/h) Desat (#)  Supine 290.3 95.34 65.9 224.4 0 27 0 80 22.1 0 22.1 132  Non-Supine 14.20 4.66 13.10 1.10 0.00 0.00 0.00 3.00 12.68 0 12.68 10.00  Left: 8.5 2.79 7.4 1.1 0 0 0 0 0.0 0 0.00 6  UP: 5.7 1.87 5.7 0.0 0 0 0 3 31.6 0 31.6 4     Snoring: Total number of snoring episodes  0  Total time with snoring    min (   % of sleep)   Oximetry Distribution:             WK REM NREM TOTAL  Average (%)   98 94 98 97  < 90% 1.5 12.4 0.2 14.1   < 80% 0.8 1.1 0.0 1.9  < 70% 0.8 0.0 0.0 0.8  # of Desaturations* 2 90 50 142  Desat Index (#/hour) 1.3 68.4 13.3 28.0  Desat Max (%) 10 21 12 21   Desat Max Dur (sec) 10.0 58.0 71.0 71.0  Approx Min O2 during sleep 72  Approx min O2 during a respiratory event 72  Was Oxygen added (Y/N) and final rate  No:   0 LPM  *Desaturations based on 3% or greater drop from baseline.   Cheyne Stokes Breathing: None Present   Heart Rate Summary:  Average Heart Rate During Sleep 78.6 bpm      Highest Heart Rate During Sleep (95th %) 89.0 bpm      Highest Heart Rate During Sleep 129 bpm      Highest Heart Rate During Recording (TIB) 179 bpm (artifact)   Heart Rate Observations: Event Type # Events   Bradycardia 0 Lowest HR Scored: N/A  Sinus Tachycardia During Sleep 0 Highest HR Scored: N/A  Narrow Complex Tachycardia 0 Highest HR Scored: N/A  Wide Complex Tachycardia 0 Highest HR Scored: N/A  Asystole 0 Longest Pause: N/A  Atrial Fibrillation 0 Duration Longest Event: N/A  Other Arrythmias  No Type:    Periodic Limb Movement Data: (Primary legs unless otherwise noted) Total # Limb Movement 0 Limb Movement Index 0.0  Total # PLMS    PLMS Index     Total # PLMS Arousals    PLMS Arousal Index     Percentage Sleep Time with PLMS   min (   % sleep)  Mean Duration limb movements (secs)

## 2021-03-12 ENCOUNTER — Ambulatory Visit (INDEPENDENT_AMBULATORY_CARE_PROVIDER_SITE_OTHER): Payer: 59 | Admitting: Internal Medicine

## 2021-03-12 ENCOUNTER — Telehealth: Payer: Self-pay

## 2021-03-12 VITALS — BP 143/85 | HR 87 | Temp 98.6°F | Resp 14 | Ht 65.0 in | Wt 315.0 lb

## 2021-03-12 DIAGNOSIS — G4733 Obstructive sleep apnea (adult) (pediatric): Secondary | ICD-10-CM | POA: Diagnosis not present

## 2021-03-12 DIAGNOSIS — Z6841 Body Mass Index (BMI) 40.0 and over, adult: Secondary | ICD-10-CM | POA: Diagnosis not present

## 2021-03-12 DIAGNOSIS — I10 Essential (primary) hypertension: Secondary | ICD-10-CM

## 2021-03-12 NOTE — Patient Instructions (Signed)

## 2021-03-12 NOTE — Progress Notes (Signed)
Sleep Medicine   Office Visit  Patient Name: Stephanie Gates DOB: January 21, 1966 MRN [id number]    Chief Complaint: study results/sleep apnea  Brief History:  Stephanie Gates  Was seen for initial consultation to discuss the results of her recent sleep study demonstrating moderate sleep apnea. . She was referred for sleep evaluation.She has a history of snoring but has no bed partner to report apnea. The patient relates the following symptoms: morning headaches.The patient goes to sleep at midnight and wakes up at 7 a.m. She wakes during the night.  she reports that she generally feels rested when she wakes but gets sleepy during the day.  She naps several days per week and dozes watching TV. Patient has noted no restlessness of her legs at night.  The patient  relates no unusual behavior during the night.  The Epworth Sleepiness Score is 13 out of 24 .  The patient relates  Cardiovascular risk factors include: hypertension. The patient reports poor memory.    ROS  General: (-) fever, (-) chills, (-) night sweat Nose and Sinuses: (-) nasal stuffiness or itchiness, (-) postnasal drip, (-) nosebleeds, (-) sinus trouble. Mouth and Throat: (-) sore throat, (-) hoarseness. Neck: (-) swollen glands, (-) enlarged thyroid, (-) neck pain. Respiratory: - cough, - shortness of breath, - wheezing. Neurologic: - numbness, - tingling. Psychiatric: - anxiety, - depression Sleep behavior: -sleep paralysis -hypnogogic hallucinations -dream enactment      -vivid dreams -cataplexy -night terrors -sleep walking   Current Medication: Outpatient Encounter Medications as of 03/12/2021  Medication Sig  . buPROPion (WELLBUTRIN XL) 150 MG 24 hr tablet Take one tablet at 8AM and take one tablet at 2pm daily.  . celecoxib (CELEBREX) 200 MG capsule Take 1 capsule (200 mg total) by mouth daily.  . Cyanocobalamin (VITAMIN B-12 PO) Take by mouth.  . hydrOXYzine (ATARAX/VISTARIL) 10 MG tablet Take 1 tablet (10 mg total) by  mouth 3 (three) times daily as needed.  Marland Kitchen losartan-hydrochlorothiazide (HYZAAR) 50-12.5 MG tablet Take 1 tablet by mouth daily.  . Multiple Vitamin (MULTIVITAMIN) capsule Take 1 capsule by mouth daily.  . pantoprazole (PROTONIX) 40 MG tablet TAKE 1 TABLET(40 MG) BY MOUTH DAILY   No facility-administered encounter medications on file as of 03/12/2021.    Surgical History: Past Surgical History:  Procedure Laterality Date  . COLONOSCOPY N/A 05/03/2015   Procedure: COLONOSCOPY;  Surgeon: Lucilla Lame, MD;  Location: [place];  Service: Gastroenterology;  Laterality: N/A;  . COLONOSCOPY    . COLONOSCOPY WITH PROPOFOL N/A 02/22/2021   Procedure: COLONOSCOPY WITH PROPOFOL;  Surgeon: Lucilla Lame, MD;  Location[facility] ENDOSCOPY;  Service: Endoscopy;  Laterality: N/A;  . ESOPHAGOGASTRODUODENOSCOPY N/A 05/03/2015   Procedure: ESOPHAGOGASTRODUODENOSCOPY (EGD);  Surgeon: Lucilla Lame, MD;  Location: [place];  Service: Gastroenterology;  Laterality: N/A;  . FOOT SURGERY  2014  . Gastric bypass surgery    . POLYPECTOMY  05/03/2015   Procedure: POLYPECTOMY INTESTINAL;  Surgeon: Lucilla Lame, MD;  Location: [place];  Service: Gastroenterology;;    Medical History: Past Medical History:  Diagnosis Date  . Allergy    seasonal  . Anemia   . Arthritis    right hip  . Headache    sinus/allergies  . Hypertension   . Seasonal allergies   . Vertigo    1-2x/month  . Wears contact lenses     Family History: Non contributory to the present illness  Social History: Social History   Socioeconomic History  . Marital  status: Single    Spouse name: Not on file  . Number of children: Not on file  . Years of education: Not on file  . Highest education level: Not on file  Occupational History  . Not on file  Tobacco Use  . Smoking status: Never Smoker  . Smokeless tobacco: Never Used  Vaping Use  . Vaping Use: Never used  Substance and Sexual Activity  . Alcohol  use: No  . Drug use: No  . Sexual activity: Not Currently  Other Topics Concern  . Not on file  Social History Narrative  . Not on file   Social Determinants of Health   Financial Resource Strain: Not on file  Food Insecurity: Not on file  Transportation Needs: Not on file  Physical Activity: Not on file  Stress: Not on file  Social Connections: Not on file  Intimate Partner Violence: Not on file    Vital Signs: Last menstrual period 02/11/2017.  Examination: General Appearance: The patient is well-developed, well-nourished, and in no distress. Neck Circumference: 37 Skin: Gross inspection of skin unremarkable. Head: normocephalic, no gross deformities. Eyes: no gross deformities noted. ENT: ears appear grossly normal Neurologic: Alert and oriented. No involuntary movements.    EPWORTH SLEEPINESS SCALE:  Scale:  (0)= no chance of dozing; (1)= slight chance of dozing; (2)= moderate chance of dozing; (3)= high chance of dozing  Chance  Situtation    Sitting and reading: 2    Watching TV: 1    Sitting Inactive in public: 1    As a passenger in car: 3      Lying down to rest: 3    Sitting and talking: 1    Sitting quielty after lunch: 2    In a car, stopped in traffic: 0   TOTAL SCORE:   13 out of 24    SLEEP STUDIES: PSG 02/23/21 - AHI 21.7,  REM AHI 50.1,  Low SpO2 72%  LABS: Recent Results (from the past 2160 hour(s))  Surgical pathology     Status: None   Collection Time: 02/05/21  9:50 AM  Result Value Ref Range   SURGICAL PATHOLOGY      SURGICAL PATHOLOGY CASE: [id number] PATIENT: Stephanie Gates Surgical Pathology Report     Clinical History: PMB, cervical polyp (cm)     FINAL MICROSCOPIC DIAGNOSIS:  A. ENDOMETRIUM, BIOPSY: - Inactive endometrium. - No atypia or carcinoma.  B. CERVIX, POLYPECTOMY: - Benign cervical polyp with nabothian cyst. - No dysplasia or carcinoma.     GROSS DESCRIPTION:  A: The specimen is  received in formalin and consists of a 1.4 x 0.9 x 0.3 cm aggregate of tan-red soft tissue and mucus.  The specimen is entirely submitted in 1 cassette.  B: The specimen is received in formalin and consists of a 0.7 x 0.5 x 0.3 cm aggregate of tan-white soft tissue mucus.  The specimen is entirely submitted in 1 cassette.  Craig Staggers 02/05/2021)    Final Diagnosis performed by Claudette Laws, MD.   Electronically signed 02/06/2021 Technical and / or Professional components performed at [facility]. [facility], [place] [address], [place], [place] [address].  Immunohistochemis try Technical component (if applicable) was performed at [facility]. [address], [place], [place], [place] [address].   IMMUNOHISTOCHEMISTRY DISCLAIMER (if applicable): Some of these immunohistochemical stains may have been developed and the performance characteristics determine by [facility]. Some may not have been cleared or approved by the U.S. Food  and Drug Administration. The FDA has determined that such clearance or approval is not necessary. This test is used for clinical purposes. It should not be regarded as investigational or for research. This laboratory is certified under the [employer] (CLIA-88) as qualified to perform high complexity clinical laboratory testing.  The controls stained appropriately.   SARS CORONAVIRUS 2 (TAT 6-24 HRS) Nasopharyngeal Nasopharyngeal Swab     Status: None   Collection Time: 02/20/21 12:39 PM   Specimen: Nasopharyngeal Swab  Result Value Ref Range   SARS Coronavirus 2 NEGATIVE NEGATIVE    Comment: (NOTE) SARS-CoV-2 target nucleic acids are NOT DETECTED.  The SARS-CoV-2 RNA is generally detectable in upper and lower respiratory specimens during the acute phase of infection. Negative results do not preclude SARS-CoV-2 infection, do not rule out co-infections with other pathogens, and should not be  used as the sole basis for treatment or other patient management decisions. Negative results must be combined with clinical observations, patient history, and epidemiological information. The expected result is Negative.  Fact Sheet for Patients: SugarRoll.be  Fact Sheet for Healthcare Providers: https://www.woods-mathews.com/  This test is not yet approved or cleared by the [place] FDA and  has been authorized for detection and/or diagnosis of SARS-CoV-2 by FDA under an Emergency Use Authorization (EUA). This EUA will remain  in effect (meaning this test can be used) for the duration of the COVID-19 declaration under Se ction 564(b)(1) of the Act, 21 U.S.C. section 360bbb-3(b)(1), unless the authorization is terminated or revoked sooner.  Performed at [facility], [place] [address], [place], [place] [address]   Pregnancy, urine POC     Status: None   Collection Time: 02/22/21  8:03 AM  Result Value Ref Range   Preg Test, Ur NEGATIVE NEGATIVE    Comment:        THE SENSITIVITY OF THIS METHODOLOGY IS >24 mIU/mL     Radiology: No results found.  No results found.  No results found.    Assessment and Plan: Patient Active Problem List   Diagnosis Date Noted  . History of colonic polyps   . Allergic reaction 12/16/2020  . Pruritus 12/16/2020  . Allergic dermatitis 12/16/2020  . Acute pain of right knee 01/14/2020  . Neck pain 12/30/2018  . Nontoxic goiter, unspecified 12/12/2017  . Pain in left knee 12/12/2017  . Lateral epicondylitis, left elbow 12/12/2017  . Carpal tunnel syndrome, bilateral upper limbs 12/12/2017  . Gastro-esophageal reflux disease without esophagitis 12/12/2017  . Morbid (severe) obesity due to excess calories ([place]) 12/12/2017  . Headache 12/12/2017  . Snoring 12/12/2017  . Vitamin D deficiency, unspecified 12/12/2017  . Impaired fasting glucose 12/12/2017  . Hypersomnia, unspecified  12/12/2017  . Vasomotor rhinitis 12/12/2017  . Essential (primary) hypertension 12/12/2017  . Pain in right hip 12/12/2017  . Primary generalized (osteo)arthritis 12/12/2017  . IDA (iron deficiency anemia) 06/09/2015   1. OSA (obstructive sleep apnea) The study results, diagnosis and treatment recommendations were discussed with the patient Patient reports a history of loud snoring, disrupted sleep, morning headaches and daytime sleepiness Patient has comorbid cardiovascular risk factors including: hypertension which could be exacerbated by pathologic sleep-disordered breathing. Treatment options were discussed. A CPAP titration is recommended. OSA-a CPAP titration is recommended.    2. Essential (primary) hypertension Continue with treatment, discussed lifestyle measures. Hypertension Counseling:   The following hypertensive lifestyle modification were recommended and discussed:  1. Limiting alcohol intake to less than 1 oz/day  of ethanol:(24 oz of beer or 8 oz of wine or 2 oz of 100-proof whiskey). 2. Take baby ASA 81 mg daily. 3. Importance of regular aerobic exercise and losing weight. 4. Reduce dietary saturated fat and cholesterol intake for overall cardiovascular health. 5. Maintaining adequate dietary potassium, calcium, and magnesium intake. 6. Regular monitoring of the blood pressure. 7. Reduce sodium intake to less than 100 mmol/day (less than 2.3 gm of sodium or less than 6 gm of sodium choride)   3. Morbid (severe) obesity due to excess calories ([place]) Obesity Counseling: Had a lengthy discussion regarding patients BMI and weight issues. Patient was instructed on portion control as well as increased activity. Also discussed caloric restrictions with trying to maintain intake less than 2000 Kcal. Discussions were made in accordance with the 5As of weight management. Simple actions such as not eating late and if able to, taking a walk is suggested.  4. BMI 50.0-59.9, adult  ([place]) .set weight loss goal, increase vegetables      General Counseling: I have discussed the findings of the evaluation and examination with Olivia Mackie.  I have also discussed any further diagnostic evaluation thatmay be needed or ordered today. Mailey verbalizes understanding of the findings of todays visit. We also reviewed her medications today and discussed drug interactions and side effects including but not limited excessive drowsiness and altered mental states. We also discussed that there is always a risk not just to her but also people around her. she has been encouraged to call the office with any questions or concerns that should arise related to todays visit.  No orders of the defined types were placed in this encounter.       I have personally obtained a history, evaluated the patient, evaluated pertinent data, formulated the assessment and plan and placed orders.  This patient was seen today by Tressie Ellis, PA-C in collaboration with Dr. Devona Konig.  Richelle Ito Saunders Glance, PhD, FAASM  Diplomate, American Board of Sleep Medicine    Allyne Gee, MD [facility] Diplomate ABMS Pulmonary and Critical Care Medicine Sleep medicine

## 2021-03-12 NOTE — Telephone Encounter (Signed)
Error

## 2021-03-20 ENCOUNTER — Other Ambulatory Visit: Payer: Self-pay

## 2021-03-20 ENCOUNTER — Ambulatory Visit: Payer: 59 | Admitting: Hospice and Palliative Medicine

## 2021-03-20 ENCOUNTER — Encounter: Payer: Self-pay | Admitting: Hospice and Palliative Medicine

## 2021-03-20 VITALS — BP 138/80 | HR 85 | Temp 97.2°F | Resp 16 | Ht 65.0 in | Wt 316.8 lb

## 2021-03-20 DIAGNOSIS — G4733 Obstructive sleep apnea (adult) (pediatric): Secondary | ICD-10-CM

## 2021-03-20 DIAGNOSIS — I10 Essential (primary) hypertension: Secondary | ICD-10-CM

## 2021-03-20 DIAGNOSIS — M7918 Myalgia, other site: Secondary | ICD-10-CM

## 2021-03-20 MED ORDER — LOSARTAN POTASSIUM-HCTZ 50-12.5 MG PO TABS: 1.0000 | ORAL_TABLET | Freq: Every day | ORAL | 1 refills | Status: DC

## 2021-03-20 MED ORDER — BUPROPION HCL ER (XL) 150 MG PO TB24
ORAL_TABLET | ORAL | 2 refills | Status: DC
Start: 2021-03-20 — End: 2022-01-23

## 2021-03-20 NOTE — Progress Notes (Signed)
[facility] [place], [place] [address]  Internal MEDICINE  Office Visit Note  Patient Name: Stephanie Gates  [id number]  [id number]  Date of Service: 03/22/2021  Chief Complaint  Patient presents with  . Follow-up    Review sleep study, still has cramp in left butt cheek, painful   . Hypertension  . Anemia    HPI Patient is here for routine follow-up Reviewed recent PSG--AHI 21.7 with lowest SpO2 reading reordered at 72% Due to severity of OSA will need CPAP titration study Has had colonoscopy screening since our last visit--recommend repeat screening colonoscopy in 7 years BP well controlled today, has been taking Hzaar daily as prescribed Complaining of intermittent left gluteal//buttocks region--has been ongoing for several months, randomly occurring, lasts a few hours but improves throughout the day, happens a few times per week, not a daily pain Denies low back pain, pain radiating down leg or changes in bowel habits Continues to use Wellbutrin twice daily for help with weight loss, tolerating medication well without side effects    Current Medication: Outpatient Encounter Medications as of 03/20/2021  Medication Sig  . celecoxib (CELEBREX) 200 MG capsule Take 1 capsule (200 mg total) by mouth daily.  . Cyanocobalamin (VITAMIN B-12 PO) Take by mouth.  . hydrOXYzine (ATARAX/VISTARIL) 10 MG tablet Take 1 tablet (10 mg total) by mouth 3 (three) times daily as needed.  . Multiple Vitamin (MULTIVITAMIN) capsule Take 1 capsule by mouth daily.  . pantoprazole (PROTONIX) 40 MG tablet TAKE 1 TABLET(40 MG) BY MOUTH DAILY  . [DISCONTINUED] buPROPion (WELLBUTRIN XL) 150 MG 24 hr tablet Take one tablet at 8AM and take one tablet at 2pm daily.  . [DISCONTINUED] losartan-hydrochlorothiazide (HYZAAR) 50-12.5 MG tablet Take 1 tablet by mouth daily.  Marland Kitchen buPROPion (WELLBUTRIN XL) 150 MG 24 hr tablet Take one tablet at 8AM and take one tablet at 2pm daily.  Marland Kitchen  losartan-hydrochlorothiazide (HYZAAR) 50-12.5 MG tablet Take 1 tablet by mouth daily.   No facility-administered encounter medications on file as of 03/20/2021.    Surgical History: Past Surgical History:  Procedure Laterality Date  . COLONOSCOPY N/A 05/03/2015   Procedure: COLONOSCOPY;  Surgeon: Lucilla Lame, MD;  Location: [place];  Service: Gastroenterology;  Laterality: N/A;  . COLONOSCOPY    . COLONOSCOPY WITH PROPOFOL N/A 02/22/2021   Procedure: COLONOSCOPY WITH PROPOFOL;  Surgeon: Lucilla Lame, MD;  Location: [facility] ENDOSCOPY;  Service: Endoscopy;  Laterality: N/A;  . ESOPHAGOGASTRODUODENOSCOPY N/A 05/03/2015   Procedure: ESOPHAGOGASTRODUODENOSCOPY (EGD);  Surgeon: Lucilla Lame, MD;  Location: [place];  Service: Gastroenterology;  Laterality: N/A;  . FOOT SURGERY  2014  . Gastric bypass surgery    . POLYPECTOMY  05/03/2015   Procedure: POLYPECTOMY INTESTINAL;  Surgeon: Lucilla Lame, MD;  Location: [place];  Service: Gastroenterology;;    Medical History: Past Medical History:  Diagnosis Date  . Allergy    seasonal  . Anemia   . Arthritis    right hip  . Headache    sinus/allergies  . Hypertension   . Seasonal allergies   . Vertigo    1-2x/month  . Wears contact lenses     Family History: Family History  Problem Relation Age of Onset  . Diabetes Mother   . Hypertension Mother   . Cancer Father   . Diabetes Father   . Hypertension Father     Social History   Socioeconomic History  . Marital status: Single    Spouse name: Not  on file  . Number of children: Not on file  . Years of education: Not on file  . Highest education level: Not on file  Occupational History  . Not on file  Tobacco Use  . Smoking status: Never Smoker  . Smokeless tobacco: Never Used  Vaping Use  . Vaping Use: Never used  Substance and Sexual Activity  . Alcohol use: No  . Drug use: No  . Sexual activity: Not Currently  Other Topics Concern  . Not  on file  Social History Narrative  . Not on file   Social Determinants of Health   Financial Resource Strain: Not on file  Food Insecurity: Not on file  Transportation Needs: Not on file  Physical Activity: Not on file  Stress: Not on file  Social Connections: Not on file  Intimate Partner Violence: Not on file      Review of Systems  Constitutional: Negative for chills, diaphoresis and fatigue.  HENT: Negative for ear pain, postnasal drip and sinus pressure.   Eyes: Negative for photophobia, discharge, redness, itching and visual disturbance.  Respiratory: Negative for cough, shortness of breath and wheezing.   Cardiovascular: Negative for chest pain, palpitations and leg swelling.  Gastrointestinal: Negative for abdominal pain, constipation, diarrhea, nausea and vomiting.  Genitourinary: Negative for dysuria and flank pain.  Musculoskeletal: Negative for arthralgias, back pain, gait problem and neck pain.       Intermittent left gluteal pain  Skin: Negative for color change.  Allergic/Immunologic: Negative for environmental allergies and food allergies.  Neurological: Negative for dizziness and headaches.  Hematological: Does not bruise/bleed easily.  Psychiatric/Behavioral: Negative for agitation, behavioral problems (depression) and hallucinations.    Vital Signs: BP 138/80   Pulse 85   Temp (!) 97.2 F (36.2 C)   Resp 16   Ht 5\' 5"  (1.651 m)   Wt (!) 316 lb 12.8 oz (143.7 kg)   LMP 02/11/2017   SpO2 98%   BMI 52.72 kg/m    Physical Exam Vitals reviewed.  Constitutional:      Appearance: Normal appearance. She is obese.  Cardiovascular:     Rate and Rhythm: Normal rate and regular rhythm.     Pulses: Normal pulses.     Heart sounds: Normal heart sounds.  Pulmonary:     Effort: Pulmonary effort is normal.     Breath sounds: Normal breath sounds.  Abdominal:     General: Abdomen is flat.     Palpations: Abdomen is soft.  Musculoskeletal:         General: Normal range of motion.     Cervical back: Normal range of motion.  Skin:    General: Skin is warm.  Neurological:     General: No focal deficit present.     Mental Status: She is alert and oriented to person, place, and time. Mental status is at baseline.  Psychiatric:        Mood and Affect: Mood normal.        Behavior: Behavior normal.        Thought Content: Thought content normal.        Judgment: Judgment normal.     Assessment/Plan: 1. Essential hypertension BP and HR well controlled on present management, continue to monitor, requesting refills - losartan-hydrochlorothiazide (HYZAAR) 50-12.5 MG tablet; Take 1 tablet by mouth daily.  Dispense: 90 tablet; Refill: 1  2. Morbid (severe) obesity due to excess calories ([facility]) BMI 52, comorbid conditions include HTN and OSA Continue with Wellbutrin  for appetite suppression Obesity Counseling: Risk Assessment: An assessment of behavioral risk factors was made today and includes lack of exercise sedentary lifestyle, lack of portion control and poor dietary habits.  Risk Modification Advice: She was counseled on portion control guidelines. Restricting daily caloric intake to 1600. The detrimental long term effects of obesity on her health and ongoing poor compliance was also discussed with the patient. - buPROPion (WELLBUTRIN XL) 150 MG 24 hr tablet; Take one tablet at 8AM and take one tablet at 2pm daily.  Dispense: 60 tablet; Refill: 2  3. OSA (obstructive sleep apnea) Proceed with CPAP titration study due to severity of OSA - Cpap titration; Future  4. Gluteal pain May take OTC acetaminophen, encouraged to use heat/ice therapy as needed for pain Consider imaging if pain worsens or with increased frequency of pain  General Counseling: anagha loseke understanding of the findings of todays visit and agrees with plan of treatment. I have discussed any further diagnostic evaluation that may be needed or ordered today. We  also reviewed her medications today. she has been encouraged to call the office with any questions or concerns that should arise related to todays visit.    Orders Placed This Encounter  Procedures  . Cpap titration    Meds ordered this encounter  Medications  . buPROPion (WELLBUTRIN XL) 150 MG 24 hr tablet    Sig: Take one tablet at 8AM and take one tablet at 2pm daily.    Dispense:  60 tablet    Refill:  2  . losartan-hydrochlorothiazide (HYZAAR) 50-12.5 MG tablet    Sig: Take 1 tablet by mouth daily.    Dispense:  90 tablet    Refill:  1    Time spent: 30 Minutes Time spent includes review of chart, medications, test results and follow-up plan with the patient.  This patient was seen by Theodoro Grist AGNP-C in Collaboration with Dr Lavera Guise as a part of collaborative care agreement     Tanna Furry. Jean Skow AGNP-C Internal medicine

## 2021-03-22 ENCOUNTER — Encounter: Payer: Self-pay | Admitting: Hospice and Palliative Medicine

## 2021-05-02 ENCOUNTER — Ambulatory Visit: Payer: 59 | Admitting: Nurse Practitioner

## 2021-05-28 ENCOUNTER — Other Ambulatory Visit: Payer: Self-pay | Admitting: Nurse Practitioner

## 2021-05-28 DIAGNOSIS — Z1231 Encounter for screening mammogram for malignant neoplasm of breast: Secondary | ICD-10-CM

## 2021-06-05 ENCOUNTER — Other Ambulatory Visit: Payer: Self-pay

## 2021-06-05 ENCOUNTER — Ambulatory Visit
Admission: RE | Admit: 2021-06-05 | Discharge: 2021-06-05 | Disposition: A | Discharge status: Home / Self Care | Payer: 59 | Source: Ambulatory Visit | Attending: Nurse Practitioner | Admitting: Nurse Practitioner

## 2021-06-05 DIAGNOSIS — Z1231 Encounter for screening mammogram for malignant neoplasm of breast: Secondary | ICD-10-CM | POA: Diagnosis not present

## 2021-06-15 ENCOUNTER — Other Ambulatory Visit: Payer: Self-pay

## 2021-06-15 ENCOUNTER — Telehealth: Payer: Self-pay

## 2021-06-15 DIAGNOSIS — I10 Essential (primary) hypertension: Secondary | ICD-10-CM

## 2021-06-15 MED ORDER — LOSARTAN POTASSIUM-HCTZ 50-12.5 MG PO TABS: 1.0000 | ORAL_TABLET | Freq: Every day | ORAL | 0 refills | Status: DC

## 2021-06-15 NOTE — Telephone Encounter (Signed)
Patient is a aware of mammogram results.LNB

## 2021-06-15 NOTE — Telephone Encounter (Signed)
LMOM for pt to make appt for next refill

## 2021-07-05 ENCOUNTER — Telehealth: Payer: Self-pay

## 2021-07-05 NOTE — Telephone Encounter (Signed)
Left vm for 07/06/21 appointment screening-Toni

## 2021-07-06 ENCOUNTER — Ambulatory Visit: Payer: 59 | Admitting: Nurse Practitioner

## 2021-08-20 ENCOUNTER — Other Ambulatory Visit: Payer: Self-pay

## 2021-08-20 ENCOUNTER — Encounter: Payer: Self-pay | Admitting: Physician Assistant

## 2021-08-20 ENCOUNTER — Ambulatory Visit
Admission: RE | Admit: 2021-08-20 | Discharge: 2021-08-20 | Disposition: A | Discharge status: Home / Self Care | Payer: 59 | Attending: Physician Assistant | Admitting: Physician Assistant

## 2021-08-20 ENCOUNTER — Ambulatory Visit: Payer: 59 | Admitting: Physician Assistant

## 2021-08-20 ENCOUNTER — Ambulatory Visit
Admission: RE | Admit: 2021-08-20 | Discharge: 2021-08-20 | Disposition: A | Discharge status: Home / Self Care | Payer: 59 | Source: Ambulatory Visit | Attending: Physician Assistant | Admitting: Physician Assistant

## 2021-08-20 DIAGNOSIS — Z23 Encounter for immunization: Secondary | ICD-10-CM

## 2021-08-20 DIAGNOSIS — S76012A Strain of muscle, fascia and tendon of left hip, initial encounter: Secondary | ICD-10-CM | POA: Diagnosis not present

## 2021-08-20 DIAGNOSIS — M25552 Pain in left hip: Secondary | ICD-10-CM

## 2021-08-20 DIAGNOSIS — M7918 Myalgia, other site: Secondary | ICD-10-CM

## 2021-08-20 MED ORDER — ZOSTER VAC RECOMB ADJUVANTED 50 MCG/0.5ML IM SUSR
0.5000 mL | Freq: Once | INTRAMUSCULAR | 0 refills | Status: AC
Start: 2021-08-20 — End: 2021-08-20

## 2021-08-20 MED ORDER — CYCLOBENZAPRINE HCL 10 MG PO TABS: 10.0000 mg | ORAL_TABLET | Freq: Every day | ORAL | 0 refills | Status: DC

## 2021-08-20 MED ORDER — TETANUS-DIPHTH-ACELL PERTUSSIS 5-2.5-18.5 LF-MCG/0.5 IM SUSP: 0.5000 mL | Freq: Once | INTRAMUSCULAR | 0 refills | Status: AC

## 2021-08-20 NOTE — Progress Notes (Signed)
[facility] [place], [place] [address]  Internal MEDICINE  Office Visit Note  Patient Name: Stephanie Gates  [id number]  [id number]  Date of Service: 08/22/2021  Chief Complaint  Patient presents with   Leg Pain    2 weeks ago started having a stabbing feeling going into back of left leg and last night you kind of fell(not really fell) and twisted left leg and feels like it is a cutting feeling and felt swollen, hard to sit down     HPI Pt is here for a sick visit. -For the past 2 weeks has had pain in left gluteal region. Last night was on stepping stool and pain was bad and started to fall but caught herself. Denies any radiation, numbness or tingling. Feels like the muscle pulled when she caught herself.  -has tried tylenol and it helps some but then comes back -Painful to sit, cant flex at hip without pain in back of leg.   Current Medication:  Outpatient Encounter Medications as of 08/20/2021  Medication Sig   buPROPion (WELLBUTRIN XL) 150 MG 24 hr tablet Take one tablet at 8AM and take one tablet at 2pm daily.   celecoxib (CELEBREX) 200 MG capsule Take 1 capsule (200 mg total) by mouth daily.   Cyanocobalamin (VITAMIN B-12 PO) Take by mouth.   cyclobenzaprine (FLEXERIL) 10 MG tablet Take 1 tablet (10 mg total) by mouth at bedtime. Take one tab po qhs for back spasm prn only   losartan-hydrochlorothiazide (HYZAAR) 50-12.5 MG tablet Take 1 tablet by mouth daily.   Multiple Vitamin (MULTIVITAMIN) capsule Take 1 capsule by mouth daily.   pantoprazole (PROTONIX) 40 MG tablet TAKE 1 TABLET(40 MG) BY MOUTH DAILY   [EXPIRED] Tdap (BOOSTRIX) 5-2.5-18.5 LF-MCG/0.5 injection Inject 0.5 mLs into the muscle once for 1 dose.   [EXPIRED] Zoster Vaccine Adjuvanted [facility]) injection Inject 0.5 mLs into the muscle once for 1 dose.   [DISCONTINUED] hydrOXYzine (ATARAX/VISTARIL) 10 MG tablet Take 1 tablet (10 mg total) by mouth 3 (three) times daily as needed.  (Patient not taking: Reported on 08/20/2021)   No facility-administered encounter medications on file as of 08/20/2021.      Medical History: Past Medical History:  Diagnosis Date   Allergy    seasonal   Anemia    Arthritis    right hip   Headache    sinus/allergies   Hypertension    Seasonal allergies    Vertigo    1-2x/month   Wears contact lenses      Vital Signs: BP (!) 146/78   Pulse 90   Temp 98.1 F (36.7 C)   Resp 16   Ht '5\' 5"'$  (1.651 m)   Wt (!) 316 lb 12.8 oz (143.7 kg)   LMP 02/11/2017   SpO2 96%   BMI 52.72 kg/m    Review of Systems  Constitutional:  Negative for fatigue and fever.  HENT:  Negative for congestion, mouth sores and postnasal drip.   Respiratory:  Negative for cough.   Cardiovascular:  Negative for chest pain.  Genitourinary:  Negative for flank pain.  Musculoskeletal:  Positive for arthralgias and myalgias. Negative for back pain.       Left gluteal/back of leg pain  Neurological:  Negative for numbness.  Psychiatric/Behavioral: Negative.     Physical Exam Vitals and nursing note reviewed.  Constitutional:      General: She is not in acute distress.    Appearance: She is well-developed. She is  obese. She is not diaphoretic.  HENT:     Head: Normocephalic and atraumatic.     Mouth/Throat:     Pharynx: No oropharyngeal exudate.  Eyes:     Pupils: Pupils are equal, round, and reactive to light.  Neck:     Thyroid: No thyromegaly.     Vascular: No JVD.     Trachea: No tracheal deviation.  Cardiovascular:     Rate and Rhythm: Normal rate and regular rhythm.     Heart sounds: Normal heart sounds. No murmur heard.   No friction rub. No gallop.  Pulmonary:     Effort: Pulmonary effort is normal. No respiratory distress.     Breath sounds: No wheezing or rales.  Chest:     Chest wall: No tenderness.  Abdominal:     General: Bowel sounds are normal.     Palpations: Abdomen is soft.  Musculoskeletal:        General:  Tenderness present. No swelling. Normal range of motion.     Cervical back: Normal range of motion and neck supple.     Right lower leg: No edema.     Left lower leg: No edema.     Comments: TTP along left gluteal region, pain along this region with hip flexion and internal rotation. No TTP along back and negative straight leg raise  Lymphadenopathy:     Cervical: No cervical adenopathy.  Skin:    General: Skin is warm and dry.  Neurological:     Mental Status: She is alert and oriented to person, place, and time.     Cranial Nerves: No cranial nerve deficit.  Psychiatric:        Behavior: Behavior normal.        Thought Content: Thought content normal.        Judgment: Judgment normal.      Assessment/Plan: 1. Gluteal pain Will obtain hip xray and may take tylenol or ibuprofen as needed and flexeril at night. Will call if acute worsening. - DG Hip Unilat W OR W/O Pelvis 2-3 Views Left; Future  2. Hip pain, acute, left - DG Hip Unilat W OR W/O Pelvis 2-3 Views Left; Future  3. Muscle strain of gluteal region, left, initial encounter May try flexeril at night and do stretching exercises and use heat/ice. - cyclobenzaprine (FLEXERIL) 10 MG tablet; Take 1 tablet (10 mg total) by mouth at bedtime. Take one tab po qhs for back spasm prn only  Dispense: 30 tablet; Refill: 0  4. Need for Tdap vaccination - Tdap ([place]) 5-2.5-18.5 LF-MCG/0.5 injection; Inject 0.5 mLs into the muscle once for 1 dose.  Dispense: 0.5 mL; Refill: 0  5. Need for shingles vaccine - Zoster Vaccine Adjuvanted [facility]) injection; Inject 0.5 mLs into the muscle once for 1 dose.  Dispense: 0.5 mL; Refill: 0   General Counseling: Stephanie Gates verbalizes understanding of the findings of todays visit and agrees with plan of treatment. I have discussed any further diagnostic evaluation that may be needed or ordered today. We also reviewed her medications today. she has been encouraged to call the office with any  questions or concerns that should arise related to todays visit.    Counseling:    Orders Placed This Encounter  Procedures   DG Hip Unilat W OR W/O Pelvis 2-3 Views Left     Meds ordered this encounter  Medications   Tdap (BOOSTRIX) 5-2.5-18.5 LF-MCG/0.5 injection    Sig: Inject 0.5 mLs into the muscle once for 1  dose.    Dispense:  0.5 mL    Refill:  0   Zoster Vaccine Adjuvanted [facility]) injection    Sig: Inject 0.5 mLs into the muscle once for 1 dose.    Dispense:  0.5 mL    Refill:  0   cyclobenzaprine (FLEXERIL) 10 MG tablet    Sig: Take 1 tablet (10 mg total) by mouth at bedtime. Take one tab po qhs for back spasm prn only    Dispense:  30 tablet    Refill:  0     Time spent:35 Minutes

## 2021-08-24 ENCOUNTER — Telehealth: Payer: Self-pay

## 2021-08-24 NOTE — Telephone Encounter (Signed)
Pt advised for xray result chronic calcification no acute abnormality take muscle relaxer if she is not feeling we can send referral orthopedic

## 2021-09-27 ENCOUNTER — Ambulatory Visit: Payer: 59 | Admitting: Physician Assistant

## 2021-10-24 ENCOUNTER — Encounter: Payer: 59 | Admitting: Nurse Practitioner

## 2022-01-09 ENCOUNTER — Other Ambulatory Visit: Payer: Self-pay

## 2022-01-09 DIAGNOSIS — I10 Essential (primary) hypertension: Secondary | ICD-10-CM

## 2022-01-09 MED ORDER — LOSARTAN POTASSIUM-HCTZ 50-12.5 MG PO TABS: 1.0000 | ORAL_TABLET | Freq: Every day | ORAL | 0 refills | Status: DC

## 2022-01-22 ENCOUNTER — Telehealth: Payer: Self-pay

## 2022-01-22 NOTE — Telephone Encounter (Signed)
Left vm to confirm 01/23/22 appointment-Stephanie Gates

## 2022-01-23 ENCOUNTER — Ambulatory Visit (INDEPENDENT_AMBULATORY_CARE_PROVIDER_SITE_OTHER): Payer: 59 | Admitting: Nurse Practitioner

## 2022-01-23 ENCOUNTER — Encounter: Payer: Self-pay | Admitting: Nurse Practitioner

## 2022-01-23 ENCOUNTER — Other Ambulatory Visit: Payer: Self-pay

## 2022-01-23 VITALS — BP 138/68 | HR 77 | Temp 98.5°F | Resp 16 | Ht 65.0 in | Wt 314.6 lb

## 2022-01-23 DIAGNOSIS — E782 Mixed hyperlipidemia: Secondary | ICD-10-CM

## 2022-01-23 DIAGNOSIS — B351 Tinea unguium: Secondary | ICD-10-CM

## 2022-01-23 DIAGNOSIS — I10 Essential (primary) hypertension: Secondary | ICD-10-CM

## 2022-01-23 DIAGNOSIS — K219 Gastro-esophageal reflux disease without esophagitis: Secondary | ICD-10-CM | POA: Diagnosis not present

## 2022-01-23 DIAGNOSIS — D229 Melanocytic nevi, unspecified: Secondary | ICD-10-CM | POA: Diagnosis not present

## 2022-01-23 DIAGNOSIS — Z23 Encounter for immunization: Secondary | ICD-10-CM

## 2022-01-23 DIAGNOSIS — Z6841 Body Mass Index (BMI) 40.0 and over, adult: Secondary | ICD-10-CM

## 2022-01-23 DIAGNOSIS — R300 Dysuria: Secondary | ICD-10-CM

## 2022-01-23 DIAGNOSIS — D509 Iron deficiency anemia, unspecified: Secondary | ICD-10-CM

## 2022-01-23 DIAGNOSIS — Z0001 Encounter for general adult medical examination with abnormal findings: Secondary | ICD-10-CM

## 2022-01-23 DIAGNOSIS — E559 Vitamin D deficiency, unspecified: Secondary | ICD-10-CM

## 2022-01-23 DIAGNOSIS — S76012A Strain of muscle, fascia and tendon of left hip, initial encounter: Secondary | ICD-10-CM

## 2022-01-23 MED ORDER — AMLODIPINE BESYLATE 5 MG PO TABS: 5.0000 mg | ORAL_TABLET | Freq: Every day | ORAL | 1 refills | Status: DC

## 2022-01-23 MED ORDER — TRAMADOL HCL 50 MG PO TABS: 50.0000 mg | ORAL_TABLET | Freq: Three times a day (TID) | ORAL | 0 refills | Status: AC | PRN

## 2022-01-23 MED ORDER — PANTOPRAZOLE SODIUM 40 MG PO TBEC: DELAYED_RELEASE_TABLET | ORAL | 3 refills | Status: DC

## 2022-01-23 MED ORDER — CYCLOBENZAPRINE HCL 10 MG PO TABS: 10.0000 mg | ORAL_TABLET | Freq: Every day | ORAL | 0 refills | Status: DC

## 2022-01-23 MED ORDER — HYDROCHLOROTHIAZIDE 12.5 MG PO TABS: 12.5000 mg | ORAL_TABLET | Freq: Every day | ORAL | 3 refills | Status: DC

## 2022-01-23 MED ORDER — TETANUS-DIPHTH-ACELL PERTUSSIS 5-2.5-18.5 LF-MCG/0.5 IM SUSP: 0.5000 mL | Freq: Once | INTRAMUSCULAR | 0 refills | Status: AC

## 2022-01-23 MED ORDER — ZOSTER VAC RECOMB ADJUVANTED 50 MCG/0.5ML IM SUSR: 0.5000 mL | Freq: Once | INTRAMUSCULAR | 0 refills | Status: AC

## 2022-01-23 NOTE — Progress Notes (Unsigned)
[facility] [place], [place] [address]  Internal MEDICINE  Office Visit Note  Patient Name: Stephanie Gates  [id number]  [id number]  Date of Service: 01/23/2022  Chief Complaint  Patient presents with   Annual Exam    Pain in lower back into left leg, bump on left side of head   Hypertension   Anemia   Results   Back Pain    HPI Stephanie Gates presents for an annual well visit and physical exam. she has a history of ____.  -age appropriate screenings and immunizations as appropriate -COVID vacc status -current problems, concerns.  Medication refills Routine labs Work and home life:  Pain:      Current Medication: Outpatient Encounter Medications as of 01/23/2022  Medication Sig   buPROPion (WELLBUTRIN XL) 150 MG 24 hr tablet Take one tablet at 8AM and take one tablet at 2pm daily.   celecoxib (CELEBREX) 200 MG capsule Take 1 capsule (200 mg total) by mouth daily.   Cyanocobalamin (VITAMIN B-12 PO) Take by mouth.   cyclobenzaprine (FLEXERIL) 10 MG tablet Take 1 tablet (10 mg total) by mouth at bedtime. Take one tab po qhs for back spasm prn only   losartan-hydrochlorothiazide (HYZAAR) 50-12.5 MG tablet Take 1 tablet by mouth daily.   Multiple Vitamin (MULTIVITAMIN) capsule Take 1 capsule by mouth daily.   pantoprazole (PROTONIX) 40 MG tablet TAKE 1 TABLET(40 MG) BY MOUTH DAILY   Tdap (BOOSTRIX) 5-2.5-18.5 LF-MCG/0.5 injection Inject 0.5 mLs into the muscle once.   Zoster Vaccine Adjuvanted [facility]) injection Inject into the muscle once.   No facility-administered encounter medications on file as of 01/23/2022.    Surgical History: Past Surgical History:  Procedure Laterality Date   COLONOSCOPY N/A 05/03/2015   Procedure: COLONOSCOPY;  Surgeon: Lucilla Lame, MD;  Location: [place];  Service: Gastroenterology;  Laterality: N/A;   COLONOSCOPY     COLONOSCOPY WITH PROPOFOL N/A 02/22/2021   Procedure: COLONOSCOPY WITH PROPOFOL;   Surgeon: Lucilla Lame, MD;  [facility] ENDOSCOPY;  Service: Endoscopy;  Laterality: N/A;   ESOPHAGOGASTRODUODENOSCOPY N/A 05/03/2015   Procedure: ESOPHAGOGASTRODUODENOSCOPY (EGD);  Surgeon: Lucilla Lame, MD;  Location: [place];  Service: Gastroenterology;  Laterality: N/A;   FOOT SURGERY  2014   Gastric bypass surgery     POLYPECTOMY  05/03/2015   Procedure: POLYPECTOMY INTESTINAL;  Surgeon: Lucilla Lame, MD;  Location: [place];  Service: Gastroenterology;;    Medical History: Past Medical History:  Diagnosis Date   Allergy    seasonal   Anemia    Arthritis    right hip   Headache    sinus/allergies   Hypertension    Seasonal allergies    Vertigo    1-2x/month   Wears contact lenses     Family History: Family History  Problem Relation Age of Onset   Diabetes Mother    Hypertension Mother    Cancer Father    Diabetes Father    Hypertension Father     Social History   Socioeconomic History   Marital status: Single    Spouse name: Not on file   Number of children: Not on file   Years of education: Not on file   Highest education level: Not on file  Occupational History   Not on file  Tobacco Use   Smoking status: Never   Smokeless tobacco: Never  Vaping Use   Vaping Use: Never used  Substance and Sexual Activity   Alcohol use: No  Drug use: No   Sexual activity: Not Currently  Other Topics Concern   Not on file  Social History Narrative   Not on file   Social Determinants of Health   Financial Resource Strain: Not on file  Food Insecurity: Not on file  Transportation Needs: Not on file  Physical Activity: Not on file  Stress: Not on file  Social Connections: Not on file  Intimate Partner Violence: Not on file      Review of Systems  Constitutional:  Negative for activity change, appetite change, chills, fatigue, fever and unexpected weight change.  HENT: Negative.  Negative for congestion, ear pain, rhinorrhea, sore  throat and trouble swallowing.   Eyes: Negative.   Respiratory: Negative.  Negative for cough, chest tightness, shortness of breath and wheezing.   Cardiovascular: Negative.  Negative for chest pain.  Gastrointestinal: Negative.  Negative for abdominal pain, blood in stool, constipation, diarrhea, nausea and vomiting.  Endocrine: Negative.   Genitourinary: Negative.  Negative for difficulty urinating, dysuria, frequency, hematuria and urgency.  Musculoskeletal: Negative.  Negative for arthralgias, back pain, joint swelling, myalgias and neck pain.  Skin: Negative.  Negative for rash and wound.  Allergic/Immunologic: Negative.  Negative for immunocompromised state.  Neurological: Negative.  Negative for dizziness, seizures, numbness and headaches.  Hematological: Negative.   Psychiatric/Behavioral: Negative.  Negative for behavioral problems, self-injury and suicidal ideas. The patient is not nervous/anxious.    Vital Signs: BP 138/68    Pulse 77    Temp 98.5 F (36.9 C)    Resp 16    Ht 5\' 5"  (1.651 m)    Wt (!) 314 lb 9.6 oz (142.7 kg)    LMP 02/11/2017    SpO2 99%    BMI 52.35 kg/m    Physical Exam Vitals reviewed.  Constitutional:      General: She is not in acute distress.    Appearance: She is well-developed. She is not diaphoretic.  HENT:     Head: Normocephalic and atraumatic.     Right Ear: External ear normal.     Left Ear: External ear normal.     Nose: Nose normal.     Mouth/Throat:     Pharynx: No oropharyngeal exudate.  Eyes:     General: No scleral icterus.       Right eye: No discharge.        Left eye: No discharge.     Conjunctiva/sclera: Conjunctivae normal.     Pupils: Pupils are equal, round, and reactive to light.  Neck:     Thyroid: No thyromegaly.     Vascular: No JVD.     Trachea: No tracheal deviation.  Cardiovascular:     Rate and Rhythm: Normal rate and regular rhythm.     Heart sounds: Normal heart sounds. No murmur heard.   No friction rub. No  gallop.  Pulmonary:     Effort: Pulmonary effort is normal. No respiratory distress.     Breath sounds: Normal breath sounds. No stridor. No wheezing or rales.  Chest:     Chest wall: No tenderness.  Abdominal:     General: Bowel sounds are normal. There is no distension.     Palpations: Abdomen is soft. There is no mass.     Tenderness: There is no abdominal tenderness. There is no guarding or rebound.  Musculoskeletal:        General: No tenderness or deformity. Normal range of motion.     Cervical back: Normal range of  motion and neck supple.  Lymphadenopathy:     Cervical: No cervical adenopathy.  Skin:    General: Skin is warm and dry.     Coloration: Skin is not pale.     Findings: No erythema or rash.  Neurological:     Mental Status: She is alert.     Cranial Nerves: No cranial nerve deficit.     Motor: No abnormal muscle tone.     Coordination: Coordination normal.     Deep Tendon Reflexes: Reflexes are normal and symmetric.  Psychiatric:        Behavior: Behavior normal.        Thought Content: Thought content normal.        Judgment: Judgment normal.       Assessment/Plan: 1. Dysuria - UA/M w/rflx Culture, Routine     General Counseling: Larra verbalizes understanding of the findings of todays visit and agrees with plan of treatment. I have discussed any further diagnostic evaluation that may be needed or ordered today. We also reviewed her medications today. she has been encouraged to call the office with any questions or concerns that should arise related to todays visit.    Orders Placed This Encounter  Procedures   UA/M w/rflx Culture, Routine    No orders of the defined types were placed in this encounter.   No follow-ups on file.   Total time spent:*** Minutes Time spent includes review of chart, medications, test results, and follow up plan with the patient.   [place] Controlled Substance Database was reviewed by me.  This patient was seen by  Jonetta Osgood, FNP-C in collaboration with Dr. Clayborn Bigness as a part of collaborative care agreement.  Vondra Aldredge R. Valetta Fuller, MSN, FNP-C Internal medicine

## 2022-01-26 LAB — UA/M W/RFLX CULTURE, ROUTINE
Bilirubin, UA: NEGATIVE
Glucose, UA: NEGATIVE
Nitrite, UA: NEGATIVE
Protein,UA: NEGATIVE
RBC, UA: NEGATIVE
Specific Gravity, UA: 1.025 (ref 1.005–1.030)
Urobilinogen, Ur: 1 mg/dL (ref 0.2–1.0)
pH, UA: 5 (ref 5.0–7.5)

## 2022-01-26 LAB — MICROSCOPIC EXAMINATION
Casts: NONE SEEN /lpf
RBC, Urine: NONE SEEN /hpf (ref 0–2)

## 2022-01-26 LAB — URINE CULTURE, REFLEX

## 2022-02-20 ENCOUNTER — Encounter: Payer: Self-pay | Admitting: Nurse Practitioner

## 2022-02-20 ENCOUNTER — Other Ambulatory Visit: Payer: Self-pay

## 2022-02-20 ENCOUNTER — Ambulatory Visit: Payer: 59 | Admitting: Nurse Practitioner

## 2022-02-20 VITALS — BP 140/71 | HR 86 | Temp 98.2°F | Resp 16 | Ht 65.0 in | Wt 313.0 lb

## 2022-02-20 DIAGNOSIS — K219 Gastro-esophageal reflux disease without esophagitis: Secondary | ICD-10-CM

## 2022-02-20 DIAGNOSIS — I10 Essential (primary) hypertension: Secondary | ICD-10-CM | POA: Diagnosis not present

## 2022-02-20 DIAGNOSIS — M25521 Pain in right elbow: Secondary | ICD-10-CM

## 2022-02-20 MED ORDER — WEGOVY 0.5 MG/0.5ML ~~LOC~~ SOAJ: 0.5000 mg | SUBCUTANEOUS | 3 refills | Status: DC

## 2022-02-20 NOTE — Progress Notes (Signed)
[facility] ?[address] ?[place], [place] [address] ? ?Internal MEDICINE  ?Office Visit Note ? ?Patient Name: Stephanie Gates ? [id number]  [id number] ? ?Date of Service: 02/21/2022 ? ?Chief Complaint  ?Patient presents with  ? Follow-up  ? Hypertension  ? Arm Pain  ?  Right middle arm pain - started 2 weeks ago, hurts to move  ? ? ?HPI ?[name] presents for follow-up visit for weight loss management, hypertension and elbow pain on the right side.  Patient has had baseline labs drawn and today will be doing the metabolic test in the office.  For the metabolic test, her recommended calorie intake for weight loss is 1832 cal to 2290 cal.  She started [facility] after her last office visit and is only lost about 1 pound on the 0.25 mg dose but she has started noticing a decrease in her appetite and not wanting to finish entire meals or eat more food.  She is working on her diet and being more active. ?She has been having right elbow pain x2 weeks.  She reports that it is sore and hurts to bend.  She reports that Tylenol helps alleviate the pain.  She reports that she is often typing and using the computer mouse with her right hand.  These repetitive movements could be causing her elbow to feel sore when it is bent in the same position for periods of time. ? ? ?Current Medication: ?Outpatient Encounter Medications as of 02/20/2022  ?Medication Sig  ? amLODipine (NORVASC) 5 MG tablet Take 1 tablet (5 mg total) by mouth daily.  ? Cyanocobalamin (VITAMIN B-12 PO) Take by mouth.  ? cyclobenzaprine (FLEXERIL) 10 MG tablet Take 1 tablet (10 mg total) by mouth at bedtime. Take one tab po qhs for back spasm prn only  ? hydrochlorothiazide (HYDRODIURIL) 12.5 MG tablet Take 1 tablet (12.5 mg total) by mouth daily.  ? Multiple Vitamin (MULTIVITAMIN) capsule Take 1 capsule by mouth daily.  ? pantoprazole (PROTONIX) 40 MG tablet TAKE 1 TABLET(40 MG) BY MOUTH DAILY  ? Semaglutide-Weight Management (WEGOVY) 0.5 MG/0.5ML SOAJ  Inject 0.5 mg into the skin once a week.  ? ?No facility-administered encounter medications on file as of 02/20/2022.  ? ? ?Surgical History: ?Past Surgical History:  ?Procedure Laterality Date  ? COLONOSCOPY N/A 05/03/2015  ? Procedure: COLONOSCOPY;  Surgeon: Lucilla Lame, MD;  Location: [place];  Service: Gastroenterology;  Laterality: N/A;  ? COLONOSCOPY    ? COLONOSCOPY WITH PROPOFOL N/A 02/22/2021  ? Procedure: COLONOSCOPY WITH PROPOFOL;  Surgeon: Lucilla Lame, MD;  [facility] ENDOSCOPY;  Service: Endoscopy;  Laterality: N/A;  ? ESOPHAGOGASTRODUODENOSCOPY N/A 05/03/2015  ? Procedure: ESOPHAGOGASTRODUODENOSCOPY (EGD);  Surgeon: Lucilla Lame, MD;  Location: [place];  Service: Gastroenterology;  Laterality: N/A;  ? FOOT SURGERY  2014  ? Gastric bypass surgery    ? POLYPECTOMY  05/03/2015  ? Procedure: POLYPECTOMY INTESTINAL;  Surgeon: Lucilla Lame, MD;  Location: [place];  Service: Gastroenterology;;  ? ? ?Medical History: ?Past Medical History:  ?Diagnosis Date  ? Allergy   ? seasonal  ? Anemia   ? 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 Headache   ? sinus/allergies  ? Hypertension   ? Seasonal allergies   ? Vertigo   ? 1-2x/month  ? Wears contact lenses   ? ? ?Family History: ?Family History  ?Problem Relation Age of Onset  ? Diabetes Mother   ? Hypertension Mother   ? Cancer Father   ?  Diabetes Father   ? Hypertension Father   ? ? ?Social History  ? ?Socioeconomic History  ? Marital status: Single  ?  Spouse name: Not on file  ? Number of children: Not on file  ? Years of education: Not on file  ? Highest education level: Not on file  ?Occupational History  ? Not on file  ?Tobacco Use  ? Smoking status: Never  ? Smokeless tobacco: Never  ?Vaping Use  ? Vaping Use: Never used  ?Substance and Sexual Activity  ? Alcohol use: No  ? Drug use: No  ? Sexual activity: Not Currently  ?Other Topics Concern  ? Not on file  ?Social History Narrative  ? Not on file  ? ?Social Determinants of Health   ? ?Financial Resource Strain: Not on file  ?Food Insecurity: Not on file  ?Transportation Needs: Not on file  ?Physical Activity: Not on file  ?Stress: Not on file  ?Social Connections: Not on file  ?Intimate Partner Violence: Not on file  ? ? ? ? ?Review of Systems  ?Constitutional:  Negative for chills, fatigue and unexpected weight change.  ?HENT:  Negative for congestion, rhinorrhea, sneezing and sore throat.   ?Eyes:  Negative for redness.  ?Respiratory:  Negative for cough, chest tightness and shortness of breath.   ?Cardiovascular:  Negative for chest pain and palpitations.  ?Gastrointestinal:  Negative for abdominal pain, constipation, diarrhea, nausea and vomiting.  ?Genitourinary:  Negative for dysuria and frequency.  ?Musculoskeletal:  Negative for arthralgias, back pain, joint swelling and neck pain.  ?Skin:  Negative for rash.  ?Neurological: Negative.  Negative for tremors and numbness.  ?Hematological:  Negative for adenopathy. Does not bruise/bleed easily.  ?Psychiatric/Behavioral:  Negative for behavioral problems (Depression), sleep disturbance and suicidal ideas. The patient is not nervous/anxious.   ? ?Vital Signs: ?BP 140/71   Pulse 86   Temp 98.2 ?F (36.8 ?C)   Resp 16   Ht '5\' 5"'$  (1.651 m)   Wt (!) 313 lb (142 kg)   LMP 02/11/2017   SpO2 99%   BMI 52.09 kg/m?  ? ? ?Physical Exam ?Vitals reviewed.  ?Constitutional:   ?   General: She is not in acute distress. ?   Appearance: Normal appearance. She is obese. She is not ill-appearing.  ?HENT:  ?   Head: Normocephalic and atraumatic.  ?Eyes:  ?   Pupils: Pupils are equal, round, and reactive to light.  ?Cardiovascular:  ?   Rate and Rhythm: Normal rate and regular rhythm.  ?Pulmonary:  ?   Effort: Pulmonary effort is normal. No respiratory distress.  ?Neurological:  ?   Mental Status: She is alert and oriented to person, place, and time.  ?Psychiatric:     ?   Mood and Affect: Mood normal.     ?   Behavior: Behavior normal.   ? ? ? ? ? ?Assessment/Plan: ?1. Right elbow pain ?May continue tylenol as needed and alternate with ibuprofen 600-800 mg as needed every 8 hours. May apply ice or heat and take rest breaks to alleviate pain ? ?2. Essential hypertension ?Stable, continue medications as prescribed.  ? ?3. Gastro-esophageal reflux disease without esophagitis ?Stable and well controlled with current medication.  ? ?4. Morbid obesity with BMI of 50.0-59.9, adult ([place]) ?Recommended daily caloric intake is 1832-2290 per day and her metabolism is faster than normal. Will continue wegovy, prescription sent to pharmacy.  ?Obesity Counseling: ?Risk Assessment: An assessment of behavioral risk factors was made today and  includes lack of exercise sedentary lifestyle, lack of portion control and poor dietary habits. The patient has been screened for diabetes and a metabolic test to determine basal metabolic rate has been performed.  ? ?Risk Modification Advice: The patient was counseled on portion control guidelines. Safe recommendations on caloric restriction discussed with patient. Sample meals plans were provided. General guidelines on diet and lifestyle modifications were discussed at length. Introducing physical activity as tolerated and at the patient's ability level is recommended.  ?- Semaglutide-Weight Management (WEGOVY) 0.5 MG/0.5ML SOAJ; Inject 0.5 mg into the skin once a week.  Dispense: 2 mL; Refill: 3 ?- Metabolic Test ? ? ?General Counseling: ling flesch understanding of the findings of todays visit and agrees with plan of treatment. I have discussed any further diagnostic evaluation that may be needed or ordered today. We also reviewed her medications today. she has been encouraged to call the office with any questions or concerns that should arise related to todays visit. ? ? ? ?Orders Placed This Encounter  ?Procedures  ? Metabolic Test  ? ? ?Meds ordered this encounter  ?Medications  ? Semaglutide-Weight Management  (WEGOVY) 0.5 MG/0.5ML SOAJ  ?  Sig: Inject 0.5 mg into the skin once a week.  ?  Dispense:  2 mL  ?  Refill:  3  ?  Dx code E66.01  ? ? ?Return in about 4 weeks (around 03/20/2022) for F/U, Weight loss, Harmonee Tozer PCP. ? ? ?Total time spe

## 2022-02-21 ENCOUNTER — Encounter: Payer: Self-pay | Admitting: Nurse Practitioner

## 2022-02-22 ENCOUNTER — Encounter: Payer: Self-pay | Admitting: Nurse Practitioner

## 2022-02-24 ENCOUNTER — Telehealth: Payer: Self-pay

## 2022-02-24 NOTE — Telephone Encounter (Signed)
PA approved for [facility] [id number] 02/24/22 and valid to 09/26/22 ?

## 2022-02-24 NOTE — Telephone Encounter (Signed)
PA for WEGOVY 0.5 mg/0.5 ml sent 02/24/22 @ 620 pm  ?

## 2022-03-02 ENCOUNTER — Encounter: Payer: Self-pay | Admitting: Nurse Practitioner

## 2022-03-20 ENCOUNTER — Ambulatory Visit: Payer: 59 | Admitting: Nurse Practitioner

## 2022-03-20 ENCOUNTER — Encounter: Payer: Self-pay | Admitting: Nurse Practitioner

## 2022-03-20 VITALS — BP 138/77 | HR 94 | Temp 97.6°F | Resp 16 | Ht 65.0 in | Wt 310.2 lb

## 2022-03-20 DIAGNOSIS — I10 Essential (primary) hypertension: Secondary | ICD-10-CM | POA: Diagnosis not present

## 2022-03-20 DIAGNOSIS — M25521 Pain in right elbow: Secondary | ICD-10-CM | POA: Diagnosis not present

## 2022-03-20 DIAGNOSIS — Z6841 Body Mass Index (BMI) 40.0 and over, adult: Secondary | ICD-10-CM | POA: Diagnosis not present

## 2022-03-20 MED ORDER — WEGOVY 0.5 MG/0.5ML ~~LOC~~ SOAJ
0.5000 mg | SUBCUTANEOUS | 3 refills | Status: DC
Start: 2022-03-20 — End: 2022-04-22

## 2022-03-20 NOTE — Progress Notes (Signed)
[facility] ?[address] ?[place], [place] [address] ? ?Internal MEDICINE  ?Office Visit Note ? ?Patient Name: Stephanie Gates ? [id number]  ?[id number] ? ?Date of Service: 03/20/2022 ? ?Chief Complaint  ?Patient presents with  ? Follow-up  ? Hypertension  ? ? ?HPI ?Emalina presents for a follow up visit for hypertension and weight loss management. She was started on wegovy once weekly injection. A sample was given and she was prescription the next dose strength for her first fill. She has lost 3 lbs according to the scale in the clinic but approximately 5 lbs this morning on her scale at home. She denies any adverse effects of the medication and is tolerating the current dose well.  ?Her blood pressure is well controlled and her other vital signs are within normal limits. ? ? ? ?Current Medication: ?Outpatient Encounter Medications as of 03/20/2022  ?Medication Sig  ? amLODipine (NORVASC) 5 MG tablet Take 1 tablet (5 mg total) by mouth daily.  ? Cyanocobalamin (VITAMIN B-12 PO) Take by mouth.  ? cyclobenzaprine (FLEXERIL) 10 MG tablet Take 1 tablet (10 mg total) by mouth at bedtime. Take one tab po qhs for back spasm prn only  ? hydrochlorothiazide (HYDRODIURIL) 12.5 MG tablet Take 1 tablet (12.5 mg total) by mouth daily.  ? Multiple Vitamin (MULTIVITAMIN) capsule Take 1 capsule by mouth daily.  ? pantoprazole (PROTONIX) 40 MG tablet TAKE 1 TABLET(40 MG) BY MOUTH DAILY  ? [DISCONTINUED] Semaglutide-Weight Management (WEGOVY) 0.5 MG/0.5ML SOAJ Inject 0.5 mg into the skin once a week.  ? Semaglutide-Weight Management (WEGOVY) 0.5 MG/0.5ML SOAJ Inject 0.5 mg into the skin once a week.  ? ?No facility-administered encounter medications on file as of 03/20/2022.  ? ? ?Surgical History: ?Past Surgical History:  ?Procedure Laterality Date  ? COLONOSCOPY N/A 05/03/2015  ? Procedure: COLONOSCOPY;  Surgeon: Lucilla Lame, MD;  Location: [facility];  Service: Gastroenterology;  Laterality: N/A;  ? COLONOSCOPY     ? COLONOSCOPY WITH PROPOFOL N/A 02/22/2021  ? Procedure: COLONOSCOPY WITH PROPOFOL;  Surgeon: Lucilla Lame, MD;  Location: [facility] ENDOSCOPY;  Service: Endoscopy;  Laterality: N/A;  ? ESOPHAGOGASTRODUODENOSCOPY N/A 05/03/2015  ? Procedure: ESOPHAGOGASTRODUODENOSCOPY (EGD);  Surgeon: Lucilla Lame, MD;  Location: [facility];  Service: Gastroenterology;  Laterality: N/A;  ? FOOT SURGERY  2014  ? Gastric bypass surgery    ? POLYPECTOMY  05/03/2015  ? Procedure: POLYPECTOMY INTESTINAL;  Surgeon: Lucilla Lame, MD;  Location: [facility];  Service: Gastroenterology;;  ? ? ?Medical History: ?Past Medical History:  ?Diagnosis Date  ? Allergy   ? seasonal  ? Anemia   ? 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 Headache   ? sinus/allergies  ? Hypertension   ? Seasonal allergies   ? Vertigo   ? 1-2x/month  ? Wears contact lenses   ? ? ?Family History: ?Family History  ?Problem Relation Age of Onset  ? Diabetes Mother   ? Hypertension Mother   ? Cancer Father   ? Diabetes Father   ? Hypertension Father   ? ? ?Social History  ? ?Socioeconomic History  ? Marital status: Single  ?  Spouse name: Not on file  ? Number of children: Not on file  ? Years of education: Not on file  ? Highest education level: Not on file  ?Occupational History  ? Not on file  ?Tobacco Use  ? Smoking status: Never  ? Smokeless tobacco: Never  ?Vaping Use  ? Vaping Use: Never used  ?  Substance and Sexual Activity  ? Alcohol use: No  ? Drug use: No  ? Sexual activity: Not Currently  ?Other Topics Concern  ? Not on file  ?Social History Narrative  ? Not on file  ? ?Social Determinants of Health  ? ?Financial Resource Strain: Not on file  ?Food Insecurity: Not on file  ?Transportation Needs: Not on file  ?Physical Activity: Not on file  ?Stress: Not on file  ?Social Connections: Not on file  ?Intimate Partner Violence: Not on file  ? ? ? ? ?Review of Systems  ?Constitutional:  Negative for chills, fatigue and unexpected weight change.  ?HENT:  Negative for  congestion, rhinorrhea, sneezing and sore throat.   ?Eyes:  Negative for redness.  ?Respiratory: Negative.  Negative for cough, chest tightness, shortness of breath and wheezing.   ?Cardiovascular: Negative.  Negative for chest pain and palpitations.  ?Gastrointestinal:  Negative for abdominal pain, constipation, diarrhea, nausea and vomiting.  ?Genitourinary:  Negative for dysuria and frequency.  ?Musculoskeletal:  Negative for arthralgias, back pain, joint swelling and neck pain.  ?Skin:  Negative for rash.  ?Neurological: Negative.  Negative for tremors and numbness.  ?Hematological:  Negative for adenopathy. Does not bruise/bleed easily.  ?Psychiatric/Behavioral:  Negative for behavioral problems (Depression), sleep disturbance and suicidal ideas. The patient is not nervous/anxious.   ?All other systems reviewed and are negative. ? ?Vital Signs: ?BP 138/77   Pulse 94   Temp 97.6 ?F (36.4 ?C)   Resp 16   Ht '5\' 5"'$  (1.651 m)   Wt (!) 310 lb 3.2 oz (140.7 kg)   LMP 02/11/2017   SpO2 97%   BMI 51.62 kg/m?  ? ? ?Physical Exam ?Vitals reviewed.  ?Constitutional:   ?   General: She is not in acute distress. ?   Appearance: Normal appearance. She is obese. She is not ill-appearing.  ?HENT:  ?   Head: Normocephalic and atraumatic.  ?Eyes:  ?   Pupils: Pupils are equal, round, and reactive to light.  ?Cardiovascular:  ?   Rate and Rhythm: Normal rate and regular rhythm.  ?Pulmonary:  ?   Effort: Pulmonary effort is normal. No respiratory distress.  ?Neurological:  ?   Mental Status: She is alert and oriented to person, place, and time.  ?Psychiatric:     ?   Mood and Affect: Mood normal.     ?   Behavior: Behavior normal.  ? ? ? ? ? ?Assessment/Plan: ?1. Essential hypertension ?Stable with current medication ? ?2. Right elbow pain ?Still present, slightly improved, will contact clinic if she wants a referral to orthopedic specialist.  ? ?3. Morbid obesity with BMI of 50.0-59.9, adult ([place]) ?Refills of wegovy sent  to pharmacy. She has lost 3-5 lbs since starting the new medication. Follow up in 1 month.  ?- Semaglutide-Weight Management (WEGOVY) 0.5 MG/0.5ML SOAJ; Inject 0.5 mg into the skin once a week.  Dispense: 2 mL; Refill: 3 ? ? ?General Counseling: jeniece hannis understanding of the findings of todays visit and agrees with plan of treatment. I have discussed any further diagnostic evaluation that may be needed or ordered today. We also reviewed her medications today. she has been encouraged to call the office with any questions or concerns that should arise related to todays visit,  ? ? ? ?No orders of the defined types were placed in this encounter. ? ? ?Meds ordered this encounter  ?Medications  ? Semaglutide-Weight Management (WEGOVY) 0.5 MG/0.5ML SOAJ  ?  Sig: Inject  0.5 mg into the skin once a week.  ?  Dispense:  2 mL  ?  Refill:  3  ?  Dx code E66.01  ? ? ?Return in about 1 month (around 04/19/2022) for F/U, Weight loss, Trice Aspinall PCP. ? ? ?Total time spent:30 Minutes ?Time spent includes review of chart, medications, test results, and follow up plan with the patient.  ? ?[place] Controlled Substance Database was reviewed by me. ? ?This patient was seen by Jonetta Osgood, FNP-C in collaboration with Dr. Clayborn Bigness as a part of collaborative care agreement. ? ? ?Annie Roseboom R. Valetta Fuller, MSN, FNP-C ?Internal medicine  ?

## 2022-03-28 ENCOUNTER — Encounter: Payer: Self-pay | Admitting: Nurse Practitioner

## 2022-04-16 LAB — CBC WITH DIFFERENTIAL/PLATELET
Basophils Absolute: 0 10*3/uL (ref 0.0–0.2)
Basos: 0 %
EOS (ABSOLUTE): 0.1 10*3/uL (ref 0.0–0.4)
Eos: 1 %
Hematocrit: 38.6 % (ref 34.0–46.6)
Hemoglobin: 12 g/dL (ref 11.1–15.9)
Immature Grans (Abs): 0 10*3/uL (ref 0.0–0.1)
Immature Granulocytes: 0 %
Lymphocytes Absolute: 3.9 10*3/uL — ABNORMAL HIGH (ref 0.7–3.1)
Lymphs: 32 %
MCH: 23.4 pg — ABNORMAL LOW (ref 26.6–33.0)
MCHC: 31.1 g/dL — ABNORMAL LOW (ref 31.5–35.7)
MCV: 75 fL — ABNORMAL LOW (ref 79–97)
Monocytes Absolute: 1 10*3/uL — ABNORMAL HIGH (ref 0.1–0.9)
Monocytes: 8 %
Neutrophils Absolute: 7.1 10*3/uL — ABNORMAL HIGH (ref 1.4–7.0)
Neutrophils: 59 %
Platelets: 384 10*3/uL (ref 150–450)
RBC: 5.12 x10E6/uL (ref 3.77–5.28)
RDW: 15.5 % — ABNORMAL HIGH (ref 11.7–15.4)
WBC: 12.1 10*3/uL — ABNORMAL HIGH (ref 3.4–10.8)

## 2022-04-16 LAB — VITAMIN D 25 HYDROXY (VIT D DEFICIENCY, FRACTURES): Vit D, 25-Hydroxy: 22.4 ng/mL — ABNORMAL LOW (ref 30.0–100.0)

## 2022-04-16 LAB — B12 AND FOLATE PANEL
Folate: 15.5 ng/mL (ref >3.0)
Vitamin B-12: >2000 pg/mL — ABNORMAL HIGH (ref 232–1245)

## 2022-04-16 LAB — CMP14+EGFR
ALT: 15 IU/L (ref 0–32)
AST: 19 IU/L (ref 0–40)
Albumin/Globulin Ratio: 1.4 (ref 1.2–2.2)
Albumin: 4 g/dL (ref 3.8–4.9)
Alkaline Phosphatase: 101 IU/L (ref 44–121)
BUN/Creatinine Ratio: 9 (ref 9–23)
BUN: 9 mg/dL (ref 6–24)
Bilirubin Total: 0.5 mg/dL (ref 0.0–1.2)
CO2: 24 mmol/L (ref 20–29)
Calcium: 9.8 mg/dL (ref 8.7–10.2)
Chloride: 103 mmol/L (ref 96–106)
Creatinine, Ser: 0.97 mg/dL (ref 0.57–1.00)
Globulin, Total: 2.8 g/dL (ref 1.5–4.5)
Glucose: 94 mg/dL (ref 70–99)
Potassium: 3.3 mmol/L — ABNORMAL LOW (ref 3.5–5.2)
Sodium: 142 mmol/L (ref 134–144)
Total Protein: 6.8 g/dL (ref 6.0–8.5)
eGFR: 69 mL/min/{1.73_m2} (ref >59)

## 2022-04-16 LAB — LIPID PANEL
Chol/HDL Ratio: 2.8 ratio (ref 0.0–4.4)
Cholesterol, Total: 147 mg/dL (ref 100–199)
HDL: 52 mg/dL (ref >39)
LDL Chol Calc (NIH): 78 mg/dL (ref 0–99)
Triglycerides: 91 mg/dL (ref 0–149)
VLDL Cholesterol Cal: 17 mg/dL (ref 5–40)

## 2022-04-16 LAB — IRON,TIBC AND FERRITIN PANEL
Ferritin: 20 ng/mL (ref 15–150)
Iron Saturation: 6 % — CL (ref 15–55)
Iron: 27 ug/dL (ref 27–159)
Total Iron Binding Capacity: 422 ug/dL (ref 250–450)
UIBC: 395 ug/dL (ref 131–425)

## 2022-04-16 LAB — TSH+FREE T4
Free T4: 1.46 ng/dL (ref 0.82–1.77)
TSH: 3.68 u[IU]/mL (ref 0.450–4.500)

## 2022-04-18 ENCOUNTER — Telehealth: Payer: Self-pay

## 2022-04-18 NOTE — Telephone Encounter (Signed)
Error

## 2022-04-22 ENCOUNTER — Telehealth: Payer: Self-pay

## 2022-04-22 DIAGNOSIS — E876 Hypokalemia: Secondary | ICD-10-CM

## 2022-04-22 DIAGNOSIS — D509 Iron deficiency anemia, unspecified: Secondary | ICD-10-CM

## 2022-04-22 DIAGNOSIS — R7301 Impaired fasting glucose: Secondary | ICD-10-CM

## 2022-04-22 NOTE — Telephone Encounter (Signed)
LMOM to review results ?

## 2022-04-22 NOTE — Telephone Encounter (Signed)
-----   Message from Jonetta Osgood, NP sent at 04/19/2022  4:24 PM EDT ----- ?Please call the patient and let her know her results: ?-- CBC and iron panel do show iron deficiency without anemia at this time.  CBC also shows elevated white blood cell count which is persistent when reviewing previous labs.  Multiple types of white blood cells are elevated which may be a reaction to inflammation or infection.  Recommend repeating CBC in 1 to 2 months to assess for changes in white blood cell levels. ?--Metabolic panel is grossly normal except for a slightly low potassium level.  Recommendation is to ensure patient is eating potassium rich foods such as bananas, potatoes and other options. ?--Vitamin D level is slightly low at 22.4, recommend taking 5000 units of vitamin D daily over-the-counter.  If not already doing so. ?--Cholesterol panel is normal and cholesterol/HDL ratio is 2.8 which is less than half the average risk for developing cardiovascular disease. ?--Thyroid levels are normal. ?

## 2022-04-22 NOTE — Telephone Encounter (Signed)
error 

## 2022-05-01 ENCOUNTER — Other Ambulatory Visit: Payer: Self-pay | Admitting: Nurse Practitioner

## 2022-05-01 MED ORDER — SEMAGLUTIDE-WEIGHT MANAGEMENT 1 MG/0.5ML ~~LOC~~ SOAJ: 1.0000 mg | SUBCUTANEOUS | 2 refills | Status: DC

## 2022-05-02 NOTE — Telephone Encounter (Signed)
LMOM labs ordered and meds sent to pharmacy

## 2022-05-22 ENCOUNTER — Ambulatory Visit: Payer: 59 | Admitting: Nurse Practitioner

## 2022-05-22 ENCOUNTER — Encounter: Payer: Self-pay | Admitting: Nurse Practitioner

## 2022-05-22 VITALS — BP 130/73 | HR 94 | Temp 98.3°F | Resp 16 | Ht 65.0 in | Wt 301.0 lb

## 2022-05-22 DIAGNOSIS — G4762 Sleep related leg cramps: Secondary | ICD-10-CM

## 2022-05-22 DIAGNOSIS — Z6841 Body Mass Index (BMI) 40.0 and over, adult: Secondary | ICD-10-CM

## 2022-05-22 DIAGNOSIS — Z23 Encounter for immunization: Secondary | ICD-10-CM | POA: Diagnosis not present

## 2022-05-22 DIAGNOSIS — I10 Essential (primary) hypertension: Secondary | ICD-10-CM

## 2022-05-22 MED ORDER — ZOSTER VAC RECOMB ADJUVANTED 50 MCG/0.5ML IM SUSR: 0.5000 mL | Freq: Once | INTRAMUSCULAR | 0 refills | Status: AC

## 2022-05-22 MED ORDER — TETANUS-DIPHTH-ACELL PERTUSSIS 5-2.5-18.5 LF-MCG/0.5 IM SUSP: 0.5000 mL | Freq: Once | INTRAMUSCULAR | 0 refills | Status: AC

## 2022-05-22 NOTE — Progress Notes (Signed)
[facility] [place], [place] [address]  Internal MEDICINE  Office Visit Note  Patient Name: Stephanie Gates  [id number]  [id number]  Date of Service: 05/22/2022  Chief Complaint  Patient presents with   Follow-up    Shortage on wegovy, has not had med in a month, cramp in right leg last night, hurts like she will get a cramp for 2 to 3 weeks but only had one cramp last night   Hypertension   Anemia   Weight Loss    HPI Chalise presents for a follow up visit for obesity, weight loss management, nocturnal leg cramps, and hypertension.  She has been using Wegovy weekly injections to help aid in weight loss.  She has lost 9 pounds since her previous office visit but has not been able to take her injection for the past 2 weeks due to the pharmacy having difficulty keeping the medication in stock.  Blood pressure is stable and well-controlled with current medications. Patient also reports having leg cramps at night in her lower extremities and has tried taking over-the-counter potassium supplement but this has not been helping and would like to know what else she can do.    Current Medication: Outpatient Encounter Medications as of 05/22/2022  Medication Sig   amLODipine (NORVASC) 5 MG tablet Take 1 tablet (5 mg total) by mouth daily.   Cyanocobalamin (VITAMIN B-12 PO) Take by mouth.   cyclobenzaprine (FLEXERIL) 10 MG tablet Take 1 tablet (10 mg total) by mouth at bedtime. Take one tab po qhs for back spasm prn only   hydrochlorothiazide (HYDRODIURIL) 12.5 MG tablet Take 1 tablet (12.5 mg total) by mouth daily.   Multiple Vitamin (MULTIVITAMIN) capsule Take 1 capsule by mouth daily.   pantoprazole (PROTONIX) 40 MG tablet TAKE 1 TABLET(40 MG) BY MOUTH DAILY   Semaglutide-Weight Management 1 MG/0.5ML SOAJ Inject 1 mg into the skin once a week.   [DISCONTINUED] Tdap (BOOSTRIX) 5-2.5-18.5 LF-MCG/0.5 injection Inject 0.5 mLs into the muscle once.   [DISCONTINUED]  Zoster Vaccine Adjuvanted [facility]) injection Inject 0.5 mLs into the muscle once.   Tdap (BOOSTRIX) 5-2.5-18.5 LF-MCG/0.5 injection Inject 0.5 mLs into the muscle once for 1 dose.   Zoster Vaccine Adjuvanted [facility]) injection Inject 0.5 mLs into the muscle once for 1 dose.   No facility-administered encounter medications on file as of 05/22/2022.    Surgical History: Past Surgical History:  Procedure Laterality Date   COLONOSCOPY N/A 05/03/2015   Procedure: COLONOSCOPY;  Surgeon: Lucilla Lame, MD;  Location: [facility];  Service: Gastroenterology;  Laterality: N/A;   COLONOSCOPY     COLONOSCOPY WITH PROPOFOL N/A 02/22/2021   Procedure: COLONOSCOPY WITH PROPOFOL;  Surgeon: Lucilla Lame, MD;  Location: [facility] ENDOSCOPY;  Service: Endoscopy;  Laterality: N/A;   ESOPHAGOGASTRODUODENOSCOPY N/A 05/03/2015   Procedure: ESOPHAGOGASTRODUODENOSCOPY (EGD);  Surgeon: Lucilla Lame, MD;  Location: [facility];  Service: Gastroenterology;  Laterality: N/A;   FOOT SURGERY  2014   Gastric bypass surgery     POLYPECTOMY  05/03/2015   Procedure: POLYPECTOMY INTESTINAL;  Surgeon: Lucilla Lame, MD;  Location: [facility];  Service: Gastroenterology;;    Medical History: Past Medical History:  Diagnosis Date   Allergy    seasonal   Anemia    Arthritis    right hip   Headache    sinus/allergies   Hypertension    Seasonal allergies    Vertigo    1-2x/month   Wears contact lenses  Family History: Family History  Problem Relation Age of Onset   Diabetes Mother    Hypertension Mother    Cancer Father    Diabetes Father    Hypertension Father     Social History   Socioeconomic History   Marital status: Single    Spouse name: Not on file   Number of children: Not on file   Years of education: Not on file   Highest education level: Not on file  Occupational History   Not on file  Tobacco Use   Smoking status: Never   Smokeless tobacco: Never  Vaping Use    Vaping Use: Never used  Substance and Sexual Activity   Alcohol use: No   Drug use: No   Sexual activity: Not Currently  Other Topics Concern   Not on file  Social History Narrative   Not on file   Social Determinants of Health   Financial Resource Strain: Not on file  Food Insecurity: Not on file  Transportation Needs: Not on file  Physical Activity: Not on file  Stress: Not on file  Social Connections: Not on file  Intimate Partner Violence: Not on file      Review of Systems  Constitutional:  Negative for chills, fatigue (denies fatigue, this has improved since starting the prescription vitamin D supplement.) and unexpected weight change.  HENT:  Negative for congestion, rhinorrhea, sneezing and sore throat.   Eyes:  Negative for redness.  Respiratory:  Negative for cough, chest tightness and shortness of breath.   Cardiovascular:  Negative for chest pain and palpitations.  Gastrointestinal:  Negative for abdominal pain, constipation, diarrhea, nausea and vomiting.  Genitourinary:  Negative for dysuria and frequency.  Musculoskeletal:  Negative for arthralgias, back pain, joint swelling and neck pain.  Skin:  Negative for rash.  Neurological: Negative.  Negative for tremors and numbness.  Hematological:  Negative for adenopathy. Does not bruise/bleed easily.  Psychiatric/Behavioral:  Negative for behavioral problems (Depression), sleep disturbance and suicidal ideas. The patient is not nervous/anxious.     Vital Signs: BP 130/73   Pulse 94   Temp 98.3 F (36.8 C)   Resp 16   Ht '5\' 5"'$  (1.651 m)   Wt (!) 301 lb (136.5 kg)   LMP 02/11/2017   SpO2 98%   BMI 50.09 kg/m    Physical Exam Vitals reviewed.  Constitutional:      General: She is not in acute distress.    Appearance: Normal appearance. She is obese. She is not ill-appearing.  HENT:     Head: Normocephalic and atraumatic.  Eyes:     Pupils: Pupils are equal, round, and reactive to light.   Cardiovascular:     Rate and Rhythm: Normal rate and regular rhythm.  Pulmonary:     Effort: Pulmonary effort is normal. No respiratory distress.  Musculoskeletal:     Comments: Leg cramps  Neurological:     Mental Status: She is alert and oriented to person, place, and time.  Psychiatric:        Mood and Affect: Mood normal.        Behavior: Behavior normal.        Assessment/Plan: 1. Essential hypertension Stable and well-controlled with current medications  2. Nocturnal leg cramps Recommended that patient try over-the-counter magnesium oxide 400 mg daily at bedtime to help prevent leg cramps.  When having a leg cramp, recommended that patient take over-the-counter Benadryl 12.5 mg to 25 mg.  If neither of these choices  provide any of relief, will discuss prescription options at a future office visit patient acknowledged that she will call the clinic if necessary.  3. Morbid obesity with BMI of 50.0-59.9, adult [facility]) Patient has lost 9 pounds since her previous office visit and wants to continue Wegovy, sample provided for 4 weeks while the patient is having difficulty getting the medication from the pharmacy due to supply issues.  We will follow-up in 2 months for weight loss management.  4. Need for vaccination - Zoster Vaccine Adjuvanted [facility]) injection; Inject 0.5 mLs into the muscle once for 1 dose.  Dispense: 0.5 mL; Refill: 0 - Tdap (BOOSTRIX) 5-2.5-18.5 LF-MCG/0.5 injection; Inject 0.5 mLs into the muscle once for 1 dose.  Dispense: 0.5 mL; Refill: 0   General Counseling: Shawnette verbalizes understanding of the findings of todays visit and agrees with plan of treatment. I have discussed any further diagnostic evaluation that may be needed or ordered today. We also reviewed her medications today. she has been encouraged to call the office with any questions or concerns that should arise related to todays visit.    No orders of the defined types were placed in this  encounter.   Meds ordered this encounter  Medications   Zoster Vaccine Adjuvanted [facility]) injection    Sig: Inject 0.5 mLs into the muscle once for 1 dose.    Dispense:  0.5 mL    Refill:  0   Tdap (BOOSTRIX) 5-2.5-18.5 LF-MCG/0.5 injection    Sig: Inject 0.5 mLs into the muscle once for 1 dose.    Dispense:  0.5 mL    Refill:  0    Return in about 2 months (around 07/22/2022) for F/U, Weight loss, Josiah Wojtaszek PCP.   Total time spent:30 Minutes Time spent includes review of chart, medications, test results, and follow up plan with the patient.   [place] Controlled Substance Database was reviewed by me.  This patient was seen by Jonetta Osgood, FNP-C in collaboration with Dr. Clayborn Bigness as a part of collaborative care agreement.   Darry Kelnhofer R. Valetta Fuller, MSN, FNP-C Internal medicine

## 2022-07-03 ENCOUNTER — Telehealth: Payer: Self-pay

## 2022-07-03 NOTE — Telephone Encounter (Signed)
LMOM about dose increase and to check [facility] to make sure they have it before I send med

## 2022-07-10 NOTE — Telephone Encounter (Signed)
LMOM returning pts call, asked her to check with other pharmacies for [facility]

## 2022-07-11 ENCOUNTER — Ambulatory Visit: Payer: 59 | Admitting: Dermatology

## 2022-07-17 ENCOUNTER — Other Ambulatory Visit: Payer: Self-pay | Admitting: Nurse Practitioner

## 2022-07-17 DIAGNOSIS — I10 Essential (primary) hypertension: Secondary | ICD-10-CM

## 2022-07-24 ENCOUNTER — Encounter: Payer: Self-pay | Admitting: Nurse Practitioner

## 2022-07-24 ENCOUNTER — Ambulatory Visit (INDEPENDENT_AMBULATORY_CARE_PROVIDER_SITE_OTHER): Payer: 59 | Admitting: Nurse Practitioner

## 2022-07-24 VITALS — BP 120/70 | HR 92 | Temp 98.6°F | Resp 16 | Ht 65.0 in | Wt 298.8 lb

## 2022-07-24 DIAGNOSIS — I10 Essential (primary) hypertension: Secondary | ICD-10-CM

## 2022-07-24 DIAGNOSIS — Z6841 Body Mass Index (BMI) 40.0 and over, adult: Secondary | ICD-10-CM

## 2022-07-24 DIAGNOSIS — R600 Localized edema: Secondary | ICD-10-CM

## 2022-07-24 MED ORDER — LIRAGLUTIDE -WEIGHT MANAGEMENT 18 MG/3ML ~~LOC~~ SOPN: 3.0000 mg | PEN_INJECTOR | Freq: Every day | SUBCUTANEOUS | 1 refills | Status: DC

## 2022-07-24 MED ORDER — SEMAGLUTIDE-WEIGHT MANAGEMENT 1 MG/0.5ML ~~LOC~~ SOAJ: 1.0000 mg | SUBCUTANEOUS | 2 refills | Status: DC

## 2022-07-24 NOTE — Progress Notes (Signed)
[facility] [place], [place] [address]  Internal MEDICINE  Office Visit Note  Patient Name: Stephanie Gates  [id number]  [id number]  Date of Service: 07/24/2022  Chief Complaint  Patient presents with   Follow-up   Hypertension   Leg Swelling    Leg swelling in both legs   GI Problem    Stomach aches for one month - Possibly due to switching to Ozempic    Wheezing   Quality Metric Gaps    Tetanus and Shingles vaccines    HPI Stephanie Gates presents for a follow up visit for weight loss management, BLE edema, stomach ache, taking ozempic. Switched from [employer] to [employer], has lost 3 lbs  Having some lower extremity edema.  Stomach aches but might be due to to ozempic ---wants to try a different med to see if it will have less side effects    Current Medication: Outpatient Encounter Medications as of 07/24/2022  Medication Sig   amLODipine (NORVASC) 5 MG tablet TAKE 1 TABLET(5 MG) BY MOUTH DAILY   Cyanocobalamin (VITAMIN B-12 PO) Take by mouth.   cyclobenzaprine (FLEXERIL) 10 MG tablet Take 1 tablet (10 mg total) by mouth at bedtime. Take one tab po qhs for back spasm prn only   hydrochlorothiazide (HYDRODIURIL) 12.5 MG tablet Take 1 tablet (12.5 mg total) by mouth daily.   Multiple Vitamin (MULTIVITAMIN) capsule Take 1 capsule by mouth daily.   pantoprazole (PROTONIX) 40 MG tablet TAKE 1 TABLET(40 MG) BY MOUTH DAILY   [DISCONTINUED] Liraglutide -Weight Management 18 MG/3ML SOPN Inject 3 mg into the skin daily. After completing titration to maintenance dose ('3mg'$ )   [DISCONTINUED] Semaglutide-Weight Management 1 MG/0.5ML SOAJ Inject 1 mg into the skin once a week.   Liraglutide -Weight Management 18 MG/3ML SOPN Inject 3 mg into the skin daily. After completing titration to maintenance dose ('3mg'$ )   [DISCONTINUED] Semaglutide-Weight Management 1 MG/0.5ML SOAJ Inject 1 mg into the skin once a week.   No facility-administered encounter medications on file as  of 07/24/2022.    Surgical History: Past Surgical History:  Procedure Laterality Date   COLONOSCOPY N/A 05/03/2015   Procedure: COLONOSCOPY;  Surgeon: Lucilla Lame, MD;  Location: [place];  Service: Gastroenterology;  Laterality: N/A;   COLONOSCOPY     COLONOSCOPY WITH PROPOFOL N/A 02/22/2021   Procedure: COLONOSCOPY WITH PROPOFOL;  Surgeon: Lucilla Lame, MD;  Location: [facility] ENDOSCOPY;  Service: Endoscopy;  Laterality: N/A;   ESOPHAGOGASTRODUODENOSCOPY N/A 05/03/2015   Procedure: ESOPHAGOGASTRODUODENOSCOPY (EGD);  Surgeon: Lucilla Lame, MD;  Location: [place];  Service: Gastroenterology;  Laterality: N/A;   FOOT SURGERY  2014   Gastric bypass surgery     POLYPECTOMY  05/03/2015   Procedure: POLYPECTOMY INTESTINAL;  Surgeon: Lucilla Lame, MD;  Location: [place];  Service: Gastroenterology;;    Medical History: Past Medical History:  Diagnosis Date   Allergy    seasonal   Anemia    Arthritis    right hip   Headache    sinus/allergies   Hypertension    Seasonal allergies    Vertigo    1-2x/month   Wears contact lenses     Family History: Family History  Problem Relation Age of Onset   Diabetes Mother    Hypertension Mother    Cancer Father    Diabetes Father    Hypertension Father     Social History   Socioeconomic History   Marital status: Single    Spouse name: Not on  file   Number of children: Not on file   Years of education: Not on file   Highest education level: Not on file  Occupational History   Not on file  Tobacco Use   Smoking status: Never   Smokeless tobacco: Never  Vaping Use   Vaping Use: Never used  Substance and Sexual Activity   Alcohol use: No   Drug use: No   Sexual activity: Not Currently  Other Topics Concern   Not on file  Social History Narrative   Not on file   Social Determinants of Health   Financial Resource Strain: Not on file  Food Insecurity: Not on file  Transportation Needs: Not on file   Physical Activity: Not on file  Stress: Not on file  Social Connections: Not on file  Intimate Partner Violence: Not on file      Review of Systems  Constitutional:  Negative for chills, fatigue and unexpected weight change.  HENT:  Negative for congestion, rhinorrhea, sneezing and sore throat.   Eyes:  Negative for redness.  Respiratory: Negative.  Negative for cough, chest tightness, shortness of breath and wheezing.   Cardiovascular: Negative.  Negative for chest pain and palpitations.  Gastrointestinal:  Negative for abdominal pain, constipation, diarrhea, nausea and vomiting.  Genitourinary:  Negative for dysuria and frequency.  Musculoskeletal:  Negative for arthralgias, back pain, joint swelling and neck pain.  Skin:  Negative for rash.  Neurological: Negative.  Negative for tremors and numbness.  Hematological:  Negative for adenopathy. Does not bruise/bleed easily.  Psychiatric/Behavioral:  Negative for behavioral problems (Depression), sleep disturbance and suicidal ideas. The patient is not nervous/anxious.     Vital Signs: BP 120/70   Pulse 92   Temp 98.6 F (37 C)   Resp 16   Ht '5\' 5"'$  (1.651 m)   Wt 298 lb 12.8 oz (135.5 kg)   LMP 02/11/2017   SpO2 97%   BMI 49.72 kg/m    Physical Exam Vitals reviewed.  Constitutional:      General: She is not in acute distress.    Appearance: Normal appearance. She is obese. She is not ill-appearing.  HENT:     Head: Normocephalic and atraumatic.  Eyes:     Pupils: Pupils are equal, round, and reactive to light.  Cardiovascular:     Rate and Rhythm: Normal rate and regular rhythm.  Pulmonary:     Effort: Pulmonary effort is normal. No respiratory distress.  Musculoskeletal:     Comments: Leg cramps  Neurological:     Mental Status: She is alert and oriented to person, place, and time.  Psychiatric:        Mood and Affect: Mood normal.        Behavior: Behavior normal.        Assessment/Plan: 1. Essential  hypertension Stable, continue current medication as prescribed.   2. Bilateral lower extremity edema Discussed nonpharmacologic interventions to decreased edema, including decreasing salt intake.   3. Morbid obesity with BMI of 50.0-59.9, adult ([place]) Ozempic discontinued. Saxenda samples provided, prescription sent to pharmacy.    General Counseling: mima cranmore understanding of the findings of todays visit and agrees with plan of treatment. I have discussed any further diagnostic evaluation that may be needed or ordered today. We also reviewed her medications today. she has been encouraged to call the office with any questions or concerns that should arise related to todays visit.    No orders of the defined types were placed in  this encounter.   Meds ordered this encounter  Medications   DISCONTD: Semaglutide-Weight Management 1 MG/0.5ML SOAJ    Sig: Inject 1 mg into the skin once a week.    Dispense:  2 mL    Refill:  2    Discontinue 0.5 mg dose, please fill new prescription asap.   DISCONTD: Liraglutide -Weight Management 18 MG/3ML SOPN    Sig: Inject 3 mg into the skin daily. After completing titration to maintenance dose ('3mg'$ )    Dispense:  45 mL    Refill:  1    Patient already has instructions on titration for maintenance dose. Dx code E66.01 90 day supply pls   Liraglutide -Weight Management 18 MG/3ML SOPN    Sig: Inject 3 mg into the skin daily. After completing titration to maintenance dose ('3mg'$ )    Dispense:  45 mL    Refill:  1    Patient already has instructions on titration for maintenance dose. Dx code E66.01 90 day supply pls    Return in about 1 month (around 08/24/2022) for F/U, Weight loss, Kerri Asche PCP.   Total time spent:30 Minutes Time spent includes review of chart, medications, test results, and follow up plan with the patient.   [place] Controlled Substance Database was reviewed by me.  This patient was seen by Jonetta Osgood, FNP-C in  collaboration with Dr. Clayborn Bigness as a part of collaborative care agreement.   Adreyan Carbajal R. Valetta Fuller, MSN, FNP-C Internal medicine

## 2022-08-08 ENCOUNTER — Telehealth: Payer: Self-pay

## 2022-08-08 NOTE — Telephone Encounter (Signed)
Patient called to say that she is experiencing SOB, chest pain and cough. Patient was concerned new sample of Saxenda could be causing this. Per Alyssa, patient was advised to stop taking Saxenda and to take a COVID test. Patient's COVID test was negative, and was advised to go to the ED if symptoms do not stop after stopping Saxenda.

## 2022-08-21 ENCOUNTER — Encounter: Payer: Self-pay | Admitting: Nurse Practitioner

## 2022-08-21 ENCOUNTER — Ambulatory Visit: Payer: 59 | Admitting: Nurse Practitioner

## 2022-08-21 VITALS — BP 142/82 | HR 99 | Temp 97.1°F | Resp 16 | Ht 65.0 in | Wt 299.2 lb

## 2022-08-21 DIAGNOSIS — R600 Localized edema: Secondary | ICD-10-CM

## 2022-08-21 DIAGNOSIS — Z6841 Body Mass Index (BMI) 40.0 and over, adult: Secondary | ICD-10-CM

## 2022-08-21 DIAGNOSIS — R051 Acute cough: Secondary | ICD-10-CM

## 2022-08-21 DIAGNOSIS — I10 Essential (primary) hypertension: Secondary | ICD-10-CM

## 2022-08-21 MED ORDER — HYDROCHLOROTHIAZIDE 25 MG PO TABS: 25.0000 mg | ORAL_TABLET | Freq: Every day | ORAL | 3 refills | Status: DC

## 2022-08-21 MED ORDER — HYDROCOD POLI-CHLORPHE POLI ER 10-8 MG/5ML PO SUER: 5.0000 mL | Freq: Two times a day (BID) | ORAL | 0 refills | Status: DC | PRN

## 2022-08-21 NOTE — Progress Notes (Signed)
[facility] [place], [place] [address]  Internal MEDICINE  Office Visit Note  Patient Name: Stephanie Gates  [id number]  [id number]  Date of Service: 08/21/2022  Chief Complaint  Patient presents with  . Follow-up    Follow up weight loss / feet swelling  . Hypertension    HPI Stephanie Gates presents for a follow up visit for weight loss, hypertension an BLE edema.  Hypertension -- BP slightly elevated but stable, takes amlodipine and HCTZ BLE edema -- takes HCTZ 12.5 mg daily for diuretic  Weight loss management -- has been trying saxenda ample, held while she was sick so has not lost very much at all/ feeling better now but has residual symptoms of cough and scratchy throat. Was treated with antibiotics initially.    Current Medication: Outpatient Encounter Medications as of 08/21/2022  Medication Sig  . amLODipine (NORVASC) 5 MG tablet TAKE 1 TABLET(5 MG) BY MOUTH DAILY  . chlorpheniramine-HYDROcodone (TUSSIONEX) 10-8 MG/5ML Take 5 mLs by mouth every 12 (twelve) hours as needed for cough.  . Cyanocobalamin (VITAMIN B-12 PO) Take by mouth.  . cyclobenzaprine (FLEXERIL) 10 MG tablet Take 1 tablet (10 mg total) by mouth at bedtime. Take one tab po qhs for back spasm prn only  . hydrochlorothiazide (HYDRODIURIL) 25 MG tablet Take 1 tablet (25 mg total) by mouth daily.  . Liraglutide -Weight Management 18 MG/3ML SOPN Inject 3 mg into the skin daily. After completing titration to maintenance dose ('3mg'$ )  . Multiple Vitamin (MULTIVITAMIN) capsule Take 1 capsule by mouth daily.  . pantoprazole (PROTONIX) 40 MG tablet TAKE 1 TABLET(40 MG) BY MOUTH DAILY  . [DISCONTINUED] hydrochlorothiazide (HYDRODIURIL) 12.5 MG tablet Take 1 tablet (12.5 mg total) by mouth daily.   No facility-administered encounter medications on file as of 08/21/2022.    Surgical History: Past Surgical History:  Procedure Laterality Date  . COLONOSCOPY N/A 05/03/2015   Procedure: COLONOSCOPY;   Surgeon: Lucilla Lame, MD;  Location: [place];  Service: Gastroenterology;  Laterality: N/A;  . COLONOSCOPY    . COLONOSCOPY WITH PROPOFOL N/A 02/22/2021   Procedure: COLONOSCOPY WITH PROPOFOL;  Surgeon: Lucilla Lame, MD;  Location: [facility] ENDOSCOPY;  Service: Endoscopy;  Laterality: N/A;  . ESOPHAGOGASTRODUODENOSCOPY N/A 05/03/2015   Procedure: ESOPHAGOGASTRODUODENOSCOPY (EGD);  Surgeon: Lucilla Lame, MD;  Location: [place];  Service: Gastroenterology;  Laterality: N/A;  . FOOT SURGERY  2014  . Gastric bypass surgery    . POLYPECTOMY  05/03/2015   Procedure: POLYPECTOMY INTESTINAL;  Surgeon: Lucilla Lame, MD;  Location: [place];  Service: Gastroenterology;;    Medical History: Past Medical History:  Diagnosis Date  . Allergy    seasonal  . Anemia   . Arthritis    right hip  . Headache    sinus/allergies  . Hypertension   . Seasonal allergies   . Vertigo    1-2x/month  . Wears contact lenses     Family History: Family History  Problem Relation Age of Onset  . Diabetes Mother   . Hypertension Mother   . Cancer Father   . Diabetes Father   . Hypertension Father     Social History   Socioeconomic History  . Marital status: Single    Spouse name: Not on file  . Number of children: Not on file  . Years of education: Not on file  . Highest education level: Not on file  Occupational History  . Not on file  Tobacco Use  . Smoking  status: Never  . Smokeless tobacco: Never  Vaping Use  . Vaping Use: Never used  Substance and Sexual Activity  . Alcohol use: No  . Drug use: No  . Sexual activity: Not Currently  Other Topics Concern  . Not on file  Social History Narrative  . Not on file   Social Determinants of Health   Financial Resource Strain: Not on file  Food Insecurity: Not on file  Transportation Needs: Not on file  Physical Activity: Not on file  Stress: Not on file  Social Connections: Not on file  Intimate Partner  Violence: Not on file      Review of Systems  Constitutional:  Negative for chills, fatigue and unexpected weight change.  HENT:  Negative for congestion, rhinorrhea, sneezing and sore throat.   Eyes:  Negative for redness.  Respiratory: Negative.  Negative for cough, chest tightness, shortness of breath and wheezing.   Cardiovascular: Negative.  Negative for chest pain and palpitations.  Gastrointestinal:  Negative for abdominal pain, constipation, diarrhea, nausea and vomiting.  Genitourinary:  Negative for dysuria and frequency.  Musculoskeletal:  Negative for arthralgias, back pain, joint swelling and neck pain.  Skin:  Negative for rash.  Neurological: Negative.  Negative for tremors and numbness.  Hematological:  Negative for adenopathy. Does not bruise/bleed easily.  Psychiatric/Behavioral:  Negative for behavioral problems (Depression), sleep disturbance and suicidal ideas. The patient is not nervous/anxious.     Vital Signs: BP (!) 142/82   Pulse 99   Temp (!) 97.1 F (36.2 C)   Resp 16   Ht '5\' 5"'$  (1.651 m)   Wt 299 lb 3.2 oz (135.7 kg)   LMP 02/11/2017   SpO2 96%   BMI 49.79 kg/m    Physical Exam     Assessment/Plan:   General Counseling: Stephanie Gates verbalizes understanding of the findings of todays visit and agrees with plan of treatment. I have discussed any further diagnostic evaluation that may be needed or ordered today. We also reviewed her medications today. she has been encouraged to call the office with any questions or concerns that should arise related to todays visit.    No orders of the defined types were placed in this encounter.   Meds ordered this encounter  Medications  . hydrochlorothiazide (HYDRODIURIL) 25 MG tablet    Sig: Take 1 tablet (25 mg total) by mouth daily.    Dispense:  90 tablet    Refill:  3  . chlorpheniramine-HYDROcodone (TUSSIONEX) 10-8 MG/5ML    Sig: Take 5 mLs by mouth every 12 (twelve) hours as needed for cough.     Dispense:  140 mL    Refill:  0    Return in about 1 month (around 09/20/2022) for F/U, Weight loss, BP check, Akiel Fennell PCP.   Total time spent:*** Minutes Time spent includes review of chart, medications, test results, and follow up plan with the patient.   [place] Controlled Substance Database was reviewed by me.  This patient was seen by Jonetta Osgood, FNP-C in collaboration with Dr. Clayborn Bigness as a part of collaborative care agreement.   Christopher Hink R. Valetta Fuller, MSN, FNP-C Internal medicine

## 2022-09-18 ENCOUNTER — Ambulatory Visit: Payer: 59 | Admitting: Nurse Practitioner

## 2022-09-18 ENCOUNTER — Encounter: Payer: Self-pay | Admitting: Nurse Practitioner

## 2022-09-18 VITALS — BP 137/88 | HR 98 | Temp 98.2°F | Resp 16 | Ht 65.0 in | Wt 294.2 lb

## 2022-09-18 DIAGNOSIS — Z23 Encounter for immunization: Secondary | ICD-10-CM | POA: Diagnosis not present

## 2022-09-18 DIAGNOSIS — R600 Localized edema: Secondary | ICD-10-CM

## 2022-09-18 DIAGNOSIS — Z6841 Body Mass Index (BMI) 40.0 and over, adult: Secondary | ICD-10-CM

## 2022-09-18 DIAGNOSIS — R051 Acute cough: Secondary | ICD-10-CM | POA: Diagnosis not present

## 2022-09-18 DIAGNOSIS — I10 Essential (primary) hypertension: Secondary | ICD-10-CM

## 2022-09-18 DIAGNOSIS — S76012A Strain of muscle, fascia and tendon of left hip, initial encounter: Secondary | ICD-10-CM

## 2022-09-18 MED ORDER — HYDROCOD POLI-CHLORPHE POLI ER 10-8 MG/5ML PO SUER: 5.0000 mL | Freq: Two times a day (BID) | ORAL | 0 refills | Status: DC | PRN

## 2022-09-18 MED ORDER — ZOSTER VAC RECOMB ADJUVANTED 50 MCG/0.5ML IM SUSR: 0.5000 mL | Freq: Once | INTRAMUSCULAR | 0 refills | Status: AC

## 2022-09-18 MED ORDER — TETANUS-DIPHTH-ACELL PERTUSSIS 5-2.5-18.5 LF-MCG/0.5 IM SUSP: 0.5000 mL | Freq: Once | INTRAMUSCULAR | 0 refills | Status: AC

## 2022-09-18 MED ORDER — CYCLOBENZAPRINE HCL 10 MG PO TABS: 10.0000 mg | ORAL_TABLET | Freq: Every day | ORAL | 0 refills | Status: DC

## 2022-09-18 NOTE — Progress Notes (Signed)
[facility] [place], [place] [address]  Internal MEDICINE  Office Visit Note  Patient Name: Stephanie Gates  [id number]  [id number]  Date of Service: 09/18/2022  Chief Complaint  Patient presents with   Medical Management of Chronic Issues    Weight management    Hypertension    HPI Stephanie Gates presents for a follow up visit for hypertension, weight loss management, and lingering cough --Still feels like wheezing in upper chest and increased coughing at night  --Lost 5 lbs since last visit --BP is stable today, leg swelling is better today     Current Medication: Outpatient Encounter Medications as of 09/18/2022  Medication Sig   amLODipine (NORVASC) 5 MG tablet TAKE 1 TABLET(5 MG) BY MOUTH DAILY   Cyanocobalamin (VITAMIN B-12 PO) Take by mouth.   hydrochlorothiazide (HYDRODIURIL) 25 MG tablet Take 1 tablet (25 mg total) by mouth daily.   Liraglutide -Weight Management 18 MG/3ML SOPN Inject 3 mg into the skin daily. After completing titration to maintenance dose ('3mg'$ )   Multiple Vitamin (MULTIVITAMIN) capsule Take 1 capsule by mouth daily.   pantoprazole (PROTONIX) 40 MG tablet TAKE 1 TABLET(40 MG) BY MOUTH DAILY   [DISCONTINUED] chlorpheniramine-HYDROcodone (TUSSIONEX) 10-8 MG/5ML Take 5 mLs by mouth every 12 (twelve) hours as needed for cough.   [DISCONTINUED] cyclobenzaprine (FLEXERIL) 10 MG tablet Take 1 tablet (10 mg total) by mouth at bedtime. Take one tab po qhs for back spasm prn only   [DISCONTINUED] Tdap (BOOSTRIX) 5-2.5-18.5 LF-MCG/0.5 injection Inject 0.5 mLs into the muscle once.   [DISCONTINUED] Zoster Vaccine Adjuvanted [facility]) injection Inject 0.5 mLs into the muscle once.   cyclobenzaprine (FLEXERIL) 10 MG tablet Take 1 tablet (10 mg total) by mouth at bedtime. Take one tab po qhs for back spasm prn only   [EXPIRED] Tdap (BOOSTRIX) 5-2.5-18.5 LF-MCG/0.5 injection Inject 0.5 mLs into the muscle once for 1 dose.   [EXPIRED] Zoster  Vaccine Adjuvanted [facility]) injection Inject 0.5 mLs into the muscle once for 1 dose.   [DISCONTINUED] chlorpheniramine-HYDROcodone (TUSSIONEX) 10-8 MG/5ML Take 5 mLs by mouth every 12 (twelve) hours as needed for cough.   No facility-administered encounter medications on file as of 09/18/2022.    Surgical History: Past Surgical History:  Procedure Laterality Date   COLONOSCOPY N/A 05/03/2015   Procedure: COLONOSCOPY;  Surgeon: Lucilla Lame, MD;  Location: [employer];  Service: Gastroenterology;  Laterality: N/A;   COLONOSCOPY     COLONOSCOPY WITH PROPOFOL N/A 02/22/2021   Procedure: COLONOSCOPY WITH PROPOFOL;  Surgeon: Lucilla Lame, MD;  Location[facility] ENDOSCOPY;  Service: Endoscopy;  Laterality: N/A;   ESOPHAGOGASTRODUODENOSCOPY N/A 05/03/2015   Procedure: ESOPHAGOGASTRODUODENOSCOPY (EGD);  Surgeon: Lucilla Lame, MD;  Location: [place];  Service: Gastroenterology;  Laterality: N/A;   FOOT SURGERY  2014   Gastric bypass surgery     POLYPECTOMY  05/03/2015   Procedure: POLYPECTOMY INTESTINAL;  Surgeon: Lucilla Lame, MD;  Location: [place];  Service: Gastroenterology;;    Medical History: Past Medical History:  Diagnosis Date   Allergy    seasonal   Anemia    Arthritis    right hip   Headache    sinus/allergies   Hypertension    Seasonal allergies    Vertigo    1-2x/month   Wears contact lenses     Family History: Family History  Problem Relation Age of Onset   Diabetes Mother    Hypertension Mother    Cancer Father    Diabetes Father  Hypertension Father     Social History   Socioeconomic History   Marital status: Single    Spouse name: Not on file   Number of children: Not on file   Years of education: Not on file   Highest education level: Not on file  Occupational History   Not on file  Tobacco Use   Smoking status: Never   Smokeless tobacco: Never  Vaping Use   Vaping Use: Never used  Substance and Sexual Activity    Alcohol use: No   Drug use: No   Sexual activity: Not Currently  Other Topics Concern   Not on file  Social History Narrative   Not on file   Social Determinants of Health   Financial Resource Strain: Not on file  Food Insecurity: Not on file  Transportation Needs: Not on file  Physical Activity: Not on file  Stress: Not on file  Social Connections: Not on file  Intimate Partner Violence: Not on file      Review of Systems  Constitutional:  Negative for chills, fatigue and unexpected weight change.  HENT:  Negative for congestion, rhinorrhea, sneezing and sore throat.   Eyes:  Negative for redness.  Respiratory:  Positive for cough and wheezing. Negative for chest tightness and shortness of breath.   Cardiovascular:  Negative for chest pain, palpitations and leg swelling.  Gastrointestinal:  Negative for abdominal pain, constipation, diarrhea, nausea and vomiting.  Genitourinary:  Negative for dysuria and frequency.  Musculoskeletal:  Negative for arthralgias, back pain, joint swelling and neck pain.  Skin:  Negative for rash.  Neurological: Negative.  Negative for tremors and numbness.  Hematological:  Negative for adenopathy. Does not bruise/bleed easily.  Psychiatric/Behavioral:  Positive for sleep disturbance. Negative for behavioral problems (Depression) and suicidal ideas. The patient is not nervous/anxious.     Vital Signs: BP 137/88   Pulse 98   Temp 98.2 F (36.8 C)   Resp 16   Ht '5\' 5"'$  (1.651 m)   Wt 294 lb 3.2 oz (133.4 kg)   LMP 02/11/2017   SpO2 99%   BMI 48.96 kg/m    Physical Exam Vitals reviewed.  Constitutional:      General: She is not in acute distress.    Appearance: Normal appearance. She is obese. She is not ill-appearing.  HENT:     Head: Normocephalic and atraumatic.  Eyes:     Pupils: Pupils are equal, round, and reactive to light.  Cardiovascular:     Rate and Rhythm: Normal rate and regular rhythm.     Heart sounds: Normal heart  sounds. No murmur heard. Pulmonary:     Effort: Pulmonary effort is normal. No respiratory distress.     Breath sounds: Wheezing present.  Neurological:     Mental Status: She is alert and oriented to person, place, and time.  Psychiatric:        Mood and Affect: Mood normal.        Behavior: Behavior normal.        Assessment/Plan: 1. Acute cough Tussionex refill ordered  2. Muscle strain of gluteal region, left, initial encounter Muscle strain, may take prn cyclobenzaprine at night to decrease musculoskeletal pain - cyclobenzaprine (FLEXERIL) 10 MG tablet; Take 1 tablet (10 mg total) by mouth at bedtime. Take one tab po qhs for back spasm prn only  Dispense: 30 tablet; Refill: 0  3. Morbid obesity with BMI of 50.0-59.9, adult ([facility]) Lost 5 lbs, no changes, continue with modifications as  discussed.   4. Flu vaccine need Administered in office today - Flu Vaccine MDCK QUAD PF  5. Need for vaccination - Zoster Vaccine Adjuvanted [facility]) injection; Inject 0.5 mLs into the muscle once for 1 dose.  Dispense: 0.5 mL; Refill: 0 - Tdap (BOOSTRIX) 5-2.5-18.5 LF-MCG/0.5 injection; Inject 0.5 mLs into the muscle once for 1 dose.  Dispense: 0.5 mL; Refill: 0    General Counseling: Stephanie Gates verbalizes understanding of the findings of todays visit and agrees with plan of treatment. I have discussed any further diagnostic evaluation that may be needed or ordered today. We also reviewed her medications today. she has been encouraged to call the office with any questions or concerns that should arise related to todays visit.    Orders Placed This Encounter  Procedures   Flu Vaccine MDCK QUAD PF    Meds ordered this encounter  Medications   Zoster Vaccine Adjuvanted [facility]) injection    Sig: Inject 0.5 mLs into the muscle once for 1 dose.    Dispense:  0.5 mL    Refill:  0   Tdap (BOOSTRIX) 5-2.5-18.5 LF-MCG/0.5 injection    Sig: Inject 0.5 mLs into the muscle once for 1 dose.     Dispense:  0.5 mL    Refill:  0   cyclobenzaprine (FLEXERIL) 10 MG tablet    Sig: Take 1 tablet (10 mg total) by mouth at bedtime. Take one tab po qhs for back spasm prn only    Dispense:  30 tablet    Refill:  0   DISCONTD: chlorpheniramine-HYDROcodone (TUSSIONEX) 10-8 MG/5ML    Sig: Take 5 mLs by mouth every 12 (twelve) hours as needed for cough.    Dispense:  140 mL    Refill:  0    Return in about 2 months (around 11/18/2022) for F/U, Weight loss, Stephanie Gates PCP.   Total time spent:30 Minutes Time spent includes review of chart, medications, test results, and follow up plan with the patient.   [place] Controlled Substance Database was reviewed by me.  This patient was seen by Jonetta Osgood, FNP-C in collaboration with Dr. Clayborn Bigness as a part of collaborative care agreement.   Mallarie Voorhies R. Valetta Fuller, MSN, FNP-C Internal medicine

## 2022-09-22 ENCOUNTER — Encounter: Payer: Self-pay | Admitting: Nurse Practitioner

## 2022-10-10 ENCOUNTER — Ambulatory Visit
Admission: RE | Admit: 2022-10-10 | Discharge: 2022-10-10 | Disposition: A | Discharge status: Home / Self Care | Payer: 59 | Source: Ambulatory Visit | Attending: Nurse Practitioner | Admitting: Nurse Practitioner

## 2022-10-10 ENCOUNTER — Encounter: Payer: Self-pay | Admitting: Nurse Practitioner

## 2022-10-10 ENCOUNTER — Ambulatory Visit: Payer: 59 | Admitting: Nurse Practitioner

## 2022-10-10 ENCOUNTER — Ambulatory Visit
Admission: RE | Admit: 2022-10-10 | Discharge: 2022-10-10 | Disposition: A | Discharge status: Home / Self Care | Payer: 59 | Attending: Nurse Practitioner | Admitting: Nurse Practitioner

## 2022-10-10 VITALS — BP 145/88 | HR 99 | Temp 97.6°F | Resp 16 | Ht 65.0 in | Wt 290.8 lb

## 2022-10-10 DIAGNOSIS — J189 Pneumonia, unspecified organism: Secondary | ICD-10-CM

## 2022-10-10 MED ORDER — CEFUROXIME AXETIL 500 MG PO TABS: 500.0000 mg | ORAL_TABLET | Freq: Two times a day (BID) | ORAL | 0 refills | Status: AC

## 2022-10-10 MED ORDER — LEVOFLOXACIN 750 MG PO TABS: 750.0000 mg | ORAL_TABLET | Freq: Every day | ORAL | 0 refills | Status: AC

## 2022-10-10 NOTE — Progress Notes (Signed)
[facility] [place], [place] [address]  Internal MEDICINE  Office Visit Note  Patient Name: Stephanie Gates  [id number]  [id number]  Date of Service: 10/10/2022  Chief Complaint  Patient presents with   Acute Visit    Coughing up green stuff    Cough   Wheezing     HPI Stephanie Gates presents for an acute sick visit for continued respiratory infection. Prior bronchitis a couple of months ago, deep cough, chest tightness. Wheezing, SOB, green sinus drainage, sinus pressure Symptoms started worsening last week and the bronchitis had never fully resolved.     Current Medication:  Outpatient Encounter Medications as of 10/10/2022  Medication Sig   amLODipine (NORVASC) 5 MG tablet TAKE 1 TABLET(5 MG) BY MOUTH DAILY   cefUROXime (CEFTIN) 500 MG tablet Take 1 tablet (500 mg total) by mouth 2 (two) times daily with a meal for 5 days.   chlorpheniramine-HYDROcodone (TUSSIONEX) 10-8 MG/5ML Take 5 mLs by mouth every 12 (twelve) hours as needed for cough.   Cyanocobalamin (VITAMIN B-12 PO) Take by mouth.   cyclobenzaprine (FLEXERIL) 10 MG tablet Take 1 tablet (10 mg total) by mouth at bedtime. Take one tab po qhs for back spasm prn only   hydrochlorothiazide (HYDRODIURIL) 25 MG tablet Take 1 tablet (25 mg total) by mouth daily.   levofloxacin (LEVAQUIN) 750 MG tablet Take 1 tablet (750 mg total) by mouth daily for 5 days.   Liraglutide -Weight Management 18 MG/3ML SOPN Inject 3 mg into the skin daily. After completing titration to maintenance dose ('3mg'$ )   Multiple Vitamin (MULTIVITAMIN) capsule Take 1 capsule by mouth daily.   pantoprazole (PROTONIX) 40 MG tablet TAKE 1 TABLET(40 MG) BY MOUTH DAILY   No facility-administered encounter medications on file as of 10/10/2022.      Medical History: Past Medical History:  Diagnosis Date   Allergy    seasonal   Anemia    Arthritis    right hip   Headache    sinus/allergies   Hypertension    Seasonal allergies     Vertigo    1-2x/month   Wears contact lenses      Vital Signs: BP (!) 145/88   Pulse 99   Temp 97.6 F (36.4 C)   Resp 16   Ht '5\' 5"'$  (1.651 m)   Wt 290 lb 12.8 oz (131.9 kg)   LMP 02/11/2017   SpO2 99%   BMI 48.39 kg/m    Review of Systems  Constitutional:  Positive for appetite change, chills and fever. Negative for fatigue.  HENT:  Positive for congestion, postnasal drip, rhinorrhea, sinus pressure, sinus pain and sore throat. Negative for ear pain, sneezing, trouble swallowing and voice change.   Respiratory:  Positive for cough, chest tightness, shortness of breath and wheezing.   Cardiovascular: Negative.  Negative for chest pain and palpitations.  Gastrointestinal:  Positive for diarrhea (last week) and vomiting (from deep coughing). Negative for nausea.  Neurological:  Negative for headaches.    Physical Exam Constitutional:      Appearance: She is obese. She is ill-appearing.  HENT:     Head: Normocephalic and atraumatic.     Right Ear: Tympanic membrane, ear canal and external ear normal.     Left Ear: Tympanic membrane, ear canal and external ear normal.     Nose: Congestion and rhinorrhea present.     Mouth/Throat:     Mouth: Mucous membranes are moist.     Pharynx: Posterior oropharyngeal  erythema present.  Eyes:     Pupils: Pupils are equal, round, and reactive to light.  Cardiovascular:     Rate and Rhythm: Normal rate and regular rhythm.     Heart sounds: Normal heart sounds. No murmur heard. Pulmonary:     Effort: Accessory muscle usage present. No respiratory distress.     Breath sounds: Decreased air movement present. Examination of the right-upper field reveals wheezing. Examination of the left-upper field reveals wheezing. Examination of the right-middle field reveals wheezing and rales. Examination of the left-middle field reveals wheezing and rales. Examination of the right-lower field reveals rales. Examination of the left-lower field reveals  rales. Wheezing (expiratory) and rales present.  Neurological:     Mental Status: She is alert and oriented to person, place, and time.  Psychiatric:        Mood and Affect: Mood normal.        Behavior: Behavior normal.       Assessment/Plan: 1. Community acquired pneumonia of both lungs Suspect pneumonia, antibiotic treatment prescribed as per CAP guidelines, and chest xray ordered for confirmation. Patient has penicillin allergy. - DG Chest 2 View; Future - cefUROXime (CEFTIN) 500 MG tablet; Take 1 tablet (500 mg total) by mouth 2 (two) times daily with a meal for 5 days.  Dispense: 10 tablet; Refill: 0 - levofloxacin (LEVAQUIN) 750 MG tablet; Take 1 tablet (750 mg total) by mouth daily for 5 days.  Dispense: 5 tablet; Refill: 0   General Counseling: Mame verbalizes understanding of the findings of todays visit and agrees with plan of treatment. I have discussed any further diagnostic evaluation that may be needed or ordered today. We also reviewed her medications today. she has been encouraged to call the office with any questions or concerns that should arise related to todays visit.    Counseling:    Orders Placed This Encounter  Procedures   DG Chest 2 View    Meds ordered this encounter  Medications   cefUROXime (CEFTIN) 500 MG tablet    Sig: Take 1 tablet (500 mg total) by mouth 2 (two) times daily with a meal for 5 days.    Dispense:  10 tablet    Refill:  0   levofloxacin (LEVAQUIN) 750 MG tablet    Sig: Take 1 tablet (750 mg total) by mouth daily for 5 days.    Dispense:  5 tablet    Refill:  0    Return if symptoms worsen or fail to improve, for and as previously scheduled, will call with chest xray results.  [place] Controlled Substance Database was reviewed by me for overdose risk score (ORS)  Time spent:20 Minutes Time spent with patient included reviewing progress notes, labs, imaging studies, and discussing plan for follow up.   This patient was seen  by Stephanie Osgood, FNP-C in collaboration with Dr. Clayborn Gates as a part of collaborative care agreement.  Stephanie Gates R. Stephanie Fuller, MSN, FNP-C Internal Medicine

## 2022-10-10 NOTE — Progress Notes (Signed)
Please let patient know that her chest xray results are suggestive of pneumonia in the base of her lungs bilaterally. No change in her treatment plan. Take both antibiotics as prescribed and use the cough syrup as needed.

## 2022-10-16 ENCOUNTER — Encounter: Payer: Self-pay | Admitting: Nurse Practitioner

## 2022-10-29 ENCOUNTER — Other Ambulatory Visit: Payer: Self-pay | Admitting: Nurse Practitioner

## 2022-10-29 MED ORDER — HYDROCOD POLI-CHLORPHE POLI ER 10-8 MG/5ML PO SUER: 5.0000 mL | Freq: Two times a day (BID) | ORAL | 0 refills | Status: DC | PRN

## 2022-11-02 ENCOUNTER — Encounter: Payer: Self-pay | Admitting: Nurse Practitioner

## 2022-11-07 ENCOUNTER — Telehealth: Payer: Self-pay

## 2022-11-07 NOTE — Telephone Encounter (Signed)
As per alyssa advised her that she can cut in half amlodipine and see how is doing  and  keep track for BP

## 2022-11-15 ENCOUNTER — Ambulatory Visit: Payer: 59 | Admitting: Nurse Practitioner

## 2022-11-15 ENCOUNTER — Encounter: Payer: Self-pay | Admitting: Nurse Practitioner

## 2022-11-15 ENCOUNTER — Other Ambulatory Visit: Payer: Self-pay | Admitting: Nurse Practitioner

## 2022-11-15 VITALS — BP 137/86 | HR 95 | Temp 97.4°F | Resp 16 | Ht 65.0 in | Wt 306.6 lb

## 2022-11-15 DIAGNOSIS — R600 Localized edema: Secondary | ICD-10-CM

## 2022-11-15 DIAGNOSIS — R051 Acute cough: Secondary | ICD-10-CM

## 2022-11-15 DIAGNOSIS — Z8701 Personal history of pneumonia (recurrent): Secondary | ICD-10-CM | POA: Diagnosis not present

## 2022-11-15 DIAGNOSIS — J189 Pneumonia, unspecified organism: Secondary | ICD-10-CM

## 2022-11-15 MED ORDER — BISOPROLOL FUMARATE 5 MG PO TABS: 5.0000 mg | ORAL_TABLET | Freq: Every day | ORAL | 1 refills | Status: DC

## 2022-11-15 MED ORDER — HYDROCOD POLI-CHLORPHE POLI ER 10-8 MG/5ML PO SUER: 5.0000 mL | Freq: Every evening | ORAL | 0 refills | Status: DC | PRN

## 2022-11-15 MED ORDER — BENZONATATE 200 MG PO CAPS: 200.0000 mg | ORAL_CAPSULE | Freq: Two times a day (BID) | ORAL | 2 refills | Status: DC | PRN

## 2022-11-15 NOTE — Progress Notes (Signed)
[facility] [place], [place] [address]  Internal MEDICINE  Office Visit Note  Patient Name: Stephanie Gates  [id number]  [id number]  Date of Service: 11/15/2022  Chief Complaint  Patient presents with  . Acute Visit     HPI Stephanie Gates presents for an acute sick visit for bilateral lower extremity edema, SOB and lingering cough.  Bilateral lower extremity edema -- still significant even after decreasing to 1/2 tablet of amlodipine. SOB and lingering cough s/p recently treated for pneumonia -- cough keeps pt up at night and bothers her at work and can get SOB .        Current Medication:  Outpatient Encounter Medications as of 11/15/2022  Medication Sig  . benzonatate (TESSALON) 200 MG capsule Take 1 capsule (200 mg total) by mouth 2 (two) times daily as needed for cough.  . bisoprolol (ZEBETA) 5 MG tablet Take 1 tablet (5 mg total) by mouth daily.  . Cyanocobalamin (VITAMIN B-12 PO) Take by mouth.  . cyclobenzaprine (FLEXERIL) 10 MG tablet Take 1 tablet (10 mg total) by mouth at bedtime. Take one tab po qhs for back spasm prn only  . hydrochlorothiazide (HYDRODIURIL) 25 MG tablet Take 1 tablet (25 mg total) by mouth daily.  . Liraglutide -Weight Management 18 MG/3ML SOPN Inject 3 mg into the skin daily. After completing titration to maintenance dose ('3mg'$ )  . Multiple Vitamin (MULTIVITAMIN) capsule Take 1 capsule by mouth daily.  . pantoprazole (PROTONIX) 40 MG tablet TAKE 1 TABLET(40 MG) BY MOUTH DAILY  . [DISCONTINUED] amLODipine (NORVASC) 5 MG tablet TAKE 1 TABLET(5 MG) BY MOUTH DAILY  . [DISCONTINUED] chlorpheniramine-HYDROcodone (TUSSIONEX) 10-8 MG/5ML Take 5 mLs by mouth every 12 (twelve) hours as needed for cough.  . chlorpheniramine-HYDROcodone (TUSSIONEX) 10-8 MG/5ML Take 5 mLs by mouth at bedtime as needed for cough.   No facility-administered encounter medications on file as of 11/15/2022.      Medical History: Past Medical History:   Diagnosis Date  . Allergy    seasonal  . Anemia   . Arthritis    right hip  . Headache    sinus/allergies  . Hypertension   . Seasonal allergies   . Vertigo    1-2x/month  . Wears contact lenses      Vital Signs: BP 137/86   Pulse 95   Temp (!) 97.4 F (36.3 C)   Resp 16   Ht '5\' 5"'$  (1.651 m)   Wt (!) 306 lb 9.6 oz (139.1 kg)   LMP 02/11/2017   SpO2 99%   BMI 51.02 kg/m    Review of Systems  Physical Exam    Assessment/Plan:   General Counseling: Stephanie Gates verbalizes understanding of the findings of todays visit and agrees with plan of treatment. I have discussed any further diagnostic evaluation that may be needed or ordered today. We also reviewed her medications today. she has been encouraged to call the office with any questions or concerns that should arise related to todays visit.    Counseling:    No orders of the defined types were placed in this encounter.   Meds ordered this encounter  Medications  . benzonatate (TESSALON) 200 MG capsule    Sig: Take 1 capsule (200 mg total) by mouth 2 (two) times daily as needed for cough.    Dispense:  60 capsule    Refill:  2    Residual symptoms post pneumonia  . bisoprolol (ZEBETA) 5 MG tablet    Sig: Take  1 tablet (5 mg total) by mouth daily.    Dispense:  30 tablet    Refill:  1    Discontinue amlodipine, fill new script today  . chlorpheniramine-HYDROcodone (TUSSIONEX) 10-8 MG/5ML    Sig: Take 5 mLs by mouth at bedtime as needed for cough.    Dispense:  140 mL    Refill:  0    Return in about 4 weeks (around 12/13/2022) for F/U, BP check, Ahmir Bracken PCP, eval new med.  [place] Controlled Substance Database was reviewed by me for overdose risk score (ORS)  Time spent:*** Minutes Time spent with patient included reviewing progress notes, labs, imaging studies, and discussing plan for follow up.   This patient was seen by Stephanie Osgood, FNP-C in collaboration with Dr. Clayborn Bigness as a part of collaborative  care agreement.  Rebeka Kimble R. Valetta Fuller, MSN, FNP-C Internal Medicine

## 2022-11-28 ENCOUNTER — Telehealth: Payer: Self-pay

## 2022-11-30 ENCOUNTER — Inpatient Hospital Stay (HOSPITAL_BASED_OUTPATIENT_CLINIC_OR_DEPARTMENT_OTHER)
Admission: EM | Admit: 2022-11-30 | Discharge: 2022-12-13 | DRG: 286 | Disposition: A | Discharge status: Home / Self Care | Payer: 59 | Attending: Cardiovascular Disease | Admitting: Cardiovascular Disease

## 2022-11-30 ENCOUNTER — Encounter (HOSPITAL_COMMUNITY): Payer: Self-pay

## 2022-11-30 ENCOUNTER — Emergency Department (HOSPITAL_BASED_OUTPATIENT_CLINIC_OR_DEPARTMENT_OTHER): Payer: 59

## 2022-11-30 ENCOUNTER — Other Ambulatory Visit: Payer: Self-pay

## 2022-11-30 ENCOUNTER — Encounter (HOSPITAL_BASED_OUTPATIENT_CLINIC_OR_DEPARTMENT_OTHER): Payer: Self-pay

## 2022-11-30 DIAGNOSIS — R0602 Shortness of breath: Secondary | ICD-10-CM

## 2022-11-30 DIAGNOSIS — Z9884 Bariatric surgery status: Secondary | ICD-10-CM

## 2022-11-30 DIAGNOSIS — Y828 Other medical devices associated with adverse incidents: Secondary | ICD-10-CM | POA: Diagnosis not present

## 2022-11-30 DIAGNOSIS — I081 Rheumatic disorders of both mitral and tricuspid valves: Secondary | ICD-10-CM | POA: Diagnosis present

## 2022-11-30 DIAGNOSIS — I5021 Acute systolic (congestive) heart failure: Secondary | ICD-10-CM | POA: Insufficient documentation

## 2022-11-30 DIAGNOSIS — I428 Other cardiomyopathies: Secondary | ICD-10-CM | POA: Diagnosis not present

## 2022-11-30 DIAGNOSIS — R109 Unspecified abdominal pain: Secondary | ICD-10-CM | POA: Insufficient documentation

## 2022-11-30 DIAGNOSIS — I472 Ventricular tachycardia, unspecified: Secondary | ICD-10-CM | POA: Diagnosis present

## 2022-11-30 DIAGNOSIS — Z7984 Long term (current) use of oral hypoglycemic drugs: Secondary | ICD-10-CM

## 2022-11-30 DIAGNOSIS — R140 Abdominal distension (gaseous): Secondary | ICD-10-CM | POA: Diagnosis present

## 2022-11-30 DIAGNOSIS — I959 Hypotension, unspecified: Secondary | ICD-10-CM | POA: Diagnosis not present

## 2022-11-30 DIAGNOSIS — Z8249 Family history of ischemic heart disease and other diseases of the circulatory system: Secondary | ICD-10-CM

## 2022-11-30 DIAGNOSIS — I5023 Acute on chronic systolic (congestive) heart failure: Secondary | ICD-10-CM | POA: Diagnosis present

## 2022-11-30 DIAGNOSIS — R809 Proteinuria, unspecified: Secondary | ICD-10-CM | POA: Diagnosis present

## 2022-11-30 DIAGNOSIS — R609 Edema, unspecified: Secondary | ICD-10-CM

## 2022-11-30 DIAGNOSIS — I110 Hypertensive heart disease with heart failure: Principal | ICD-10-CM | POA: Diagnosis present

## 2022-11-30 DIAGNOSIS — Z880 Allergy status to penicillin: Secondary | ICD-10-CM

## 2022-11-30 DIAGNOSIS — E8770 Fluid overload, unspecified: Secondary | ICD-10-CM | POA: Diagnosis present

## 2022-11-30 DIAGNOSIS — I10 Essential (primary) hypertension: Secondary | ICD-10-CM | POA: Diagnosis present

## 2022-11-30 DIAGNOSIS — R079 Chest pain, unspecified: Secondary | ICD-10-CM

## 2022-11-30 DIAGNOSIS — E1169 Type 2 diabetes mellitus with other specified complication: Secondary | ICD-10-CM | POA: Diagnosis present

## 2022-11-30 DIAGNOSIS — I5082 Biventricular heart failure: Secondary | ICD-10-CM | POA: Diagnosis present

## 2022-11-30 DIAGNOSIS — I071 Rheumatic tricuspid insufficiency: Secondary | ICD-10-CM | POA: Insufficient documentation

## 2022-11-30 DIAGNOSIS — R7303 Prediabetes: Secondary | ICD-10-CM | POA: Diagnosis present

## 2022-11-30 DIAGNOSIS — E785 Hyperlipidemia, unspecified: Secondary | ICD-10-CM | POA: Diagnosis present

## 2022-11-30 DIAGNOSIS — R7401 Elevation of levels of liver transaminase levels: Secondary | ICD-10-CM | POA: Diagnosis not present

## 2022-11-30 DIAGNOSIS — I447 Left bundle-branch block, unspecified: Secondary | ICD-10-CM | POA: Diagnosis present

## 2022-11-30 DIAGNOSIS — D509 Iron deficiency anemia, unspecified: Secondary | ICD-10-CM | POA: Diagnosis present

## 2022-11-30 DIAGNOSIS — Z888 Allergy status to other drugs, medicaments and biological substances status: Secondary | ICD-10-CM

## 2022-11-30 DIAGNOSIS — R7989 Other specified abnormal findings of blood chemistry: Secondary | ICD-10-CM

## 2022-11-30 DIAGNOSIS — I5A Non-ischemic myocardial injury (non-traumatic): Secondary | ICD-10-CM | POA: Insufficient documentation

## 2022-11-30 DIAGNOSIS — I4729 Other ventricular tachycardia: Secondary | ICD-10-CM | POA: Insufficient documentation

## 2022-11-30 DIAGNOSIS — Z833 Family history of diabetes mellitus: Secondary | ICD-10-CM

## 2022-11-30 DIAGNOSIS — N179 Acute kidney failure, unspecified: Secondary | ICD-10-CM

## 2022-11-30 DIAGNOSIS — T82897A Other specified complication of cardiac prosthetic devices, implants and grafts, initial encounter: Secondary | ICD-10-CM | POA: Diagnosis not present

## 2022-11-30 DIAGNOSIS — J90 Pleural effusion, not elsewhere classified: Secondary | ICD-10-CM

## 2022-11-30 DIAGNOSIS — I340 Nonrheumatic mitral (valve) insufficiency: Secondary | ICD-10-CM | POA: Insufficient documentation

## 2022-11-30 DIAGNOSIS — R110 Nausea: Secondary | ICD-10-CM | POA: Diagnosis present

## 2022-11-30 DIAGNOSIS — Z6841 Body Mass Index (BMI) 40.0 and over, adult: Secondary | ICD-10-CM

## 2022-11-30 DIAGNOSIS — Z79899 Other long term (current) drug therapy: Secondary | ICD-10-CM

## 2022-11-30 DIAGNOSIS — N170 Acute kidney failure with tubular necrosis: Secondary | ICD-10-CM | POA: Diagnosis not present

## 2022-11-30 DIAGNOSIS — Z1152 Encounter for screening for COVID-19: Secondary | ICD-10-CM

## 2022-11-30 DIAGNOSIS — I509 Heart failure, unspecified: Secondary | ICD-10-CM

## 2022-11-30 DIAGNOSIS — I371 Nonrheumatic pulmonary valve insufficiency: Secondary | ICD-10-CM | POA: Diagnosis present

## 2022-11-30 DIAGNOSIS — E876 Hypokalemia: Secondary | ICD-10-CM | POA: Diagnosis present

## 2022-11-30 DIAGNOSIS — Z8701 Personal history of pneumonia (recurrent): Secondary | ICD-10-CM

## 2022-11-30 HISTORY — DX: Gastritis, unspecified, without bleeding: K29.70

## 2022-11-30 LAB — CBC
HCT: 33.8 % — ABNORMAL LOW (ref 36.0–46.0)
Hemoglobin: 10.3 g/dL — ABNORMAL LOW (ref 12.0–15.0)
MCH: 21.6 pg — ABNORMAL LOW (ref 26.0–34.0)
MCHC: 30.5 g/dL (ref 30.0–36.0)
MCV: 71 fL — ABNORMAL LOW (ref 80.0–100.0)
Platelets: 360 10*3/uL (ref 150–400)
RBC: 4.76 MIL/uL (ref 3.87–5.11)
RDW: 17.7 % — ABNORMAL HIGH (ref 11.5–15.5)
WBC: 11.1 10*3/uL — ABNORMAL HIGH (ref 4.0–10.5)
nRBC: 0 % (ref 0.0–0.2)

## 2022-11-30 LAB — COMPREHENSIVE METABOLIC PANEL
ALT: 48 U/L — ABNORMAL HIGH (ref 0–44)
AST: 38 U/L (ref 15–41)
Albumin: 3.3 g/dL — ABNORMAL LOW (ref 3.5–5.0)
Alkaline Phosphatase: 87 U/L (ref 38–126)
Anion gap: 9 (ref 5–15)
BUN: 18 mg/dL (ref 6–20)
CO2: 29 mmol/L (ref 22–32)
Calcium: 8.8 mg/dL — ABNORMAL LOW (ref 8.9–10.3)
Chloride: 100 mmol/L (ref 98–111)
Creatinine, Ser: 1.15 mg/dL — ABNORMAL HIGH (ref 0.44–1.00)
GFR, Estimated: 56 mL/min — ABNORMAL LOW (ref >60)
Glucose, Bld: 117 mg/dL — ABNORMAL HIGH (ref 70–99)
Potassium: 2.6 mmol/L — CL (ref 3.5–5.1)
Sodium: 138 mmol/L (ref 135–145)
Total Bilirubin: 1.5 mg/dL — ABNORMAL HIGH (ref 0.3–1.2)
Total Protein: 6.9 g/dL (ref 6.5–8.1)

## 2022-11-30 LAB — PREGNANCY, URINE: Preg Test, Ur: NEGATIVE

## 2022-11-30 LAB — RESP PANEL BY RT-PCR (RSV, FLU A&B, COVID)  RVPGX2
Influenza A by PCR: NEGATIVE
Influenza B by PCR: NEGATIVE
Resp Syncytial Virus by PCR: NEGATIVE
SARS Coronavirus 2 by RT PCR: NEGATIVE

## 2022-11-30 LAB — URINALYSIS, ROUTINE W REFLEX MICROSCOPIC
Bilirubin Urine: NEGATIVE
Glucose, UA: NEGATIVE mg/dL
Hgb urine dipstick: NEGATIVE
Ketones, ur: NEGATIVE mg/dL
Leukocytes,Ua: NEGATIVE
Nitrite: NEGATIVE
Protein, ur: 100 mg/dL — AB
Specific Gravity, Urine: 1.025 (ref 1.005–1.030)
pH: 5.5 (ref 5.0–8.0)

## 2022-11-30 LAB — URINALYSIS, MICROSCOPIC (REFLEX)

## 2022-11-30 LAB — MAGNESIUM: Magnesium: 1.6 mg/dL — ABNORMAL LOW (ref 1.7–2.4)

## 2022-11-30 LAB — LIPASE, BLOOD: Lipase: 43 U/L (ref 11–51)

## 2022-11-30 LAB — BRAIN NATRIURETIC PEPTIDE: B Natriuretic Peptide: 777.6 pg/mL — ABNORMAL HIGH (ref 0.0–100.0)

## 2022-11-30 LAB — TROPONIN I (HIGH SENSITIVITY): Troponin I (High Sensitivity): 895 ng/L (ref <18)

## 2022-11-30 MED ORDER — IOHEXOL 300 MG/ML  SOLN
100.0000 mL | Freq: Once | INTRAMUSCULAR | Status: AC | PRN
  Administered 2022-11-30: 100 mL via INTRAVENOUS

## 2022-11-30 MED ORDER — HEPARIN (PORCINE) 25000 UT/250ML-% IV SOLN
1400.0000 [IU]/h | INTRAVENOUS | Status: DC
  Administered 2022-11-30 – 2022-12-03 (×4): 1400 [IU]/h via INTRAVENOUS
  Filled 2022-11-30 (×4): qty 250

## 2022-11-30 MED ORDER — HEPARIN BOLUS VIA INFUSION
4000.0000 [IU] | Freq: Once | INTRAVENOUS | Status: AC
  Administered 2022-11-30: 4000 [IU] via INTRAVENOUS

## 2022-11-30 MED ORDER — FUROSEMIDE 10 MG/ML IJ SOLN
40.0000 mg | Freq: Once | INTRAMUSCULAR | Status: AC
  Administered 2022-11-30: 40 mg via INTRAVENOUS
  Filled 2022-11-30: qty 4

## 2022-11-30 MED ORDER — POTASSIUM CHLORIDE 10 MEQ/100ML IV SOLN
10.0000 meq | INTRAVENOUS | Status: AC
  Administered 2022-11-30 – 2022-12-01 (×3): 10 meq via INTRAVENOUS
  Filled 2022-11-30 (×2): qty 100

## 2022-11-30 MED ORDER — ALBUTEROL SULFATE HFA 108 (90 BASE) MCG/ACT IN AERS: 2.0000 | INHALATION_SPRAY | RESPIRATORY_TRACT | Status: DC | PRN

## 2022-11-30 NOTE — Plan of Care (Signed)
Spoke to Dr. Sherry Ruffing.  56 year old female with history of hypertension, gastric bypass presenting with complaints of exertional dyspnea and chest pain for several weeks, abdominal pain, and also has new diffuse edema.  Chest x-ray negative for acute finding.  CT abdomen pelvis showing pulmonary edema, small right pleural effusion, and generalized anasarca.  No acute intra-abdominal finding.  Patient is not hypoxic.  BNP 777.  Troponin 895 and EKG showing ?new LBBB but no STEMI.  COVID/influenza/RSV negative.  Potassium 2.6.  Case was discussed with Dr. Radford Pax with cardiology who recommended echocardiogram, trending troponin, and starting IV heparin.  Patient also received IV Lasix 40 mg and IV potassium.  TRH will assume care on arrival to accepting facility. Until arrival, care as per EDP. However, TRH available 24/7 for questions and assistance.  Nursing staff, please page [facility] and Consults [phone]) as soon as the patient arrives to the hospital.

## 2022-11-30 NOTE — Progress Notes (Addendum)
ANTICOAGULATION CONSULT NOTE - Initial Consult  Pharmacy Consult for heparin Indication: chest pain/ACS  Allergies  Allergen Reactions   Penicillin G Itching   Penicillins Itching    Patient Measurements: Height: 5\' 5"  (165.1 cm) Weight: (!) 139.7 kg (308 lb) IBW/kg (Calculated) : 57 Heparin Dosing Weight: 100kg  Vital Signs: Temp: 98.2 F (36.8 C) (12/23 1910) Temp Source: Oral (12/23 1910) BP: 140/100 (12/23 2243) Pulse Rate: 102 (12/23 2243)  Labs: Recent Labs    11/30/22 1926 11/30/22 2120  HGB 10.3*  --   HCT 33.8*  --   PLT 360  --   CREATININE 1.15*  --   TROPONINIHS  --  895*    Estimated Creatinine Clearance: 77.7 mL/min (A) (by C-G formula based on SCr of 1.15 mg/dL (H)).   Medical History: Past Medical History:  Diagnosis Date   Allergy    seasonal   Anemia    Arthritis    right hip   Headache    sinus/allergies   Hypertension    Seasonal allergies    Vertigo    1-2x/month   Wears contact lenses     Assessment: 56yo female c/o intermittent chest discomfort associated w/ dyspnea, nausea, and swelling to BLE/abdomen, troponin found to be elevated, to begin heparin.  Goal of Therapy:  Heparin level 0.3-0.7 units/ml Monitor platelets by anticoagulation protocol: Yes   Plan:  Heparin 4000 units IV bolus x1 followed by infusion at 1400 units/hr. Monitor heparin levels and CBC.  Vernard Gambles, PharmD, BCPS  11/30/2022,10:58 PM   Addendum: Heparin level theraperutic at 0.59.  Will continue heparin at current rate and confirm stable with another level.  VB 12/01/2022 7:18 AM

## 2022-11-30 NOTE — ED Notes (Signed)
Pt ambulated to CT. SAT 99% on room air, HR 109. No distress noted

## 2022-11-30 NOTE — ED Provider Notes (Signed)
Stephanie Gates   CSN: [id number] Arrival date & time: 11/30/22  1857     History  Chief Complaint  Patient presents with   Shortness of Breath   Abdominal Pain   Chest Pain    Stephanie Gates is a 56 y.o. female.  The history is provided by the patient, medical records and a relative. No language interpreter was used.  Shortness of Breath Severity:  Moderate Onset quality:  Gradual Duration:  3 weeks Timing:  Intermittent Progression:  Waxing and waning Chronicity:  New Context: activity   Relieved by:  Rest Worsened by:  Exertion Ineffective treatments:  None tried Associated symptoms: abdominal pain, chest pain, cough and vomiting   Associated symptoms: no fever, no headaches, no neck pain, no rash, no sputum production, no syncope and no wheezing   Risk factors: no hx of PE/DVT   Abdominal Pain Pain location:  Generalized Pain quality: aching   Pain severity:  Moderate Onset quality:  Gradual Timing:  Intermittent Progression:  Waxing and waning Chronicity:  New Context: previous surgery   Relieved by:  Nothing Worsened by:  Nothing Ineffective treatments:  None tried Associated symptoms: chest pain, cough, fatigue, nausea, shortness of breath and vomiting   Associated symptoms: no chills, no constipation, no diarrhea, no dysuria, no fever, no flatus, no vaginal bleeding and no vaginal discharge   Chest Pain Pain location:  Substernal area Pain quality: aching, dull and pressure   Pain radiates to:  Does not radiate Pain severity:  Moderate Timing:  Intermittent Progression:  Waxing and waning Worsened by:  Exertion Associated symptoms: abdominal pain, cough, fatigue, nausea, shortness of breath and vomiting   Associated symptoms: no back pain, no fever, no headache, no palpitations and no syncope        Home Medications Prior to Admission medications   Medication Sig Start Date End Date Taking?  Authorizing Provider  benzonatate (TESSALON) 200 MG capsule Take 1 capsule (200 mg total) by mouth 2 (two) times daily as needed for cough. 11/15/22   Jonetta Osgood, NP  bisoprolol (ZEBETA) 5 MG tablet Take 1 tablet (5 mg total) by mouth daily. 11/15/22   Jonetta Osgood, NP  chlorpheniramine-HYDROcodone (TUSSIONEX) 10-8 MG/5ML Take 5 mLs by mouth at bedtime as needed for cough. 11/15/22   Jonetta Osgood, NP  Cyanocobalamin (VITAMIN B-12 PO) Take by mouth.    [provider]  cyclobenzaprine (FLEXERIL) 10 MG tablet Take 1 tablet (10 mg total) by mouth at bedtime. Take one tab po qhs for back spasm prn only 09/18/22   Jonetta Osgood, NP  hydrochlorothiazide (HYDRODIURIL) 25 MG tablet Take 1 tablet (25 mg total) by mouth daily. 08/21/22   Jonetta Osgood, NP  Liraglutide -Weight Management 18 MG/3ML SOPN Inject 3 mg into the skin daily. After completing titration to maintenance dose ('3mg'$ ) 07/24/22   Jonetta Osgood, NP  Multiple Vitamin (MULTIVITAMIN) capsule Take 1 capsule by mouth daily.    [provider]  pantoprazole (PROTONIX) 40 MG tablet TAKE 1 TABLET(40 MG) BY MOUTH DAILY 01/23/22   Jonetta Osgood, NP      Allergies    Penicillin g and Penicillins    Review of Systems   Review of Systems  Constitutional:  Positive for fatigue. Negative for chills and fever.  HENT:  Negative for congestion.   Eyes:  Negative for visual disturbance.  Respiratory:  Positive for cough, chest tightness and shortness of breath. Negative for sputum production and wheezing.  Cardiovascular:  Positive for chest pain and leg swelling. Negative for palpitations and syncope.  Gastrointestinal:  Positive for abdominal pain, nausea and vomiting. Negative for constipation, diarrhea and flatus.  Genitourinary:  Negative for dysuria, flank pain, vaginal bleeding and vaginal discharge.  Musculoskeletal:  Negative for back pain, neck pain and neck stiffness.  Skin:  Negative for rash and  wound.  Neurological:  Negative for light-headedness and headaches.  Psychiatric/Behavioral:  Negative for agitation and confusion.   All other systems reviewed and are negative.   Physical Exam Updated Vital Signs BP (!) 142/78 (BP Location: Left Wrist)   Pulse (!) 104   Temp 98.2 F (36.8 C) (Oral)   Resp 20   Ht '5\' 5"'$  (1.651 m)   Wt (!) 139.7 kg   LMP 02/11/2017   SpO2 99%   BMI 51.25 kg/m  Physical Exam Vitals and nursing Gates reviewed.  Constitutional:      General: She is not in acute distress.    Appearance: She is well-developed. She is not ill-appearing, toxic-appearing or diaphoretic.  HENT:     Head: Normocephalic and atraumatic.     Mouth/Throat:     Mouth: Mucous membranes are moist.  Eyes:     Conjunctiva/sclera: Conjunctivae normal.  Cardiovascular:     Rate and Rhythm: Normal rate and regular rhythm.     Heart sounds: No murmur heard.    Comments: Normal heart rate on my exam Pulmonary:     Effort: Pulmonary effort is normal. Tachypnea present. No respiratory distress.     Breath sounds: Rhonchi and rales present.  Chest:     Chest wall: No tenderness.  Abdominal:     Palpations: Abdomen is soft.     Tenderness: There is no abdominal tenderness.  Musculoskeletal:        General: No swelling.     Cervical back: Neck supple.     Right lower leg: No edema.     Left lower leg: Edema present.  Skin:    General: Skin is warm and dry.     Capillary Refill: Capillary refill takes less than 2 seconds.     Findings: No erythema.  Neurological:     General: No focal deficit present.     Mental Status: She is alert.  Psychiatric:        Mood and Affect: Mood normal.     ED Results / Procedures / Treatments   Labs (all labs ordered are listed, but only abnormal results are displayed) Labs Reviewed  COMPREHENSIVE METABOLIC PANEL - Abnormal; Notable for the following components:      Result Value   Potassium 2.6 (*)    Glucose, Bld 117 (*)     Creatinine, Ser 1.15 (*)    Calcium 8.8 (*)    Albumin 3.3 (*)    ALT 48 (*)    Total Bilirubin 1.5 (*)    GFR, Estimated 56 (*)    All other components within normal limits  CBC - Abnormal; Notable for the following components:   WBC 11.1 (*)    Hemoglobin 10.3 (*)    HCT 33.8 (*)    MCV 71.0 (*)    MCH 21.6 (*)    RDW 17.7 (*)    All other components within normal limits  URINALYSIS, ROUTINE W REFLEX MICROSCOPIC - Abnormal; Notable for the following components:   Protein, ur 100 (*)    All other components within normal limits  URINALYSIS, MICROSCOPIC (REFLEX) - Abnormal; Notable for  the following components:   Bacteria, UA RARE (*)    All other components within normal limits  BRAIN NATRIURETIC PEPTIDE - Abnormal; Notable for the following components:   B Natriuretic Peptide 777.6 (*)    All other components within normal limits  MAGNESIUM - Abnormal; Notable for the following components:   Magnesium 1.6 (*)    All other components within normal limits  TROPONIN I (HIGH SENSITIVITY) - Abnormal; Notable for the following components:   Troponin I (High Sensitivity) 895 (*)    All other components within normal limits  RESP PANEL BY RT-PCR (RSV, FLU A&B, COVID)  RVPGX2  LIPASE, BLOOD  PREGNANCY, URINE  TSH  HEPARIN LEVEL (UNFRACTIONATED)  TROPONIN I (HIGH SENSITIVITY)    EKG EKG Interpretation  Date/Time:  Saturday November 30 2022 19:15:35 EST Ventricular Rate:  103 PR Interval:  184 QRS Duration: 140 QT Interval:  400 QTC Calculation: 524 R Axis:   169 Text Interpretation: Sinus tachycardia with occasional Premature ventricular complexes and Fusion complexes Right axis deviation Non-specific intra-ventricular conduction block Abnormal ECG When compared with ECG of 15-Nov-2012 16:18, PREVIOUS ECG IS PRESENT when compared to prior, faster rate and new conduction block. NO STEMI Confirmed by Antony Blackbird [phone]) on 11/30/2022 8:50:46 PM  Radiology CT ABDOMEN  PELVIS W CONTRAST  Result Date: 11/30/2022 CLINICAL DATA:  Abdominal pain acute. Previous gastric bypass surgery. Nausea vomiting and abdominal distention. CT abdomen reading right this lady at black man open the exam sweats going edema at its edema amnion head bowel is fine and bowel fine SOB and abdominal fat there is that band is no edema in the belly fat is normal both lower lobes pulmonary edema in the bilateral lower lobes correct also at I am looking at again [facility] carefully is seeing about the anastomosis urinary bladder edema to dad that early EXAM: CT ABDOMEN AND PELVIS WITH CONTRAST TECHNIQUE: Multidetector CT imaging of the abdomen and pelvis was performed using the standard protocol following bolus administration of intravenous contrast. RADIATION DOSE REDUCTION: This exam was performed according to the departmental dose-optimization program which includes automated exposure control, adjustment of the mA and/or kV according to patient size and/or use of iterative reconstruction technique. CONTRAST:  169m OMNIPAQUE IOHEXOL 300 MG/ML  SOLN COMPARISON:  None Available. FINDINGS: Lower chest: Pulmonary vascular congestion and ground-glass opacities in bilateral lower lobes suggesting pulmonary edema. Small right pleural effusion. Hepatobiliary: No focal liver abnormality is seen. No gallstones or biliary ductal dilatation. Mild edema about the gallbladder. Pancreas: Generalized pancreatic atrophy. No pancreatic ductal dilatation. Spleen: Normal in size without focal abnormality. Adrenals/Urinary Tract: Adrenal glands are unremarkable. Kidneys are normal, without renal calculi, focal lesion, or hydronephrosis. Bladder is unremarkable. Stomach/Bowel: Stomach is within normal limits. Postsurgical changes for prior gastric bypass surgery. No abdominal fluid collection or extraluminal free air. Bowel loops are normal in caliber. Appendix appears normal. No evidence of bowel wall thickening, distention, or  inflammatory changes. Vascular/Lymphatic: No significant vascular findings are present. No enlarged abdominal or pelvic lymph nodes. Reproductive: Uterus and bilateral adnexa are unremarkable. Other: Marked generalized anasarca and skin thickening. Trace amount of fluid in the dependent pelvis. 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 No acute osseous abnormality. IMPRESSION: 1. Pulmonary vascular congestion and ground-glass opacities in bilateral lower lobes suggesting pulmonary edema. Small right pleural effusion. 2. Marked generalized anasarca and skin thickening. This is likely secondary to heart failure. Clinical correlation is suggested. 3. Postsurgical changes for prior gastric bypass  surgery. No evidence of bowel obstruction. No abdominal fluid collection or extraluminal free air. 4. 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 No acute osseous abnormality. Electronically Signed   By: Keane Police D.O.   On: 11/30/2022 22:21   DG Chest 2 View  Result Date: 11/30/2022 CLINICAL DATA:  Shortness of breath EXAM: CHEST - 2 VIEW COMPARISON:  Chest x-ray dated October 10, 2022 FINDINGS: Unchanged cardiomegaly. Mild linear opacity of the left upper lobe, similar to prior and likely due to scarring or atelectasis. Lungs are otherwise clear. No overt pulmonary edema. No pleural effusion or pneumothorax. IMPRESSION: No active cardiopulmonary disease. Electronically Signed   By: Yetta Glassman M.D.   On: 11/30/2022 19:50    Procedures Procedures    CRITICAL CARE Performed by: Gwenyth Allegra Takeyla Million Total critical care time: 40 minutes Critical care time was exclusive of separately billable procedures and treating other patients. Critical care was necessary to treat or prevent imminent or life-threatening deterioration. Critical care was time spent personally by me on the following activities: development of treatment plan with patient and/or  surrogate as well as nursing, discussions with consultants, evaluation of patient's response to treatment, examination of patient, obtaining history from patient or surrogate, ordering and performing treatments and interventions, ordering and review of laboratory studies, ordering and review of radiographic studies, pulse oximetry and re-evaluation of patient's condition.   Medications Ordered in ED Medications  albuterol (VENTOLIN HFA) 108 (90 Base) MCG/ACT inhaler 2 puff (has no administration in time range)  potassium chloride 10 mEq in 100 mL IVPB (has no administration in time range)  furosemide (LASIX) injection 40 mg (has no administration in time range)  iohexol (OMNIPAQUE) 300 MG/ML solution 100 mL (100 mLs Intravenous Contrast Given 11/30/22 2158)    ED Course/ Medical Decision Making/ A&P                           Medical Decision Making Amount and/or Complexity of Data Reviewed Labs: ordered. Radiology: ordered.  Risk Prescription drug management. Decision regarding hospitalization.    Murle Hellstrom is a 56 y.o. female with a past medical history significant for vertigo, hypertension, gastric bypass surgery, chronic headaches, osteoarthritis, and previous nontoxic goiter presents with worsening peripheral edema, abdominal bloating, exertional chest pain and shortness of breath, inability to lay flat, abdominal pain, nausea, and vomiting.  According to patient, for the last few weeks she has been having worsening edema in both of her legs and feels like her abdomen is swelling as well.  She feels that she is getting more and more short of breath and cannot lay flat like she normally could.  She was able to walk around without difficulty but is now getting very winded.  She is getting more short of breath.  She is also having an aching chest discomfort that is exertional.  It is not pleuritic.  She reports that she has had nausea and vomiting almost every day for the last  few weeks.  She reports she is having abdominal cramping that is moderate and reports she chronically has constipation and diarrhea troubles.  They do not seem to be different today compared to recently.  She denies any trauma and denies any rashes.  Denies any personal or family history of acute DVT or PE and is unsure about cardiac family history.  She denies any history of fluid overload or heart failure herself.  On exam, patient is quite  edematous in both legs.  She has intact sensation and strength however.  Abdomen is nontender but is distended per patient.  Bowel sounds are appreciated.  Lungs had rhonchi and rales but no significant wheezing.  No murmur initially.  Good pulses in extremities.  Patient resting.  Clinically patient's description of symptoms sounds like new heart failure with peripheral edema, orthopnea, and exertional chest pain and shortness of breath.  We will get screening labs, chest x-ray, BNP, and troponins.  EKG appeared abnormal compared to prior.  With all the daily nausea and vomiting we will check for electrolyte disturbances.  With her fatigue we will check a TSH.  Due to the previous gastric bypass surgery with abdominal tension, nausea, and vomiting, will get a CT scan.  Clinically anticipate she may need admission given her worsening symptoms.  Workup continues to return.  I looked at the CT scan myself and I am concerned about significant edema.  I called radiology to review the images and also feel patient has significant edema throughout but otherwise no bowel obstruction seen.  There was generalized anasarca.  Troponin returned elevated at 895 and BNP elevated at 777.  Potassium was 2.6 and IV potassium was continued.  I spoke to Dr. Golden Hurter with cardiology who feels the patient needs to be given IV Lasix, started on heparin, and admitted to [facility] to medicine service for further management of suspected new heart failure and anasarca.  Given the  patient's lack of pleuritic discomfort we have low suspicion for acute pulm embolism.  Her symptoms are only exertional and she reports they have been going on for several weeks gradually worsening.  At rest she is on room air and has no chest pain or shortness of breath, just the edema.  Patient will be admitted to [facility] for further management.  Patient is agreement with this plan.         Final Clinical Impression(s) / ED Diagnoses Final diagnoses:  Hypervolemia, unspecified hypervolemia type  Pleural effusion  Peripheral edema  Exertional chest pain  Exertional shortness of breath  Elevated brain natriuretic peptide (BNP) level  Elevated troponin    Clinical Impression: 1. Hypervolemia, unspecified hypervolemia type   2. Pleural effusion   3. Peripheral edema   4. Exertional chest pain   5. Exertional shortness of breath   6. Elevated brain natriuretic peptide (BNP) level   7. Elevated troponin     Disposition: Admit  This Gates was prepared with assistance of Dragon voice recognition software. Occasional wrong-word or sound-a-like substitutions may have occurred due to the inherent limitations of voice recognition software.      Margaretmary Prisk, Gwenyth Allegra, MD 11/30/22 [phone]

## 2022-11-30 NOTE — ED Triage Notes (Addendum)
Pt with dyspnea, nausea, and swelling to abdomen, legs, and feet. Pt also reports pain in abdomen. Pt reports she had pneumonia 2 months ago and feels she has not gotten much better. Denies hx of CHF, asthma. No fevers at home. Intermittent discomfort to chest that feels like pressure on the RT side x 2 months.

## 2022-12-01 ENCOUNTER — Other Ambulatory Visit: Payer: Self-pay

## 2022-12-01 ENCOUNTER — Encounter (HOSPITAL_COMMUNITY): Payer: Self-pay | Admitting: Internal Medicine

## 2022-12-01 DIAGNOSIS — I371 Nonrheumatic pulmonary valve insufficiency: Secondary | ICD-10-CM | POA: Diagnosis present

## 2022-12-01 DIAGNOSIS — R7303 Prediabetes: Secondary | ICD-10-CM | POA: Diagnosis present

## 2022-12-01 DIAGNOSIS — I959 Hypotension, unspecified: Secondary | ICD-10-CM | POA: Diagnosis not present

## 2022-12-01 DIAGNOSIS — I340 Nonrheumatic mitral (valve) insufficiency: Secondary | ICD-10-CM | POA: Diagnosis not present

## 2022-12-01 DIAGNOSIS — Z8701 Personal history of pneumonia (recurrent): Secondary | ICD-10-CM | POA: Diagnosis not present

## 2022-12-01 DIAGNOSIS — Z880 Allergy status to penicillin: Secondary | ICD-10-CM | POA: Diagnosis not present

## 2022-12-01 DIAGNOSIS — I071 Rheumatic tricuspid insufficiency: Secondary | ICD-10-CM | POA: Diagnosis not present

## 2022-12-01 DIAGNOSIS — I5A Non-ischemic myocardial injury (non-traumatic): Secondary | ICD-10-CM | POA: Diagnosis not present

## 2022-12-01 DIAGNOSIS — I509 Heart failure, unspecified: Secondary | ICD-10-CM | POA: Diagnosis not present

## 2022-12-01 DIAGNOSIS — I5021 Acute systolic (congestive) heart failure: Secondary | ICD-10-CM | POA: Diagnosis present

## 2022-12-01 DIAGNOSIS — Z833 Family history of diabetes mellitus: Secondary | ICD-10-CM | POA: Diagnosis not present

## 2022-12-01 DIAGNOSIS — I4729 Other ventricular tachycardia: Secondary | ICD-10-CM | POA: Diagnosis not present

## 2022-12-01 DIAGNOSIS — N179 Acute kidney failure, unspecified: Secondary | ICD-10-CM | POA: Diagnosis not present

## 2022-12-01 DIAGNOSIS — Y828 Other medical devices associated with adverse incidents: Secondary | ICD-10-CM | POA: Diagnosis not present

## 2022-12-01 DIAGNOSIS — I5082 Biventricular heart failure: Secondary | ICD-10-CM | POA: Diagnosis present

## 2022-12-01 DIAGNOSIS — T82897A Other specified complication of cardiac prosthetic devices, implants and grafts, initial encounter: Secondary | ICD-10-CM | POA: Diagnosis not present

## 2022-12-01 DIAGNOSIS — I428 Other cardiomyopathies: Secondary | ICD-10-CM | POA: Diagnosis not present

## 2022-12-01 DIAGNOSIS — Z6841 Body Mass Index (BMI) 40.0 and over, adult: Secondary | ICD-10-CM | POA: Diagnosis not present

## 2022-12-01 DIAGNOSIS — I472 Ventricular tachycardia, unspecified: Secondary | ICD-10-CM | POA: Diagnosis present

## 2022-12-01 DIAGNOSIS — E785 Hyperlipidemia, unspecified: Secondary | ICD-10-CM | POA: Diagnosis present

## 2022-12-01 DIAGNOSIS — I5023 Acute on chronic systolic (congestive) heart failure: Secondary | ICD-10-CM | POA: Diagnosis present

## 2022-12-01 DIAGNOSIS — E876 Hypokalemia: Secondary | ICD-10-CM | POA: Diagnosis present

## 2022-12-01 DIAGNOSIS — Z1152 Encounter for screening for COVID-19: Secondary | ICD-10-CM | POA: Diagnosis not present

## 2022-12-01 DIAGNOSIS — E1169 Type 2 diabetes mellitus with other specified complication: Secondary | ICD-10-CM | POA: Diagnosis present

## 2022-12-01 DIAGNOSIS — I447 Left bundle-branch block, unspecified: Secondary | ICD-10-CM | POA: Diagnosis present

## 2022-12-01 DIAGNOSIS — R109 Unspecified abdominal pain: Secondary | ICD-10-CM | POA: Diagnosis not present

## 2022-12-01 DIAGNOSIS — I214 Non-ST elevation (NSTEMI) myocardial infarction: Secondary | ICD-10-CM

## 2022-12-01 DIAGNOSIS — N170 Acute kidney failure with tubular necrosis: Secondary | ICD-10-CM | POA: Diagnosis not present

## 2022-12-01 DIAGNOSIS — E8770 Fluid overload, unspecified: Secondary | ICD-10-CM | POA: Diagnosis not present

## 2022-12-01 DIAGNOSIS — I110 Hypertensive heart disease with heart failure: Secondary | ICD-10-CM | POA: Diagnosis present

## 2022-12-01 DIAGNOSIS — D509 Iron deficiency anemia, unspecified: Secondary | ICD-10-CM | POA: Diagnosis present

## 2022-12-01 DIAGNOSIS — Z888 Allergy status to other drugs, medicaments and biological substances status: Secondary | ICD-10-CM | POA: Diagnosis not present

## 2022-12-01 DIAGNOSIS — Z8249 Family history of ischemic heart disease and other diseases of the circulatory system: Secondary | ICD-10-CM | POA: Diagnosis not present

## 2022-12-01 DIAGNOSIS — I10 Essential (primary) hypertension: Secondary | ICD-10-CM | POA: Diagnosis not present

## 2022-12-01 DIAGNOSIS — Z9884 Bariatric surgery status: Secondary | ICD-10-CM | POA: Diagnosis not present

## 2022-12-01 LAB — TROPONIN I (HIGH SENSITIVITY): Troponin I (High Sensitivity): 720 ng/L (ref <18)

## 2022-12-01 LAB — CBC
HCT: 34.3 % — ABNORMAL LOW (ref 36.0–46.0)
Hemoglobin: 10.5 g/dL — ABNORMAL LOW (ref 12.0–15.0)
MCH: 21.8 pg — ABNORMAL LOW (ref 26.0–34.0)
MCHC: 30.6 g/dL (ref 30.0–36.0)
MCV: 71.3 fL — ABNORMAL LOW (ref 80.0–100.0)
Platelets: 357 10*3/uL (ref 150–400)
RBC: 4.81 MIL/uL (ref 3.87–5.11)
RDW: 17.9 % — ABNORMAL HIGH (ref 11.5–15.5)
WBC: 10.8 10*3/uL — ABNORMAL HIGH (ref 4.0–10.5)
nRBC: 0 % (ref 0.0–0.2)

## 2022-12-01 LAB — BASIC METABOLIC PANEL
Anion gap: 11 (ref 5–15)
BUN: 14 mg/dL (ref 6–20)
CO2: 28 mmol/L (ref 22–32)
Calcium: 9.1 mg/dL (ref 8.9–10.3)
Chloride: 102 mmol/L (ref 98–111)
Creatinine, Ser: 1.22 mg/dL — ABNORMAL HIGH (ref 0.44–1.00)
GFR, Estimated: 52 mL/min — ABNORMAL LOW (ref >60)
Glucose, Bld: 122 mg/dL — ABNORMAL HIGH (ref 70–99)
Potassium: 2.7 mmol/L — CL (ref 3.5–5.1)
Sodium: 141 mmol/L (ref 135–145)

## 2022-12-01 LAB — TSH: TSH: 3.251 u[IU]/mL (ref 0.350–4.500)

## 2022-12-01 LAB — SEDIMENTATION RATE: Sed Rate: 7 mm/hr (ref 0–22)

## 2022-12-01 LAB — C-REACTIVE PROTEIN: CRP: 2.5 mg/dL — ABNORMAL HIGH (ref <1.0)

## 2022-12-01 LAB — HIV ANTIBODY (ROUTINE TESTING W REFLEX): HIV Screen 4th Generation wRfx: NONREACTIVE

## 2022-12-01 LAB — MAGNESIUM: Magnesium: 1.6 mg/dL — ABNORMAL LOW (ref 1.7–2.4)

## 2022-12-01 LAB — HEPARIN LEVEL (UNFRACTIONATED)
Heparin Unfractionated: 0.59 IU/mL (ref 0.30–0.70)
Heparin Unfractionated: 0.49 IU/mL (ref 0.30–0.70)

## 2022-12-01 MED ORDER — SODIUM CHLORIDE 0.9% FLUSH
3.0000 mL | Freq: Two times a day (BID) | INTRAVENOUS | Status: DC
  Administered 2022-12-02 – 2022-12-11 (×9): 3 mL via INTRAVENOUS

## 2022-12-01 MED ORDER — ONDANSETRON HCL 4 MG PO TABS
4.0000 mg | ORAL_TABLET | Freq: Four times a day (QID) | ORAL | Status: DC | PRN
  Administered 2022-12-02 – 2022-12-07 (×4): 4 mg via ORAL
  Filled 2022-12-01 (×4): qty 1

## 2022-12-01 MED ORDER — ACETAMINOPHEN 325 MG PO TABS: 650.0000 mg | ORAL_TABLET | Freq: Four times a day (QID) | ORAL | Status: DC | PRN

## 2022-12-01 MED ORDER — POTASSIUM CHLORIDE CRYS ER 20 MEQ PO TBCR: 40.0000 meq | EXTENDED_RELEASE_TABLET | ORAL | Status: DC

## 2022-12-01 MED ORDER — POTASSIUM CHLORIDE CRYS ER 20 MEQ PO TBCR
40.0000 meq | EXTENDED_RELEASE_TABLET | ORAL | Status: AC
  Administered 2022-12-01 – 2022-12-02 (×3): 40 meq via ORAL
  Filled 2022-12-01 (×3): qty 2

## 2022-12-01 MED ORDER — PANTOPRAZOLE SODIUM 40 MG PO TBEC
40.0000 mg | DELAYED_RELEASE_TABLET | Freq: Every day | ORAL | Status: DC
  Administered 2022-12-01 – 2022-12-13 (×13): 40 mg via ORAL
  Filled 2022-12-01 (×13): qty 1

## 2022-12-01 MED ORDER — ALBUTEROL SULFATE (2.5 MG/3ML) 0.083% IN NEBU: 2.5000 mg | INHALATION_SOLUTION | RESPIRATORY_TRACT | Status: DC | PRN

## 2022-12-01 MED ORDER — OXYCODONE HCL 5 MG PO TABS
5.0000 mg | ORAL_TABLET | ORAL | Status: DC | PRN
  Administered 2022-12-06 – 2022-12-07 (×2): 5 mg via ORAL
  Filled 2022-12-01 (×3): qty 1

## 2022-12-01 MED ORDER — ONDANSETRON HCL 4 MG/2ML IJ SOLN
4.0000 mg | Freq: Four times a day (QID) | INTRAMUSCULAR | Status: DC | PRN
  Administered 2022-12-02 – 2022-12-06 (×7): 4 mg via INTRAVENOUS
  Filled 2022-12-01 (×7): qty 2

## 2022-12-01 MED ORDER — POLYETHYLENE GLYCOL 3350 17 G PO PACK: 17.0000 g | PACK | Freq: Every day | ORAL | Status: DC | PRN

## 2022-12-01 MED ORDER — ACETAMINOPHEN 650 MG RE SUPP: 650.0000 mg | Freq: Four times a day (QID) | RECTAL | Status: DC | PRN

## 2022-12-01 MED ORDER — ASPIRIN 81 MG PO CHEW
81.0000 mg | CHEWABLE_TABLET | Freq: Every day | ORAL | Status: DC
  Administered 2022-12-01 – 2022-12-13 (×13): 81 mg via ORAL
  Filled 2022-12-01 (×14): qty 1

## 2022-12-01 MED ORDER — TRAZODONE HCL 50 MG PO TABS
25.0000 mg | ORAL_TABLET | Freq: Every evening | ORAL | Status: DC | PRN
  Administered 2022-12-03 – 2022-12-10 (×7): 25 mg via ORAL
  Filled 2022-12-01 (×8): qty 1

## 2022-12-01 MED ORDER — ATORVASTATIN CALCIUM 40 MG PO TABS
40.0000 mg | ORAL_TABLET | Freq: Every day | ORAL | Status: DC
  Administered 2022-12-01 – 2022-12-13 (×13): 40 mg via ORAL
  Filled 2022-12-01 (×13): qty 1

## 2022-12-01 MED ORDER — BISACODYL 5 MG PO TBEC
5.0000 mg | DELAYED_RELEASE_TABLET | Freq: Every day | ORAL | Status: DC | PRN
  Administered 2022-12-08 – 2022-12-12 (×2): 5 mg via ORAL
  Filled 2022-12-01 (×2): qty 1

## 2022-12-01 MED ORDER — MAGNESIUM SULFATE 2 GM/50ML IV SOLN
2.0000 g | Freq: Once | INTRAVENOUS | Status: AC
  Administered 2022-12-01: 2 g via INTRAVENOUS
  Filled 2022-12-01: qty 50

## 2022-12-01 MED ORDER — SPIRONOLACTONE 25 MG PO TABS
25.0000 mg | ORAL_TABLET | Freq: Every day | ORAL | Status: DC
  Administered 2022-12-01: 25 mg via ORAL
  Filled 2022-12-01: qty 1

## 2022-12-01 MED ORDER — DOCUSATE SODIUM 100 MG PO CAPS
100.0000 mg | ORAL_CAPSULE | Freq: Two times a day (BID) | ORAL | Status: DC
  Administered 2022-12-01 – 2022-12-13 (×15): 100 mg via ORAL
  Filled 2022-12-01 (×21): qty 1

## 2022-12-01 MED ORDER — GUAIFENESIN-DM 100-10 MG/5ML PO SYRP
5.0000 mL | ORAL_SOLUTION | ORAL | Status: DC | PRN
  Administered 2022-12-01: 5 mL via ORAL
  Filled 2022-12-01 (×2): qty 5

## 2022-12-01 MED ORDER — FUROSEMIDE 10 MG/ML IJ SOLN: 40.0000 mg | Freq: Two times a day (BID) | INTRAMUSCULAR | Status: DC

## 2022-12-01 MED ORDER — HYDRALAZINE HCL 20 MG/ML IJ SOLN: 5.0000 mg | INTRAMUSCULAR | Status: DC | PRN

## 2022-12-01 NOTE — Consult Note (Signed)
Cardiology Consultation   Patient ID: Stephanie Gates MRN: [id number]; DOB: 1965/12/21  Admit date: 11/30/2022 Date of Consult: 12/01/2022  PCP:  Jonetta Osgood, NP   [place] Prov[facility] Cardiologist:  New to [facility]   Patient Profile:   Stephanie Gates is a 56 y.o. female with a hx of hypertension, gastric bypass surgery, and history of goiter who is being seen 12/01/2022 for the evaluation of acute CHF exacerbation at the request of Dr. Lorin Mercy.  History of Present Illness:   Stephanie Gates is a obese 56 year old female with past medical history of hypertension, gastric bypass surgery, and history of goiter.  Mother has a history of hypertension, DM 2, congestive heart failure and renal issue.  Twin brother also has renal issue as well.  She does not drink alcohol, does not do any illicit drug and has never smoked.  She was in her usual state of health until roughly 3 months ago when she was diagnosed with bronchitis.  2 weeks later, she was diagnosed with pneumonia and received a course of antibiotic.  She had a lot of chest discomfort at the time especially with coughing.  She says the medication for the pneumonia made her symptom better for roughly 2 days before the shortness of breath came back.  Especially in the past 3 weeks, she has been noticing increasing lower extremity edema, abdominal distention, nausea, shortness of breath and orthopnea.  She still has occasional right-sided chest discomfort, however it does not occur with physical exertion.  In the past week, she has also been experiencing PND episodes.  Her PCP took her off of amlodipine and placed her on bisoprolol to see if her lower extremity edema improves.  She is also on hydrochlorothiazide 25 mg daily at home.  She eventually sought medical attention at [facility] on 11/30/2022.  Significant blood work on arrival include potassium of 2.6.  Creatinine of 1.15.  Albumin borderline low at 3.3.   BNP 777.  Magnesium 1.6.  White blood cell count 11.1, hemoglobin 10.3.  High RDW was low MCH and MCHC.  TSH was normal.  Serial troponin 895-->917--> 720..  Although initial chest x-ray showed no acute finding, however CT of the abdomen revealed pulmonary vascular congestion and a groundglass opacity in bilateral lower lobe suggesting pulmonary edema, small right pleural effusion, generalized anasarca and skin thickening secondary to heart failure.  Patient was given 40 mg IV Lasix yesterday and put out 2.7 L of urine.  Cardiology service was consulted for heart failure.  EKG showed sinus rhythm with LBBB.   Past Medical History:  Diagnosis Date   Allergy    seasonal   Anemia    Arthritis    right hip   Gastritis    Headache    sinus/allergies   Hypertension    Seasonal allergies    Vertigo    1-2x/month   Wears contact lenses     Past Surgical History:  Procedure Laterality Date   COLONOSCOPY N/A 05/03/2015   Procedure: COLONOSCOPY;  Surgeon: Lucilla Lame, MD;  Location: [place];  Service: Gastroenterology;  Laterality: N/A;   COLONOSCOPY     COLONOSCOPY WITH PROPOFOL N/A 02/22/2021   Procedure: COLONOSCOPY WITH PROPOFOL;  Surgeon: Lucilla Lame, MD;  Location: [facility] ENDOSCOPY;  Service: Endoscopy;  Laterality: N/A;   ESOPHAGOGASTRODUODENOSCOPY N/A 05/03/2015   Procedure: ESOPHAGOGASTRODUODENOSCOPY (EGD);  Surgeon: Lucilla Lame, MD;  Location: [place];  Service: Gastroenterology;  Laterality: N/A;   FOOT SURGERY  2014   Gastric bypass surgery     POLYPECTOMY  05/03/2015   Procedure: POLYPECTOMY INTESTINAL;  Surgeon: Lucilla Lame, MD;  Location: [place];  Service: Gastroenterology;;     Home Medications:  Prior to Admission medications   Medication Sig Start Date End Date Taking? Authorizing Provider  benzonatate (TESSALON) 200 MG capsule Take 1 capsule (200 mg total) by mouth 2 (two) times daily as needed for cough. 11/15/22   Jonetta Osgood, NP   bisoprolol (ZEBETA) 5 MG tablet Take 1 tablet (5 mg total) by mouth daily. 11/15/22   Jonetta Osgood, NP  chlorpheniramine-HYDROcodone (TUSSIONEX) 10-8 MG/5ML Take 5 mLs by mouth at bedtime as needed for cough. 11/15/22   Jonetta Osgood, NP  Cyanocobalamin (VITAMIN B-12 PO) Take by mouth.    [provider]  cyclobenzaprine (FLEXERIL) 10 MG tablet Take 1 tablet (10 mg total) by mouth at bedtime. Take one tab po qhs for back spasm prn only 09/18/22   Jonetta Osgood, NP  hydrochlorothiazide (HYDRODIURIL) 25 MG tablet Take 1 tablet (25 mg total) by mouth daily. 08/21/22   Jonetta Osgood, NP  Liraglutide -Weight Management 18 MG/3ML SOPN Inject 3 mg into the skin daily. After completing titration to maintenance dose (31m) 07/24/22   AJonetta Osgood NP  Multiple Vitamin (MULTIVITAMIN) capsule Take 1 capsule by mouth daily.    [provider]  pantoprazole (PROTONIX) 40 MG tablet TAKE 1 TABLET(40 MG) BY MOUTH DAILY 01/23/22   AJonetta Osgood NP    Inpatient Medications: Scheduled Meds:  aspirin  81 mg Oral Daily   atorvastatin  40 mg Oral Daily   docusate sodium  100 mg Oral BID   potassium chloride  40 mEq Oral Q4H   sodium chloride flush  3 mL Intravenous Q12H   spironolactone  25 mg Oral Daily   Continuous Infusions:  heparin 1,400 Units/hr (12/01/22 1519)   magnesium sulfate bolus IVPB     PRN Meds: acetaminophen **OR** acetaminophen, albuterol, bisacodyl, hydrALAZINE, ondansetron **OR** ondansetron (ZOFRAN) IV, oxyCODONE, polyethylene glycol, traZODone  Allergies:    Allergies  Allergen Reactions   Bisoprolol Diarrhea   Penicillin G Itching   Penicillins Itching    Social History:   Social History   Socioeconomic History   Marital status: Single    Spouse name: Not on file   Number of children: Not on file   Years of education: Not on file   Highest education level: Not on file  Occupational History   Occupation: Training specialist   Tobacco Use   Smoking status: Never   Smokeless tobacco: Never  Vaping Use   Vaping Use: Never used  Substance and Sexual Activity   Alcohol use: No   Drug use: No   Sexual activity: Not Currently  Other Topics Concern   Not on file  Social History Narrative   Not on file   Social Determinants of Health   Financial Resource Strain: Not on file  Food Insecurity: Not on file  Transportation Needs: Not on file  Physical Activity: Not on file  Stress: Not on file  Social Connections: Not on file  Intimate Partner Violence: Not on file    Family History:    Family History  Problem Relation Age of Onset   Heart failure Mother    Diabetes Mother    Hypertension Mother    Cancer Father    Diabetes Father    Hypertension Father    Heart failure Brother 585    ROS:  Please see the history of present illness.   All other ROS reviewed and negative.     Physical Exam/Data:   Vitals:   12/01/22 1222 12/01/22 1340 12/01/22 1446 12/01/22 1634  BP: 133/88 (!) 141/86 (!) 129/90 129/84  Pulse: 93 (!) 101 87 93  Resp: _0 Temp:  98.3 F (36.8 C) 98.1 F (36.7 C) 98 F (36.7 C)  TempSrc:  Oral Oral Oral  SpO2: 99% 99% 100% 99%  Weight:      Height:        Intake/Output Summary (Last 24 hours) at 12/01/2022 1650 Last data filed at 12/01/2022 1519 Gross per 24 hour  Intake 460.49 ml  Output 2900 ml  Net -2439.51 ml      11/30/2022    7:13 PM 11/15/2022   11:15 AM 10/10/2022   10:50 AM  Last 3 Weights  Weight (lbs) 308 lb 306 lb 9.6 oz 290 lb 12.8 oz  Weight (kg) 139.708 kg 139.073 kg 131.906 kg     Body mass index is 51.25 kg/m.  General:  Well nourished, well developed, in no acute distress HEENT: normal Neck: no JVD Vascular: No carotid bruits; Distal pulses 2+ bilaterally Cardiac:  normal S1, S2; RRR; no murmur  Lungs:  clear to auscultation bilaterally, no wheezing, rhonchi or rales  Abd: soft, nontender, no hepatomegaly  Ext: 2+ edema in  bilateral lower extremity Musculoskeletal:  No deformities, BUE and BLE strength normal and equal Skin: warm and dry  Neuro:  CNs 2-12 intact, no focal abnormalities noted Psych:  Normal affect   EKG:  The EKG was personally reviewed and demonstrates: Sinus rhythm with bundle branch block Telemetry:  Telemetry was personally reviewed and demonstrates: Normal sinus rhythm,  Relevant CV Studies:  Pending echo  Laboratory Data:  High Sensitivity Troponin:   Recent Labs  Lab 11/30/22 2120 11/30/22 2357 12/01/22 0904  TROPONINIHS 895* 917* 720*     Chemistry Recent Labs  Lab 11/30/22 1926 11/30/22 2119 12/01/22 1519 12/01/22 1545  NA 138  --  141  --   K 2.6*  --  2.7*  --   CL 100  --  102  --   CO2 29  --  28  --   GLUCOSE 117*  --  122*  --   BUN 18  --  14  --   CREATININE 1.15*  --  1.22*  --   CALCIUM 8.8*  --  9.1  --   MG  --  1.6*  --  1.6*  GFRNONAA 56*  --  52*  --   ANIONGAP 9  --  11  --     Recent Labs  Lab 11/30/22 1926  PROT 6.9  ALBUMIN 3.3*  AST 38  ALT 48*  ALKPHOS 87  BILITOT 1.5*   Lipids No results for input(s): "CHOL", "TRIG", "HDL", "LABVLDL", "LDLCALC", "CHOLHDL" in the last 168 hours.  Hematology Recent Labs  Lab 11/30/22 1926 12/01/22 1519  WBC 11.1* 10.8*  RBC 4.76 4.81  HGB 10.3* 10.5*  HCT 33.8* 34.3*  MCV 71.0* 71.3*  MCH 21.6* 21.8*  MCHC 30.5 30.6  RDW 17.7* 17.9*  PLT 360 357   Thyroid  Recent Labs  Lab 11/30/22 2157  TSH 3.251    BNP Recent Labs  Lab 11/30/22 2119  BNP 777.6*    DDimer No results for input(s): "DDIMER" in the last 168 hours.   Radiology/Studies:  CT ABDOMEN PELVIS W CONTRAST  Result  Date: 11/30/2022 CLINICAL DATA:  Abdominal pain acute. Previous gastric bypass surgery. Nausea vomiting and abdominal distention. CT abdomen reading right this lady at black man open the exam sweats going edema at its edema amnion head bowel is fine and bowel fine SOB and abdominal fat there is that band  is no edema in the belly fat is normal both lower lobes pulmonary edema in the bilateral lower lobes correct also at I am looking at again [facility] carefully is seeing about the anastomosis urinary bladder edema to dad that early EXAM: CT ABDOMEN AND PELVIS WITH CONTRAST TECHNIQUE: Multidetector CT imaging of the abdomen and pelvis was performed using the standard protocol following bolus administration of intravenous contrast. RADIATION DOSE REDUCTION: This exam was performed according to the departmental dose-optimization program which includes automated exposure control, adjustment of the mA and/or kV according to patient size and/or use of iterative reconstruction technique. CONTRAST:  153m OMNIPAQUE IOHEXOL 300 MG/ML  SOLN COMPARISON:  None Available. FINDINGS: Lower chest: Pulmonary vascular congestion and ground-glass opacities in bilateral lower lobes suggesting pulmonary edema. Small right pleural effusion. Hepatobiliary: No focal liver abnormality is seen. No gallstones or biliary ductal dilatation. Mild edema about the gallbladder. Pancreas: Generalized pancreatic atrophy. No pancreatic ductal dilatation. Spleen: Normal in size without focal abnormality. Adrenals/Urinary Tract: Adrenal glands are unremarkable. Kidneys are normal, without renal calculi, focal lesion, or hydronephrosis. Bladder is unremarkable. Stomach/Bowel: Stomach is within normal limits. Postsurgical changes for prior gastric bypass surgery. No abdominal fluid collection or extraluminal free air. Bowel loops are normal in caliber. Appendix appears normal. No evidence of bowel wall thickening, distention, or inflammatory changes. Vascular/Lymphatic: No significant vascular findings are present. No enlarged abdominal or pelvic lymph nodes. Reproductive: Uterus and bilateral adnexa are unremarkable. Other: Marked generalized anasarca and skin thickening. Trace amount of fluid in the dependent pelvis. 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 No acute osseous abnormality. IMPRESSION: 1. Pulmonary vascular congestion and ground-glass opacities in bilateral lower lobes suggesting pulmonary edema. Small right pleural effusion. 2. Marked generalized anasarca and skin thickening. This is likely secondary to heart failure. Clinical correlation is suggested. 3. Postsurgical changes for prior gastric bypass surgery. No evidence of bowel obstruction. No abdominal fluid collection or extraluminal free air. 4. 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 No acute osseous abnormality. Electronically Signed   By: IKeane PoliceD.O.   On: 11/30/2022 22:21   DG Chest 2 View  Result Date: 11/30/2022 CLINICAL DATA:  Shortness of breath EXAM: CHEST - 2 VIEW COMPARISON:  Chest x-ray dated October 10, 2022 FINDINGS: Unchanged cardiomegaly. Mild linear opacity of the left upper lobe, similar to prior and likely due to scarring or atelectasis. Lungs are otherwise clear. No overt pulmonary edema. No pleural effusion or pneumothorax. IMPRESSION: No active cardiopulmonary disease. Electronically Signed   By: LYetta GlassmanM.D.   On: 11/30/2022 19:50     Assessment and Plan:   Acute CHF exacerbation  -Patient given 40 mg IV Lasix yesterday.  Breathing significantly improved.  BNP 777 on arrival.  Pending echocardiogram.  Will hold off on additional diuretic until potassium can be repleted.  -Add spironolactone.  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  Obtain ESR and CRP to rule out myocarditis.  -once potassium is repleted, likely will resume IV diuresis with Lasix tomorrow.  -Left and right heart cath once fully diuresed.  Severe hypokalemia: Home HCTZ stopped.  Potassium 2.7 this  afternoon.  Received 3 rounds of 10 mEq of potassium chloride IV last night.  Will order 40 mEq potassium chloride every 4 hours x 3 doses.  Elevated troponin: Serial  troponin 895->917-->720.  Will need a left and right heart cath for ischemic eval.  Mild anemia: Per primary team  Proteinuria: 100 protein noted in urinalysis.  Will add ACE inhibitor/ARB as BP allows  Hypertension: Will need initiation of GDMT.  Add beta-blocker once euvolemic.  Will also consider at ACE inhibitor/ARB/Entresto.  SGLT2 inhibitor.   Risk Assessment/Risk Scores:     TIMI Risk Score for Unstable Angina or Non-ST Elevation MI:   The patient's TIMI risk score is 1, which indicates a 5% risk of all cause mortality, new or recurrent myocardial infarction or need for urgent revascularization in the next 14 days.  New York Heart Association (NYHA) Functional Class NYHA Class IV        For questions or updates, please contact [place] Please consult [email or web address] for contact info under    Hilbert Corrigan, [place]  12/01/2022 4:50 PM

## 2022-12-01 NOTE — Progress Notes (Signed)
ANTICOAGULATION CONSULT NOTE  Pharmacy Consult for heparin Indication: chest pain/ACS  Allergies  Allergen Reactions   Bisoprolol Diarrhea   Penicillin G Itching   Penicillins Itching    Patient Measurements: Height: '5\' 5"'$  (165.1 cm) Weight: (!) 139.7 kg (308 lb) IBW/kg (Calculated) : 57 Heparin Dosing Weight: 100kg  Vital Signs: Temp: 98 F (36.7 C) (12/24 1634) Temp Source: Oral (12/24 1634) BP: 129/84 (12/24 1634) Pulse Rate: 93 (12/24 1634)  Labs: Recent Labs    11/30/22 1926 11/30/22 2120 11/30/22 2357 12/01/22 0550 12/01/22 0904 12/01/22 1519 12/01/22 1523  HGB 10.3*  --   --   --   --  10.5*  --   HCT 33.8*  --   --   --   --  34.3*  --   PLT 360  --   --   --   --  357  --   HEPARINUNFRC  --   --   --  0.59  --   --  0.49  CREATININE 1.15*  --   --   --   --  1.22*  --   TROPONINIHS  --  895* 917*  --  720*  --   --      Estimated Creatinine Clearance: 73.2 mL/min (A) (by C-G formula based on SCr of 1.22 mg/dL (H)).   Medical History: Past Medical History:  Diagnosis Date   Allergy    seasonal   Anemia    Arthritis    right hip   Gastritis    Headache    sinus/allergies   Hypertension    Seasonal allergies    Vertigo    1-2x/month   Wears contact lenses     Assessment: 56yo female c/o intermittent chest discomfort associated w/ dyspnea, nausea, and swelling to BLE/abdomen, troponin found to be elevated, to begin heparin.  Heparin level came back therapeutic at 0.49, on 1400 units/hr. CBC stable. No s/sx of bleeding or infusion issues.   Goal of Therapy:  Heparin level 0.3-0.7 units/ml Monitor platelets by anticoagulation protocol: Yes   Plan:  Continue heparin infusion at 1400 units/hr  Order heparin level in AM with labs  Monitor heparin levels and CBC.  Antonietta Jewel, PharmD, [place] Clinical Pharmacist  Phone: [phone] 12/01/2022 5:57 PM  Please check AMION for all [place] phone numbers After 10:00 PM, call [place] [phone]

## 2022-12-01 NOTE — Progress Notes (Signed)
Pt had critical result of potassium =2.7. MD notified. New orders received.   Lavenia Atlas, RN

## 2022-12-01 NOTE — H&P (Signed)
History and Physical    Patient: Stephanie Gates THY:[id number] DOB: 02/14/1966 DOA: 11/30/2022 DOS: the patient was seen and examined on 12/01/2022 PCP: Jonetta Osgood, NP  Patient coming from: Home - lives with daughter; Donald Prose: Daughter, [phone]   Chief Complaint: SOB, edema  HPI: Stephanie Gates is a 56 y.o. female with medical history significant of HTN and morbid obesity s/p gastric bypass presenting with SOB, edema.   She reports LE edema, extending up to her abdomen.  She was having difficulty breathing with exertion and with orthopnea.  +emesis, cough for about 3 months.  She had bronchitis -> PNA.  She had been to her PCP and had BP medications changed twice.  PNA was a little over a month ago.  She hasn't been able to eat much, feels like it gets stuck in her throat with early satiety.  Once the fluid came off in the ER, those symptoms have improved.  Cough has been productive of clear, frothy sputum.  No history of CHF.  When her coughing was its worst, she would have chest pain with coughing; that is not happening often now.    ER Course:  [facility] to [facility] transfer, per Dr. Marlowe Sax:  Exertional dyspnea and chest pain for several weeks, abdominal pain, and also has new diffuse edema.  Chest x-ray negative for acute finding.  CT abdomen pelvis showing pulmonary edema, small right pleural effusion, and generalized anasarca.  No acute intra-abdominal finding.  Patient is not hypoxic.  BNP 777.  Troponin 895 and EKG showing ?new LBBB but no STEMI.  COVID/influenza/RSV negative.  Potassium 2.6.  Case was discussed with Dr. Radford Pax with cardiology who recommended echocardiogram, trending troponin, and starting IV heparin.  Patient also received IV Lasix 40 mg and IV potassium.      Review of Systems: As mentioned in the history of present illness. All other systems reviewed and are negative. Past Medical History:  Diagnosis Date   Allergy    seasonal   Anemia    Arthritis     right hip   Gastritis    Headache    sinus/allergies   Hypertension    Seasonal allergies    Vertigo    1-2x/month   Wears contact lenses    Past Surgical History:  Procedure Laterality Date   COLONOSCOPY N/A 05/03/2015   Procedure: COLONOSCOPY;  Surgeon: Lucilla Lame, MD;  Location: [place];  Service: Gastroenterology;  Laterality: N/A;   COLONOSCOPY     COLONOSCOPY WITH PROPOFOL N/A 02/22/2021   Procedure: COLONOSCOPY WITH PROPOFOL;  Surgeon: Lucilla Lame, MD;  [facility] END[facility]COPY;  Service: Endoscopy;  Laterality: N/A;   ESOPHAGOGASTRODUODENOSCOPY N/A 05/03/2015   Procedure: ESOPHAGOGASTRODUODENOSCOPY (EGD);  Surgeon: Lucilla Lame, MD;  Location: [place];  Service: Gastroenterology;  Laterality: N/A;   FOOT SURGERY  2014   Gastric bypass surgery     POLYPECTOMY  05/03/2015   Procedure: POLYPECTOMY INTESTINAL;  Surgeon: Lucilla Lame, MD;  Location: [place];  Service: Gastroenterology;;   Social History:  reports that she has never smoked. She has never used smokeless tobacco. She reports that she does not drink alcohol and does not use drugs.  Allergies  Allergen Reactions   Bisoprolol Diarrhea   Penicillin G Itching   Penicillins Itching    Family History  Problem Relation Age of Onset   Diabetes Mother    Hypertension Mother    Cancer Father    Diabetes Father    Hypertension Father  Heart failure Brother 3    Prior to Admission medications   Medication Sig Start Date End Date Taking? Authorizing Provider  benzonatate (TESSALON) 200 MG capsule Take 1 capsule (200 mg total) by mouth 2 (two) times daily as needed for cough. 11/15/22   Jonetta Osgood, NP  bisoprolol (ZEBETA) 5 MG tablet Take 1 tablet (5 mg total) by mouth daily. 11/15/22   Jonetta Osgood, NP  chlorpheniramine-HYDROcodone (TUSSIONEX) 10-8 MG/5ML Take 5 mLs by mouth at bedtime as needed for cough. 11/15/22   Jonetta Osgood, NP  Cyanocobalamin (VITAMIN B-12  PO) Take by mouth.    [provider]  cyclobenzaprine (FLEXERIL) 10 MG tablet Take 1 tablet (10 mg total) by mouth at bedtime. Take one tab po qhs for back spasm prn only 09/18/22   Jonetta Osgood, NP  hydrochlorothiazide (HYDRODIURIL) 25 MG tablet Take 1 tablet (25 mg total) by mouth daily. 08/21/22   Jonetta Osgood, NP  Liraglutide -Weight Management 18 MG/3ML SOPN Inject 3 mg into the skin daily. After completing titration to maintenance dose ('3mg'$ ) 07/24/22   Jonetta Osgood, NP  Multiple Vitamin (MULTIVITAMIN) capsule Take 1 capsule by mouth daily.    [provider]  pantoprazole (PROTONIX) 40 MG tablet TAKE 1 TABLET(40 MG) BY MOUTH DAILY 01/23/22   Jonetta Osgood, NP    Physical Exam: Vitals:   12/01/22 0900 12/01/22 1222 12/01/22 1340 12/01/22 1446  BP: 122/75 133/88 (!) 141/86 (!) 129/90  Pulse: 95 93 (!) 101 87  Resp: '19 20 19 20  '$ Temp:   98.3 F (36.8 C) 98.1 F (36.7 C)  TempSrc:   Oral Oral  SpO2: 99% 99% 99% 100%  Weight:      Height:       General:  Appears calm and comfortable and is in NAD, sitting up on the side of the bed on RA (afraid to try to lie flat) Eyes:  EOMI, normal lids, iris ENT:  grossly normal hearing, lips; geographic tongue (?need for f/u given mild discoloration), mmm Neck:  no LAD, masses or thyromegaly Cardiovascular:  RRR, no m/r/g. 2-3+ LE edema.  Respiratory:   Scant RLL crackles.  Normal respiratory effort. Abdomen:  soft, NT, ND Skin:  no rash or induration seen on limited exam Musculoskeletal:  grossly normal tone BUE/BLE, good ROM, no bony abnormality Psychiatric:  grossly normal mood and affect, speech fluent and appropriate, AOx3 Neurologic:  CN 2-12 grossly intact, moves all extremities in coordinated fashion   Radiological Exams on Admission: Independently reviewed - see discussion in A/P where applicable  CT ABDOMEN PELVIS W CONTRAST  Result Date: 11/30/2022 CLINICAL DATA:  Abdominal pain acute.  Previous gastric bypass surgery. Nausea vomiting and abdominal distention. CT abdomen reading right this lady at black man open the exam sweats going edema at its edema amnion head bowel is fine and bowel fine SOB and abdominal fat there is that band is no edema in the belly fat is normal both lower lobes pulmonary edema in the bilateral lower lobes correct also at I am looking at again [facility] carefully is seeing about the anastomosis urinary bladder edema to dad that early EXAM: CT ABDOMEN AND PELVIS WITH CONTRAST TECHNIQUE: Multidetector CT imaging of the abdomen and pelvis was performed using the standard protocol following bolus administration of intravenous contrast. RADIATION DOSE REDUCTION: This exam was performed according to the departmental dose-optimization program which includes automated exposure control, adjustment of the mA and/or kV according to patient size and/or use of iterative reconstruction technique.  CONTRAST:  14m OMNIPAQUE IOHEXOL 300 MG/ML  SOLN COMPARISON:  None Available. FINDINGS: Lower chest: Pulmonary vascular congestion and ground-glass opacities in bilateral lower lobes suggesting pulmonary edema. Small right pleural effusion. Hepatobiliary: No focal liver abnormality is seen. No gallstones or biliary ductal dilatation. Mild edema about the gallbladder. Pancreas: Generalized pancreatic atrophy. No pancreatic ductal dilatation. Spleen: Normal in size without focal abnormality. Adrenals/Urinary Tract: Adrenal glands are unremarkable. Kidneys are normal, without renal calculi, focal lesion, or hydronephrosis. Bladder is unremarkable. Stomach/Bowel: Stomach is within normal limits. Postsurgical changes for prior gastric bypass surgery. No abdominal fluid collection or extraluminal free air. Bowel loops are normal in caliber. Appendix appears normal. No evidence of bowel wall thickening, distention, or inflammatory changes. Vascular/Lymphatic: No significant vascular findings are  present. No enlarged abdominal or pelvic lymph nodes. Reproductive: Uterus and bilateral adnexa are unremarkable. Other: Marked generalized anasarca and skin thickening. Trace amount of fluid in the dependent pelvis. 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 No acute osseous abnormality. IMPRESSION: 1. Pulmonary vascular congestion and ground-glass opacities in bilateral lower lobes suggesting pulmonary edema. Small right pleural effusion. 2. Marked generalized anasarca and skin thickening. This is likely secondary to heart failure. Clinical correlation is suggested. 3. Postsurgical changes for prior gastric bypass surgery. No evidence of bowel obstruction. No abdominal fluid collection or extraluminal free air. 4. 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 No acute osseous abnormality. Electronically Signed   By: IKeane PoliceD.O.   On: 11/30/2022 22:21   DG Chest 2 View  Result Date: 11/30/2022 CLINICAL DATA:  Shortness of breath EXAM: CHEST - 2 VIEW COMPARISON:  Chest x-ray dated October 10, 2022 FINDINGS: Unchanged cardiomegaly. Mild linear opacity of the left upper lobe, similar to prior and likely due to scarring or atelectasis. Lungs are otherwise clear. No overt pulmonary edema. No pleural effusion or pneumothorax. IMPRESSION: No active cardiopulmonary disease. Electronically Signed   By: LYetta GlassmanM.D.   On: 11/30/2022 19:50    EKG: Independently reviewed.  NSR with rate 95; prolonged QTc 542; IVCD; nonspecific ST changes with no evidence of acute ischemia   Labs on Admission: I have personally reviewed the available labs and imaging studies at the time of the admission.  Pertinent labs:    K+ 2.6 Glucose 117 BUN 18/Creatinine 1.15/GFR 56; 9/0.97/>60 on 04/15/22 Albumin 3.3 WBC 11.1 Hgb 10.3 Upreg negative UA: 100 protein COVID/flu/RSV negative TSH 3.251 BNP 777.6 Troponin 895, 917, 720   Assessment and  Plan: Principal Problem:   New onset of congestive heart failure (HCC)    New onset CHF -Patient without known h/o CHF presenting with worsening SOB and edema -CXR without edema but CT consistent with mild pulmonary edema and anasarca -Elevated BNP without known baseline -With elevated BNP and abnl CXR, acute decompensated CHF seems probable as diagnosis -Will admit, as per the Emergency HF Mortality Risk Grade.  The patient has: elevated troponin with concern for ACS -Will request echocardiogram - she may need cath per further characterization -[facility]; it has stabilized and is now downtrending and she has not had chest pain so this is likely associated with demand ischemia.  Cardiology is aware and can decide whether to dc.   -CHF order set utilized -Cardiology is consulted -Was given Lasix 40 mg x 1 in ER and will repeat with 40 mg IV BID -Continue [place] O2 for now -Slightly worsened kidney function  at this time, will follow  Hypokalemia -Recurrently low despite IV and PO KCl -Requests PO KCl only at this time  HTN -Previously on bisoprolol but stopped; will likely need carvedilol but will defer to cardiology -On HCTZ, will hold -Likely to benefit from spironolactone -Will also add prn hydralazine  HLD -Will start Lipitor -Check lipids  DM -Will check A1c -There is no indication to start medication at this time, but will cover with moderate-scale SSI for now  Obesity -Body mass index is 51.25 kg/m..  -She is s/p gastric bypass, has regained most if not all of the weight by her report -Weight loss should be encouraged -Outpatient PCP/bariatric medicine/bariatric surgery f/u encouraged      Advance Care Planning:   Code Status: Full Code - Code status was discussed with the patient and/or family at the time of admission.  The patient would want to receive full resuscitative measures at this time.   Consults:  Cardiology; CHF navigator; nutrition; PT/OT; TOC team  DVT Prophylaxis: Heparin drip  Family Communication: Daughter and sister were present throughout evaluation  Severity of Illness: The appropriate patient status for this patient is INPATIENT. Inpatient status is judged to be reasonable and necessary in order to provide the required intensity of service to ensure the patient's safety. The patient's presenting symptoms, physical exam findings, and initial radiographic and laboratory data in the context of their chronic comorbidities is felt to place them at high risk for further clinical deterioration. Furthermore, it is not anticipated that the patient will be medically stable for discharge from the hospital within 2 midnights of admission.   * I certify that at the point of admission it is my clinical judgment that the patient will require inpatient hospital care spanning beyond 2 midnights from the point of admission due to high intensity of service, high risk for further deterioration and high frequency of surveillance required.*  Author: Karmen Bongo, MD 12/01/2022 4:11 PM  For on call review [email or web address].

## 2022-12-01 NOTE — ED Provider Notes (Addendum)
Patient with increasing troponins.  Last 1 done at midnight at 917.  Not terribly significant from the 895 before.  But will have them repeat a third troponin.  Patient was persistent chest pain.  Frequent PVCs.  EKG repeated just now no significant change from previous.  Patient is awaiting admission to the hospitalist service with consultation by cardiology.   Fredia Sorrow, MD 12/01/22 [phone]   Patient's troponins trending down which is reassuring at 720.  Patient does have a bed assigned and hopefully will be receiving transport for admission soon.  Patient can have something to eat and drink.   Fredia Sorrow, MD 12/01/22 1212

## 2022-12-01 NOTE — ED Notes (Signed)
[facility] at bedside 

## 2022-12-02 ENCOUNTER — Inpatient Hospital Stay (HOSPITAL_COMMUNITY): Payer: 59

## 2022-12-02 DIAGNOSIS — I4729 Other ventricular tachycardia: Secondary | ICD-10-CM | POA: Diagnosis not present

## 2022-12-02 DIAGNOSIS — I10 Essential (primary) hypertension: Secondary | ICD-10-CM

## 2022-12-02 DIAGNOSIS — Z6841 Body Mass Index (BMI) 40.0 and over, adult: Secondary | ICD-10-CM

## 2022-12-02 DIAGNOSIS — E785 Hyperlipidemia, unspecified: Secondary | ICD-10-CM

## 2022-12-02 DIAGNOSIS — R601 Generalized edema: Secondary | ICD-10-CM

## 2022-12-02 DIAGNOSIS — E8770 Fluid overload, unspecified: Secondary | ICD-10-CM | POA: Diagnosis not present

## 2022-12-02 DIAGNOSIS — I509 Heart failure, unspecified: Secondary | ICD-10-CM | POA: Diagnosis not present

## 2022-12-02 DIAGNOSIS — E876 Hypokalemia: Secondary | ICD-10-CM | POA: Diagnosis not present

## 2022-12-02 DIAGNOSIS — I5021 Acute systolic (congestive) heart failure: Secondary | ICD-10-CM | POA: Diagnosis not present

## 2022-12-02 LAB — BASIC METABOLIC PANEL
Anion gap: 11 (ref 5–15)
Anion gap: 8 (ref 5–15)
BUN: 15 mg/dL (ref 6–20)
BUN: 15 mg/dL (ref 6–20)
CO2: 27 mmol/L (ref 22–32)
CO2: 26 mmol/L (ref 22–32)
Calcium: 9.3 mg/dL (ref 8.9–10.3)
Calcium: 9.2 mg/dL (ref 8.9–10.3)
Chloride: 101 mmol/L (ref 98–111)
Chloride: 104 mmol/L (ref 98–111)
Creatinine, Ser: 1.26 mg/dL — ABNORMAL HIGH (ref 0.44–1.00)
Creatinine, Ser: 1.18 mg/dL — ABNORMAL HIGH (ref 0.44–1.00)
GFR, Estimated: 50 mL/min — ABNORMAL LOW (ref >60)
GFR, Estimated: 54 mL/min — ABNORMAL LOW (ref >60)
Glucose, Bld: 124 mg/dL — ABNORMAL HIGH (ref 70–99)
Glucose, Bld: 115 mg/dL — ABNORMAL HIGH (ref 70–99)
Potassium: 3 mmol/L — ABNORMAL LOW (ref 3.5–5.1)
Potassium: 3.5 mmol/L (ref 3.5–5.1)
Sodium: 139 mmol/L (ref 135–145)
Sodium: 138 mmol/L (ref 135–145)

## 2022-12-02 LAB — TROPONIN I (HIGH SENSITIVITY)
Troponin I (High Sensitivity): 397 ng/L (ref <18)
Troponin I (High Sensitivity): 917 ng/L (ref <18)

## 2022-12-02 LAB — ECHOCARDIOGRAM COMPLETE
Area-P 1/2: 3.22 cm2
Calc EF: 20.5 %
Height: 65 in
MV M vel: 4.3 m/s
MV Peak grad: 74 mmHg
Radius: 0.5 cm
S' Lateral: 6.1 cm
Single Plane A2C EF: 22 %
Single Plane A4C EF: 22.6 %
Weight: 4910.09 oz

## 2022-12-02 LAB — CBC
HCT: 34.9 % — ABNORMAL LOW (ref 36.0–46.0)
Hemoglobin: 10.9 g/dL — ABNORMAL LOW (ref 12.0–15.0)
MCH: 22.2 pg — ABNORMAL LOW (ref 26.0–34.0)
MCHC: 31.2 g/dL (ref 30.0–36.0)
MCV: 70.9 fL — ABNORMAL LOW (ref 80.0–100.0)
Platelets: 373 10*3/uL (ref 150–400)
RBC: 4.92 MIL/uL (ref 3.87–5.11)
RDW: 17.8 % — ABNORMAL HIGH (ref 11.5–15.5)
WBC: 11.2 10*3/uL — ABNORMAL HIGH (ref 4.0–10.5)
nRBC: 0 % (ref 0.0–0.2)

## 2022-12-02 LAB — MAGNESIUM: Magnesium: 2.5 mg/dL — ABNORMAL HIGH (ref 1.7–2.4)

## 2022-12-02 LAB — HEPARIN LEVEL (UNFRACTIONATED): Heparin Unfractionated: 0.59 IU/mL (ref 0.30–0.70)

## 2022-12-02 MED ORDER — NITROGLYCERIN 0.4 MG SL SUBL
0.4000 mg | SUBLINGUAL_TABLET | SUBLINGUAL | Status: DC | PRN
  Administered 2022-12-02: 0.4 mg via SUBLINGUAL
  Filled 2022-12-02: qty 1

## 2022-12-02 MED ORDER — FUROSEMIDE 10 MG/ML IJ SOLN
60.0000 mg | Freq: Two times a day (BID) | INTRAMUSCULAR | Status: DC
  Administered 2022-12-02: 60 mg via INTRAVENOUS
  Filled 2022-12-02: qty 6

## 2022-12-02 MED ORDER — POTASSIUM CHLORIDE 10 MEQ/100ML IV SOLN
10.0000 meq | INTRAVENOUS | Status: AC
  Administered 2022-12-02 (×3): 10 meq via INTRAVENOUS
  Filled 2022-12-02 (×3): qty 100

## 2022-12-02 MED ORDER — POTASSIUM CHLORIDE 10 MEQ/100ML IV SOLN: 10.0000 meq | INTRAVENOUS | Status: DC

## 2022-12-02 MED ORDER — SPIRONOLACTONE 25 MG PO TABS
25.0000 mg | ORAL_TABLET | Freq: Every day | ORAL | Status: DC
  Administered 2022-12-02 – 2022-12-08 (×7): 25 mg via ORAL
  Filled 2022-12-02 (×8): qty 1

## 2022-12-02 MED ORDER — POTASSIUM CHLORIDE CRYS ER 20 MEQ PO TBCR
40.0000 meq | EXTENDED_RELEASE_TABLET | ORAL | Status: AC
  Administered 2022-12-02 (×3): 40 meq via ORAL
  Filled 2022-12-02 (×3): qty 2

## 2022-12-02 MED ORDER — PERFLUTREN LIPID MICROSPHERE: 1.0000 mL | INTRAVENOUS | Status: AC | PRN

## 2022-12-02 MED ORDER — PERFLUTREN LIPID MICROSPHERE
1.0000 mL | INTRAVENOUS | Status: AC | PRN
  Administered 2022-12-02: 4 mL via INTRAVENOUS

## 2022-12-02 MED ORDER — MAGNESIUM SULFATE 2 GM/50ML IV SOLN
2.0000 g | Freq: Once | INTRAVENOUS | Status: AC
  Administered 2022-12-02: 2 g via INTRAVENOUS
  Filled 2022-12-02: qty 50

## 2022-12-02 MED ORDER — ENSURE MAX PROTEIN PO LIQD
11.0000 [oz_av] | Freq: Two times a day (BID) | ORAL | Status: DC
  Administered 2022-12-02 – 2022-12-12 (×11): 11 [oz_av] via ORAL
  Filled 2022-12-02 (×24): qty 330

## 2022-12-02 MED ORDER — POTASSIUM CHLORIDE CRYS ER 20 MEQ PO TBCR
40.0000 meq | EXTENDED_RELEASE_TABLET | Freq: Two times a day (BID) | ORAL | Status: DC
  Administered 2022-12-02: 40 meq via ORAL
  Filled 2022-12-02: qty 2

## 2022-12-02 MED ORDER — ADULT MULTIVITAMIN W/MINERALS CH
1.0000 | ORAL_TABLET | Freq: Every day | ORAL | Status: DC
  Administered 2022-12-02 – 2022-12-12 (×10): 1 via ORAL
  Filled 2022-12-02 (×12): qty 1

## 2022-12-02 MED ORDER — CARVEDILOL 3.125 MG PO TABS
3.1250 mg | ORAL_TABLET | Freq: Two times a day (BID) | ORAL | Status: DC
  Administered 2022-12-02 – 2022-12-06 (×9): 3.125 mg via ORAL
  Filled 2022-12-02 (×9): qty 1

## 2022-12-02 NOTE — Progress Notes (Signed)
Initial Nutrition Assessment  DOCUMENTATION CODES:   Morbid obesity  INTERVENTION:  - Attach CHF diet education to discharge paperwork.   - Add Ensure Max po BID, each supplement provides 150 kcal and 30 grams of protein.    - Add MVI q day.   NUTRITION DIAGNOSIS:   Inadequate oral intake related to poor appetite as evidenced by per patient/family report.  GOAL:   Patient will meet greater than or equal to 90% of their needs  MONITOR:   PO intake, Supplement acceptance  REASON FOR ASSESSMENT:   Consult Assessment of nutrition requirement/status  ASSESSMENT:   56 y.o. female admits related to SOB and edema. PMH includes: HTN, s/p gastric bypass. Pt is currently receiving medical management for new onset CHF.  Meds reviewed: lipitor, colace, klor-con, aldactone. Labs reviewed: K low.   No answer to pt's phone in room. No intakes noted per record. Per H&P, pt reports upon admission that she had poor appetite and some emesis PTA (3 months). No significant wt changes per record. RD will attach diet education material to discharge paperwork.   NUTRITION - FOCUSED PHYSICAL EXAM:  Unable to assess, attempt at f/u.   Diet Order:   Diet Order             Diet Heart Room service appropriate? Yes; Fluid consistency: Thin; Fluid restriction: 1800 mL Fluid  Diet effective now                   EDUCATION NEEDS:   Not appropriate for education at this time  Skin:  Skin Assessment: Reviewed RN Assessment  Last BM:  12/23  Height:   Ht Readings from Last 1 Encounters:  11/30/22 '5\' 5"'$  (1.651 m)    Weight:   Wt Readings from Last 1 Encounters:  12/02/22 (!) 139.2 kg    Ideal Body Weight:     BMI:  Body mass index is 51.07 kg/m.  Estimated Nutritional Needs:   Kcal:  7591-6384 kcals  Protein:  105-125 gm  Fluid:  >/= 2 L  Thalia Bloodgood, RD, LDN, CNSC.

## 2022-12-02 NOTE — Progress Notes (Addendum)
ANTICOAGULATION CONSULT NOTE  Pharmacy Consult for heparin Indication: chest pain/ACS  Allergies  Allergen Reactions   Bisoprolol Diarrhea   Penicillin G Itching   Penicillins Itching    Patient Measurements: Height: '5\' 5"'$  (165.1 cm) Weight: (!) 139.2 kg (306 lb 14.1 oz) IBW/kg (Calculated) : 57 Heparin Dosing Weight: 100kg  Vital Signs: Temp: 98 F (36.7 C) (12/25 0342) Temp Source: Oral (12/25 0342) BP: 123/64 (12/25 0342) Pulse Rate: 99 (12/25 0342)  Labs: Recent Labs    11/30/22 1926 11/30/22 2120 11/30/22 2357 12/01/22 0550 12/01/22 0904 12/01/22 1519 12/01/22 1523 12/02/22 0103  HGB 10.3*  --   --   --   --  10.5*  --  10.9*  HCT 33.8*  --   --   --   --  34.3*  --  34.9*  PLT 360  --   --   --   --  357  --  373  HEPARINUNFRC  --   --   --  0.59  --   --  0.49 0.59  CREATININE 1.15*  --   --   --   --  1.22*  --  1.26*  TROPONINIHS  --  895* 917*  --  720*  --   --   --      Estimated Creatinine Clearance: 70.8 mL/min (A) (by C-G formula based on SCr of 1.26 mg/dL (H)).   Medical History: Past Medical History:  Diagnosis Date   Allergy    seasonal   Anemia    Arthritis    right hip   Gastritis    Headache    sinus/allergies   Hypertension    Seasonal allergies    Vertigo    1-2x/month   Wears contact lenses     Assessment: 56yo female c/o intermittent chest discomfort associated w/ dyspnea, nausea, and swelling to BLE/abdomen, troponin found to be elevated, to begin heparin.  Heparin level therapeutic at 0.59, on 1400 units/hr. CBC stable at 10.9, plts wnl. No s/sx of bleeding or infusion issues.   Goal of Therapy:  Heparin level 0.3-0.7 units/ml Monitor platelets by anticoagulation protocol: Yes   Plan:  Continue heparin infusion at 1400 units/hr  Monitor heparin levels and CBC daily  Titus Dubin, PharmD PGY1 Pharmacy Resident 12/02/2022 9:52 AM

## 2022-12-02 NOTE — Discharge Instructions (Signed)
Heart-Healthy Nutrition Therapy  A heart-healthy diet is recommended to reduce your unhealthy blood cholesterol levels, manage high blood pressure, and lower your risk for heart disease. To follow a heart-healthy diet, Eat a balanced diet with whole grains, fruits and vegetables, and lean protein sources. Achieve and maintain a healthy weight. Choose heart-healthy unsaturated fats. Limit saturated fats, trans fats, and cholesterol intake. Eat more plant-based or vegetarian meals using beans and soy foods for protein. Eat whole, unprocessed foods to limit the amount of sodium (salt) you eat. Limit refined carbohydrates especially sugar, sweets and sugar-sweetened beverages. If you drink alcohol, do so in moderation: one serving per day (women) and two servings per day (men). One serving is equivalent to 12 ounces beer, 5 ounces wine, or 1.5 ounces distilled spirits  Tips Tips for Choosing Heart-Healthy Fats Choose lean protein and low-fat dairy foods to reduce saturated fat intake. Saturated fat is usually found in animal-based protein and is associated with certain health risks. Saturated fat is the biggest contributor to raised low-density lipoprotein (LDL) cholesterol levels in the diet. Research shows that limiting saturated fat lowers unhealthy cholesterol levels. Eat no more than 5-6% of your total calories each day from saturated fat. Ask your registered dietitian nutritionist (RDN) to help you determine how much saturated fat is right for you. There are many foods that do not contain large amounts of saturated fats. Swapping these foods to replace foods high in saturated fats will help you limit the saturated fat you eat and improve your cholesterol levels. You can also try eating more plant-based or vegetarian meals. Instead of. Try:  Whole milk, cheese, yogurt, and ice cream 1%, %, or skim milk, low-fat cheese, non-fat yogurt, and low-fat ice cream  Fatty, marbled beef and pork Lean  beef, pork, or venison  Poultry with skin Poultry without skin  Butter, stick margarine Reduced-fat, whipped, or liquid spreads  Coconut oil, palm oil Liquid vegetable oils: corn, canola, olive, soybean and safflower oils   Avoid trans fats. Trans fats increase levels of LDL-cholesterol. Hydrogenated fat in processed foods is the main source of trans fats in foods.  Trans fats can be found in stick margarine, shortening, processed sweets, baked goods, some fried foods, and packaged foods made with hydrogenated oils. Avoid foods with "partially hydrogenated oil" on the ingredient list such as: cookies, pastries, baked goods, biscuits, crackers, microwave popcorn, and frozen dinners. Choose foods with heart healthy fats. Polyunsaturated and monounsaturated fat are unsaturated fats that may help lower your blood cholesterol level when used in place of saturated fat in your diet. Ask your RDN about taking a dietary supplement with plant sterols and stanols to help lower your cholesterol level. Research shows that substituting saturated fats with unsaturated fats is beneficial to cholesterol levels. Try these easy swaps:   Instead of. Try:  Butter, stick margarine, or solid shortening Reduced-fat, whipped, or liquid spreads  Beef, pork, or poultry with skin   Fish and seafood  Chips, crackers, snack foods Raw or unsalted nuts and seeds or nut butters Hummus with vegetables Avocado on toast  Coconut oil, palm oil Liquid vegetable oils: corn, canola, olive, soybean and safflower oils    Limit the amount of cholesterol you eat to less than 200 milligrams per day. Cholesterol is a substance carried through the bloodstream via lipoproteins, which are known as "transporters" of fat. Some body functions need cholesterol to work properly, but too much cholesterol in the bloodstream can damage arteries and build up blood   vessel linings (which can lead to heart attack and stroke). You should eat less than  200 milligrams cholesterol per day. People respond differently to eating cholesterol. There is no test available right now that can figure out which people will respond more to dietary cholesterol and which will respond less. For individuals with high intake of dietary cholesterol, different types of increase (none, small, moderate, large) in LDL-cholesterol levels are all possible.   Food sources of cholesterol include egg yolks and organ meats such as liver, gizzards.  Limit egg yolks to two to four per week and avoid organ meats like liver and gizzards to control cholesterol intake.  Tips for Choosing Heart-Healthy Carbohydrates Consume foods rich in viscous (soluble) fiber Viscous, or soluble, fiber is found in the walls of plant cells. Viscous fiber is found only in plant-based foods--animal-based foods like meat or dairy products do not contain fiber. In the stomach, viscous fibers absorb water and swell to form a thick, jelly-like mass. This helps to lower your unhealthy cholesterol Rich sources of viscous fiber include asparagus, Brussels sprouts, sweet potatoes, turnips, apricots, mangoes, oranges, legumes, barley, oats, and oat bran. Eat at least 5 to 10 grams of viscous fiber each day. As you increase your fiber intake gradually, also increase the amount of water you drink. This will help prevent constipation. If you have difficulty achieving this goal, ask your RDN about fiber laxatives. Choose fiber supplements made with viscous fibers such as psyllium seed husks or methylcellulose to help lower unhealthy cholesterol. Limit refined carbohydrates There are three types of carbohydrates: starches, sugar, and fiber. Some carbohydrates occur naturally in food, like the starches in rice or corn or the sugars in fruits and milk. Refined carbohydrates--foods with high amounts of simple sugars--can raise triglyceride levels. High triglyceride levels are associated with coronary heart disease. Some  examples of refined carbohydrate foods are table sugar, sweets, and beverages sweetened with added sugar. Tips for Reducing Sodium (Salt) Although sodium is important for your body to function, too much sodium can be harmful for people with high blood pressure.  As sodium and fluid buildup in your tissues and bloodstream, your blood pressure increases. High blood pressure may cause damage to other organs and increase your risk for a stroke. Keep your salt intake to 2300 milligrams or less per day. Even if you take a pill for blood pressure or a water pill (diuretic) to remove fluid, it is still important to have less salt in your diet. Ask your RDN what amount of sodium is right for you. Avoid processed foods.  Eat more fresh foods. Fresh fruits and vegetables are naturally low in sodium, as well as frozen vegetables and fruits that have no added juices or sauces. Fresh meats are lower in sodium than processed meats, such as bacon, sausage, and hotdogs.  Read the nutrition label or ask your butcher to help you find a fresh meat that is low in sodium. Eat less salt--at the table and when cooking. A single teaspoon of table salt has 2,300 mg of sodium. Leave the salt out of recipes for pasta, casseroles, and soups. Ask your RDN how to cook your favorite recipes without sodium Be a smart shopper. Look for food packages that say "salt-free" or "sodium-free." These items contain less than 5 milligrams of sodium per serving. "Very low-sodium" products contain less than 35 milligrams of sodium per serving. "Low-sodium" products contain less than 140 milligrams of sodium per serving.  Beware of "reduced salt" or "reduced   sodium" products.  These items may still be high in sodium. Check the nutrition label.  Add flavors to your food without adding sodium. Try lemon juice, lime juice, fruit juice or vinegar.   Dry or fresh herbs add flavor. Try basil, bay leaf, dill, rosemary, parsley, sage, dry mustard,  nutmeg, thyme, and paprika. Pepper, red pepper flakes, and cayenne pepper can add spice to your meals without adding sodium. Hot sauce contains sodium, but if you use just a drop or two, it will not add up to much. Buy a sodium-free seasoning blend or make your own at home.    Additional Lifestyle Tips Achieve and maintain a healthy weight. Talk with your RDN or your doctor about what is a healthy weight for you. Set goals to reach and maintain that weight.  To lose weight, reduce your calorie intake along with increasing your physical activity. A weight loss of 10 to 15 pounds could reduce LDL-cholesterol by 5 milligrams per deciliter. Participate in physical activity. Talk with your health care team to find out what types of physical activity are best for you. Set a plan to get about 30 minutes of exercise on most days.   Foods to Choose or to Limit Food Group Foods to Choose Foods to Limit  Grains Whole grain breads and cereals, including whole wheat, barley, rye, buckwheat, corn, teff, quinoa, millet, amaranth, brown or wild rice, sorghum, and oats Pasta, especially whole wheat or other whole grain types Brown rice, quinoa or wild rice Whole grain crackers, bread, rolls, pitas Home-made bread with reduced-sodium baking soda Breads or crackers topped with salt Cereals (hot or cold) with more than 300 mg sodium per serving Biscuits, cornbread, and other "quick" breads prepared with baking soda Bread crumbs or stuffing mix from a store High-fat bakery products, such as doughnuts, biscuits, croissants, danish pastries, pies, cookies Instant cooking foods to which you add hot water and stir--potatoes, noodles, rice, etc. Packaged starchy foods--seasoned noodle or rice dishes, stuffing mix, macaroni and cheese dinner Snacks made with partially hydrogenated oils, including chips, cheese puffs, snack mixes, regular crackers, butter-flavored popcorn  Protein Foods Lean cuts of beef and pork  (loin, leg, round, extra lean hamburger) Skinless poultry Fish Venison and other wild game Dried beans and peas Nuts and nut butters (unsalted) Seeds and seed butters (unsalted) Meat alternatives made with soy or textured vegetable protein Egg whites or egg substitute Cold cuts made with lean meat or soy protein Higher-fat cuts of meats (ribs, t-bone steak, regular hamburger) Bacon, sausage, or hot dogs Cold cuts, such as salami or bologna, deli meats, cured meats, corned beef Organ meats (liver, brains, gizzards, sweetbreads) Poultry with skin Fried or smoked meat, poultry, and fish Whole eggs and egg yolks (more than 2-4 per week) Salted legumes, nuts, seeds, or nut/seed butters Meat alternatives with high levels of sodium (>300 mg per serving) or saturated fat (>5 g per serving)  Dairy and Dairy Alternatives Nonfat (skim), low-fat, or 1%-fat milk Nonfat or low-fat yogurt or cottage cheese Fat-free and low-fat cheese Fortified non-dairy milk: almond, cashew, pea, and soy Whole milk, 2% fat milk, buttermilk Whole milk yogurt or ice cream Cream, half-&-half Cream cheese Sour cream Cheese  Vegetables Fresh, frozen, or canned vegetables without added fat or salt Avocados Canned or frozen vegetables with salt, fresh vegetables prepared with salt, butter, cheese, or cream sauce Fried vegetables Pickled vegetables such as olives, pickles, or sauerkraut  Fruits Fresh, frozen, canned, or dried fruit Fried fruits Fruits   served with butter or cream  Oils Unsaturated oils (corn, olive, peanut, soy, sunflower, canola) Soft or liquid margarines and vegetable oil spreads Salad dressings made from saturated fats Butter, stick margarine, shortening Partially hydrogenated oils or trans fats Tropical oils (coconut, palm, palm kernel oils)  Other Prepared or homemade foods, including soups, casseroles, and salads made from recommended ingredients and contain <600 mg sodium. Low-sodium seasonings  (ketchup, barbeque sauce) Spices, herbs, Salt-free seasoning mixes and marinades Vinegar Lemon or lime juice Prepared foods, including soups, casseroles, and salads made from recommended ingredients and contain >600 mg sodium. Frozen meals and prepared sides that are >600 mg of sodium Sugary and/or fatty desserts, candy, and other sweets Salts:  sea salt, kosher salt, onion salt, and garlic salt, seasoning mixes containing salt Flavorings: bouillon cubes, catsup or ketchup, barbeque sauce, Worcestershire sauce, soy sauce, salsa, relish, teriyaki sauce   Heart-Healthy Sample 1-Day Menu View Nutrient Info Breakfast 1 cup oatmeal 1 cup fat-free milk 1 cup blueberries 1 ounce almonds, unsalted 1 cup brewed coffee  Lunch 2 slices whole-wheat bread 2 ounces lean turkey breast 1 ounce low-fat Swiss cheese 2 slices tomato 2 lettuce leaves 1 pear 1 cup skim milk  Afternoon Snack 1 ounce trail mix with unsalted nuts, seeds, and raisins  Evening Meal 3 ounces broiled salmon 2/3 cup brown rice 1 teaspoon margarine, soft tub  cup broccoli, cooked  cup carrots, cooked 1 cup tossed salad 1 teaspoon olive oil and vinegar dressing 1 small whole-wheat roll 1 teaspoon margarine, soft tub 1 cup tea  Evening Snack 1 small banana  Daily Sum Nutrient Unit Value  Macronutrients  Energy kcal 2069  Energy kJ 8655  Protein g 146  Total lipid (fat) g 69  Carbohydrate, by difference g 228  Fiber, total dietary g 33  Sugars, total g 85  Minerals  Calcium, Ca mg 1292  Iron, Fe mg 14  Sodium, Na mg 1751  Vitamins  Vitamin C, total ascorbic acid mg 85  Vitamin A, IU IU 17756  Vitamin D IU 231  Lipids  Fatty acids, total saturated g 12  Fatty acids, total monounsaturated g 27  Fatty acids, total polyunsaturated g 24  Cholesterol mg 270     Heart-Healthy Vegan 1-Day Sample Menu View Nutrient Info Breakfast 1 slice whole wheat toast 2 tablespoons peanut butter without salt Tofu scramble  made with:  cup calcium-set tofu  cup green pepper  cup spinach  cup tomatoes  cup white mushrooms 1 teaspoon canola oil 1 cup soymilk fortified with calcium, vitamin B12, and vitamin D  Lunch 1 cup reduced sodium split pea soup 1 whole wheat dinner roll 1 medium apple  Dinner Salad made with: 1 cup lentils  cup cooked broccoli  cup cooked carrots 2 tablespoons hummus 1 cup sliced strawberries 1 cup soymilk fortified with calcium, vitamin B12, and vitamin D  Evening Snack 1 cup soy yogurt  cup mixed nuts  Daily Sum Nutrient Unit Value  Macronutrients  Energy kcal 1672  Energy kJ 7000  Protein g 86  Total lipid (fat) g 65  Carbohydrate, by difference g 205  Fiber, total dietary g 47  Sugars, total g 73  Minerals  Calcium, Ca mg 1443  Iron, Fe mg 19  Sodium, Na mg 1148  Vitamins  Vitamin C, total ascorbic acid mg 253  Vitamin A, IU IU 15451  Vitamin D IU 361  Lipids  Fatty acids, total saturated g 11  Fatty acids, total monounsaturated g   29  Fatty acids, total polyunsaturated g 19  Cholesterol mg 5     Heart-Healthy Vegetarian (Lacto-Ovo) Sample 1-Day Menu View Nutrient Info Breakfast 1 cup cooked oatmeal 1 tablespoon ground flaxseed 1 cup blueberries 2 scrambled egg whites with 1 teaspoon canola oil 2 tablespoons salsa 1 cup fat-free milk 1 cup coffee Oil, canola  Lunch 2 slices whole wheat bread 2 tablespoons peanut butter without salt 1 small banana 6 ounces fat-free plain yogurt 1 cup sliced red pepper 2 tablespoons hummus 1 cup fat-free milk  Evening Meal Stir fry made with:  cup tofu 1 cup brown rice  cup cooked broccoli  cup cooked carrots  cup cooked green beans 1 teaspoon peanut oil  Evening Snack 1 slice low-fat mozzarella cheese 1 medium apple  Daily Sum Nutrient Unit Value  Macronutrients  Energy kcal 1764  Energy kJ 7375  Protein g 87  Total lipid (fat) g 53  Carbohydrate, by difference g 254  Fiber, total dietary g 35   Sugars, total g 98  Minerals  Calcium, Ca mg 1609  Iron, Fe mg 12  Sodium, Na mg 1434  Vitamins  Vitamin C, total ascorbic acid mg 210  Vitamin A, IU IU 16317  Vitamin D IU 234  Lipids  Fatty acids, total saturated g 12  Fatty acids, total monounsaturated g 21  Fatty acids, total polyunsaturated g 15  Cholesterol mg 31    Copyright 2020  [employer]. All rights reserved   Additional Discharge Instructions   Please get your medications reviewed and adjusted by your Primary MD.  Please request your Primary MD to go over all Hospital Tests and Procedure/Radiological results at the follow up, please get all Hospital records sent to your Prim MD by signing hospital release before you go home.  If you had Pneumonia of Lung problems at the Hospital: Please get a 2 view Chest X ray done in approximately 4 weeks after hospital discharge or sooner if instructed by your Primary MD.  If you have Congestive Heart Failure: Please call your Cardiologist or Primary MD anytime you have any of the following symptoms:  1) 3 pound weight gain in 24 hours or 5 pounds in 1 week  2) shortness of breath, with or without a dry hacking cough  3) swelling in the hands, feet or stomach  4) if you have to sleep on extra pillows at night in order to breathe  Follow cardiac low salt diet and 1.5 lit/day fluid restriction.  If you have diabetes Accuchecks 4 times/day, Once in AM empty stomach and then before each meal. Log in all results and show them to your primary doctor at your next visit. If any glucose reading is under 80 or above 300 call your primary MD immediately.  If you have Seizure/Convulsions/Epilepsy: Please do not drive, operate heavy machinery, participate in activities at heights or participate in high speed sports until you have seen by Primary MD or a Neurologist and advised to do so again. Per [place] DMV statutes, patients with seizures are not  allowed to drive until they have been seizure-free for six months.  Use caution when using heavy equipment or power tools. Avoid working on ladders or at heights. Take showers instead of baths. Ensure the water temperature is not too high on the home water heater. Do not go swimming alone. Do not lock yourself in a room alone (i.e. bathroom). When caring for infants or small children, sit down when   holding, feeding, or changing them to minimize risk of injury to the child in the event you have a seizure. Maintain good sleep hygiene. Avoid alcohol.   If you had Gastrointestinal Bleeding: Please ask your Primary MD to check a complete blood count within one week of discharge or at your next visit. Your endoscopic/colonoscopic biopsies that are pending at the time of discharge, will also need to followed by your Primary MD.  Get Medicines reviewed and adjusted. Please take all your medications with you for your next visit with your Primary MD  Please request your Primary MD to go over all hospital tests and procedure/radiological results at the follow up, please ask your Primary MD to get all Hospital records sent to his/her office.  If you experience worsening of your admission symptoms, develop shortness of breath, life threatening emergency, suicidal or homicidal thoughts you must seek medical attention immediately by calling 911 or calling your MD immediately  if symptoms less severe.  You must read complete instructions/literature along with all the possible adverse reactions/side effects for all the Medicines you take and that have been prescribed to you. Take any new Medicines after you have completely understood and accpet all the possible adverse reactions/side effects.   Do not drive or operate heavy machinery when taking Pain medications.   Do not take more than prescribed Pain, Sleep and Anxiety Medications  Special Instructions: If you have smoked or chewed Tobacco  in the last 2 yrs  please stop smoking, stop any regular Alcohol  and or any Recreational drug use.  Wear Seat belts while driving.  Please note You were cared for by a hospitalist during your hospital stay. If you have any questions about your discharge medications or the care you received while you were in the hospital after you are discharged, you can call the unit and asked to speak with the hospitalist on call if the hospitalist that took care of you is not available. Once you are discharged, your primary care physician will handle any further medical issues. Please note that NO REFILLS for any discharge medications will be authorized once you are discharged, as it is imperative that you return to your primary care physician (or establish a relationship with a primary care physician if you do not have one) for your aftercare needs so that they can reassess your need for medications and monitor your lab values.  You can reach the hospitalist office at phone [phone] or fax [phone]   If you do not have a primary care physician, you can call [phone] for a physician referral.  

## 2022-12-02 NOTE — Progress Notes (Signed)
Cardiology Progress Note  Patient ID: Antania Hoefling MRN: [id number] DOB: 1966-08-20 Date of Encounter: 12/02/2022  Primary Cardiologist: None  Subjective   Chief Complaint: SOB/Orthopnea   HPI: Reports orthopnea.  Still volume overloaded.  Potassium still too low for aggressive IV diuresis.  Denies chest pain.  Telemetry with sinus rhythm and frequent PVCs.  ROS:  All other ROS reviewed and negative. Pertinent positives noted in the HPI.     Inpatient Medications  Scheduled Meds:  aspirin  81 mg Oral Daily   atorvastatin  40 mg Oral Daily   carvedilol  3.125 mg Oral BID WC   docusate sodium  100 mg Oral BID   pantoprazole  40 mg Oral Daily   potassium chloride  40 mEq Oral Q4H   sodium chloride flush  3 mL Intravenous Q12H   spironolactone  25 mg Oral Daily   Continuous Infusions:  heparin 1,400 Units/hr (12/01/22 1851)   magnesium sulfate bolus IVPB     PRN Meds: acetaminophen **OR** acetaminophen, albuterol, bisacodyl, guaiFENesin-dextromethorphan, hydrALAZINE, ondansetron **OR** ondansetron (ZOFRAN) IV, oxyCODONE, polyethylene glycol, traZODone   Vital Signs   Vitals:   12/01/22 2100 12/02/22 0034 12/02/22 0342 12/02/22 0500  BP: 126/84 (!) 136/91 123/64   Pulse: 92 100 99   Resp: _0 Temp: 98.4 F (36.9 C) 97.7 F (36.5 C) 98 F (36.7 C)   TempSrc: Oral Oral Oral   SpO2: 99% 100% 100%   Weight:    (!) 139.2 kg  Height:        Intake/Output Summary (Last 24 hours) at 12/02/2022 0741 Last data filed at 12/01/2022 1851 Gross per 24 hour  Intake 431.84 ml  Output --  Net 431.84 ml      12/02/2022    5:00 AM 11/30/2022    7:13 PM 11/15/2022   11:15 AM  Last 3 Weights  Weight (lbs) 306 lb 14.1 oz 308 lb 306 lb 9.6 oz  Weight (kg) 139.2 kg 139.708 kg 139.073 kg      Telemetry  Overnight telemetry shows sinus rhythm 90s, PVCs noted, which I personally reviewed.   Physical Exam   Vitals:   12/01/22 2100 12/02/22 0034 12/02/22 0342  12/02/22 0500  BP: 126/84 (!) 136/91 123/64   Pulse: 92 100 99   Resp: _1 Temp: 98.4 F (36.9 C) 97.7 F (36.5 C) 98 F (36.7 C)   TempSrc: Oral Oral Oral   SpO2: 99% 100% 100%   Weight:    (!) 139.2 kg  Height:        Intake/Output Summary (Last 24 hours) at 12/02/2022 0741 Last data filed at 12/01/2022 1851 Gross per 24 hour  Intake 431.84 ml  Output --  Net 431.84 ml       12/02/2022    5:00 AM 11/30/2022    7:13 PM 11/15/2022   11:15 AM  Last 3 Weights  Weight (lbs) 306 lb 14.1 oz 308 lb 306 lb 9.6 oz  Weight (kg) 139.2 kg 139.708 kg 139.073 kg    Body mass index is 51.07 kg/m.  General: Well nourished, well developed, in no acute distress Head: Atraumatic, normal size  Eyes: PEERLA, EOMI  Neck: Supple, JVD 12 to 15 cm of water Endocrine: No thryomegaly Cardiac: Normal S1, S2; RRR; no murmurs, rubs, or gallops Lungs: Crackles at the lung bases Abd: Soft, nontender, no hepatomegaly  Ext: 2+ pitting edema up to the knees Musculoskeletal: No deformities, BUE and BLE strength  normal and equal Skin: Warm and dry, no rashes   Neuro: Alert and oriented to person, place, time, and situation, CNII-XII grossly intact, no focal deficits  Psych: Normal mood and affect   Labs  High Sensitivity Troponin:   Recent Labs  Lab 11/30/22 2120 11/30/22 2357 12/01/22 0904  TROPONINIHS 895* 917* 720*     Cardiac EnzymesNo results for input(s): "TROPONINI" in the last 168 hours. No results for input(s): "TROPIPOC" in the last 168 hours.  Chemistry Recent Labs  Lab 11/30/22 1926 12/01/22 1519 12/02/22 0103  NA 138 141 139  K 2.6* 2.7* 3.0*  CL 100 102 101  CO2 _0 GLUCOSE 117* 122* 124*  BUN _1 CREATININE 1.15* 1.22* 1.26*  CALCIUM 8.8* 9.1 9.3  PROT 6.9  --   --   ALBUMIN 3.3*  --   --   AST 38  --   --   ALT 48*  --   --   ALKPHOS 87  --   --   BILITOT 1.5*  --   --   GFRNONAA 56* 52* 50*  ANIONGAP _2 Hematology Recent Labs  Lab  11/30/22 1926 12/01/22 1519 12/02/22 0103  WBC 11.1* 10.8* 11.2*  RBC 4.76 4.81 4.92  HGB 10.3* 10.5* 10.9*  HCT 33.8* 34.3* 34.9*  MCV 71.0* 71.3* 70.9*  MCH 21.6* 21.8* 22.2*  MCHC 30.5 30.6 31.2  RDW 17.7* 17.9* 17.8*  PLT 360 357 373   BNP Recent Labs  Lab 11/30/22 2119  BNP 777.6*    DDimer No results for input(s): "DDIMER" in the last 168 hours.   Radiology  CT ABDOMEN PELVIS W CONTRAST  Result Date: 11/30/2022 CLINICAL DATA:  Abdominal pain acute. Previous gastric bypass surgery. Nausea vomiting and abdominal distention. CT abdomen reading right this lady at black man open the exam sweats going edema at its edema amnion head bowel is fine and bowel fine SOB and abdominal fat there is that band is no edema in the belly fat is normal both lower lobes pulmonary edema in the bilateral lower lobes correct also at I am looking at again [facility] carefully is seeing about the anastomosis urinary bladder edema to dad that early EXAM: CT ABDOMEN AND PELVIS WITH CONTRAST TECHNIQUE: Multidetector CT imaging of the abdomen and pelvis was performed using the standard protocol following bolus administration of intravenous contrast. RADIATION DOSE REDUCTION: This exam was performed according to the departmental dose-optimization program which includes automated exposure control, adjustment of the mA and/or kV according to patient size and/or use of iterative reconstruction technique. CONTRAST:  161m OMNIPAQUE IOHEXOL 300 MG/ML  SOLN COMPARISON:  None Available. FINDINGS: Lower chest: Pulmonary vascular congestion and ground-glass opacities in bilateral lower lobes suggesting pulmonary edema. Small right pleural effusion. Hepatobiliary: No focal liver abnormality is seen. No gallstones or biliary ductal dilatation. Mild edema about the gallbladder. Pancreas: Generalized pancreatic atrophy. No pancreatic ductal dilatation. Spleen: Normal in size without focal abnormality. Adrenals/Urinary Tract:  Adrenal glands are unremarkable. Kidneys are normal, without renal calculi, focal lesion, or hydronephrosis. Bladder is unremarkable. Stomach/Bowel: Stomach is within normal limits. Postsurgical changes for prior gastric bypass surgery. No abdominal fluid collection or extraluminal free air. Bowel loops are normal in caliber. Appendix appears normal. No evidence of bowel wall thickening, distention, or inflammatory changes. Vascular/Lymphatic: No significant vascular findings are present. No enlarged abdominal or pelvic lymph nodes. Reproductive: Uterus and bilateral adnexa are unremarkable. Other: Marked generalized anasarca  and skin thickening. Trace amount of fluid in the dependent pelvis. 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 No acute osseous abnormality. IMPRESSION: 1. Pulmonary vascular congestion and ground-glass opacities in bilateral lower lobes suggesting pulmonary edema. Small right pleural effusion. 2. Marked generalized anasarca and skin thickening. This is likely secondary to heart failure. Clinical correlation is suggested. 3. Postsurgical changes for prior gastric bypass surgery. No evidence of bowel obstruction. No abdominal fluid collection or extraluminal free air. 4. 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 No acute osseous abnormality. Electronically Signed   By: Keane Police D.O.   On: 11/30/2022 22:21   DG Chest 2 View  Result Date: 11/30/2022 CLINICAL DATA:  Shortness of breath EXAM: CHEST - 2 VIEW COMPARISON:  Chest x-ray dated October 10, 2022 FINDINGS: Unchanged cardiomegaly. Mild linear opacity of the left upper lobe, similar to prior and likely due to scarring or atelectasis. Lungs are otherwise clear. No overt pulmonary edema. No pleural effusion or pneumothorax. IMPRESSION: No active cardiopulmonary disease. Electronically Signed   By: Yetta Glassman M.D.   On: 11/30/2022 19:50    Cardiac Studies  Echo  pending  Patient Profile  56 year old female with history of hypertension, obesity status post gastric bypass surgery who was admitted on 12/01/2022 for new onset (likely) systolic heart failure.   Assessment & Plan   # Acute systolic heart failure, echo pending -Remains grossly volume overloaded.  Potassium still too low for aggressive IV diuresis.  We will give her another 40 mEq every 4 hours for 3 doses.   -I have added a magnesium level to today's labs. -Continue Aldactone. -Plan to recheck labs this afternoon around 2 PM.  Hopefully we can then start aggressive IV diuretics this afternoon. -Echo pending today. -A1c in process.  CRP minimally elevated.  ESR negative.  HIV negative.  TSH unremarkable. -She will ultimately need left heart catheterization but we need to diurese her effectively.  Cannot do this currently given very low potassium level and frequent ectopy. -I think it is okay to start a beta-blocker today.  Add Coreg 3.125 mg twice daily.  # Elevated troponin, demand ischemia versus non-STEMI -Echo pending.  No chest pain.  Here with likely acute systolic heart failure. -Continue aspirin and statin.  She is on heparin drip.  Will ultimately need left and right heart catheterization but needs to be more euvolemic before we do this. -Continue current therapy for now.  # Hypokalemia # Hypomagnesemia # Frequent PVCs/nonsustained ventricular tachycardia -Potassium level still too low.  Continue with 40 mEq of potassium every 4 hours for 3 doses.  We will give another 2 g of magnesium.  Recheck labs this afternoon.  Aldactone should also be able to help.  We cannot aggressively diurese her in the setting of dangerously low potassium.  This can create more ventricular tachycardia. -Repeat EKG today to get a better idea for QTc.    For questions or updates, please contact [place] Please consult [email or web address] for contact info under   Signed, [place] T. Audie Box, MD,  [place]  12/02/2022 7:41 AM

## 2022-12-02 NOTE — Progress Notes (Addendum)
Stephanie Gates MLY:[id number] DOB: Apr 12, 1966 DOA: 11/30/2022 PCP: Jonetta Osgood, NP   Subj:  56 y.o. BF PMHx: HTN and morbid obesity s/p gastric bypass   Presenting with SOB, edema.   She reports LE edema, extending up to her abdomen.  She was having difficulty breathing with exertion and with orthopnea.  +emesis, cough for about 3 months.  She had bronchitis -> PNA.  She had been to her PCP and had BP medications changed twice.  PNA was a little over a month ago.  She hasn't been able to eat much, feels like it gets stuck in her throat with early satiety.  Once the fluid came off in the ER, those symptoms have improved.  Cough has been productive of clear, frothy sputum.  No history of CHF.  When her coughing was its worst, she would have chest pain with coughing; that is not happening often now.    Obj: 12/25 A/O x 4, positive nausea, positive SOB (perceived), negative CP   Objective: VITAL SIGNS: Temp: 98 F (36.7 C) (12/25 0342) Temp Source: Oral (12/25 0342) BP: 123/64 (12/25 0342) Pulse Rate: 99 (12/25 0342)   VENTILATOR SETTINGS: **   Intake/Output Summary (Last 24 hours) at 12/02/2022 0749 Last data filed at 12/01/2022 1851 Gross per 24 hour  Intake 431.84 ml  Output --  Net 431.84 ml     Exam: Physical Exam:  General: A/O x 4, No acute respiratory distress Eyes: negative scleral hemorrhage, negative anisocoria, negative icterus ENT: Negative Runny nose, negative gingival bleeding, Neck:  Negative scars, masses, torticollis, lymphadenopathy, JVD Lungs: Clear to auscultation bilaterally without wheezes or crackles (difficult to auscultate secondary to patient's body habitus) Cardiovascular: Regular rate and rhythm without murmur gallop or rub normal S1 and S2 Abdomen: MORBIDLY OBESE, negative abdominal pain, nondistended, positive soft, bowel sounds, no rebound, no ascites, no appreciable mass Extremities: anasarca  Skin: Negative rashes, lesions,  ulcers Psychiatric:  Negative depression, negative anxiety, negative fatigue, negative mania  Central nervous system:  Cranial nerves II through XII intact, tongue/uvula midline, all extremities muscle strength 5/5, sensation intact throughout, , negative dysarthria, negative expressive aphasia, negative receptive aphasia.   .     Mobility Assessment (last 72 hours)     Mobility Assessment     Row Name 12/01/22 1633 12/01/22 1455 12/01/22 06:00:36       Does patient have an order for bedrest or is patient medically unstable No - Continue assessment No - Continue assessment Yes- Bedfast (Level 1) - Complete     What is the highest level of mobility based on the progressive mobility assessment? Level 5 (Walks with assist in room/hall) - Balance while stepping forward/back and can walk in room with assist - Complete Level 5 (Walks with assist in room/hall) - Balance while stepping forward/back and can walk in room with assist - Complete --                DVT prophylaxis:  Code Status:  Family Communication:  Status is: Inpatient    Dispo: The patient is from: Home              Anticipated d/c is to: Home vs SNF              Anticipated d/c date is:               Patient currently is not medically stable to d/c.    Procedures/Significant Events:    Consultants:  Cardiology  Cultures   Antimicrobials:    Assessment & Plan: Covid vaccination;   Principal Problem:   New onset of congestive heart failure ([place]) Active Problems:   Morbid obesity with BMI of 50.0-59.9, adult ([place])   Essential (primary) hypertension   Diabetes mellitus type 2 in obese ([facility])   Hypokalemia   Dyslipidemia   Acute systolic heart failure, echo pending -Strict in and out - Daily weight -Remains grossly volume overloaded.  Potassium still too low for aggressive IV diuresis.  We will give her another 40 mEq every 4 hours for 3 doses.   -I have added a magnesium level to today's  labs. -Plan to recheck labs this afternoon around 2 PM.  Hopefully we can then start aggressive IV diuretics this afternoon. -Echo pending today. -A1c in process.  CRP minimally elevated.  ESR negative.  HIV negative.  TSH unremarkable. -She will ultimately need left heart catheterization but we need to diurese her effectively.  Cannot do this currently given very low potassium level and frequent ectopy. -12/25 Coreg 3.125 mg twice daily. -12/25 hydralazine PRN -12/25 spironolactone 25 mg daily   Elevated troponin, demand ischemia versus non-STEMI -Echo pending.   -Negative CP, positive nausea and perception of SOB  -Heparin drip  -Hypokalemia - Potassium goal> 4 - 12/25 potassium 20 mEq -12/20 5 repeat K still low.  Potassium IV 30 mEq  Hypomagnesmia - Magnesium goal> 2 - 12/25 Magnesium IV 2 g   Frequent PVCs/nonsustained ventricular tachycardia -Most likely secondary to potassium/magnesium abnormality - Aggressively replete potassium see above -Repeat EKG today to get a better idea for QTc.    Morbidly obese (BMI 51.07 kg/m). -Address as outpatient with PCP          Mobility Assessment (last 72 hours)     Mobility Assessment     Row Name 12/01/22 1633 12/01/22 1455 12/01/22 06:00:36       Does patient have an order for bedrest or is patient medically unstable No - Continue assessment No - Continue assessment Yes- Bedfast (Level 1) - Complete     What is the highest level of mobility based on the progressive mobility assessment? Level 5 (Walks with assist in room/hall) - Balance while stepping forward/back and can walk in room with assist - Complete Level 5 (Walks with assist in room/hall) - Balance while stepping forward/back and can walk in room with assist - Complete --               Time: 50 minutes         Care during the described time interval was provided by me .  I have reviewed this patient's available data, including medical history, events  of note, physical examination, and all test results as part of my evaluation.

## 2022-12-02 NOTE — Evaluation (Signed)
Physical Therapy Evaluation Patient Details Name: Stephanie Gates MRN: [id number] DOB: [birth year] Today's Date: 12/02/2022  History of Present Illness  Pt is a 56 y.o. female admitted 11/30/22 with SOB, BLE edema, emesis. Workup for new onset CHF. PMH includes HTN, HLD, DM, obesity.   Clinical Impression  Pt presents with an overall decrease in functional mobility secondary to above. PTA, pt independent without DME, works from home and also cares for her mother; pt lives with supportive daughter. Today, pt moving well with and without DME, though distance limited by c/o fatigue and SOB. Initiated educ re: activity recommendations and energy conservation strategies with HF. Pt would benefit from continued acute PT services to maximize functional mobility and independence prior to d/c home.      Recommendations for follow up therapy are one component of a multi-disciplinary discharge planning process, led by the attending physician.  Recommendations may be updated based on patient status, additional functional criteria and insurance authorization.  Follow Up Recommendations No PT follow up      Assistance Recommended at Discharge PRN  Patient can return home with the following  Assistance with cooking/housework;Assist for transportation    Equipment Recommendations None recommended by PT  Recommendations for Other Services  Mobility Specialist     Functional Status Assessment Patient has had a recent decline in their functional status and demonstrates the ability to make significant improvements in function in a reasonable and predictable amount of time.     Precautions / Restrictions Precautions Precautions: Other (comment) Precaution Comments: watch HR Restrictions Weight Bearing Restrictions: No      Mobility  Bed Mobility Overal bed mobility: Modified Independent                  Transfers Overall transfer level: Modified independent Equipment used: Rolling  walker (2 wheels)               General transfer comment: reliant on momentum to power up; able to stand from EOB to RW; mod indep to stand from toilet without DME, use of grab bar    Ambulation/Gait Ambulation/Gait assistance: Supervision Gait Distance (Feet): 36 Feet Assistive device: Rolling walker (2 wheels), None Gait Pattern/deviations: Step-through pattern, Decreased stride length, Wide base of support Gait velocity: Decreased     General Gait Details: pt ambulating into bathroom with RW, able to walk out without DME; distance limited by fatigue and SOB  Stairs            Wheelchair Mobility    Modified Rankin (Stroke Patients Only)       Balance Overall balance assessment: Needs assistance Sitting-balance support: No upper extremity supported, Feet supported Sitting balance-Leahy Scale: Good Sitting balance - Comments: indep with pericare   Standing balance support: No upper extremity supported, During functional activity Standing balance-Leahy Scale: Fair                               Pertinent Vitals/Pain Pain Assessment Pain Assessment: Faces Faces Pain Scale: No hurt Pain Intervention(s): Monitored during session    Home Living Family/patient expects to be discharged to:: Private residence Living Arrangements: Children Available Help at Discharge: Family;Available 24 hours/day Type of Home: House Home Access: Level entry       Home Layout: One level Home Equipment: None Additional Comments: lives with daughter who also works from home and can be available to assist    Prior Function Prior Level of Function :  Independent/Modified Independent;Working/employed;Driving             Mobility Comments: independent without DME, works from home (with [employer]), primarily sedentary. 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 ADLs Comments: independent     Hand Dominance        Extremity/Trunk Assessment   Upper Extremity  Assessment Upper Extremity Assessment: Overall WFL for tasks assessed    Lower Extremity Assessment Lower Extremity Assessment: Overall WFL for tasks assessed       Communication   Communication: No difficulties  Cognition Arousal/Alertness: Awake/alert Behavior During Therapy: WFL for tasks assessed/performed Overall Cognitive Status: Within Functional Limits for tasks assessed                                          General Comments General comments (skin integrity, edema, etc.): HR 90s, brief episodes up to 150 (RN aware, unsure if accurate reading). initiated educ on activity recommendations and energy conservation strategies with HF    Exercises     Assessment/Plan    PT Assessment Patient needs continued PT services  PT Problem List Decreased activity tolerance;Decreased mobility;Cardiopulmonary status limiting activity       PT Treatment Interventions DME instruction;Gait training;Functional mobility training;Therapeutic activities;Therapeutic exercise;Patient/family education    PT Goals (Current goals can be found in the Care Plan section)  Acute Rehab PT Goals Patient Stated Goal: return home, less fatigue PT Goal Formulation: With patient Time For Goal Achievement: 12/16/22 Potential to Achieve Goals: Good    Frequency Min 2X/week     Co-evaluation               AM-PAC PT "6 Clicks" Mobility  Outcome Measure Help needed turning from your back to your side while in a flat bed without using bedrails?: None Help needed moving from lying on your back to sitting on the side of a flat bed without using bedrails?: None Help needed moving to and from a bed to a chair (including a wheelchair)?: None Help needed standing up from a chair using your arms (e.g., wheelchair or bedside chair)?: None Help needed to walk in hospital room?: A Little Help needed climbing 3-5 steps with a railing? : A Little 6 Click Score: 22    End of Session    Activity Tolerance: Patient tolerated treatment well Patient left: in chair;with call bell/phone within reach;with family/visitor present Nurse Communication: Mobility status PT Visit Diagnosis: Other abnormalities of gait and mobility (R26.89)    Time: 3009-2330 PT Time Calculation (min) (ACUTE ONLY): 17 min   Charges:   PT Evaluation $PT Eval Low Complexity: [place], PT, DPT Acute Rehabilitation Services  Personal: [place] [facility] Office: [place] 12/02/2022, 2:15 PM

## 2022-12-03 ENCOUNTER — Encounter: Payer: Self-pay | Admitting: Hematology and Oncology

## 2022-12-03 ENCOUNTER — Other Ambulatory Visit (HOSPITAL_COMMUNITY): Payer: Self-pay

## 2022-12-03 DIAGNOSIS — I509 Heart failure, unspecified: Secondary | ICD-10-CM | POA: Diagnosis not present

## 2022-12-03 DIAGNOSIS — I5021 Acute systolic (congestive) heart failure: Secondary | ICD-10-CM | POA: Diagnosis not present

## 2022-12-03 DIAGNOSIS — E8770 Fluid overload, unspecified: Secondary | ICD-10-CM | POA: Diagnosis not present

## 2022-12-03 DIAGNOSIS — N179 Acute kidney failure, unspecified: Secondary | ICD-10-CM

## 2022-12-03 DIAGNOSIS — I10 Essential (primary) hypertension: Secondary | ICD-10-CM | POA: Diagnosis not present

## 2022-12-03 LAB — CBC WITH DIFFERENTIAL/PLATELET
Abs Immature Granulocytes: 0.03 10*3/uL (ref 0.00–0.07)
Basophils Absolute: 0 10*3/uL (ref 0.0–0.1)
Basophils Relative: 0 %
Eosinophils Absolute: 0.1 10*3/uL (ref 0.0–0.5)
Eosinophils Relative: 1 %
HCT: 34.9 % — ABNORMAL LOW (ref 36.0–46.0)
Hemoglobin: 10.4 g/dL — ABNORMAL LOW (ref 12.0–15.0)
Immature Granulocytes: 0 %
Lymphocytes Relative: 17 %
Lymphs Abs: 2 10*3/uL (ref 0.7–4.0)
MCH: 21.6 pg — ABNORMAL LOW (ref 26.0–34.0)
MCHC: 29.8 g/dL — ABNORMAL LOW (ref 30.0–36.0)
MCV: 72.4 fL — ABNORMAL LOW (ref 80.0–100.0)
Monocytes Absolute: 1.1 10*3/uL — ABNORMAL HIGH (ref 0.1–1.0)
Monocytes Relative: 9 %
Neutro Abs: 8.5 10*3/uL — ABNORMAL HIGH (ref 1.7–7.7)
Neutrophils Relative %: 73 %
Platelets: 332 10*3/uL (ref 150–400)
RBC: 4.82 MIL/uL (ref 3.87–5.11)
RDW: 18 % — ABNORMAL HIGH (ref 11.5–15.5)
WBC: 11.7 10*3/uL — ABNORMAL HIGH (ref 4.0–10.5)
nRBC: 0 % (ref 0.0–0.2)

## 2022-12-03 LAB — LIPID PANEL
Cholesterol: 94 mg/dL (ref 0–200)
HDL: 33 mg/dL — ABNORMAL LOW (ref >40)
LDL Cholesterol: 48 mg/dL (ref 0–99)
Total CHOL/HDL Ratio: 2.8 RATIO
Triglycerides: 64 mg/dL (ref <150)
VLDL: 13 mg/dL (ref 0–40)

## 2022-12-03 LAB — COMPREHENSIVE METABOLIC PANEL
ALT: 47 U/L — ABNORMAL HIGH (ref 0–44)
AST: 42 U/L — ABNORMAL HIGH (ref 15–41)
Albumin: 3.4 g/dL — ABNORMAL LOW (ref 3.5–5.0)
Alkaline Phosphatase: 79 U/L (ref 38–126)
Anion gap: 10 (ref 5–15)
BUN: 17 mg/dL (ref 6–20)
CO2: 23 mmol/L (ref 22–32)
Calcium: 9.3 mg/dL (ref 8.9–10.3)
Chloride: 105 mmol/L (ref 98–111)
Creatinine, Ser: 1.48 mg/dL — ABNORMAL HIGH (ref 0.44–1.00)
GFR, Estimated: 41 mL/min — ABNORMAL LOW (ref >60)
Glucose, Bld: 125 mg/dL — ABNORMAL HIGH (ref 70–99)
Potassium: 5 mmol/L (ref 3.5–5.1)
Sodium: 138 mmol/L (ref 135–145)
Total Bilirubin: 2.3 mg/dL — ABNORMAL HIGH (ref 0.3–1.2)
Total Protein: 6.9 g/dL (ref 6.5–8.1)

## 2022-12-03 LAB — HEMOGLOBIN A1C
Hgb A1c MFr Bld: 6.4 % — ABNORMAL HIGH (ref 4.8–5.6)
Mean Plasma Glucose: 137 mg/dL

## 2022-12-03 LAB — PHOSPHORUS: Phosphorus: 2.8 mg/dL (ref 2.5–4.6)

## 2022-12-03 LAB — MAGNESIUM: Magnesium: 2.3 mg/dL (ref 1.7–2.4)

## 2022-12-03 LAB — HEPARIN LEVEL (UNFRACTIONATED): Heparin Unfractionated: 0.59 IU/mL (ref 0.30–0.70)

## 2022-12-03 LAB — TROPONIN I (HIGH SENSITIVITY): Troponin I (High Sensitivity): 382 ng/L (ref <18)

## 2022-12-03 MED ORDER — FUROSEMIDE 10 MG/ML IJ SOLN
80.0000 mg | Freq: Two times a day (BID) | INTRAMUSCULAR | Status: DC
  Administered 2022-12-03 (×2): 80 mg via INTRAVENOUS
  Filled 2022-12-03 (×2): qty 8

## 2022-12-03 MED ORDER — EMPAGLIFLOZIN 10 MG PO TABS
10.0000 mg | ORAL_TABLET | Freq: Every day | ORAL | Status: DC
  Administered 2022-12-03 – 2022-12-11 (×8): 10 mg via ORAL
  Filled 2022-12-03 (×9): qty 1

## 2022-12-03 NOTE — Evaluation (Signed)
Occupational Therapy Evaluation Patient Details Name: Stephanie Gates MRN: [id number] DOB: 28-Apr-1966 Today's Date: 12/03/2022   History of Present Illness Pt is a 56 y.o. F admitted 11/30/22 with SOB, BLE edema, emesis. Workup for new onset CHF. PMH includes HTN, HLD, DM, obesity.   Clinical Impression   Pt reports independence at baseline with ADLs and functional mobility, lives with children who can assist at d/c. Pt currently needing min guard-mod A for ADLs, mod I for transfers and in room mobility without AD. Began education on energy conservation strategies (handout in room), pt verbalized understanding. Pt presenting with impairments listed below, will follow acutely. Anticipate no OT follow up needs at d/c.     Recommendations for follow up therapy are one component of a multi-disciplinary discharge planning process, led by the attending physician.  Recommendations may be updated based on patient status, additional functional criteria and insurance authorization.   Follow Up Recommendations  No OT follow up     Assistance Recommended at Discharge PRN  Patient can return home with the following A little help with bathing/dressing/bathroom;Assistance with cooking/housework;Assist for transportation    Functional Status Assessment  Patient has had a recent decline in their functional status and demonstrates the ability to make significant improvements in function in a reasonable and predictable amount of time.  Equipment Recommendations  BSC/3in1 (as shower seat)    Recommendations for Other Services PT consult     Precautions / Restrictions Precautions Precautions: Other (comment) Precaution Comments: watch HR Restrictions Weight Bearing Restrictions: No      Mobility Bed Mobility               General bed mobility comments: seated EOB upon arrival and departure    Transfers Overall transfer level: Modified independent Equipment used: None                       Balance Overall balance assessment: Needs assistance Sitting-balance support: No upper extremity supported, Feet supported Sitting balance-Leahy Scale: Good     Standing balance support: No upper extremity supported, During functional activity Standing balance-Leahy Scale: Fair                             ADL either performed or assessed with clinical judgement   ADL Overall ADL's : Needs assistance/impaired Eating/Feeding: Set up   Grooming: Set up   Upper Body Bathing: Minimal assistance   Lower Body Bathing: Moderate assistance   Upper Body Dressing : Minimal assistance   Lower Body Dressing: Moderate assistance   Toilet Transfer: Min guard;Ambulation;Regular Museum/gallery exhibitions officer and Hygiene: Min guard       Functional mobility during ADLs: Min guard       Vision   Vision Assessment?: No apparent visual deficits     Agricultural engineer Tested?: No   Praxis Praxis Praxis tested?: Not tested    Pertinent Vitals/Pain Pain Assessment Pain Assessment: Faces Pain Score: 3  Faces Pain Scale: Hurts a little bit Pain Location: nausea Pain Intervention(s): Limited activity within patient's tolerance, Monitored during session     Hand Dominance     Extremity/Trunk Assessment Upper Extremity Assessment Upper Extremity Assessment: Overall WFL for tasks assessed   Lower Extremity Assessment Lower Extremity Assessment: Defer to PT evaluation   Cervical / Trunk Assessment Cervical / Trunk Assessment: Normal   Communication Communication Communication: No difficulties   Cognition Arousal/Alertness: Awake/alert Behavior During  Therapy: WFL for tasks assessed/performed Overall Cognitive Status: Within Functional Limits for tasks assessed                                       General Comments  VSS on RA    Exercises     Shoulder Instructions      Home Living  Family/patient expects to be discharged to:: Private residence Living Arrangements: Children Available Help at Discharge: Family;Available 24 hours/day Type of Home: House Home Access: Level entry     Home Layout: One level     Bathroom Shower/Tub: Tub/shower unit;Curtain   Bathroom Toilet: Handicapped height     Home Equipment: [place] - single point   Additional Comments: lives with daughter who also works from home and can be available to assist      Prior Functioning/Environment Prior Level of Function : Independent/Modified Independent;Working/employed;Driving             Mobility Comments: ' ADLs Comments: independent        OT Problem List: Decreased strength;Decreased range of motion;Decreased activity tolerance;Impaired balance (sitting and/or standing)      OT Treatment/Interventions: Self-care/ADL training;Therapeutic exercise;Energy conservation;DME and/or AE instruction;Therapeutic activities;Patient/family education;Balance training    OT Goals(Current goals can be found in the care plan section) Acute Rehab OT Goals Patient Stated Goal: none stated OT Goal Formulation: With patient Time For Goal Achievement: 12/17/22 Potential to Achieve Goals: Good ADL Goals Pt Will Perform Upper Body Dressing: Independently;sitting Pt Will Perform Lower Body Dressing: Independently;sitting/lateral leans;sit to/from stand Pt Will Transfer to Toilet: Independently;ambulating;regular height toilet Pt Will Perform Tub/Shower Transfer: Tub transfer;Shower transfer;ambulating;shower seat Additional ADL Goal #1: pt will verbalize 3 energy conservation strategies in prep for ADLs  OT Frequency: Min 2X/week    Co-evaluation              AM-PAC OT "6 Clicks" Daily Activity     Outcome Measure Help from another person eating meals?: None Help from another person taking care of personal grooming?: None Help from another person toileting, which includes using toliet,  bedpan, or urinal?: A Little Help from another person bathing (including washing, rinsing, drying)?: A Lot Help from another person to put on and taking off regular upper body clothing?: A Little Help from another person to put on and taking off regular lower body clothing?: A Lot 6 Click Score: 18   End of Session Nurse Communication: Mobility status  Activity Tolerance: Patient tolerated treatment well Patient left: in bed;with call bell/phone within reach;with family/visitor present  OT Visit Diagnosis: Unsteadiness on feet (R26.81);Other abnormalities of gait and mobility (R26.89);Muscle weakness (generalized) (M62.81)                Time: 6967-8938 OT Time Calculation (min): 12 min Charges:  OT General Charges $OT Visit: 1 Visit OT Evaluation $OT Eval Low Complexity: 1 Low  Renaye Rakers, OTD, OTR/L SecureChat Preferred Acute Rehab [phone]  Renaye Rakers [place] 12/03/2022, 12:45 PM

## 2022-12-03 NOTE — Progress Notes (Signed)
Stephanie Gates TKP:[id number] DOB: 1966/09/21 DOA: 11/30/2022 PCP: Jonetta Osgood, NP   Subj:  56 y.o. BF PMHx: HTN and morbid obesity s/p gastric bypass   Presenting with SOB, edema.   She reports LE edema, extending up to her abdomen.  She was having difficulty breathing with exertion and with orthopnea.  +emesis, cough for about 3 months.  She had bronchitis -> PNA.  She had been to her PCP and had BP medications changed twice.  PNA was a little over a month ago.  She hasn't been able to eat much, feels like it gets stuck in her throat with early satiety.  Once the fluid came off in the ER, those symptoms have improved.  Cough has been productive of clear, frothy sputum.  No history of CHF.  When her coughing was its worst, she would have chest pain with coughing; that is not happening often now.    Obj: 12/26 A/O x 4, positive chronic nausea.  Negative SOB, negative CP    Objective: VITAL SIGNS: Temp: 98 F (36.7 C) (12/26 0813) Temp Source: Oral (12/26 0813) BP: 124/89 (12/26 0813) Pulse Rate: 90 (12/26 0813)   VENTILATOR SETTINGS: **   Intake/Output Summary (Last 24 hours) at 12/03/2022 5170 Last data filed at 12/03/2022 0174 Gross per 24 hour  Intake 577.2 ml  Output 250 ml  Net 327.2 ml      Exam: Physical Exam:  General: A/O x 4, No acute respiratory distress Eyes: negative scleral hemorrhage, negative anisocoria, negative icterus ENT: Negative Runny nose, negative gingival bleeding, Neck:  Negative scars, masses, torticollis, lymphadenopathy, JVD Lungs: Clear to auscultation bilaterally without wheezes or crackles (difficult to auscultate secondary to patient's body habitus) Cardiovascular: Regular rate and rhythm without murmur gallop or rub normal S1 and S2 Abdomen: MORBIDLY OBESE, negative abdominal pain, nondistended, positive soft, bowel sounds, no rebound, no ascites, no appreciable mass Extremities: anasarca  Skin: Negative rashes, lesions,  ulcers Psychiatric:  Negative depression, negative anxiety, negative fatigue, negative mania  Central nervous system:  Cranial nerves II through XII intact, tongue/uvula midline, all extremities muscle strength 5/5, sensation intact throughout, , negative dysarthria, negative expressive aphasia, negative receptive aphasia.   .     Mobility Assessment (last 72 hours)     Mobility Assessment     Row Name 12/02/22 2000 12/02/22 1412 12/02/22 1033 12/01/22 1633 12/01/22 1455   Does patient have an order for bedrest or is patient medically unstable No - Continue assessment -- No - Continue assessment No - Continue assessment No - Continue assessment   What is the highest level of mobility based on the progressive mobility assessment? Level 6 (Walks independently in room and hall) - Balance while walking in room without assist - Complete Level 6 (Walks independently in room and hall) - Balance while walking in room without assist - Complete Level 5 (Walks with assist in room/hall) - Balance while stepping forward/back and can walk in room with assist - Complete Level 5 (Walks with assist in room/hall) - Balance while stepping forward/back and can walk in room with assist - Complete Level 5 (Walks with assist in room/hall) - Balance while stepping forward/back and can walk in room with assist - Complete    Row Name 12/01/22 06:00:36           Does patient have an order for bedrest or is patient medically unstable Yes- Bedfast (Level 1) - Complete  DVT prophylaxis: DC per cardiology Code Status:  Family Communication:  Status is: Inpatient    Dispo: The patient is from: Home              Anticipated d/c is to: Home vs SNF              Anticipated d/c date is:               Patient currently is not medically stable to d/c.    Procedures/Significant Events: 12/25 Echocardiogram Left Ventricle: LVEF by estimation=<20%. The left ventricle has severely decreased  function. The left ventricle demonstrates global hypokinesis.The left ventricular internal cavity size was severely dilated.  -Grade II diastolic dysfunction (pseudonormalization). Elevated left  ventricular end-diastolic pressure.   Consultants:  Cardiology   Cultures   Antimicrobials:    Assessment & Plan: Covid vaccination;   Principal Problem:   New onset of congestive heart failure ([place]) Active Problems:   Morbid obesity with BMI of 50.0-59.9, adult ([place])   Essential (primary) hypertension   Diabetes mellitus type 2 in obese ([facility])   Hypokalemia   Dyslipidemia   Acute systolic and Diastolic CHF  -Strict in and out -1.7 L - Daily weight Filed Weights   11/30/22 1913 12/02/22 0500 12/03/22 0500  Weight: (!) 139.7 kg (!) 139.2 kg (!) 139.7 kg  -Remains grossly volume overloaded.  Potassium still too low for aggressive IV diuresis.  We will give her another 40 mEq every 4 hours for 3 doses.   -Plan to recheck labs this afternoon around 2 PM.  Hopefully we can then start aggressive IV diuretics this afternoon. -Echo pending today. - CRP minimally elevated.  ESR negative.  HIV negative.  TSH unremarkable. -She will ultimately need left heart catheterization but we need to diurese her effectively.  Cannot do this currently given very low potassium level and frequent ectopy. -6/24 hemoglobin A1c= 6.4 -12/25 echocardiogram shows EF<20% -12/25 Coreg 3.125 mg twice daily. -12/25 hydralazine PRN -12/25 spironolactone 25 mg daily -12/26 Lasix IV 80 mg BID   Elevated troponin, demand ischemia versus non-STEMI -Echo pending.   -Negative CP, positive nausea and perception of SOB  -12/26 per cardiology has completed 48 hours of heparin. stop   -Hypokalemia - Potassium goal> 4 - 12/25 potassium 20 mEq -12/20 5 repeat K still low.  Potassium IV 30 mEq  Hypomagnesmia - Magnesium goal> 2 - 12/25 Magnesium IV 2 g   Frequent PVCs/nonsustained ventricular tachycardia -Most  likely secondary to potassium/magnesium abnormality - Aggressively replete potassium see above -Repeat EKG today to get a better idea for QTc.    AKI (baseline Cr 0.97) Lab Results  Component Value Date   CREATININE 1.48 (H) 12/03/2022   CREATININE 1.18 (H) 12/02/2022   CREATININE 1.26 (H) 12/02/2022   CREATININE 1.22 (H) 12/01/2022   CREATININE 1.15 (H) 11/30/2022  -12/26 worsening with diuresis but given patient's cardiac function most likely her new baseline  Morbidly obese (BMI 51.07 kg/m). -Address as outpatient with PCP          Mobility Assessment (last 72 hours)     Mobility Assessment     Row Name 12/02/22 2000 12/02/22 1412 12/02/22 1033 12/01/22 1633 12/01/22 1455   Does patient have an order for bedrest or is patient medically unstable No - Continue assessment -- No - Continue assessment No - Continue assessment No - Continue assessment   What is the highest level of mobility based on the progressive mobility assessment? Level 6 (Walks  independently in room and hall) - Balance while walking in room without assist - Complete Level 6 (Walks independently in room and hall) - Balance while walking in room without assist - Complete Level 5 (Walks with assist in room/hall) - Balance while stepping forward/back and can walk in room with assist - Complete Level 5 (Walks with assist in room/hall) - Balance while stepping forward/back and can walk in room with assist - Complete Level 5 (Walks with assist in room/hall) - Balance while stepping forward/back and can walk in room with assist - Complete    Row Name 12/01/22 06:00:36           Does patient have an order for bedrest or is patient medically unstable Yes- Bedfast (Level 1) - Complete                 Time: 50 minutes         Care during the described time interval was provided by me .  I have reviewed this patient's available data, including medical history, events of note, physical examination, and all  test results as part of my evaluation.

## 2022-12-03 NOTE — Progress Notes (Signed)
Patient had four episode of runny stool through the night.

## 2022-12-03 NOTE — Progress Notes (Signed)
   Heart Failure Stewardship Pharmacist Progress Note   PCP: Jonetta Osgood, NP PCP-Cardiologist: None    HPI:  56 yo F with PMH of HTN and morbid obesity s/p gastric bypass.   She presented to the ED on 12/23 with shortness of breath, nausea, LE edema, orthopnea, and chest pressure. Treated for pneumonia 2-3 months prior. ECHO 12/25 showed LVEF <20%, global hypokinesis, G2DD, RV mildly reduced. Pending [facility] +/- cardiac MRI this admission once more euvolemic.   Current HF Medications: Diuretic: furosemide 80 mg IV BID Beta Blocker: carvedilol 3.125 mg BID MRA: spironolactone 25 mg daily SGLT2i: Jardiance 10 mg daily  Prior to admission HF Medications: None  Pertinent Lab Values: Serum creatinine 1.48, BUN 17, Potassium 5.0, Sodium 138, BNP 777.6, Magnesium 2.3, A1c 6.4   Vital Signs: Weight: 307 lbs (admission weight: 308 lbs) Blood pressure: 120/80s  Heart rate: 80s  I/O: net even  Medication Assistance / Insurance Benefits Check: Does the patient have prescription insurance?  Yes Type of insurance plan: [facility] commercial insurance  Outpatient Pharmacy:  Prior to admission outpatient pharmacy: [facility] Is the patient willing to use [facility] at discharge? Yes Is the patient willing to transition their outpatient pharmacy to utilize a [facility] outpatient pharmacy?   Pending    Assessment: 1. Acute systolic CHF (LVEF <38%), pending ischemic evaluation. NYHA class IV symptoms. - Remains volume overloaded, hypokalemia resolved. Restarting furosemide 80 mg IV BID. Strict I/Os and daily weights. Keep K>4 and Mag>2. - Continue carvedilol 3.125 mg BID - Consider starting losartan 25 mg daily tomorrow - Continue spironolactone 25 mg daily - Agree with adding Jardiance 10 mg daily   Plan: 1) Medication changes recommended at this time: - Continue IV diuresis  - Start losartan 25 mg daily tomorrow  2) Patient assistance: Delene Loll copay $60, copay card lowers  to $10 per fill - Jardiance copay $30, copay card lowers to $10 per month  3)  Education  - To be completed prior to discharge  Kerby Nora, PharmD, BCPS Heart Failure Cytogeneticist Phone [phone]

## 2022-12-03 NOTE — Telephone Encounter (Signed)
Send mesaage

## 2022-12-03 NOTE — TOC Benefit Eligibility Note (Signed)
Patient Teacher, English as a foreign language completed.    The patient is currently admitted and upon discharge could be taking Entresto 24-26 mg.  The current 30 day co-pay is $60.00.   The patient is currently admitted and upon discharge could be taking Jardiance 10 mg.  The current 30 day co-pay is $30.00.   The patient is insured through [place], [place] Patient Advocate Specialist [place] Patient Advocate Team Direct Number: [phone]  Fax: [phone]

## 2022-12-03 NOTE — Progress Notes (Signed)
Cardiology Progress Note  Patient ID: Stephanie Gates MRN: [id number] DOB: 12/19/1965 Date of Encounter: 12/03/2022  Primary Cardiologist: None  Subjective   Chief Complaint: SOB/orthopnea   HPI: Potassium level acceptable.  Remains volume overloaded.  Still with symptoms of orthopnea and PND.  EF less than 20%.  ROS:  All other ROS reviewed and negative. Pertinent positives noted in the HPI.     Inpatient Medications  Scheduled Meds:  aspirin  81 mg Oral Daily   atorvastatin  40 mg Oral Daily   carvedilol  3.125 mg Oral BID WC   docusate sodium  100 mg Oral BID   furosemide  80 mg Intravenous BID   multivitamin with minerals  1 tablet Oral Daily   pantoprazole  40 mg Oral Daily   Ensure Max Protein  11 oz Oral BID   sodium chloride flush  3 mL Intravenous Q12H   spironolactone  25 mg Oral Daily   Continuous Infusions:  heparin 1,400 Units/hr (12/03/22 0515)   PRN Meds: acetaminophen **OR** acetaminophen, albuterol, bisacodyl, guaiFENesin-dextromethorphan, hydrALAZINE, nitroGLYCERIN, ondansetron **OR** ondansetron (ZOFRAN) IV, oxyCODONE, polyethylene glycol, traZODone   Vital Signs   Vitals:   12/02/22 2000 12/03/22 0019 12/03/22 0500 12/03/22 0813  BP: 110/75 121/82 122/66 124/89  Pulse: 88 88 85 90  Resp: _0 Temp: 97.6 F (36.4 C) 97.7 F (36.5 C) 98.2 F (36.8 C) 98 F (36.7 C)  TempSrc: Oral Oral Oral Oral  SpO2: 100% 100% 100% 100%  Weight:   (!) 139.7 kg   Height:        Intake/Output Summary (Last 24 hours) at 12/03/2022 0916 Last data filed at 12/03/2022 0514 Gross per 24 hour  Intake 577.2 ml  Output 250 ml  Net 327.2 ml      12/03/2022    5:00 AM 12/02/2022    5:00 AM 11/30/2022    7:13 PM  Last 3 Weights  Weight (lbs) 307 lb 14.4 oz 306 lb 14.1 oz 308 lb  Weight (kg) 139.663 kg 139.2 kg 139.708 kg      Telemetry  Overnight telemetry shows sinus rhythm in the 80s, which I personally reviewed.   ECG  The most recent  ECG shows sinus rhythm heart rate 91, left bundle branch block, which I personally reviewed.   Physical Exam   Vitals:   12/02/22 2000 12/03/22 0019 12/03/22 0500 12/03/22 0813  BP: 110/75 121/82 122/66 124/89  Pulse: 88 88 85 90  Resp: _1 Temp: 97.6 F (36.4 C) 97.7 F (36.5 C) 98.2 F (36.8 C) 98 F (36.7 C)  TempSrc: Oral Oral Oral Oral  SpO2: 100% 100% 100% 100%  Weight:   (!) 139.7 kg   Height:        Intake/Output Summary (Last 24 hours) at 12/03/2022 0916 Last data filed at 12/03/2022 0514 Gross per 24 hour  Intake 577.2 ml  Output 250 ml  Net 327.2 ml       12/03/2022    5:00 AM 12/02/2022    5:00 AM 11/30/2022    7:13 PM  Last 3 Weights  Weight (lbs) 307 lb 14.4 oz 306 lb 14.1 oz 308 lb  Weight (kg) 139.663 kg 139.2 kg 139.708 kg    Body mass index is 51.24 kg/m.  General: Well nourished, well developed, in no acute distress Head: Atraumatic, normal size  Eyes: PEERLA, EOMI  Neck: Supple, JVD 12-15 cmH2O Endocrine: No thryomegaly Cardiac: Normal S1, S2; RRR; no  murmurs, rubs, or gallops Lungs: Crackles at the lung bases Abd: Soft, nontender, no hepatomegaly  Ext: 2+ pitting edema Musculoskeletal: No deformities, BUE and BLE strength normal and equal Skin: Warm and dry, no rashes   Neuro: Alert and oriented to person, place, time, and situation, CNII-XII grossly intact, no focal deficits  Psych: Normal mood and affect   Labs  High Sensitivity Troponin:   Recent Labs  Lab 11/30/22 2120 11/30/22 2357 12/01/22 0904 12/02/22 2232 12/03/22 0027  TROPONINIHS 895* 917* 720* 397* 382*     Cardiac EnzymesNo results for input(s): "TROPONINI" in the last 168 hours. No results for input(s): "TROPIPOC" in the last 168 hours.  Chemistry Recent Labs  Lab 11/30/22 1926 12/01/22 1519 12/02/22 0103 12/02/22 1440 12/03/22 0137  NA 138   < > 139 138 138  K 2.6*   < > 3.0* 3.5 5.0  CL 100   < > 101 104 105  CO2 29   < > _0 GLUCOSE 117*   <  > 124* 115* 125*  BUN 18   < > _1 CREATININE 1.15*   < > 1.26* 1.18* 1.48*  CALCIUM 8.8*   < > 9.3 9.2 9.3  PROT 6.9  --   --   --  6.9  ALBUMIN 3.3*  --   --   --  3.4*  AST 38  --   --   --  42*  ALT 48*  --   --   --  47*  ALKPHOS 87  --   --   --  79  BILITOT 1.5*  --   --   --  2.3*  GFRNONAA 56*   < > 50* 54* 41*  ANIONGAP 9   < > _2 < > = values in this interval not displayed.    Hematology Recent Labs  Lab 12/01/22 1519 12/02/22 0103 12/03/22 0137  WBC 10.8* 11.2* 11.7*  RBC 4.81 4.92 4.82  HGB 10.5* 10.9* 10.4*  HCT 34.3* 34.9* 34.9*  MCV 71.3* 70.9* 72.4*  MCH 21.8* 22.2* 21.6*  MCHC 30.6 31.2 29.8*  RDW 17.9* 17.8* 18.0*  PLT 357 373 332   BNP Recent Labs  Lab 11/30/22 2119  BNP 777.6*    DDimer No results for input(s): "DDIMER" in the last 168 hours.   Radiology  ECHOCARDIOGRAM COMPLETE  Result Date: 12/02/2022    ECHOCARDIOGRAM REPORT   Patient Name:   Stephanie Gates [facility] Date of Exam: 12/02/2022 Medical Rec #:  [id number]            Height:       65.0 in Accession #:    [id number]           Weight:       306.9 lb Date of Birth:  17-Dec-1965            BSA:          2.373 m Patient Age:    56 years             BP:           122/103 mmHg Patient Gender: F                    HR:           99 bpm. Exam Location:  Inpatient Procedure: 2D Echo, Cardiac Doppler, Color Doppler and Intracardiac  Opacification Agent                                 MODIFIED REPORT:   This report was modified by Skeet Latch MD on 12/02/2022 due to mitral                                  regurgitation.  Indications:     CHF-Acute Systolic 510.25 / E52.77  History:         Patient has no prior history of Echocardiogram examinations.                  Acute systolic heart failure, Arrythmias:Frequent                  PVCs/nonsustained ventricular tachycardia; Risk                  Factors:Hypertension. Elevated troponin, demand ischemia versus                   non-STEMI.  Sonographer:     Darlina Sicilian RDCS Referring Phys:  [place] Diagnosing Phys: Skeet Latch MD IMPRESSIONS  1. Left ventricular ejection fraction, by estimation, is <20%. The left ventricle has severely decreased function. The left ventricle demonstrates global hypokinesis. The left ventricular internal cavity size was severely dilated. Left ventricular diastolic parameters are consistent with Grade II diastolic dysfunction (pseudonormalization). Elevated left ventricular end-diastolic pressure.  2. Right ventricular systolic function is mildly reduced. The right ventricular size is normal. There is normal pulmonary artery systolic pressure.  3. Left atrial size was mildly dilated.  4. Right atrial size was mildly dilated.  5. Functional mitral regurgitation due to left ventricular disfunction and annular dilatation. The mitral valve is degenerative. Moderate to severe mitral valve regurgitation. No evidence of mitral stenosis.  6. Tricuspid valve regurgitation is severe.  7. The aortic valve is tricuspid. Aortic valve regurgitation is trivial. No aortic stenosis is present.  8. Pulmonic valve regurgitation is moderate.  9. The inferior vena cava is normal in size with greater than 50% respiratory variability, suggesting right atrial pressure of 3 mmHg. FINDINGS  Left Ventricle: Left ventricular ejection fraction, by estimation, is <20%. The left ventricle has severely decreased function. The left ventricle demonstrates global hypokinesis. Definity contrast agent was given IV to delineate the left ventricular endocardial borders. The left ventricular internal cavity size was severely dilated. There is no left ventricular hypertrophy. Left ventricular diastolic parameters are consistent with Grade II diastolic dysfunction (pseudonormalization). Elevated left ventricular end-diastolic pressure. Right Ventricle: The right ventricular size is normal. No increase in right ventricular wall  thickness. Right ventricular systolic function is mildly reduced. There is normal pulmonary artery systolic pressure. The tricuspid regurgitant velocity is 2.12 m/s, and with an assumed right atrial pressure of 15 mmHg, the estimated right ventricular systolic pressure is 82.4 mmHg. Left Atrium: Left atrial size was mildly dilated. Right Atrium: Right atrial size was mildly dilated. Pericardium: Trivial pericardial effusion is present. The pericardial effusion is circumferential. Mitral Valve: Functional mitral regurgitation due to left ventricular disfunction and annular dilatation. The mitral valve is degenerative in appearance. Moderate to severe mitral valve regurgitation. No evidence of mitral valve stenosis. Tricuspid Valve: The tricuspid valve is grossly normal. Tricuspid valve regurgitation is severe. No evidence of tricuspid stenosis. Aortic Valve: The aortic valve is tricuspid.  Aortic valve regurgitation is trivial. No aortic stenosis is present. Pulmonic Valve: The pulmonic valve was grossly normal. Pulmonic valve regurgitation is moderate. No evidence of pulmonic stenosis. Aorta: The aortic root is normal in size and structure. Venous: The inferior vena cava is normal in size with greater than 50% respiratory variability, suggesting right atrial pressure of 3 mmHg. IAS/Shunts: No atrial level shunt detected by color flow Doppler.  LEFT VENTRICLE PLAX 2D LVIDd:         7.30 cm      Diastology LVIDs:         6.10 cm      LV e' medial:    3.98 cm/s LV PW:         0.70 cm      LV E/e' medial:  20.7 LV IVS:        0.80 cm      LV e' lateral:   8.25 cm/s LVOT diam:     2.00 cm      LV E/e' lateral: 10.0 LV SV:         30 LV SV Index:   13 LVOT Area:     3.14 cm  LV Volumes (MOD) LV vol d, MOD A2C: 236.0 ml LV vol d, MOD A4C: 212.0 ml LV vol s, MOD A2C: 184.0 ml LV vol s, MOD A4C: 164.0 ml LV SV MOD A2C:     52.0 ml LV SV MOD A4C:     212.0 ml LV SV MOD BP:      46.2 ml RIGHT VENTRICLE RV S prime:     8.85  cm/s TAPSE (M-mode): 1.2 cm LEFT ATRIUM             Index        RIGHT ATRIUM           Index LA diam:        4.10 cm 1.73 cm/m   RA Area:     22.10 cm LA Vol (A2C):   81.3 ml 34.26 ml/m  RA Volume:   68.40 ml  28.83 ml/m LA Vol (A4C):   79.4 ml 33.46 ml/m LA Biplane Vol: 81.0 ml 34.14 ml/m  AORTIC VALVE             PULMONIC VALVE LVOT Vmax:   86.90 cm/s  PR End Diast Vel: 8.41 msec LVOT Vmean:  47.600 cm/s LVOT VTI:    0.097 m  AORTA Ao Root diam: 2.70 cm Ao Asc diam:  2.50 cm MITRAL VALVE                  TRICUSPID VALVE MV Area (PHT): 3.22 cm       TR Peak grad:   18.0 mmHg MV Decel Time: 236 msec       TR Vmax:        212.00 cm/s MR Peak grad:    74.0 mmHg MR Mean grad:    48.0 mmHg    SHUNTS MR Vmax:         430.00 cm/s  Systemic VTI:  0.10 m MR Vmean:        329.0 cm/s   Systemic Diam: 2.00 cm MR PISA:         1.57 cm MR PISA Eff ROA: 14 mm MR PISA Radius:  0.50 cm MV E velocity: 82.30 cm/s Skeet Latch MD Electronically signed by Skeet Latch MD Signature Date/Time: 12/02/2022/12:13:17 PM    Final (Updated)     Cardiac Studies  TTE 12/02/2022  1. Left ventricular ejection fraction, by estimation, is <20%. The left  ventricle has severely decreased function. The left ventricle demonstrates  global hypokinesis. The left ventricular internal cavity size was severely  dilated. Left ventricular  diastolic parameters are consistent with Grade II diastolic dysfunction  (pseudonormalization). Elevated left ventricular end-diastolic pressure.   2. Right ventricular systolic function is mildly reduced. The right  ventricular size is normal. There is normal pulmonary artery systolic  pressure.   3. Left atrial size was mildly dilated.   4. Right atrial size was mildly dilated.   5. Functional mitral regurgitation due to left ventricular disfunction  and annular dilatation. The mitral valve is degenerative. Moderate to  severe mitral valve regurgitation. No evidence of mitral stenosis.    6. Tricuspid valve regurgitation is severe.   7. The aortic valve is tricuspid. Aortic valve regurgitation is trivial.  No aortic stenosis is present.   8. Pulmonic valve regurgitation is moderate.   9. The inferior vena cava is normal in size with greater than 50%  respiratory variability, suggesting right atrial pressure of 3 mmHg.   Patient Profile  Stephanie Gates is a 56 y.o. female with hypertension, obesity who was admitted on 12/01/2022 for new onset systolic heart failure.  Assessment & Plan   # Acute systolic heart failure, EF less than 20% # Severe tricuspid regurgitation/severe mitral valve regurgitation -Remains volume overloaded.  Aggressive diuresis was precluded due to severely low potassium levels.  They are now repleted. -Increase Lasix to 80 mg IV twice daily. -Continue Aldactone 25 mg daily. -Carvedilol 3.125 mg twice daily added yesterday.  Tolerating this well. -Slight rise in serum creatinine.  Will likely add ARB tomorrow. -Add Jardiance 10 mg daily. -HIV is negative.  A1c 6.4.  She does have a left bundle branch block with significant dyssynchrony.  She will need a left and right heart catheterization this admission.  Likely plan for this hopefully Thursday if we can improve her volume status.  She ultimately will need cardiac MRI but this can be done as an outpatient.  Continue with medical therapy for now.  # Elevated troponin, demand ischemia versus non-STEMI -No symptoms of angina.  Likely this is demand.  No concern for myocarditis based on CRP and ESR. -She has completed 48 hours of heparin.  We will stop this. -Continue aspirin and statin.  Plan for left heart catheterization this admission.  # Hypokalemia # Hypomagnesemia # Frequent PVCs -Resolved.    For questions or updates, please contact [place] Please consult [email or web address] for contact info under     Signed, [place] T. Audie Box, MD, [place]   12/03/2022 9:16 AM

## 2022-12-03 NOTE — Progress Notes (Signed)
ANTICOAGULATION CONSULT NOTE  Pharmacy Consult for heparin Indication: chest pain/ACS  Allergies  Allergen Reactions   Bisoprolol Diarrhea   Penicillin G Itching   Penicillins Itching    Patient Measurements: Height: '5\' 5"'$  (165.1 cm) Weight: (!) 139.7 kg (307 lb 14.4 oz) IBW/kg (Calculated) : 57 Heparin Dosing Weight: 100kg  Vital Signs: Temp: 98 F (36.7 C) (12/26 0813) Temp Source: Oral (12/26 0813) BP: 124/89 (12/26 0813) Pulse Rate: 90 (12/26 0813)  Labs: Recent Labs    12/01/22 0904 12/01/22 1519 12/01/22 1523 12/02/22 0103 12/02/22 1440 12/02/22 2232 12/03/22 0027 12/03/22 0137  HGB  --  10.5*  --  10.9*  --   --   --  10.4*  HCT  --  34.3*  --  34.9*  --   --   --  34.9*  PLT  --  357  --  373  --   --   --  332  HEPARINUNFRC  --   --  0.49 0.59  --   --   --  0.59  CREATININE  --  1.22*  --  1.26* 1.18*  --   --  1.48*  TROPONINIHS 720*  --   --   --   --  397* 382*  --      Estimated Creatinine Clearance: 60.4 mL/min (A) (by C-G formula based on SCr of 1.48 mg/dL (H)).   Medical History: Past Medical History:  Diagnosis Date   Allergy    seasonal   Anemia    Arthritis    right hip   Gastritis    Headache    sinus/allergies   Hypertension    Seasonal allergies    Vertigo    1-2x/month   Wears contact lenses     Assessment: 56yo female c/o intermittent chest discomfort associated w/ dyspnea, nausea, and swelling to BLE/abdomen, troponin found to be elevated, to begin heparin.  Heparin level therapeutic at 0.59, on 1400 units/hr. CBC stable, plts wnl. No s/sx of bleeding or infusion issues.   Goal of Therapy:  Heparin level 0.3-0.7 units/ml Monitor platelets by anticoagulation protocol: Yes   Plan:  Continue heparin infusion at 1400 units/hr  Monitor heparin levels and CBC daily  Benetta Spar, PharmD, BCPS, [facility] Clinical Pharmacist  Please check AMION for all [facility] phone numbers After 10:00 PM, call [place]  [phone]

## 2022-12-04 ENCOUNTER — Inpatient Hospital Stay (HOSPITAL_COMMUNITY): Payer: 59

## 2022-12-04 DIAGNOSIS — I5021 Acute systolic (congestive) heart failure: Secondary | ICD-10-CM | POA: Diagnosis not present

## 2022-12-04 DIAGNOSIS — I4729 Other ventricular tachycardia: Secondary | ICD-10-CM | POA: Insufficient documentation

## 2022-12-04 DIAGNOSIS — I10 Essential (primary) hypertension: Secondary | ICD-10-CM | POA: Diagnosis not present

## 2022-12-04 DIAGNOSIS — N179 Acute kidney failure, unspecified: Secondary | ICD-10-CM

## 2022-12-04 DIAGNOSIS — I071 Rheumatic tricuspid insufficiency: Secondary | ICD-10-CM | POA: Diagnosis not present

## 2022-12-04 DIAGNOSIS — I5A Non-ischemic myocardial injury (non-traumatic): Secondary | ICD-10-CM

## 2022-12-04 DIAGNOSIS — I340 Nonrheumatic mitral (valve) insufficiency: Secondary | ICD-10-CM | POA: Diagnosis not present

## 2022-12-04 DIAGNOSIS — R109 Unspecified abdominal pain: Secondary | ICD-10-CM | POA: Insufficient documentation

## 2022-12-04 LAB — CBC WITH DIFFERENTIAL/PLATELET
Abs Immature Granulocytes: 0.04 10*3/uL (ref 0.00–0.07)
Basophils Absolute: 0 10*3/uL (ref 0.0–0.1)
Basophils Relative: 0 %
Eosinophils Absolute: 0.1 10*3/uL (ref 0.0–0.5)
Eosinophils Relative: 1 %
HCT: 34 % — ABNORMAL LOW (ref 36.0–46.0)
Hemoglobin: 10.5 g/dL — ABNORMAL LOW (ref 12.0–15.0)
Immature Granulocytes: 0 %
Lymphocytes Relative: 22 %
Lymphs Abs: 2.5 10*3/uL (ref 0.7–4.0)
MCH: 22 pg — ABNORMAL LOW (ref 26.0–34.0)
MCHC: 30.9 g/dL (ref 30.0–36.0)
MCV: 71.1 fL — ABNORMAL LOW (ref 80.0–100.0)
Monocytes Absolute: 1.2 10*3/uL — ABNORMAL HIGH (ref 0.1–1.0)
Monocytes Relative: 11 %
Neutro Abs: 7.3 10*3/uL (ref 1.7–7.7)
Neutrophils Relative %: 66 %
Platelets: 338 10*3/uL (ref 150–400)
RBC: 4.78 MIL/uL (ref 3.87–5.11)
RDW: 17.7 % — ABNORMAL HIGH (ref 11.5–15.5)
WBC: 11.2 10*3/uL — ABNORMAL HIGH (ref 4.0–10.5)
nRBC: 0 % (ref 0.0–0.2)

## 2022-12-04 LAB — URINALYSIS, ROUTINE W REFLEX MICROSCOPIC
Bilirubin Urine: NEGATIVE
Glucose, UA: >=500 mg/dL — AB
Hgb urine dipstick: NEGATIVE
Ketones, ur: NEGATIVE mg/dL
Leukocytes,Ua: NEGATIVE
Nitrite: NEGATIVE
Protein, ur: 30 mg/dL — AB
Specific Gravity, Urine: 1.021 (ref 1.005–1.030)
pH: 5 (ref 5.0–8.0)

## 2022-12-04 LAB — COMPREHENSIVE METABOLIC PANEL
ALT: 55 U/L — ABNORMAL HIGH (ref 0–44)
AST: 45 U/L — ABNORMAL HIGH (ref 15–41)
Albumin: 3.4 g/dL — ABNORMAL LOW (ref 3.5–5.0)
Alkaline Phosphatase: 110 U/L (ref 38–126)
Anion gap: 10 (ref 5–15)
BUN: 21 mg/dL — ABNORMAL HIGH (ref 6–20)
CO2: 27 mmol/L (ref 22–32)
Calcium: 9.6 mg/dL (ref 8.9–10.3)
Chloride: 100 mmol/L (ref 98–111)
Creatinine, Ser: 2.02 mg/dL — ABNORMAL HIGH (ref 0.44–1.00)
GFR, Estimated: 28 mL/min — ABNORMAL LOW (ref >60)
Glucose, Bld: 113 mg/dL — ABNORMAL HIGH (ref 70–99)
Potassium: 4.5 mmol/L (ref 3.5–5.1)
Sodium: 137 mmol/L (ref 135–145)
Total Bilirubin: 2 mg/dL — ABNORMAL HIGH (ref 0.3–1.2)
Total Protein: 6.7 g/dL (ref 6.5–8.1)

## 2022-12-04 LAB — MAGNESIUM: Magnesium: 2.1 mg/dL (ref 1.7–2.4)

## 2022-12-04 LAB — PHOSPHORUS: Phosphorus: 3.3 mg/dL (ref 2.5–4.6)

## 2022-12-04 LAB — CREATININE, URINE, RANDOM: Creatinine, Urine: 153 mg/dL

## 2022-12-04 LAB — PROTEIN / CREATININE RATIO, URINE
Creatinine, Urine: 152 mg/dL
Protein Creatinine Ratio: 0.13 mg/mg{Cre} (ref 0.00–0.15)
Total Protein, Urine: 19 mg/dL

## 2022-12-04 LAB — HEPARIN LEVEL (UNFRACTIONATED): Heparin Unfractionated: <0.1 IU/mL — ABNORMAL LOW (ref 0.30–0.70)

## 2022-12-04 MED ORDER — ALUM & MAG HYDROXIDE-SIMETH 200-200-20 MG/5 ML NICU TOPICAL: 1.0000 | TOPICAL | Status: DC | PRN

## 2022-12-04 MED ORDER — ALUM & MAG HYDROXIDE-SIMETH 200-200-20 MG/5ML PO SUSP
30.0000 mL | Freq: Four times a day (QID) | ORAL | Status: DC | PRN
  Administered 2022-12-04 – 2022-12-07 (×6): 30 mL via ORAL
  Filled 2022-12-04 (×6): qty 30

## 2022-12-04 NOTE — Assessment & Plan Note (Signed)
LFTs up likely congestive.  CT abdomen on admission normal.  Has hx gastric bypass, might be contributing. - Trend LFTs

## 2022-12-04 NOTE — Progress Notes (Signed)
  Progress Note   Patient: Stephanie Gates JQB:[id number] DOB: 02/26/1966 DOA: 11/30/2022     3 DOS: the patient was seen and examined on 12/04/2022 at 10:17AM      Brief hospital course: Mrs. Chisholm is a 56 y.o. F with morbid obesity s/p gastric bypass and HTN who presented with SOB on exertion and leg swelling.       Assessment and Plan: * Acute systolic CHF (congestive heart failure) (HCC) Net negative 1.2L yesterday 3L on admission.  Cr up to 2 today.  K stable. - Lasix and GDMT (aspirin, atorvastatin, carvedilol, spironolactone) per Cardiology -Daily metabolic panel - Daily weights, strict ins and outs    AKI (acute kidney injury) ([place]) Creatinine up to 2 today. Renal US ruled out obstruction.  Probably this is hemodynamically mediated.  UA normal on admission. - Check FeUrea, urinalysis again, P/C ratio - Hold diuretics and trend Cr - Defer RHC versus PICC and CVPs to Cardiology    Severe tricuspid regurgitation    Severe mitral regurgitation - Consult Cardiology, appreciate cares  Myocardial injury    NSVT (nonsustained ventricular tachycardia) (HCC)    Hypomagnesemia    Hypokalemia - Supplement K  Pre-diabetes Diabetes ruled out.  Essential (primary) hypertension BP soft on new GDMT - Defer antihypertensives to Cardiology  Morbid obesity with BMI of 50.0-59.9, adult (HCC) BMI >50  IDA (iron deficiency anemia) Microcytic anemia noted.  Has chart history IDA.  - Check iron studies  Abdominal symptoms CT on admission was normal.        Subjective: having gas, bloating, nausea. Still swolen, still orthopneic, still dyspneic.  No fever, confusion.     Physical Exam: BP 99/67 (BP Location: Left Arm)   Pulse 82   Temp 97.9 F (36.6 C) (Oral)   Resp 19   Ht '5\' 5"'$  (1.651 m)   Wt (!) 139.5 kg   LMP 02/11/2017   SpO2 96%   BMI 51.18 kg/m   Obese adult female, sitting up in bed, no acute distress RRR, no murmurs heart  sounds are very distant, 2+ peripheral edema Respiratory rate normal, lung sounds diminished due to body habitus Abdomen soft without tenderness palpation or guarding Attention normal, affect normal, judgment and insight appear normal    Data Reviewed: Discussed with cardiology Patient metabolic panel shows creatinine up to 2 LDL 48 TSH normal A1c 6.4% LFTs elevated but stable Hemoglobin 10, microcytic Total bilirubin 2 Troponin 300  Family Communication: Daughter at the bedside    Disposition: Status is: Inpatient         Author: Edwin Dada, MD 12/04/2022 5:15 PM  For on call review [email or web address].

## 2022-12-04 NOTE — Hospital Course (Signed)
Stephanie Gates is a 56 y.o. F with morbid obesity s/p gastric bypass and HTN who presented with SOB on exertion and leg swelling.

## 2022-12-04 NOTE — Assessment & Plan Note (Signed)
-   Consult Cardiology, appreciate cares

## 2022-12-04 NOTE — Assessment & Plan Note (Signed)
BP soft on new GDMT - Defer antihypertensives to Cardiology

## 2022-12-04 NOTE — Assessment & Plan Note (Signed)
BMI 50  

## 2022-12-04 NOTE — Assessment & Plan Note (Signed)
Resolved with supplementation and starting spironolactone. 

## 2022-12-04 NOTE — Progress Notes (Signed)
   Heart Failure Stewardship Pharmacist Progress Note   PCP: Jonetta Osgood, NP PCP-Cardiologist: None    HPI:  56 yo F with PMH of HTN and morbid obesity s/p gastric bypass.   She presented to the ED on 12/23 with shortness of breath, nausea, LE edema, orthopnea, and chest pressure. Treated for pneumonia 2-3 months prior. ECHO 12/25 showed LVEF <20%, global hypokinesis, G2DD, RV mildly reduced. Pending [facility] +/- cardiac MRI this admission once more euvolemic and recovers from AKI.   Current HF Medications: Beta Blocker: carvedilol 3.125 mg BID MRA: spironolactone 25 mg daily SGLT2i: Jardiance 10 mg daily  Prior to admission HF Medications: None  Pertinent Lab Values: Serum creatinine 2.02, BUN 21, Potassium 4.5, Sodium 137, BNP 777.6, Magnesium 2.1, A1c 6.4   Vital Signs: Weight: 307 lbs (admission weight: 308 lbs) Blood pressure: 120/70s  Heart rate: 80s  I/O: net -3.1L  Medication Assistance / Insurance Benefits Check: Does the patient have prescription insurance?  Yes Type of insurance plan: [facility] commercial insurance  Outpatient Pharmacy:  Prior to admission outpatient pharmacy: [facility] Is the patient willing to use [place] at discharge? Yes Is the patient willing to transition their outpatient pharmacy to utilize a [facility] outpatient pharmacy?   Pending    Assessment: 1. Acute systolic CHF (LVEF <67%), pending ischemic evaluation. NYHA class IV symptoms. - Off IV lasix with AKI. Remains volume overloaded. Strict I/Os and daily weights. Keep K>4 and Mag>2. - Continue carvedilol 3.125 mg BID - Consider starting losartan 25 mg daily once renal function stabilizes - Continue spironolactone 25 mg daily - Continue Jardiance 10 mg daily   Plan: 1) Medication changes recommended at this time: - Agree with changes - IV diuresis and GDMT limited by AKI  2) Patient assistance: - Entresto copay $60, copay card lowers to $10 per fill - Jardiance copay  $30, copay card lowers to $10 per month  3)  Education  - To be completed prior to discharge  Kerby Nora, PharmD, BCPS Heart Failure Stewardship Pharmacist Phone [phone]

## 2022-12-04 NOTE — Progress Notes (Signed)
Cardiology Progress Note  Patient ID: Stephanie Gates MRN: [id number] DOB: 02/02/1966 Date of Encounter: 12/04/2022  Primary Cardiologist: None  Subjective   Chief Complaint: SOB  HPI: Still volume overloaded.  Serum creatinine is rising.  ROS:  All other ROS reviewed and negative. Pertinent positives noted in the HPI.     Inpatient Medications  Scheduled Meds:  aspirin  81 mg Oral Daily   atorvastatin  40 mg Oral Daily   carvedilol  3.125 mg Oral BID WC   docusate sodium  100 mg Oral BID   empagliflozin  10 mg Oral Daily   multivitamin with minerals  1 tablet Oral Daily   pantoprazole  40 mg Oral Daily   Ensure Max Protein  11 oz Oral BID   sodium chloride flush  3 mL Intravenous Q12H   spironolactone  25 mg Oral Daily   Continuous Infusions:  PRN Meds: acetaminophen **OR** acetaminophen, albuterol, bisacodyl, guaiFENesin-dextromethorphan, hydrALAZINE, nitroGLYCERIN, ondansetron **OR** ondansetron (ZOFRAN) IV, oxyCODONE, polyethylene glycol, traZODone   Vital Signs   Vitals:   12/04/22 0007 12/04/22 0453 12/04/22 0533 12/04/22 0535  BP: 124/77 122/76  123/77  Pulse: 82 81  83  Resp: 20 (!) 30 20 (!) 21  Temp: (!) 97.5 F (36.4 C) 97.8 F (36.6 C)  (!) 96.9 F (36.1 C)  TempSrc: Oral Oral  Axillary  SpO2: 99% 100%  97%  Weight:  (!) 139.5 kg    Height:        Intake/Output Summary (Last 24 hours) at 12/04/2022 0903 Last data filed at 12/04/2022 0532 Gross per 24 hour  Intake 120 ml  Output 1400 ml  Net -1280 ml      12/04/2022    4:53 AM 12/03/2022    5:00 AM 12/02/2022    5:00 AM  Last 3 Weights  Weight (lbs) 307 lb 8.7 oz 307 lb 14.4 oz 306 lb 14.1 oz  Weight (kg) 139.5 kg 139.663 kg 139.2 kg      Telemetry  Overnight telemetry shows sinus rhythm in the 90s, which I personally reviewed.    Physical Exam   Vitals:   12/04/22 0007 12/04/22 0453 12/04/22 0533 12/04/22 0535  BP: 124/77 122/76  123/77  Pulse: 82 81  83  Resp: 20 (!) 30  20 (!) 21  Temp: (!) 97.5 F (36.4 C) 97.8 F (36.6 C)  (!) 96.9 F (36.1 C)  TempSrc: Oral Oral  Axillary  SpO2: 99% 100%  97%  Weight:  (!) 139.5 kg    Height:        Intake/Output Summary (Last 24 hours) at 12/04/2022 0903 Last data filed at 12/04/2022 0532 Gross per 24 hour  Intake 120 ml  Output 1400 ml  Net -1280 ml       12/04/2022    4:53 AM 12/03/2022    5:00 AM 12/02/2022    5:00 AM  Last 3 Weights  Weight (lbs) 307 lb 8.7 oz 307 lb 14.4 oz 306 lb 14.1 oz  Weight (kg) 139.5 kg 139.663 kg 139.2 kg    Body mass index is 51.18 kg/m.  General: Well nourished, well developed, in no acute distress Head: Atraumatic, normal size  Eyes: PEERLA, EOMI  Neck: Supple, JVD 12 to 15 cm of water Endocrine: No thryomegaly Cardiac: Normal S1, S2; RRR; no murmurs, rubs, or gallops Lungs: Clear to auscultation bilaterally, no wheezing, rhonchi or rales  Abd: Soft, nontender, no hepatomegaly  Ext: 2+ pitting edema up to the knees Musculoskeletal: No  deformities, BUE and BLE strength normal and equal Skin: Warm and dry, no rashes   Neuro: Alert and oriented to person, place, time, and situation, CNII-XII grossly intact, no focal deficits  Psych: Normal mood and affect   Labs  High Sensitivity Troponin:   Recent Labs  Lab 11/30/22 2120 11/30/22 2357 12/01/22 0904 12/02/22 2232 12/03/22 0027  TROPONINIHS 895* 917* 720* 397* 382*     Cardiac EnzymesNo results for input(s): "TROPONINI" in the last 168 hours. No results for input(s): "TROPIPOC" in the last 168 hours.  Chemistry Recent Labs  Lab 11/30/22 1926 12/01/22 1519 12/02/22 1440 12/03/22 0137 12/04/22 0205  NA 138   < > 138 138 137  K 2.6*   < > 3.5 5.0 4.5  CL 100   < > 104 105 100  CO2 29   < > _0 GLUCOSE 117*   < > 115* 125* 113*  BUN 18   < > 15 17 21*  CREATININE 1.15*   < > 1.18* 1.48* 2.02*  CALCIUM 8.8*   < > 9.2 9.3 9.6  PROT 6.9  --   --  6.9 6.7  ALBUMIN 3.3*  --   --  3.4* 3.4*  AST 38   --   --  42* 45*  ALT 48*  --   --  47* 55*  ALKPHOS 87  --   --  79 110  BILITOT 1.5*  --   --  2.3* 2.0*  GFRNONAA 56*   < > 54* 41* 28*  ANIONGAP 9   < > _1 < > = values in this interval not displayed.    Hematology Recent Labs  Lab 12/02/22 0103 12/03/22 0137 12/04/22 0205  WBC 11.2* 11.7* 11.2*  RBC 4.92 4.82 4.78  HGB 10.9* 10.4* 10.5*  HCT 34.9* 34.9* 34.0*  MCV 70.9* 72.4* 71.1*  MCH 22.2* 21.6* 22.0*  MCHC 31.2 29.8* 30.9  RDW 17.8* 18.0* 17.7*  PLT 373 332 338   BNP Recent Labs  Lab 11/30/22 2119  BNP 777.6*    DDimer No results for input(s): "DDIMER" in the last 168 hours.   Radiology  US RENAL  Result Date: 12/04/2022 CLINICAL DATA:  Acute kidney injury. EXAM: RENAL / URINARY TRACT ULTRASOUND COMPLETE COMPARISON:  None Available. FINDINGS: Right Kidney: Renal measurements: 10.7 x 4.7 x 3.6 cm = volume: 95 mL. Mildly increased echogenicity of renal parenchyma is noted. No mass or hydronephrosis visualized. Left Kidney: Renal measurements: 11.0 x 4.8 x 4.6 cm = volume: 126 mL. Echogenicity within normal limits. No mass or hydronephrosis visualized. Bladder: Not well visualized as it is completely decompressed. Other: None. IMPRESSION: No hydronephrosis or renal obstruction is noted. Mildly increased echogenicity of parenchyma of right kidney is noted suggesting possible medical renal disease, although potentially may represent artifact. Electronically Signed   By: Marijo Conception M.D.   On: 12/04/2022 08:27   ECHOCARDIOGRAM COMPLETE  Result Date: 12/02/2022    ECHOCARDIOGRAM REPORT   Patient Name:   Stephanie Gates [facility] Date of Exam: 12/02/2022 Medical Rec #:  [id number]            Height:       65.0 in Accession #:    [id number]           Weight:       306.9 lb Date of Birth:  1966/10/23            BSA:  2.373 m Patient Age:    56 years             BP:           122/103 mmHg Patient Gender: F                    HR:           99 bpm. Exam Location:   Inpatient Procedure: 2D Echo, Cardiac Doppler, Color Doppler and Intracardiac            Opacification Agent                                 MODIFIED REPORT:   This report was modified by Skeet Latch MD on 12/02/2022 due to mitral                                  regurgitation.  Indications:     CHF-Acute Systolic 573.22 / G25.42  History:         Patient has no prior history of Echocardiogram examinations.                  Acute systolic heart failure, Arrythmias:Frequent                  PVCs/nonsustained ventricular tachycardia; Risk                  Factors:Hypertension. Elevated troponin, demand ischemia versus                  non-STEMI.  Sonographer:     Darlina Sicilian RDCS Referring Phys:  [place] Diagnosing Phys: Skeet Latch MD IMPRESSIONS  1. Left ventricular ejection fraction, by estimation, is <20%. The left ventricle has severely decreased function. The left ventricle demonstrates global hypokinesis. The left ventricular internal cavity size was severely dilated. Left ventricular diastolic parameters are consistent with Grade II diastolic dysfunction (pseudonormalization). Elevated left ventricular end-diastolic pressure.  2. Right ventricular systolic function is mildly reduced. The right ventricular size is normal. There is normal pulmonary artery systolic pressure.  3. Left atrial size was mildly dilated.  4. Right atrial size was mildly dilated.  5. Functional mitral regurgitation due to left ventricular disfunction and annular dilatation. The mitral valve is degenerative. Moderate to severe mitral valve regurgitation. No evidence of mitral stenosis.  6. Tricuspid valve regurgitation is severe.  7. The aortic valve is tricuspid. Aortic valve regurgitation is trivial. No aortic stenosis is present.  8. Pulmonic valve regurgitation is moderate.  9. The inferior vena cava is normal in size with greater than 50% respiratory variability, suggesting right atrial pressure of 3 mmHg.  FINDINGS  Left Ventricle: Left ventricular ejection fraction, by estimation, is <20%. The left ventricle has severely decreased function. The left ventricle demonstrates global hypokinesis. Definity contrast agent was given IV to delineate the left ventricular endocardial borders. The left ventricular internal cavity size was severely dilated. There is no left ventricular hypertrophy. Left ventricular diastolic parameters are consistent with Grade II diastolic dysfunction (pseudonormalization). Elevated left ventricular end-diastolic pressure. Right Ventricle: The right ventricular size is normal. No increase in right ventricular wall thickness. Right ventricular systolic function is mildly reduced. There is normal pulmonary artery systolic pressure. The tricuspid regurgitant velocity is 2.12 m/s, and with an assumed right atrial pressure of 15 mmHg,  the estimated right ventricular systolic pressure is 41.6 mmHg. Left Atrium: Left atrial size was mildly dilated. Right Atrium: Right atrial size was mildly dilated. Pericardium: Trivial pericardial effusion is present. The pericardial effusion is circumferential. Mitral Valve: Functional mitral regurgitation due to left ventricular disfunction and annular dilatation. The mitral valve is degenerative in appearance. Moderate to severe mitral valve regurgitation. No evidence of mitral valve stenosis. Tricuspid Valve: The tricuspid valve is grossly normal. Tricuspid valve regurgitation is severe. No evidence of tricuspid stenosis. Aortic Valve: The aortic valve is tricuspid. Aortic valve regurgitation is trivial. No aortic stenosis is present. Pulmonic Valve: The pulmonic valve was grossly normal. Pulmonic valve regurgitation is moderate. No evidence of pulmonic stenosis. Aorta: The aortic root is normal in size and structure. Venous: The inferior vena cava is normal in size with greater than 50% respiratory variability, suggesting right atrial pressure of 3 mmHg.  IAS/Shunts: No atrial level shunt detected by color flow Doppler.  LEFT VENTRICLE PLAX 2D LVIDd:         7.30 cm      Diastology LVIDs:         6.10 cm      LV e' medial:    3.98 cm/s LV PW:         0.70 cm      LV E/e' medial:  20.7 LV IVS:        0.80 cm      LV e' lateral:   8.25 cm/s LVOT diam:     2.00 cm      LV E/e' lateral: 10.0 LV SV:         30 LV SV Index:   13 LVOT Area:     3.14 cm  LV Volumes (MOD) LV vol d, MOD A2C: 236.0 ml LV vol d, MOD A4C: 212.0 ml LV vol s, MOD A2C: 184.0 ml LV vol s, MOD A4C: 164.0 ml LV SV MOD A2C:     52.0 ml LV SV MOD A4C:     212.0 ml LV SV MOD BP:      46.2 ml RIGHT VENTRICLE RV S prime:     8.85 cm/s TAPSE (M-mode): 1.2 cm LEFT ATRIUM             Index        RIGHT ATRIUM           Index LA diam:        4.10 cm 1.73 cm/m   RA Area:     22.10 cm LA Vol (A2C):   81.3 ml 34.26 ml/m  RA Volume:   68.40 ml  28.83 ml/m LA Vol (A4C):   79.4 ml 33.46 ml/m LA Biplane Vol: 81.0 ml 34.14 ml/m  AORTIC VALVE             PULMONIC VALVE LVOT Vmax:   86.90 cm/s  PR End Diast Vel: 8.41 msec LVOT Vmean:  47.600 cm/s LVOT VTI:    0.097 m  AORTA Ao Root diam: 2.70 cm Ao Asc diam:  2.50 cm MITRAL VALVE                  TRICUSPID VALVE MV Area (PHT): 3.22 cm       TR Peak grad:   18.0 mmHg MV Decel Time: 236 msec       TR Vmax:        212.00 cm/s MR Peak grad:    74.0 mmHg MR Mean grad:    48.0 mmHg  SHUNTS MR Vmax:         430.00 cm/s  Systemic VTI:  0.10 m MR Vmean:        329.0 cm/s   Systemic Diam: 2.00 cm MR PISA:         1.57 cm MR PISA Eff ROA: 14 mm MR PISA Radius:  0.50 cm MV E velocity: 82.30 cm/s Skeet Latch MD Electronically signed by Skeet Latch MD Signature Date/Time: 12/02/2022/12:13:17 PM    Final (Updated)     Cardiac Studies  TTE 12/02/2022   1. Left ventricular ejection fraction, by estimation, is <20%. The left  ventricle has severely decreased function. The left ventricle demonstrates  global hypokinesis. The left ventricular internal cavity  size was severely  dilated. Left ventricular  diastolic parameters are consistent with Grade II diastolic dysfunction  (pseudonormalization). Elevated left ventricular end-diastolic pressure.   2. Right ventricular systolic function is mildly reduced. The right  ventricular size is normal. There is normal pulmonary artery systolic  pressure.   3. Left atrial size was mildly dilated.   4. Right atrial size was mildly dilated.   5. Functional mitral regurgitation due to left ventricular disfunction  and annular dilatation. The mitral valve is degenerative. Moderate to  severe mitral valve regurgitation. No evidence of mitral stenosis.   6. Tricuspid valve regurgitation is severe.   7. The aortic valve is tricuspid. Aortic valve regurgitation is trivial.  No aortic stenosis is present.   8. Pulmonic valve regurgitation is moderate.   9. The inferior vena cava is normal in size with greater than 50%  respiratory variability, suggesting right atrial pressure of 3 mmHg.  Patient Profile  Rovena Hearld is a 56 y.o. female with hypertension, obesity who was admitted on 12/01/2022 for new onset systolic heart failure.   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  Now has significant rise in serum creatinine to 2.02.  We will hold diuresis today.  Give her a Lasix holiday.  Renal ultrasound without obstruction, questionable medical renal disease. -Continue Coreg 3.25 mg twice daily.  Tolerating this well.  BP stable.  No signs of shock. -Continue Aldactone 25 mg daily. -Continue Jardiance 10 mg daily. -Hold on ACE/ARB/ARNI given AKI.  If creatinine continues to rise may need nephrology input.  Overall suspect this is due to contrast from CT she received is well as slightly cardiorenal related. -HIV negative.  A1c 6.4.  She does  have a left bundle branch block with significant dyssynchrony on echo.  Suspect her cardiomyopathy could be a combination of hypertension and left bundle branch block. -She will need left and right heart catheterization.  Currently with rising serum creatinine this is a hold.  # AKI -Did undergo contrast CT scan on admission.  Has received diuretic therapy.  Also with severely reduced EF.  Suspect this is multifactorial.  Lasix holiday today.  # Elevated troponin, demand ischemia versus non-STEMI -No angina.  CRP is low.  ESR normal.  Do not suspect myocarditis. -Completed 48 hours of heparin -Continue aspirin and statin. -She is on a beta-blocker. -She will need left heart catheterization this admission but due to AKI this is on hold.  Also grossly volume overloaded.    For questions or updates, please contact [place] Please consult [email or web address] for contact info under   Signed, [place] T. Audie Box, MD, [facility]  [place]  12/04/2022 9:03 AM

## 2022-12-04 NOTE — Assessment & Plan Note (Signed)
Net negative 1.2L yesterday 3L on admission.  Cr up to 2 today.  K stable. - Lasix and GDMT (aspirin, atorvastatin, carvedilol, spironolactone) per Cardiology -Daily metabolic panel - Daily weights, strict ins and outs

## 2022-12-04 NOTE — Assessment & Plan Note (Signed)
Diabetes ruled out.

## 2022-12-04 NOTE — Assessment & Plan Note (Signed)
Creatinine up to 2 today. Renal US ruled out obstruction.  Probably this is hemodynamically mediated.  UA normal on admission. - Check FeUrea, urinalysis again, P/C ratio - Hold diuretics and trend Cr - Defer RHC versus PICC and CVPs to Cardiology

## 2022-12-04 NOTE — Progress Notes (Signed)
Tele. Alarm V-fib/v-tach. Patient sitting on side of 2 tele. Wirers off. Moving around in bed. Patient voiced no complaints.

## 2022-12-04 NOTE — Progress Notes (Signed)
Patient had short run v-tach. Resting in bed with no complaints. Reden R.N. into assess. Patient.

## 2022-12-04 NOTE — Assessment & Plan Note (Signed)
Microcytic anemia noted.  Has chart history IDA.  - Check iron studies

## 2022-12-04 NOTE — Progress Notes (Signed)
Mobility Specialist Progress Note   12/04/22 1415  Mobility  Activity Ambulated with assistance in hallway;Dangled on edge of bed  Level of Assistance Standby assist, set-up cues, supervision of patient - no hands on  Assistive Device Front wheel walker  Distance Ambulated (ft) 60 ft  Range of Motion/Exercises Active;All extremities  Activity Response Tolerated well   Pre Mobility:  HR 86, SpO2 100% RA During Mobility: HR 93, SpO2 99% RA Post Mobility: HR 85  Patient received in supine and agreeable to participate in mobility. Ambulated supervision level with slow steady gait. Required standing rest break x1 secondary to fatigue. Distance limited secondary fatigue and SOB. Returned to room without complaint or incident. Was left dangling with all needs met, call bell in reach.   Jordan Tripp, BS EXP Mobility Specialist Please contact via SecureChat or Rehab office at [phone]  

## 2022-12-05 ENCOUNTER — Encounter (HOSPITAL_COMMUNITY): Payer: Self-pay | Admitting: Internal Medicine

## 2022-12-05 DIAGNOSIS — I10 Essential (primary) hypertension: Secondary | ICD-10-CM | POA: Diagnosis not present

## 2022-12-05 DIAGNOSIS — N179 Acute kidney failure, unspecified: Secondary | ICD-10-CM | POA: Diagnosis not present

## 2022-12-05 DIAGNOSIS — I5021 Acute systolic (congestive) heart failure: Secondary | ICD-10-CM | POA: Diagnosis not present

## 2022-12-05 DIAGNOSIS — I5A Non-ischemic myocardial injury (non-traumatic): Secondary | ICD-10-CM | POA: Diagnosis not present

## 2022-12-05 LAB — CBC WITH DIFFERENTIAL/PLATELET
Abs Immature Granulocytes: 0.06 10*3/uL (ref 0.00–0.07)
Basophils Absolute: 0.1 10*3/uL (ref 0.0–0.1)
Basophils Relative: 0 %
Eosinophils Absolute: 0.1 10*3/uL (ref 0.0–0.5)
Eosinophils Relative: 1 %
HCT: 34.6 % — ABNORMAL LOW (ref 36.0–46.0)
Hemoglobin: 10.4 g/dL — ABNORMAL LOW (ref 12.0–15.0)
Immature Granulocytes: 1 %
Lymphocytes Relative: 15 %
Lymphs Abs: 1.9 10*3/uL (ref 0.7–4.0)
MCH: 21.5 pg — ABNORMAL LOW (ref 26.0–34.0)
MCHC: 30.1 g/dL (ref 30.0–36.0)
MCV: 71.6 fL — ABNORMAL LOW (ref 80.0–100.0)
Monocytes Absolute: 1.6 10*3/uL — ABNORMAL HIGH (ref 0.1–1.0)
Monocytes Relative: 13 %
Neutro Abs: 8.7 10*3/uL — ABNORMAL HIGH (ref 1.7–7.7)
Neutrophils Relative %: 70 %
Platelets: 295 10*3/uL (ref 150–400)
RBC: 4.83 MIL/uL (ref 3.87–5.11)
RDW: 17.6 % — ABNORMAL HIGH (ref 11.5–15.5)
WBC: 12.4 10*3/uL — ABNORMAL HIGH (ref 4.0–10.5)
nRBC: 0 % (ref 0.0–0.2)

## 2022-12-05 LAB — COMPREHENSIVE METABOLIC PANEL
ALT: 64 U/L — ABNORMAL HIGH (ref 0–44)
AST: 53 U/L — ABNORMAL HIGH (ref 15–41)
Albumin: 3.3 g/dL — ABNORMAL LOW (ref 3.5–5.0)
Alkaline Phosphatase: 154 U/L — ABNORMAL HIGH (ref 38–126)
Anion gap: 10 (ref 5–15)
BUN: 26 mg/dL — ABNORMAL HIGH (ref 6–20)
CO2: 26 mmol/L (ref 22–32)
Calcium: 9.7 mg/dL (ref 8.9–10.3)
Chloride: 100 mmol/L (ref 98–111)
Creatinine, Ser: 2.1 mg/dL — ABNORMAL HIGH (ref 0.44–1.00)
GFR, Estimated: 27 mL/min — ABNORMAL LOW (ref >60)
Glucose, Bld: 107 mg/dL — ABNORMAL HIGH (ref 70–99)
Potassium: 4.6 mmol/L (ref 3.5–5.1)
Sodium: 136 mmol/L (ref 135–145)
Total Bilirubin: 1.9 mg/dL — ABNORMAL HIGH (ref 0.3–1.2)
Total Protein: 6.4 g/dL — ABNORMAL LOW (ref 6.5–8.1)

## 2022-12-05 LAB — IRON AND TIBC
Iron: 13 ug/dL — ABNORMAL LOW (ref 28–170)
Saturation Ratios: 2 % — ABNORMAL LOW (ref 10.4–31.8)
TIBC: 578 ug/dL — ABNORMAL HIGH (ref 250–450)
UIBC: 565 ug/dL

## 2022-12-05 LAB — MAGNESIUM: Magnesium: 2.1 mg/dL (ref 1.7–2.4)

## 2022-12-05 LAB — FERRITIN: Ferritin: 11 ng/mL (ref 11–307)

## 2022-12-05 MED ORDER — SODIUM CHLORIDE 0.9% FLUSH
3.0000 mL | Freq: Two times a day (BID) | INTRAVENOUS | Status: DC
  Administered 2022-12-05 – 2022-12-11 (×5): 3 mL via INTRAVENOUS

## 2022-12-05 MED ORDER — HEPARIN SODIUM (PORCINE) 5000 UNIT/ML IJ SOLN
5000.0000 [IU] | Freq: Three times a day (TID) | INTRAMUSCULAR | Status: DC
  Administered 2022-12-05 – 2022-12-06 (×3): 5000 [IU] via SUBCUTANEOUS
  Filled 2022-12-05 (×3): qty 1

## 2022-12-05 MED ORDER — SODIUM CHLORIDE 0.9 % IV SOLN
125.0000 mg | Freq: Every day | INTRAVENOUS | Status: AC
  Administered 2022-12-05 – 2022-12-08 (×4): 125 mg via INTRAVENOUS
  Filled 2022-12-05 (×4): qty 10

## 2022-12-05 MED ORDER — FUROSEMIDE 10 MG/ML IJ SOLN
120.0000 mg | Freq: Two times a day (BID) | INTRAVENOUS | Status: DC
  Administered 2022-12-05 – 2022-12-10 (×12): 120 mg via INTRAVENOUS
  Filled 2022-12-05 (×2): qty 10
  Filled 2022-12-05: qty 12
  Filled 2022-12-05: qty 10
  Filled 2022-12-05: qty 2
  Filled 2022-12-05: qty 10
  Filled 2022-12-05: qty 12
  Filled 2022-12-05: qty 10
  Filled 2022-12-05: qty 12
  Filled 2022-12-05 (×4): qty 10
  Filled 2022-12-05: qty 12
  Filled 2022-12-05 (×2): qty 10

## 2022-12-05 NOTE — Progress Notes (Signed)
Physical Therapy Treatment Patient Details Name: Stephanie Gates MRN: [id number] DOB: [birth year] Today's Date: 12/05/2022   History of Present Illness Pt is a 56 y.o. F admitted 11/30/22 with SOB, BLE edema, emesis. Workup for new onset CHF. PMH includes HTN, HLD, DM, obesity.    PT Comments    Pt seen for PT tx with pt agreeable. PT educates pt on potential UE restrictions following cardiac cath tomorrow & pt voices understanding but would benefit from further education. Pt elects to hold to brother's hand during STS but is able to ambulate in hallway without AD with CGA. Pt requires ~3 standing rest breaks 2/2 fatigue. Will continue to follow pt acutely to address balance, endurance, and gait.    Recommendations for follow up therapy are one component of a multi-disciplinary discharge planning process, led by the attending physician.  Recommendations may be updated based on patient status, additional functional criteria and insurance authorization.  Follow Up Recommendations  No PT follow up     Assistance Recommended at Discharge PRN  Patient can return home with the following Assistance with cooking/housework;Assist for transportation   Equipment Recommendations  None recommended by PT    Recommendations for Other Services       Precautions / Restrictions Precautions Precautions: Fall Precaution Comments: watch HR Restrictions Weight Bearing Restrictions: No     Mobility  Bed Mobility               General bed mobility comments: not tested, pt received & left sitting EOB    Transfers Overall transfer level: Needs assistance   Transfers: Sit to/from Stand Sit to Stand: Min assist           General transfer comment: pt elects to hold to brother's hands with BUE for STS from EOB    Ambulation/Gait Ambulation/Gait assistance: Min guard, Supervision Gait Distance (Feet): 100 Feet Assistive device: None Gait Pattern/deviations: Step-through pattern,  Decreased stride length, Decreased dorsiflexion - right Gait velocity: decreased     General Gait Details: Pt requires ~3 standing rest breaks 2/2 overall fatigue. Pt with narrow BOS, as pt fatigues pt demonstrates R hip drop & endorses hx of R hip pain, as well as somewhat R foot inversion.   Stairs             Wheelchair Mobility    Modified Rankin (Stroke Patients Only)       Balance Overall balance assessment: Needs assistance Sitting-balance support: No upper extremity supported, Feet supported Sitting balance-Leahy Scale: Good     Standing balance support: No upper extremity supported, During functional activity Standing balance-Leahy Scale: Fair                              Cognition Arousal/Alertness: Awake/alert Behavior During Therapy: WFL for tasks assessed/performed Overall Cognitive Status: Within Functional Limits for tasks assessed                                          Exercises      General Comments General comments (skin integrity, edema, etc.): Pt on room air throughout session with SpO2 >90%, HR 93-94 bpm.      Pertinent Vitals/Pain Pain Assessment Pain Assessment: No/denies pain    Home Living  Prior Function            PT Goals (current goals can now be found in the care plan section) Acute Rehab PT Goals Patient Stated Goal: return home, less fatigue PT Goal Formulation: With patient Time For Goal Achievement: 12/16/22 Potential to Achieve Goals: Good Progress towards PT goals: Progressing toward goals    Frequency    Min 2X/week      PT Plan Current plan remains appropriate    Co-evaluation              AM-PAC PT "6 Clicks" Mobility   Outcome Measure  Help needed turning from your back to your side while in a flat bed without using bedrails?: None Help needed moving from lying on your back to sitting on the side of a flat bed without using  bedrails?: None Help needed moving to and from a bed to a chair (including a wheelchair)?: None Help needed standing up from a chair using your arms (e.g., wheelchair or bedside chair)?: A Little Help needed to walk in hospital room?: A Little Help needed climbing 3-5 steps with a railing? : A Little 6 Click Score: 21    End of Session   Activity Tolerance: Patient tolerated treatment well Patient left: in bed;with call bell/phone within reach;with family/visitor present Nurse Communication: Mobility status PT Visit Diagnosis: Other abnormalities of gait and mobility (R26.89);Muscle weakness (generalized) (M62.81)     Time: 8676-1950 PT Time Calculation (min) (ACUTE ONLY): 14 min  Charges:  $Therapeutic Activity: 8-22 mins                     Stephanie Gates, PT, DPT 12/05/22, 3:14 PM   Stephanie Gates 12/05/2022, 3:13 PM

## 2022-12-05 NOTE — Progress Notes (Signed)
Heart Failure Nurse Navigator Progress Note  PCP: Jonetta Osgood, NP PCP-Cardiologist: None Admission Diagnosis: Hypervolemia, Pleural effusion, Peripheral edema, chest pain, shortness of breath Admitted from: Home  Presentation:   Stephanie Gates presented with shortness of breath, nausea, LE edema, and chest pressure. Patient reported that she had pneumonia a couple months ago and has just stayed tired since. BP 142/78, HR 102, BMI 51, BNP 777.6, Troponin 895, EKG showed sinus tachycardia with PVC. IV lasix given.   Patient was educated on the sign and symptoms of heart failure, daily weights, when to call her doctor or go to the ED, diet/ fluid restrictions, taking all medications as prescribed and atttending all medical appointments. Patient verbalized her understanding, A HF TOC appointment was scheduled for 12/20/22   ECHO/ LVEF: <20% new onset  Clinical Course:  Past Medical History:  Diagnosis Date   Allergy    seasonal   Anemia    Arthritis    right hip   Gastritis    Headache    sinus/allergies   Hypertension    Seasonal allergies    Vertigo    1-2x/month   Wears contact lenses      Social History   Socioeconomic History   Marital status: Single    Spouse name: Not on file   Number of children: 1   Years of education: Not on file   Highest education level: Bachelor's degree (e.g., BA, AB, BS)  Occupational History   Occupation: Manufacturing engineer  Tobacco Use   Smoking status: Never   Smokeless tobacco: Never  Vaping Use   Vaping Use: Never used  Substance and Sexual Activity   Alcohol use: No   Drug use: No   Sexual activity: Not Currently  Other Topics Concern   Not on file  Social History Narrative   Not on file   Social Determinants of Health   Financial Resource Strain: Low Risk  (12/05/2022)   Overall Financial Resource Strain (CARDIA)    Difficulty of Paying Living Expenses: Not very hard  Food Insecurity: Not on file   Transportation Needs: No Transportation Needs (12/05/2022)   PRAPARE - Hydrologist (Medical): No    Lack of Transportation (Non-Medical): No  Physical Activity: Not on file  Stress: Not on file  Social Connections: Not on file   Education Assessment and Provision:  Detailed education and instructions provided on heart failure disease management including the following:  Signs and symptoms of Heart Failure When to call the physician Importance of daily weights Low sodium diet Fluid restriction Medication management Anticipated future follow-up appointments  Patient education given on each of the above topics.  Patient acknowledges understanding via teach back method and acceptance of all instructions.  Education Materials:  "Living Better With Heart Failure" Booklet, HF zone tool, & Daily Weight Tracker Tool.  Patient has scale at home: yes Patient has pill box at home: NA    High Risk Criteria for Readmission and/or Poor Patient Outcomes: Heart failure hospital admissions (last 6 months): 1  No Show rate: 14% Difficult social situation: No Demonstrates medication adherence: Yes Primary Language: English Literacy level: Reading, writing, and comprehension  Barriers of Care:   New Hf education continued Diet/ fluid/ daily weights  Considerations/Referrals:   Referral made to Heart Failure Pharmacist Stewardship: Yes Referral made to Heart Failure CSW/NCM TOC: No Referral made to Heart & Vascular TOC clinic: Yes , 12/20/22  Items for Follow-up on DC/TOC: Continued HF education  Diet/ fluid/ daily weights   Earnestine Leys, BSN, RN Heart Failure Leisure centre manager Chat Only

## 2022-12-05 NOTE — Progress Notes (Signed)
   Heart Failure Stewardship Pharmacist Progress Note   PCP: Jonetta Osgood, NP PCP-Cardiologist: None    HPI:  56 yo F with PMH of HTN and morbid obesity s/p gastric bypass.   She presented to the ED on 12/23 with shortness of breath, nausea, LE edema, orthopnea, and chest pressure. Treated for pneumonia 2-3 months prior. ECHO 12/25 showed LVEF <20%, global hypokinesis, G2DD, RV mildly reduced. Pending [facility] +/- cardiac MRI this admission once more euvolemic and recovered from AKI.   Current HF Medications: Diuretic: furosemide 120 mg IV BID Beta Blocker: carvedilol 3.125 mg BID MRA: spironolactone 25 mg daily SGLT2i: Jardiance 10 mg daily Other: Ferrlecit 125 mg IV daily  Prior to admission HF Medications: None  Pertinent Lab Values: Serum creatinine 2.10, BUN 26, Potassium 4.6, Sodium 136, BNP 777.6, Magnesium 2.1, A1c 6.4   Vital Signs: Weight: 308 lbs (admission weight: 308 lbs) Blood pressure: 120/70s  Heart rate: 70-80s  I/O: net -2.3L  Medication Assistance / Insurance Benefits Check: Does the patient have prescription insurance?  Yes Type of insurance plan: [employer] commercial insurance  Outpatient Pharmacy:  Prior to admission outpatient pharmacy: [facility] Is the patient willing to use [place] at discharge? Yes Is the patient willing to transition their outpatient pharmacy to utilize a [facility] outpatient pharmacy?   Pending    Assessment: 1. Acute systolic CHF (LVEF <23%), pending ischemic evaluation. NYHA class IV symptoms. - Agree with restarting furosemide 120 mg IV BID. Strict I/Os and daily weights. Keep K>4 and Mag>2. - Continue carvedilol 3.125 mg BID - Consider starting losartan 25 mg daily vs Entresto once renal function stabilizes - Continue spironolactone 25 mg daily - Continue Jardiance 10 mg daily   Plan: 1) Medication changes recommended at this time: - Agree with continuing IV diuresis   2) Patient assistance: - Entresto copay  $60, copay card lowers to $10 per fill - Jardiance copay $30, copay card lowers to $10 per month  3)  Education  - Patient has been educated on current HF medications and potential additions to HF medication regimen - Patient verbalizes understanding that over the next few months, these medication doses may change and more medications may be added to optimize HF regimen - Patient has been educated on basic disease state pathophysiology and goals of therapy   Kerby Nora, PharmD, BCPS Heart Failure Stewardship Pharmacist Phone [phone]

## 2022-12-05 NOTE — Progress Notes (Signed)
Cardiology Progress Note  Patient ID: Stephanie Gates MRN: [id number] DOB: 1966-02-28 Date of Encounter: 12/05/2022  Primary Cardiologist: None  Subjective   Chief Complaint: SOB  HPI: Kidney function likely has plateaued.  Restart diuretics today.  ROS:  All other ROS reviewed and negative. Pertinent positives noted in the HPI.     Inpatient Medications  Scheduled Meds:  aspirin  81 mg Oral Daily   atorvastatin  40 mg Oral Daily   carvedilol  3.125 mg Oral BID WC   docusate sodium  100 mg Oral BID   empagliflozin  10 mg Oral Daily   multivitamin with minerals  1 tablet Oral Daily   pantoprazole  40 mg Oral Daily   Ensure Max Protein  11 oz Oral BID   sodium chloride flush  3 mL Intravenous Q12H   spironolactone  25 mg Oral Daily   Continuous Infusions:  ferric gluconate (FERRLECIT) IVPB     furosemide     PRN Meds: acetaminophen **OR** acetaminophen, albuterol, alum & mag hydroxide-simeth, bisacodyl, guaiFENesin-dextromethorphan, hydrALAZINE, nitroGLYCERIN, ondansetron **OR** ondansetron (ZOFRAN) IV, oxyCODONE, polyethylene glycol, traZODone   Vital Signs   Vitals:   12/04/22 2027 12/04/22 2349 12/05/22 0507 12/05/22 0818  BP: 118/79 119/76 116/66 126/73  Pulse: 78 91 79 83  Resp: 19 (!) 24 (!) 23 20  Temp: 97.7 F (36.5 C) (!) 97.2 F (36.2 C) 97.7 F (36.5 C) 98.1 F (36.7 C)  TempSrc: Oral Oral Oral Oral  SpO2: 99% 100% 100% 99%  Weight:   (!) 140 kg   Height:        Intake/Output Summary (Last 24 hours) at 12/05/2022 1010 Last data filed at 12/05/2022 0913 Gross per 24 hour  Intake 960 ml  Output 400 ml  Net 560 ml      12/05/2022    5:07 AM 12/04/2022    4:53 AM 12/03/2022    5:00 AM  Last 3 Weights  Weight (lbs) 308 lb 10.3 oz 307 lb 8.7 oz 307 lb 14.4 oz  Weight (kg) 140 kg 139.5 kg 139.663 kg      Telemetry  Overnight telemetry shows SR 80s, which I personally reviewed.    Physical Exam   Vitals:   12/04/22 2027 12/04/22  2349 12/05/22 0507 12/05/22 0818  BP: 118/79 119/76 116/66 126/73  Pulse: 78 91 79 83  Resp: 19 (!) 24 (!) 23 20  Temp: 97.7 F (36.5 C) (!) 97.2 F (36.2 C) 97.7 F (36.5 C) 98.1 F (36.7 C)  TempSrc: Oral Oral Oral Oral  SpO2: 99% 100% 100% 99%  Weight:   (!) 140 kg   Height:        Intake/Output Summary (Last 24 hours) at 12/05/2022 1010 Last data filed at 12/05/2022 0913 Gross per 24 hour  Intake 960 ml  Output 400 ml  Net 560 ml       12/05/2022    5:07 AM 12/04/2022    4:53 AM 12/03/2022    5:00 AM  Last 3 Weights  Weight (lbs) 308 lb 10.3 oz 307 lb 8.7 oz 307 lb 14.4 oz  Weight (kg) 140 kg 139.5 kg 139.663 kg    Body mass index is 51.36 kg/m.  General: Well nourished, well developed, in no acute distress Head: Atraumatic, normal size  Eyes: PEERLA, EOMI  Neck: Supple, JVD 12 to 15 cm of water Endocrine: No thryomegaly Cardiac: Normal S1, S2; RRR; no murmurs, rubs, or gallops Lungs: Rales at the lung Abd: Soft, nontender,  no hepatomegaly  Ext: 2+ pitting edema Musculoskeletal: No deformities, BUE and BLE strength normal and equal Skin: Warm and dry, no rashes   Neuro: Alert and oriented to person, place, time, and situation, CNII-XII grossly intact, no focal deficits  Psych: Normal mood and affect   Labs  High Sensitivity Troponin:   Recent Labs  Lab 11/30/22 2120 11/30/22 2357 12/01/22 0904 12/02/22 2232 12/03/22 0027  TROPONINIHS 895* 917* 720* 397* 382*     Cardiac EnzymesNo results for input(s): "TROPONINI" in the last 168 hours. No results for input(s): "TROPIPOC" in the last 168 hours.  Chemistry Recent Labs  Lab 12/03/22 0137 12/04/22 0205 12/05/22 0148  NA 138 137 136  K 5.0 4.5 4.6  CL 105 100 100  CO2 '23 27 26  '$ GLUCOSE 125* 113* 107*  BUN 17 21* 26*  CREATININE 1.48* 2.02* 2.10*  CALCIUM 9.3 9.6 9.7  PROT 6.9 6.7 6.4*  ALBUMIN 3.4* 3.4* 3.3*  AST 42* 45* 53*  ALT 47* 55* 64*  ALKPHOS 79 110 154*  BILITOT 2.3* 2.0* 1.9*   GFRNONAA 41* 28* 27*  ANIONGAP '10 10 10    '$ Hematology Recent Labs  Lab 12/03/22 0137 12/04/22 0205 12/05/22 0148  WBC 11.7* 11.2* 12.4*  RBC 4.82 4.78 4.83  HGB 10.4* 10.5* 10.4*  HCT 34.9* 34.0* 34.6*  MCV 72.4* 71.1* 71.6*  MCH 21.6* 22.0* 21.5*  MCHC 29.8* 30.9 30.1  RDW 18.0* 17.7* 17.6*  PLT 332 338 295   BNP Recent Labs  Lab 11/30/22 2119  BNP 777.6*    DDimer No results for input(s): "DDIMER" in the last 168 hours.   Radiology  US RENAL  Result Date: 12/04/2022 CLINICAL DATA:  Acute kidney injury. EXAM: RENAL / URINARY TRACT ULTRASOUND COMPLETE COMPARISON:  None Available. FINDINGS: Right Kidney: Renal measurements: 10.7 x 4.7 x 3.6 cm = volume: 95 mL. Mildly increased echogenicity of renal parenchyma is noted. No mass or hydronephrosis visualized. Left Kidney: Renal measurements: 11.0 x 4.8 x 4.6 cm = volume: 126 mL. Echogenicity within normal limits. No mass or hydronephrosis visualized. Bladder: Not well visualized as it is completely decompressed. Other: None. IMPRESSION: No hydronephrosis or renal obstruction is noted. Mildly increased echogenicity of parenchyma of right kidney is noted suggesting possible medical renal disease, although potentially may represent artifact. Electronically Signed   By: Marijo Conception M.D.   On: 12/04/2022 08:27    Cardiac Studies  TTE 12/02/2022  1. Left ventricular ejection fraction, by estimation, is <20%. The left  ventricle has severely decreased function. The left ventricle demonstrates  global hypokinesis. The left ventricular internal cavity size was severely  dilated. Left ventricular  diastolic parameters are consistent with Grade II diastolic dysfunction  (pseudonormalization). Elevated left ventricular end-diastolic pressure.   2. Right ventricular systolic function is mildly reduced. The right  ventricular size is normal. There is normal pulmonary artery systolic  pressure.   3. Left atrial size was mildly dilated.    4. Right atrial size was mildly dilated.   5. Functional mitral regurgitation due to left ventricular disfunction  and annular dilatation. The mitral valve is degenerative. Moderate to  severe mitral valve regurgitation. No evidence of mitral stenosis.   6. Tricuspid valve regurgitation is severe.   7. The aortic valve is tricuspid. Aortic valve regurgitation is trivial.  No aortic stenosis is present.   8. Pulmonic valve regurgitation is moderate.   9. The inferior vena cava is normal in size with greater than 50%  respiratory variability, suggesting right atrial pressure of 3 mmHg.   Patient Profile  Stephanie Gates is a 56 y.o. female with hypertension, obesity who was admitted on 12/01/2022 for new onset systolic heart failure.   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  I believe her serum creatinine has reached his plateau.  Add back Lasix 120 mg IV twice daily today. -Continue Coreg 3.25 mg twice daily.  Continue Aldactone 25 mg daily.  Continue Jardiance 10 mg daily.  Likely add ARNI if kidney function continues to improve. -Tentative plan for left heart catheterization tomorrow pending kidney function. -HIV negative.  A1c 6.4.  Cardiomyopathy could be related to left bundle branch block in setting of hypertension.  Regardless she needs heart catheterization pending kidney function.  # AKI -Multifactorial in setting of CT scan with contrast -Also with congestive heart failure which can be contributing. -Renal ultrasound negative. -Okay to restart diuretics today.  # Elevated troponin, demand ischemia versus non-STEMI -No angina.  Suspect this is secondary to volume overload.  Plan for left and right heart catheterization this admission.  May be tomorrow. -She has completed 48 hours of heparin.  She is on aspirin and statin and  beta-blocker.  No symptoms of angina.  FEN -DVT Ppx: Subcutaneous heparin -Code: Full -Diet: Heart healthy -No IVF  For questions or updates, please contact [place] Please consult [email or web address] for contact info under     Signed, [place] Bells T. Audie Box, MD, [place]  12/05/2022 10:10 AM

## 2022-12-06 ENCOUNTER — Ambulatory Visit (HOSPITAL_COMMUNITY): Admit: 2022-12-06 | Payer: 59 | Admitting: Cardiology

## 2022-12-06 ENCOUNTER — Encounter (HOSPITAL_COMMUNITY)
Admission: EM | Disposition: A | Discharge status: Home / Self Care | Payer: Self-pay | Source: Home / Self Care | Attending: Cardiovascular Disease

## 2022-12-06 ENCOUNTER — Encounter (HOSPITAL_COMMUNITY): Payer: Self-pay | Admitting: Internal Medicine

## 2022-12-06 ENCOUNTER — Inpatient Hospital Stay: Payer: Self-pay

## 2022-12-06 DIAGNOSIS — N179 Acute kidney failure, unspecified: Secondary | ICD-10-CM | POA: Diagnosis not present

## 2022-12-06 DIAGNOSIS — I5A Non-ischemic myocardial injury (non-traumatic): Secondary | ICD-10-CM | POA: Diagnosis not present

## 2022-12-06 DIAGNOSIS — I5021 Acute systolic (congestive) heart failure: Secondary | ICD-10-CM | POA: Diagnosis not present

## 2022-12-06 DIAGNOSIS — I340 Nonrheumatic mitral (valve) insufficiency: Secondary | ICD-10-CM | POA: Diagnosis not present

## 2022-12-06 HISTORY — PX: RIGHT HEART CATH: CATH118263

## 2022-12-06 LAB — POCT I-STAT EG7
Acid-Base Excess: 4 mmol/L — ABNORMAL HIGH (ref 0.0–2.0)
Acid-Base Excess: 4 mmol/L — ABNORMAL HIGH (ref 0.0–2.0)
Acid-Base Excess: 5 mmol/L — ABNORMAL HIGH (ref 0.0–2.0)
Bicarbonate: 28.8 mmol/L — ABNORMAL HIGH (ref 20.0–28.0)
Bicarbonate: 28.9 mmol/L — ABNORMAL HIGH (ref 20.0–28.0)
Bicarbonate: 29.3 mmol/L — ABNORMAL HIGH (ref 20.0–28.0)
Calcium, Ion: 1.07 mmol/L — ABNORMAL LOW (ref 1.15–1.40)
Calcium, Ion: 1.15 mmol/L (ref 1.15–1.40)
Calcium, Ion: 1.19 mmol/L (ref 1.15–1.40)
HCT: 32 % — ABNORMAL LOW (ref 36.0–46.0)
HCT: 33 % — ABNORMAL LOW (ref 36.0–46.0)
HCT: 33 % — ABNORMAL LOW (ref 36.0–46.0)
Hemoglobin: 10.9 g/dL — ABNORMAL LOW (ref 12.0–15.0)
Hemoglobin: 11.2 g/dL — ABNORMAL LOW (ref 12.0–15.0)
Hemoglobin: 11.2 g/dL — ABNORMAL LOW (ref 12.0–15.0)
O2 Saturation: 63 %
O2 Saturation: 64 %
O2 Saturation: 66 %
Potassium: 3.4 mmol/L — ABNORMAL LOW (ref 3.5–5.1)
Potassium: 3.4 mmol/L — ABNORMAL LOW (ref 3.5–5.1)
Potassium: 3.5 mmol/L (ref 3.5–5.1)
Sodium: 137 mmol/L (ref 135–145)
Sodium: 138 mmol/L (ref 135–145)
Sodium: 138 mmol/L (ref 135–145)
TCO2: 30 mmol/L (ref 22–32)
TCO2: 30 mmol/L (ref 22–32)
TCO2: 31 mmol/L (ref 22–32)
pCO2, Ven: 42.6 mmHg — ABNORMAL LOW (ref 44–60)
pCO2, Ven: 42.8 mmHg — ABNORMAL LOW (ref 44–60)
pCO2, Ven: 43.7 mmHg — ABNORMAL LOW (ref 44–60)
pH, Ven: 7.435 — ABNORMAL HIGH (ref 7.25–7.43)
pH, Ven: 7.438 — ABNORMAL HIGH (ref 7.25–7.43)
pH, Ven: 7.438 — ABNORMAL HIGH (ref 7.25–7.43)
pO2, Ven: 32 mmHg (ref 32–45)
pO2, Ven: 32 mmHg (ref 32–45)
pO2, Ven: 33 mmHg (ref 32–45)

## 2022-12-06 LAB — COMPREHENSIVE METABOLIC PANEL
ALT: 87 U/L — ABNORMAL HIGH (ref 0–44)
AST: 74 U/L — ABNORMAL HIGH (ref 15–41)
Albumin: 3.6 g/dL (ref 3.5–5.0)
Alkaline Phosphatase: 189 U/L — ABNORMAL HIGH (ref 38–126)
Anion gap: 13 (ref 5–15)
BUN: 25 mg/dL — ABNORMAL HIGH (ref 6–20)
CO2: 29 mmol/L (ref 22–32)
Calcium: 9.8 mg/dL (ref 8.9–10.3)
Chloride: 94 mmol/L — ABNORMAL LOW (ref 98–111)
Creatinine, Ser: 2.01 mg/dL — ABNORMAL HIGH (ref 0.44–1.00)
GFR, Estimated: 29 mL/min — ABNORMAL LOW (ref >60)
Glucose, Bld: 116 mg/dL — ABNORMAL HIGH (ref 70–99)
Potassium: 3.8 mmol/L (ref 3.5–5.1)
Sodium: 136 mmol/L (ref 135–145)
Total Bilirubin: 2 mg/dL — ABNORMAL HIGH (ref 0.3–1.2)
Total Protein: 7.2 g/dL (ref 6.5–8.1)

## 2022-12-06 LAB — CBC WITH DIFFERENTIAL/PLATELET
Abs Immature Granulocytes: 0.04 10*3/uL (ref 0.00–0.07)
Basophils Absolute: 0 10*3/uL (ref 0.0–0.1)
Basophils Relative: 0 %
Eosinophils Absolute: 0.1 10*3/uL (ref 0.0–0.5)
Eosinophils Relative: 1 %
HCT: 34.3 % — ABNORMAL LOW (ref 36.0–46.0)
Hemoglobin: 10.8 g/dL — ABNORMAL LOW (ref 12.0–15.0)
Immature Granulocytes: 0 %
Lymphocytes Relative: 22 %
Lymphs Abs: 2.3 10*3/uL (ref 0.7–4.0)
MCH: 22 pg — ABNORMAL LOW (ref 26.0–34.0)
MCHC: 31.5 g/dL (ref 30.0–36.0)
MCV: 69.7 fL — ABNORMAL LOW (ref 80.0–100.0)
Monocytes Absolute: 1.2 10*3/uL — ABNORMAL HIGH (ref 0.1–1.0)
Monocytes Relative: 11 %
Neutro Abs: 6.9 10*3/uL (ref 1.7–7.7)
Neutrophils Relative %: 66 %
Platelets: 318 10*3/uL (ref 150–400)
RBC: 4.92 MIL/uL (ref 3.87–5.11)
RDW: 17.7 % — ABNORMAL HIGH (ref 11.5–15.5)
WBC: 10.6 10*3/uL — ABNORMAL HIGH (ref 4.0–10.5)
nRBC: 0 % (ref 0.0–0.2)

## 2022-12-06 LAB — UREA NITROGEN, URINE: Urea Nitrogen, Ur: 740 mg/dL

## 2022-12-06 LAB — COOXEMETRY PANEL
Carboxyhemoglobin: 1.7 % — ABNORMAL HIGH (ref 0.5–1.5)
Methemoglobin: <0.7 % (ref 0.0–1.5)
O2 Saturation: 79.1 %
Total hemoglobin: 9.9 g/dL — ABNORMAL LOW (ref 12.0–16.0)

## 2022-12-06 LAB — MAGNESIUM: Magnesium: 2.2 mg/dL (ref 1.7–2.4)

## 2022-12-06 SURGERY — RIGHT HEART CATH
Anesthesia: LOCAL

## 2022-12-06 MED ORDER — ENOXAPARIN SODIUM 40 MG/0.4ML IJ SOSY
40.0000 mg | PREFILLED_SYRINGE | INTRAMUSCULAR | Status: DC
  Administered 2022-12-07 – 2022-12-13 (×7): 40 mg via SUBCUTANEOUS
  Filled 2022-12-06 (×7): qty 0.4

## 2022-12-06 MED ORDER — SODIUM CHLORIDE 0.9% FLUSH: 3.0000 mL | INTRAVENOUS | Status: DC | PRN

## 2022-12-06 MED ORDER — SODIUM CHLORIDE 0.9% FLUSH
3.0000 mL | Freq: Two times a day (BID) | INTRAVENOUS | Status: DC
  Administered 2022-12-09 – 2022-12-11 (×3): 3 mL via INTRAVENOUS

## 2022-12-06 MED ORDER — SODIUM CHLORIDE 0.9 % IV SOLN: 250.0000 mL | INTRAVENOUS | Status: DC | PRN

## 2022-12-06 MED ORDER — SODIUM CHLORIDE 0.9% FLUSH
10.0000 mL | Freq: Two times a day (BID) | INTRAVENOUS | Status: DC
  Administered 2022-12-06: 20 mL
  Administered 2022-12-07 – 2022-12-13 (×6): 10 mL

## 2022-12-06 MED ORDER — LABETALOL HCL 5 MG/ML IV SOLN: 10.0000 mg | INTRAVENOUS | Status: AC | PRN

## 2022-12-06 MED ORDER — SODIUM CHLORIDE 0.9 % IV SOLN: INTRAVENOUS | Status: DC

## 2022-12-06 MED ORDER — MILRINONE LACTATE IN DEXTROSE 20-5 MG/100ML-% IV SOLN
0.2500 ug/kg/min | INTRAVENOUS | Status: DC
  Administered 2022-12-06 – 2022-12-11 (×11): 0.25 ug/kg/min via INTRAVENOUS
  Filled 2022-12-06 (×10): qty 100

## 2022-12-06 MED ORDER — ONDANSETRON HCL 4 MG/2ML IJ SOLN
4.0000 mg | Freq: Four times a day (QID) | INTRAMUSCULAR | Status: DC | PRN
  Administered 2022-12-07 (×2): 4 mg via INTRAVENOUS
  Filled 2022-12-06 (×2): qty 2

## 2022-12-06 MED ORDER — LIDOCAINE HCL (PF) 1 % IJ SOLN
INTRAMUSCULAR | Status: AC
  Filled 2022-12-06: qty 30

## 2022-12-06 MED ORDER — ACETAMINOPHEN 325 MG PO TABS
650.0000 mg | ORAL_TABLET | ORAL | Status: DC | PRN
  Administered 2022-12-11: 650 mg via ORAL
  Filled 2022-12-06: qty 2

## 2022-12-06 MED ORDER — HEPARIN (PORCINE) IN NACL 1000-0.9 UT/500ML-% IV SOLN
INTRAVENOUS | Status: AC
  Filled 2022-12-06: qty 500

## 2022-12-06 MED ORDER — HEPARIN (PORCINE) IN NACL 1000-0.9 UT/500ML-% IV SOLN
INTRAVENOUS | Status: DC | PRN
  Administered 2022-12-06: 500 mL

## 2022-12-06 MED ORDER — LIDOCAINE HCL (PF) 1 % IJ SOLN
INTRAMUSCULAR | Status: DC | PRN
  Administered 2022-12-06: 2 mL

## 2022-12-06 MED ORDER — CHLORHEXIDINE GLUCONATE CLOTH 2 % EX PADS
6.0000 | MEDICATED_PAD | Freq: Every day | CUTANEOUS | Status: DC
  Administered 2022-12-07 – 2022-12-13 (×7): 6 via TOPICAL

## 2022-12-06 MED ORDER — SODIUM CHLORIDE 0.9% FLUSH: 10.0000 mL | INTRAVENOUS | Status: DC | PRN

## 2022-12-06 MED ORDER — HYDRALAZINE HCL 20 MG/ML IJ SOLN: 10.0000 mg | INTRAMUSCULAR | Status: AC | PRN

## 2022-12-06 SURGICAL SUPPLY — 6 items
CATH SWAN GANZ 7F STRAIGHT (CATHETERS) IMPLANT
GLIDESHEATH SLENDER 7FR .021G (SHEATH) IMPLANT
PACK CARDIAC CATHETERIZATION (CUSTOM PROCEDURE TRAY) ×1 IMPLANT
TRANSDUCER W/STOPCOCK (MISCELLANEOUS) ×1 IMPLANT
TUBING ART PRESS 72  MALE/FEM (TUBING) ×1
TUBING ART PRESS 72 MALE/FEM (TUBING) IMPLANT

## 2022-12-06 NOTE — Progress Notes (Signed)
   Heart Failure Stewardship Pharmacist Progress Note   PCP: Jonetta Osgood, NP PCP-Cardiologist: None    HPI:  56 yo F with PMH of HTN and morbid obesity s/p gastric bypass.   She presented to the ED on 12/23 with shortness of breath, nausea, LE edema, orthopnea, and chest pressure. Treated for pneumonia 2-3 months prior. ECHO 12/25 showed LVEF <20%, global hypokinesis, G2DD, RV mildly reduced. Pending R/LHC today.  Current HF Medications: Diuretic: furosemide 120 mg IV BID Beta Blocker: carvedilol 3.125 mg BID MRA: spironolactone 25 mg daily SGLT2i: Jardiance 10 mg daily Other: Ferrlecit 125 mg IV daily x 4 doses  Prior to admission HF Medications: None  Pertinent Lab Values: Serum creatinine 2.01, BUN 25, Potassium 3.8, Sodium 136, BNP 777.6, Magnesium 2.2, A1c 6.4  Ferritin 11, TSAT 2  Vital Signs: Weight: 308 lbs (admission weight: 308 lbs) Blood pressure: 100/60s  Heart rate: 70s  I/O: net -2.3L  Medication Assistance / Insurance Benefits Check: Does the patient have prescription insurance?  Yes Type of insurance plan: [facility] commercial insurance  Outpatient Pharmacy:  Prior to admission [facility] pharmacy[facility] Is the patient willing to use [place] at discharge? Yes Is the patient willing to transition their outpatient pharmacy to utilize a [facility] outpatient pharmacy?   Pending    Assessment: 1. Acute systolic CHF (LVEF <17%), pending ischemic evaluation. NYHA class IV symptoms. - Continue furosemide 120 mg IV BID. Strict I/Os and daily weights. Keep K>4 and Mag>2. - Continue carvedilol 3.125 mg BID pending cath today. Would need to stop if she is low output.  - Consider starting losartan 25 mg daily vs Entresto once renal function stabilizes - Continue spironolactone 25 mg daily - Continue Jardiance 10 mg daily - Iron stores low, Ferrlecit 250 mg IV daily started 12/28 x 4 doses   Plan: 1) Medication changes recommended at this time: -  None pending cath today, may need to stop BB pending hemodynamics.  2) Patient assistance: - Entresto copay [phone], copay card lowers to $10 per fill - Jardiance copay $30, copay card lowers to $10 per month  3)  Education  - Patient has been educated on current HF medications and potential additions to HF medication regimen - Patient verbalizes understanding that over the next few months, these medication doses may change and more medications may be added to optimize HF regimen - Patient has been educated on basic disease state pathophysiology and goals of therapy   Kerby Nora, PharmD, BCPS Heart Failure Stewardship Pharmacist Phone [phone]

## 2022-12-06 NOTE — Interval H&P Note (Signed)
History and Physical Interval Note:  12/06/2022 3:29 PM  Stephanie Gates  has presented today for surgery, with the diagnosis of acute systolic heart failure.  The various methods of treatment have been discussed with the patient and family. After consideration of risks, benefits and other options for treatment, the patient has consented to  Procedure(s): RIGHT HEART CATH (N/A) as a surgical intervention.  The patient's history has been reviewed, patient examined, no change in status, stable for surgery.  I have reviewed the patient's chart and labs.  Questions were answered to the patient's satisfaction.     Nikhita Mentzel

## 2022-12-06 NOTE — H&P (View-Only) (Signed)
Cardiology Progress Note  Patient ID: Stephanie Gates MRN: [id number] DOB: May 03, 1966 Date of Encounter: 12/06/2022  Primary Cardiologist: None  Subjective   Chief Complaint: SOB  HPI: Creatinine stable.  Still appears volume up.  Plan for right heart catheterization today to better define hemodynamics.  ROS:  All other ROS reviewed and negative. Pertinent positives noted in the HPI.     Inpatient Medications  Scheduled Meds:  aspirin  81 mg Oral Daily   atorvastatin  40 mg Oral Daily   carvedilol  3.125 mg Oral BID WC   docusate sodium  100 mg Oral BID   empagliflozin  10 mg Oral Daily   heparin injection (subcutaneous)  5,000 Units Subcutaneous Q8H   multivitamin with minerals  1 tablet Oral Daily   pantoprazole  40 mg Oral Daily   Ensure Max Protein  11 oz Oral BID   sodium chloride flush  3 mL Intravenous Q12H   sodium chloride flush  3 mL Intravenous Q12H   spironolactone  25 mg Oral Daily   Continuous Infusions:  sodium chloride     sodium chloride 10 mL/hr at 12/06/22 0530   ferric gluconate (FERRLECIT) IVPB 125 mg (12/05/22 1309)   furosemide 120 mg (12/05/22 1819)   PRN Meds: sodium chloride, acetaminophen **OR** acetaminophen, albuterol, alum & mag hydroxide-simeth, bisacodyl, guaiFENesin-dextromethorphan, hydrALAZINE, nitroGLYCERIN, ondansetron **OR** ondansetron (ZOFRAN) IV, oxyCODONE, polyethylene glycol, sodium chloride flush, traZODone   Vital Signs   Vitals:   12/06/22 0000 12/06/22 0430 12/06/22 0441 12/06/22 0834  BP: 117/77 91/60  105/61  Pulse: 74 72  71  Resp: (!) 24 18 (!) 22 19  Temp: 97.7 F (36.5 C) 97.7 F (36.5 C)  97.7 F (36.5 C)  TempSrc: Oral Oral  Oral  SpO2: 97% 97%  96%  Weight:   (!) 138.7 kg   Height:        Intake/Output Summary (Last 24 hours) at 12/06/2022 0856 Last data filed at 12/05/2022 2255 Gross per 24 hour  Intake 1612.12 ml  Output 1900 ml  Net -287.88 ml      12/06/2022    4:41 AM 12/05/2022     5:07 AM 12/04/2022    4:53 AM  Last 3 Weights  Weight (lbs) 305 lb 12.5 oz 308 lb 10.3 oz 307 lb 8.7 oz  Weight (kg) 138.7 kg 140 kg 139.5 kg      Telemetry  Overnight telemetry shows sinus rhythm in the 70s, which I personally reviewed.   Physical Exam   Vitals:   12/06/22 0000 12/06/22 0430 12/06/22 0441 12/06/22 0834  BP: 117/77 91/60  105/61  Pulse: 74 72  71  Resp: (!) 24 18 (!) 22 19  Temp: 97.7 F (36.5 C) 97.7 F (36.5 C)  97.7 F (36.5 C)  TempSrc: Oral Oral  Oral  SpO2: 97% 97%  96%  Weight:   (!) 138.7 kg   Height:        Intake/Output Summary (Last 24 hours) at 12/06/2022 0856 Last data filed at 12/05/2022 2255 Gross per 24 hour  Intake 1612.12 ml  Output 1900 ml  Net -287.88 ml       12/06/2022    4:41 AM 12/05/2022    5:07 AM 12/04/2022    4:53 AM  Last 3 Weights  Weight (lbs) 305 lb 12.5 oz 308 lb 10.3 oz 307 lb 8.7 oz  Weight (kg) 138.7 kg 140 kg 139.5 kg    Body mass index is 50.88 kg/m.  General:  Well nourished, well developed, in no acute distress Head: Atraumatic, normal size  Eyes: PEERLA, EOMI  Neck: Supple, JVD 10-12 cmH2O Endocrine: No thryomegaly Cardiac: Normal S1, S2; RRR; no murmurs, rubs, or gallops Lungs: Crackles at the bases of the lungs Abd: Soft, nontender, no hepatomegaly  Ext: 2+ pitting edema Musculoskeletal: No deformities, BUE and BLE strength normal and equal Skin: Warm and dry, no rashes   Neuro: Alert and oriented to person, place, time, and situation, CNII-XII grossly intact, no focal deficits  Psych: Normal mood and affect   Labs  High Sensitivity Troponin:   Recent Labs  Lab 11/30/22 2120 11/30/22 2357 12/01/22 0904 12/02/22 2232 12/03/22 0027  TROPONINIHS 895* 917* 720* 397* 382*     Cardiac EnzymesNo results for input(s): "TROPONINI" in the last 168 hours. No results for input(s): "TROPIPOC" in the last 168 hours.  Chemistry Recent Labs  Lab 12/04/22 0205 12/05/22 0148 12/06/22 0526  NA 137 136  136  K 4.5 4.6 3.8  CL 100 100 94*  CO2 '27 26 29  '$ GLUCOSE 113* 107* 116*  BUN 21* 26* 25*  CREATININE 2.02* 2.10* 2.01*  CALCIUM 9.6 9.7 9.8  PROT 6.7 6.4* 7.2  ALBUMIN 3.4* 3.3* 3.6  AST 45* 53* 74*  ALT 55* 64* 87*  ALKPHOS 110 154* 189*  BILITOT 2.0* 1.9* 2.0*  GFRNONAA 28* 27* 29*  ANIONGAP '10 10 13    '$ Hematology Recent Labs  Lab 12/04/22 0205 12/05/22 0148 12/06/22 0526  WBC 11.2* 12.4* 10.6*  RBC 4.78 4.83 4.92  HGB 10.5* 10.4* 10.8*  HCT 34.0* 34.6* 34.3*  MCV 71.1* 71.6* 69.7*  MCH 22.0* 21.5* 22.0*  MCHC 30.9 30.1 31.5  RDW 17.7* 17.6* 17.7*  PLT 338 295 318   BNP Recent Labs  Lab 11/30/22 2119  BNP 777.6*    DDimer No results for input(s): "DDIMER" in the last 168 hours.   Radiology  [place] EKG SITE RITE  Result Date: 12/06/2022 If Site Rite image not attached, placement could not be confirmed due to current cardiac rhythm.   Cardiac Studies  TTE 12/02/2022  1. Left ventricular ejection fraction, by estimation, is <20%. The left  ventricle has severely decreased function. The left ventricle demonstrates  global hypokinesis. The left ventricular internal cavity size was severely  dilated. Left ventricular  diastolic parameters are consistent with Grade II diastolic dysfunction  (pseudonormalization). Elevated left ventricular end-diastolic pressure.   2. Right ventricular systolic function is mildly reduced. The right  ventricular size is normal. There is normal pulmonary artery systolic  pressure.   3. Left atrial size was mildly dilated.   4. Right atrial size was mildly dilated.   5. Functional mitral regurgitation due to left ventricular disfunction  and annular dilatation. The mitral valve is degenerative. Moderate to  severe mitral valve regurgitation. No evidence of mitral stenosis.   6. Tricuspid valve regurgitation is severe.   7. The aortic valve is tricuspid. Aortic valve regurgitation is trivial.  No aortic stenosis is present.   8.  Pulmonic valve regurgitation is moderate.   9. The inferior vena cava is normal in size with greater than 50%  respiratory variability, suggesting right atrial pressure of 3 mmHg.   Patient Profile  Stephanie Gates is a 56 y.o. female with hypertension, obesity who was admitted on 12/01/2022 for new onset systolic heart failure.    Assessment & Plan   # Acute systolic heart failure, EF less than 20% # Severe tricuspid valve regurgitation/severe  mitral valve regurgitation # Left bundle branch block -Remains volume up.  Creatinine has reached his plateau.  Her volume status is a bit difficult.  Her kidney function is concerning.  We will proceed with right heart catheterization today to better define hemodynamics.  May need inotropic support. -Blood pressure stable.  No signs of shock.  She may be in low output.  Right heart today. -For now okay to continue carvedilol 3.125 mg twice daily.  Continue Aldactone 25 mg daily.  Continue Jardiance 10 mg daily. -Further titration of medical therapy pending right heart catheterization.  Awaiting for kidney function to improve before starting ARNI therapy. -HIV negative.  A1c 6.4.  Cardiomyopathy could be hypertension and left bundle branch block related.  Will need an ischemia evaluation but cannot do this until kidney function has improved. -If no significant CAD will likely benefit from outpatient CMR.  # AKI -Multifactorial in the setting of decompensated systolic heart failure and did receive IV contrast on admission.  Renal ultrasound without obstruction.  No significant medical disease seen. -Continue with diuretics. -Right heart catheterization today to define hemodynamics.  # Elevated troponin, demand ischemia versus non-STEMI -No chest pain reported.  Could be demand in the setting of systolic heart failure. -Completed 48 hours of heparin. -Continue aspirin and statin therapy. -Left heart catheterization once kidneys have  improved.  # FEN -No intravenous fluids -DVT PPx: Heparin subcutaneous -Code: Full -Diet: N.p.o. for right heart catheterization today   For questions or updates, please contact [place] Please consult [email or web address] for contact info under        Signed, Lake Bells T. Audie Box, MD, [place]  12/06/2022 8:56 AM

## 2022-12-06 NOTE — Progress Notes (Signed)
Cardiology Progress Note  Patient ID: Stephanie Gates MRN: [id number] DOB: 06-04-1966 Date of Encounter: 12/06/2022  Primary Cardiologist: None  Subjective   Chief Complaint: SOB  HPI: Creatinine stable.  Still appears volume up.  Plan for right heart catheterization today to better define hemodynamics.  ROS:  All other ROS reviewed and negative. Pertinent positives noted in the HPI.     Inpatient Medications  Scheduled Meds:  aspirin  81 mg Oral Daily   atorvastatin  40 mg Oral Daily   carvedilol  3.125 mg Oral BID WC   docusate sodium  100 mg Oral BID   empagliflozin  10 mg Oral Daily   heparin injection (subcutaneous)  5,000 Units Subcutaneous Q8H   multivitamin with minerals  1 tablet Oral Daily   pantoprazole  40 mg Oral Daily   Ensure Max Protein  11 oz Oral BID   sodium chloride flush  3 mL Intravenous Q12H   sodium chloride flush  3 mL Intravenous Q12H   spironolactone  25 mg Oral Daily   Continuous Infusions:  sodium chloride     sodium chloride 10 mL/hr at 12/06/22 0530   ferric gluconate (FERRLECIT) IVPB 125 mg (12/05/22 1309)   furosemide 120 mg (12/05/22 1819)   PRN Meds: sodium chloride, acetaminophen **OR** acetaminophen, albuterol, alum & mag hydroxide-simeth, bisacodyl, guaiFENesin-dextromethorphan, hydrALAZINE, nitroGLYCERIN, ondansetron **OR** ondansetron (ZOFRAN) IV, oxyCODONE, polyethylene glycol, sodium chloride flush, traZODone   Vital Signs   Vitals:   12/06/22 0000 12/06/22 0430 12/06/22 0441 12/06/22 0834  BP: 117/77 91/60  105/61  Pulse: 74 72  71  Resp: (!) 24 18 (!) 22 19  Temp: 97.7 F (36.5 C) 97.7 F (36.5 C)  97.7 F (36.5 C)  TempSrc: Oral Oral  Oral  SpO2: 97% 97%  96%  Weight:   (!) 138.7 kg   Height:        Intake/Output Summary (Last 24 hours) at 12/06/2022 0856 Last data filed at 12/05/2022 2255 Gross per 24 hour  Intake 1612.12 ml  Output 1900 ml  Net -287.88 ml      12/06/2022    4:41 AM 12/05/2022     5:07 AM 12/04/2022    4:53 AM  Last 3 Weights  Weight (lbs) 305 lb 12.5 oz 308 lb 10.3 oz 307 lb 8.7 oz  Weight (kg) 138.7 kg 140 kg 139.5 kg      Telemetry  Overnight telemetry shows sinus rhythm in the 70s, which I personally reviewed.   Physical Exam   Vitals:   12/06/22 0000 12/06/22 0430 12/06/22 0441 12/06/22 0834  BP: 117/77 91/60  105/61  Pulse: 74 72  71  Resp: (!) 24 18 (!) 22 19  Temp: 97.7 F (36.5 C) 97.7 F (36.5 C)  97.7 F (36.5 C)  TempSrc: Oral Oral  Oral  SpO2: 97% 97%  96%  Weight:   (!) 138.7 kg   Height:        Intake/Output Summary (Last 24 hours) at 12/06/2022 0856 Last data filed at 12/05/2022 2255 Gross per 24 hour  Intake 1612.12 ml  Output 1900 ml  Net -287.88 ml       12/06/2022    4:41 AM 12/05/2022    5:07 AM 12/04/2022    4:53 AM  Last 3 Weights  Weight (lbs) 305 lb 12.5 oz 308 lb 10.3 oz 307 lb 8.7 oz  Weight (kg) 138.7 kg 140 kg 139.5 kg    Body mass index is 50.88 kg/m.  General:  Well nourished, well developed, in no acute distress Head: Atraumatic, normal size  Eyes: PEERLA, EOMI  Neck: Supple, JVD 10-12 cmH2O Endocrine: No thryomegaly Cardiac: Normal S1, S2; RRR; no murmurs, rubs, or gallops Lungs: Crackles at the bases of the lungs Abd: Soft, nontender, no hepatomegaly  Ext: 2+ pitting edema Musculoskeletal: No deformities, BUE and BLE strength normal and equal Skin: Warm and dry, no rashes   Neuro: Alert and oriented to person, place, time, and situation, CNII-XII grossly intact, no focal deficits  Psych: Normal mood and affect   Labs  High Sensitivity Troponin:   Recent Labs  Lab 11/30/22 2120 11/30/22 2357 12/01/22 0904 12/02/22 2232 12/03/22 0027  TROPONINIHS 895* 917* 720* 397* 382*     Cardiac EnzymesNo results for input(s): "TROPONINI" in the last 168 hours. No results for input(s): "TROPIPOC" in the last 168 hours.  Chemistry Recent Labs  Lab 12/04/22 0205 12/05/22 0148 12/06/22 0526  NA 137 136  136  K 4.5 4.6 3.8  CL 100 100 94*  CO2 '27 26 29  '$ GLUCOSE 113* 107* 116*  BUN 21* 26* 25*  CREATININE 2.02* 2.10* 2.01*  CALCIUM 9.6 9.7 9.8  PROT 6.7 6.4* 7.2  ALBUMIN 3.4* 3.3* 3.6  AST 45* 53* 74*  ALT 55* 64* 87*  ALKPHOS 110 154* 189*  BILITOT 2.0* 1.9* 2.0*  GFRNONAA 28* 27* 29*  ANIONGAP '10 10 13    '$ Hematology Recent Labs  Lab 12/04/22 0205 12/05/22 0148 12/06/22 0526  WBC 11.2* 12.4* 10.6*  RBC 4.78 4.83 4.92  HGB 10.5* 10.4* 10.8*  HCT 34.0* 34.6* 34.3*  MCV 71.1* 71.6* 69.7*  MCH 22.0* 21.5* 22.0*  MCHC 30.9 30.1 31.5  RDW 17.7* 17.6* 17.7*  PLT 338 295 318   BNP Recent Labs  Lab 11/30/22 2119  BNP 777.6*    DDimer No results for input(s): "DDIMER" in the last 168 hours.   Radiology  [place] EKG SITE RITE  Result Date: 12/06/2022 If Site Rite image not attached, placement could not be confirmed due to current cardiac rhythm.   Cardiac Studies  TTE 12/02/2022  1. Left ventricular ejection fraction, by estimation, is <20%. The left  ventricle has severely decreased function. The left ventricle demonstrates  global hypokinesis. The left ventricular internal cavity size was severely  dilated. Left ventricular  diastolic parameters are consistent with Grade II diastolic dysfunction  (pseudonormalization). Elevated left ventricular end-diastolic pressure.   2. Right ventricular systolic function is mildly reduced. The right  ventricular size is normal. There is normal pulmonary artery systolic  pressure.   3. Left atrial size was mildly dilated.   4. Right atrial size was mildly dilated.   5. Functional mitral regurgitation due to left ventricular disfunction  and annular dilatation. The mitral valve is degenerative. Moderate to  severe mitral valve regurgitation. No evidence of mitral stenosis.   6. Tricuspid valve regurgitation is severe.   7. The aortic valve is tricuspid. Aortic valve regurgitation is trivial.  No aortic stenosis is present.   8.  Pulmonic valve regurgitation is moderate.   9. The inferior vena cava is normal in size with greater than 50%  respiratory variability, suggesting right atrial pressure of 3 mmHg.   Patient Profile  Stephanie Gates is a 56 y.o. female with hypertension, obesity who was admitted on 12/01/2022 for new onset systolic heart failure.    Assessment & Plan   # Acute systolic heart failure, EF less than 20% # Severe tricuspid valve regurgitation/severe  mitral valve regurgitation # Left bundle branch block -Remains volume up.  Creatinine has reached his plateau.  Her volume status is a bit difficult.  Her kidney function is concerning.  We will proceed with right heart catheterization today to better define hemodynamics.  May need inotropic support. -Blood pressure stable.  No signs of shock.  She may be in low output.  Right heart today. -For now okay to continue carvedilol 3.125 mg twice daily.  Continue Aldactone 25 mg daily.  Continue Jardiance 10 mg daily. -Further titration of medical therapy pending right heart catheterization.  Awaiting for kidney function to improve before starting ARNI therapy. -HIV negative.  A1c 6.4.  Cardiomyopathy could be hypertension and left bundle branch block related.  Will need an ischemia evaluation but cannot do this until kidney function has improved. -If no significant CAD will likely benefit from outpatient CMR.  # AKI -Multifactorial in the setting of decompensated systolic heart failure and did receive IV contrast on admission.  Renal ultrasound without obstruction.  No significant medical disease seen. -Continue with diuretics. -Right heart catheterization today to define hemodynamics.  # Elevated troponin, demand ischemia versus non-STEMI -No chest pain reported.  Could be demand in the setting of systolic heart failure. -Completed 48 hours of heparin. -Continue aspirin and statin therapy. -Left heart catheterization once kidneys have  improved.  # FEN -No intravenous fluids -DVT PPx: Heparin subcutaneous -Code: Full -Diet: N.p.o. for right heart catheterization today   For questions or updates, please contact [place] Please consult [email or web address] for contact info under        Signed, Lake Bells T. Audie Box, MD, [place]  12/06/2022 8:56 AM

## 2022-12-06 NOTE — Progress Notes (Signed)
Occupational Therapy Treatment Patient Details Name: Stephanie Gates MRN: [id number] DOB: 30-Apr-1966 Today's Date: 12/06/2022   History of present illness Pt is a 56 y.o. F admitted 11/30/22 with SOB, BLE edema, emesis. Workup for new onset CHF. PMH includes HTN, HLD, DM, obesity.   OT comments  Pt. Seen for skilled OT treatment session.  Dtr. Present and very helpful and active in pts. Care.  Pt. Able to complete ambulation to/from b.room for toileting task min guard a.  Lb dressing mod a.  Handouts and education provided on energy conservation including safety and ease of use of A/E for LB ADLs. Pt. Interested and states she will research more about it but also has family that can assist with LB ADLs.  Pt. Receptive to energy conservation strategies and was able to provide examples ie: rest breaks.  Eager to "feel better" and have "more energy".  Reviewed balance of activity and rest as she continues to heal and get better.  Agree with current d/c recommendations.     Recommendations for follow up therapy are one component of a multi-disciplinary discharge planning process, led by the attending physician.  Recommendations may be updated based on patient status, additional functional criteria and insurance authorization.    Follow Up Recommendations  No OT follow up     Assistance Recommended at Discharge PRN  Patient can return home with the following  A little help with bathing/dressing/bathroom;Assistance with cooking/housework;Assist for transportation   Equipment Recommendations  BSC/3in1    Recommendations for Other Services      Precautions / Restrictions Precautions Precautions: Fall Precaution Comments: watch HR       Mobility Bed Mobility               General bed mobility comments: seated eob at beginning and end of session    Transfers Overall transfer level: Needs assistance Equipment used: None Transfers: Sit to/from Stand, Bed to  chair/wheelchair/BSC Sit to Stand: Min guard Stand pivot transfers: Min guard               Balance                                           ADL either performed or assessed with clinical judgement   ADL Overall ADL's : Needs assistance/impaired                     Lower Body Dressing: Moderate assistance Lower Body Dressing Details (indicate cue type and reason): reviewed A/E as option, pt. interested states she will research options online but also has family available at home to assist with LB adls as needed Toilet Transfer: Min guard;Ambulation;Regular Toilet   Toileting- Clothing Manipulation and Hygiene: Supervision/safety;Sitting/lateral lean         General ADL Comments: procedure pending for later today, energy conservation handouts provided and reviewed with pt. and dtr. who was present for session.  Discussed need for someone to be with her during first attempts at tub transfer, bathing, and dressing. Dtr. Reports she will be present for those tasks. Both verbalized understanding for having assistance for fall prevention and also as pt. Reports these tasks are still causing great fatigue.     Extremity/Trunk Assessment              Vision       Perception     Praxis  Cognition Arousal/Alertness: Awake/alert Behavior During Therapy: WFL for tasks assessed/performed Overall Cognitive Status: Within Functional Limits for tasks assessed                                          Exercises      Shoulder Instructions       General Comments      Pertinent Vitals/ Pain       Pain Assessment Pain Assessment: Faces Faces Pain Scale: Hurts a little bit Pain Location: R IV site, swelling/bump present after placed this am IV team present after my arrival to assess Pain Intervention(s): Monitored during session  Home Living                                          Prior  Functioning/Environment              Frequency  Min 2X/week        Progress Toward Goals  OT Goals(current goals can now be found in the care plan section)  Progress towards OT goals: Progressing toward goals     Plan Discharge plan remains appropriate    Co-evaluation                 AM-PAC OT "6 Clicks" Daily Activity     Outcome Measure   Help from another person eating meals?: None Help from another person taking care of personal grooming?: None Help from another person toileting, which includes using toliet, bedpan, or urinal?: A Little Help from another person bathing (including washing, rinsing, drying)?: A Lot Help from another person to put on and taking off regular upper body clothing?: A Little Help from another person to put on and taking off regular lower body clothing?: A Lot 6 Click Score: 18    End of Session    OT Visit Diagnosis: Unsteadiness on feet (R26.81);Other abnormalities of gait and mobility (R26.89);Muscle weakness (generalized) (M62.81)   Activity Tolerance Patient tolerated treatment well   Patient Left in bed;with call bell/phone within reach;with family/visitor present   Nurse Communication Other (comment) (iv team present at beginning of session to check recently placed iv that was causing discomfort. a new iv was placed)        Time: 1030-1058 OT Time Calculation (min): 28 min  Charges: OT General Charges $OT Visit: 1 Visit OT Treatments $Self Care/Home Management : 23-37 mins  Sonia Baller, COTA/L Acute Rehabilitation [phone]   Clearnce Sorrel Lorraine-COTA/L 12/06/2022, 12:50 PM

## 2022-12-06 NOTE — Progress Notes (Signed)
Peripherally Inserted Central Catheter Placement  The IV Nurse has discussed with the patient and/or persons authorized to consent for the patient, the purpose of this procedure and the potential benefits and risks involved with this procedure.  The benefits include less needle sticks, lab draws from the catheter, and the patient may be discharged home with the catheter. Risks include, but not limited to, infection, bleeding, blood clot (thrombus formation), and puncture of an artery; nerve damage and irregular heartbeat and possibility to perform a PICC exchange if needed/ordered by physician.  Alternatives to this procedure were also discussed.  Bard Power PICC patient education guide, fact sheet on infection prevention and patient information card has been provided to patient /or left at bedside.    PICC Placement Documentation  PICC Double Lumen 12/06/22 Right Brachial 35 cm 0 cm (Active)  Indication for Insertion or Continuance of Line Chronic illness with exacerbations (CF, Sickle Cell, etc.) 12/06/22 1900  Exposed Catheter (cm) 0 cm 12/06/22 1900  Site Assessment Clean, Dry, Intact 12/06/22 1900  Lumen #1 Status Flushed;Saline locked;Blood return noted 12/06/22 1900  Lumen #2 Status Flushed;Saline locked;Blood return noted 12/06/22 1900  Dressing Type Transparent;Securing device 12/06/22 1900  Dressing Status Antimicrobial disc in place;Clean, Dry, Intact 12/06/22 1900  Safety Lock Not Applicable 12/87/86 7672  Line Care Connections checked and tightened 12/06/22 1900  Dressing Intervention New dressing 12/06/22 1900  Dressing Change Due 12/13/22 12/06/22 1900       Darlyn Read 12/06/2022, 7:22 PM

## 2022-12-06 NOTE — Progress Notes (Signed)
Mobility Specialist Progress Note:   12/06/22 1512  Mobility  Activity Off unit   Will follow-up as time allows.   Gareth Eagle Abram Sax Mobility Specialist Please contact via [employer] or  Rehab Office at [phone]

## 2022-12-06 NOTE — Consult Note (Addendum)
Advanced Heart Failure Team Consult Note   Primary Physician: Jonetta Osgood, NP PCP-Cardiologist:  None  Reason for Consultation: acute combined systolic and diastolic heart failure  HPI:    Sharaine Delange is seen today for evaluation of acute combined systolic and diastolic heart failure at the request of Dr. Audie Box, cardiology.   Ms. Prevost is a 56 y.o. female with HTN and morbid obesity s/p bypass surgery. AHF team asked to see for acute combined systolic and diastolic heart failure.   Symptoms started around 3 months ago, prior to this she was at baseline able to do everything with no issues. 3 months ago she was diagnosed with bronchitis followed by pneumonia for which she was treated with antibiotics. Had chest discomfort 2/2 frequent coughing. Abx seemed to briefly help but SOB quickly returned. Within the last 3 weeks she has noticed worsening symptoms, SOB, abdominal distention, BLE edema, PND, and orthopnea. Occasionally right sided chest pain, not correlated with activity, resolves with rest. Has been seeing her PCP and some medications changes were made. Fam history significant for mom with HTN, DM2, CHF, and kidney issues. Brother with kidney issues as well. Her only daughter is healthy. Denies smoking, drinking, and illicit drug use. Works from home, mostly sitting, for [employer]. Does eat out about 50% of the time, appetite has been poor recently. 3 months ago she weighted about 280 and most recently at home she weight up to 314, so she's had about a 34lb weight gain.   Presented to [facility] 12/23 with worsening symptoms. Workup with Cr. 1.15, K 2.6, BNP 777, WBC 11.1, normal TSH, HsTrop 895>917>720. CXR negative. CTA with pulmonary vascular congestion and groundglass opacity in bilateral lower lobe suggesting pulmonary edema, small right pleural effusion, generalized anasarca and skin thickening secondary to heart failure. Patient was diuresed with IV lasix and transferred  to [facility].   On arrival to [facility] she remained volume overloaded. Diuresed well with IV lasix. Echo showed EF <20% and severe MR/TR. Concern for demand ischemia vs nSTEMI (completed 48hrs heparin). L/RHC today showed low output biventricular HR with volume overload. AHF team to start inotropic support.   In bed, daughter at bedside. SOB has much improved since being diuresed. Denies CP.   RHC today:  RA = 22 RV = 49/22 PA = 49/26 (33) PCW = 23  Fick cardiac output/index = 6.1/2.6 TD CO/CI = 4.1/1.7 PVR = 1.6 (Fick) 2.5 (TD) Ao sat = 99% PA sat = 64%, 66% SVC sat = 65% PaPI = 1.0 1. Low output biventricular HF (R>L) with volume overload (suspect TD CO is most accurate)   Echo this admission with EF <20%, LV with global hypokineses, LV severely dilated, GIIDD, elevated LVEDP, mildly reduced RV function, normal RV size and PASP, mildly dilated LA and RA. Mod-sev MR, severe TR, aortic valve regurgitation is trivial. Mod pulmonic valve regurgitation.    Home Medications Prior to Admission medications   Medication Sig Start Date End Date Taking? Authorizing Provider  hydrochlorothiazide (HYDRODIURIL) 25 MG tablet Take 1 tablet (25 mg total) by mouth daily. 08/21/22  Yes Abernathy, Yetta Flock, NP  Multiple Vitamin (MULTIVITAMIN) capsule Take 1 capsule by mouth daily.   Yes [provider]  pantoprazole (PROTONIX) 40 MG tablet TAKE 1 TABLET(40 MG) BY MOUTH DAILY Patient taking differently: Take 40 mg by mouth See admin instructions. TAKE 1 TABLET(40 MG) BY MOUTH DAILY 01/23/22  Yes Jonetta Osgood, NP    Past Medical History: Past Medical History:  Diagnosis Date   Allergy    seasonal   Anemia    Arthritis    right hip   Gastritis    Headache    sinus/allergies   Hypertension    Seasonal allergies    Vertigo    1-2x/month   Wears contact lenses     Past Surgical History: Past Surgical History:  Procedure Laterality Date   COLONOSCOPY N/A 05/03/2015   Procedure:  COLONOSCOPY;  Surgeon: Lucilla Lame, MD;  Location: [place];  Service: Gastroenterology;  Laterality: N/A;   COLONOSCOPY     COLONOSCOPY WITH PROPOFOL N/A 02/22/2021   Procedure: COLONOSCOPY WITH PROPOFOL;  Surgeon: Lucilla Lame, MD;  Location: [facility];  Service: Endoscopy;  Laterality: N/A;   ESOPHAGOGASTRODUODENOSCOPY N/A 05/03/2015   Procedure: ESOPHAGOGASTRODUODENOSCOPY (EGD);  Surgeon: Lucilla Lame, MD;  Location: [place];  Service: Gastroenterology;  Laterality: N/A;   FOOT SURGERY  2014   Gastric bypass surgery     POLYPECTOMY  05/03/2015   Procedure: POLYPECTOMY INTESTINAL;  Surgeon: Lucilla Lame, MD;  Location: [place];  Service: Gastroenterology;;    Family History: Family History  Problem Relation Age of Onset   Heart failure Mother    Diabetes Mother    Hypertension Mother    Cancer Father    Diabetes Father    Hypertension Father    Heart failure Brother 61    Social History: Social History   Socioeconomic History   Marital status: Single    Spouse name: Not on file   Number of children: 1   Years of education: Not on file   Highest education level: Bachelor's degree (e.g., BA, AB, BS)  Occupational History   Occupation: Manufacturing engineer  Tobacco Use   Smoking status: Never   Smokeless tobacco: Never  Vaping Use   Vaping Use: Never used  Substance and Sexual Activity   Alcohol use: No   Drug use: No   Sexual activity: Not Currently  Other Topics Concern   Not on file  Social History Narrative   Not on file   Social Determinants of Health   Financial Resource Strain: Low Risk  (12/05/2022)   Overall Financial Resource Strain (CARDIA)    Difficulty of Paying Living Expenses: Not very hard  Food Insecurity: Not on file  Transportation Needs: No Transportation Needs (12/05/2022)   PRAPARE - Hydrologist (Medical): No    Lack of Transportation (Non-Medical): No  Physical Activity:  Not on file  Stress: Not on file  Social Connections: Not on file    Allergies:  Allergies  Allergen Reactions   Bisoprolol Diarrhea   Penicillin G Itching   Penicillins Itching    Objective:    Vital Signs:   Temp:  [97.3 F (36.3 C)-98.2 F (36.8 C)] 98.2 F (36.8 C) (12/29 1408) Pulse Rate:  [71-79] 76 (12/29 1408) Resp:  [15-24] 19 (12/29 1408) BP: (91-117)/(55-77) 103/55 (12/29 1408) SpO2:  [96 %-99 %] 96 % (12/29 1408) Weight:  [138.7 kg] 138.7 kg (12/29 0441) Last BM Date : 12/04/22  Weight change: Filed Weights   12/04/22 0453 12/05/22 0507 12/06/22 0441  Weight: (!) 139.5 kg (!) 140 kg (!) 138.7 kg    Intake/Output:   Intake/Output Summary (Last 24 hours) at 12/06/2022 1540 Last data filed at 12/06/2022 1438 Gross per 24 hour  Intake 983.34 ml  Output 1400 ml  Net -416.66 ml      Physical Exam    General:  well appearing.  No respiratory difficulty HEENT: normal Neck: supple. JVD to jaw. Carotids 2+ bilat; no bruits. No lymphadenopathy or thyromegaly appreciated. Cor: PMI nondisplaced. Regular rate & rhythm. No rubs, gallops, MR/TR murmur Lungs: clear Abdomen: soft, nontender, nondistended. No hepatosplenomegaly. No bruits or masses. Good bowel sounds. Extremities: no cyanosis, clubbing, rash, +2/3 BLE edema  Neuro: alert & oriented x 3, cranial nerves grossly intact. moves all 4 extremities w/o difficulty. Affect pleasant.   Telemetry   NSR 70s (Personally reviewed)    EKG   ST w/ PVCs 103bpm  Labs   Basic Metabolic Panel: Recent Labs  Lab 12/02/22 1440 12/03/22 0137 12/04/22 0205 12/05/22 0148 12/06/22 0526  NA 138 138 137 136 136  K 3.5 5.0 4.5 4.6 3.8  CL 104 105 100 100 94*  CO2 '26 23 27 26 29  '$ GLUCOSE 115* 125* 113* 107* 116*  BUN 15 17 21* 26* 25*  CREATININE 1.18* 1.48* 2.02* 2.10* 2.01*  CALCIUM 9.2 9.3 9.6 9.7 9.8  MG 2.5* 2.3 2.1 2.1 2.2  PHOS  --  2.8 3.3  --   --     Liver Function Tests: Recent Labs  Lab  11/30/22 1926 12/03/22 0137 12/04/22 0205 12/05/22 0148 12/06/22 0526  AST 38 42* 45* 53* 74*  ALT 48* 47* 55* 64* 87*  ALKPHOS 87 79 110 154* 189*  BILITOT 1.5* 2.3* 2.0* 1.9* 2.0*  PROT 6.9 6.9 6.7 6.4* 7.2  ALBUMIN 3.3* 3.4* 3.4* 3.3* 3.6   Recent Labs  Lab 11/30/22 1926  LIPASE 43   No results for input(s): "AMMONIA" in the last 168 hours.  CBC: Recent Labs  Lab 12/02/22 0103 12/03/22 0137 12/04/22 0205 12/05/22 0148 12/06/22 0526  WBC 11.2* 11.7* 11.2* 12.4* 10.6*  NEUTROABS  --  8.5* 7.3 8.7* 6.9  HGB 10.9* 10.4* 10.5* 10.4* 10.8*  HCT 34.9* 34.9* 34.0* 34.6* 34.3*  MCV 70.9* 72.4* 71.1* 71.6* 69.7*  PLT 373 332 338 295 318    Cardiac Enzymes: No results for input(s): "CKTOTAL", "CKMB", "CKMBINDEX", "TROPONINI" in the last 168 hours.  BNP: BNP (last 3 results) Recent Labs    11/30/22 2119  BNP 777.6*    ProBNP (last 3 results) No results for input(s): "PROBNP" in the last 8760 hours.   CBG: No results for input(s): "GLUCAP" in the last 168 hours.  Coagulation Studies: No results for input(s): "LABPROT", "INR" in the last 72 hours.   Imaging   [place] EKG SITE RITE  Result Date: 12/06/2022 If Site Rite image not attached, placement could not be confirmed due to current cardiac rhythm.    Medications:     Current Medications:  [MAR Hold] aspirin  81 mg Oral Daily   [MAR Hold] atorvastatin  40 mg Oral Daily   [MAR Hold] carvedilol  3.125 mg Oral BID WC   [MAR Hold] docusate sodium  100 mg Oral BID   [MAR Hold] empagliflozin  10 mg Oral Daily   [MAR Hold] heparin injection (subcutaneous)  5,000 Units Subcutaneous Q8H   [MAR Hold] multivitamin with minerals  1 tablet Oral Daily   [MAR Hold] pantoprazole  40 mg Oral Daily   [MAR Hold] Ensure Max Protein  11 oz Oral BID   [MAR Hold] sodium chloride flush  3 mL Intravenous Q12H   [MAR Hold] sodium chloride flush  3 mL Intravenous Q12H   [MAR Hold] spironolactone  25 mg Oral Daily     Infusions:  sodium chloride     sodium  chloride 10 mL/hr at 12/06/22 0530   [MAR Hold] ferric gluconate (FERRLECIT) IVPB 125 mg (12/06/22 1235)   [MAR Hold] furosemide 120 mg (12/06/22 0913)      Patient Profile   Ms. Haraway is a 56 y.o. female with HTN and morbid obesity s/p bypass surgery. AHF team asked to see for acute combined systolic and diastolic heart failure.   Assessment/Plan  Acute combined systolic and diastolic heart failure - Echo this admission with EF <20%, LV with global hypokineses, LV severely dilated, GIIDD, elevated LVEDP, mildly reduced RV function, normal RV size and PASP, mildly dilated LA and RA. Mod-sev MR, severe TR, aortic valve regurgitation is trivial. Mod pulmonic valve regurgitation.   - Presented NYHA IV, up 34lbs within last 3 months. Suspect 2/2 LBBB? HTN? Viral? (Recent bronchitis/PNA) - Remains volume overloaded, continue with IV diuresis.  - RHC today showed: Low output biventricular HF (R>L) with volume overload. TD CO/CI = 4.1/1.7 - Place PICC, start milrinone .25 - Trend daily Co-ox and CVP - Continue Jardiance, HgbA1c 6.4 - Continue spironolactone  - will add losartan once BP allows - Hold BB with low-output HF - Place UNNA boots - Anemia panel: tSat 2, Ferritin 11. Ferritin after all imaging complete, cMRI early next week - strict I&O, daily weight  Elevated troponin - Demand ischemia vs nSTEMI - HsTrop 895>917>720>397>382 - LHC pending renal improvement  - Completed 48 hrs hep gtt - Continue ASA/statin  Severe TR/MR - seen on echo  LBBB - could be contributing to CM  Hypertension  - Per recent notes BP has been stable during PCP visits - BP stable, continue regimen as above. Will add GDMT as able  AKI - Baseline ~1 - Up to 2.1 with diuresis/contrast. More than likely with low-output HF - Diuretics briefly held, Cr downtrending. 2.01 today   Length of Stay: Vass, NP  12/06/2022, 3:40 PM  Advanced  Heart Failure Team Pager [phone] (M-F; 7a - 5p)  Please contact [facility] for night-coverage after hours (4p -7a ) and weekends on [email or web address]  Patient seen and examined with the above-signed Advanced Practice Provider and/or Housestaff. I personally reviewed laboratory data, imaging studies and relevant notes. I independently examined the patient and formulated the important aspects of the plan. I have edited the note to reflect any of my changes or salient points. I have personally discussed the plan with the patient and/or family.  56 y/o woman with HTN, morbid obesity s/p bariatric surgery, LBBB (130-140 ms) admitted with 3 months HF symptoms. Hstrop mildly elevated [phone]. Echo EF 20% with dyssynchrony, RV moderately down. Moderate to severe MR.   Had CT chest. No PE. Subsequently developed AKI and inability to diurese.   Remains SOB with minimal activity. + orthopnea. No CP.   RHC today with markedly elevated filling pressures and low output with RV > LV failure  General:  Sitting up in bed.  No resp difficulty HEENT: normal Neck: supple. JVP to ear . Carotids 2+ bilat; no bruits. No lymphadenopathy or thryomegaly appreciated. Cor: Regular rate & rhythm. 3/6 MR. Lungs: clear Abdomen: obese soft, nontender, nondistended. No hepatosplenomegaly. No bruits or masses. Good bowel sounds. Extremities: no cyanosis, clubbing, rash, 1-2+ edema Neuro: alert & orientedx3, cranial nerves grossly intact. moves all 4 extremities w/o difficulty. Affect pleasant  She has low output biventricular HF c/b AKI (ATN +/- CIN).  Carvedilol stopped. Will add milrinone and continue IV diuresis. Will need cMRI next week. Eventual ischemic  eval as needed. 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   Glori Bickers, MD  5:46 PM

## 2022-12-07 DIAGNOSIS — I5021 Acute systolic (congestive) heart failure: Secondary | ICD-10-CM | POA: Diagnosis not present

## 2022-12-07 LAB — CBC WITH DIFFERENTIAL/PLATELET
Abs Immature Granulocytes: 0.03 10*3/uL (ref 0.00–0.07)
Basophils Absolute: 0 10*3/uL (ref 0.0–0.1)
Basophils Relative: 0 %
Eosinophils Absolute: 0.1 10*3/uL (ref 0.0–0.5)
Eosinophils Relative: 1 %
HCT: 30.5 % — ABNORMAL LOW (ref 36.0–46.0)
Hemoglobin: 9.4 g/dL — ABNORMAL LOW (ref 12.0–15.0)
Immature Granulocytes: 0 %
Lymphocytes Relative: 14 %
Lymphs Abs: 1.7 10*3/uL (ref 0.7–4.0)
MCH: 21.8 pg — ABNORMAL LOW (ref 26.0–34.0)
MCHC: 30.8 g/dL (ref 30.0–36.0)
MCV: 70.8 fL — ABNORMAL LOW (ref 80.0–100.0)
Monocytes Absolute: 1.7 10*3/uL — ABNORMAL HIGH (ref 0.1–1.0)
Monocytes Relative: 14 %
Neutro Abs: 8.8 10*3/uL — ABNORMAL HIGH (ref 1.7–7.7)
Neutrophils Relative %: 71 %
Platelets: 303 10*3/uL (ref 150–400)
RBC: 4.31 MIL/uL (ref 3.87–5.11)
RDW: 17.5 % — ABNORMAL HIGH (ref 11.5–15.5)
WBC: 12.3 10*3/uL — ABNORMAL HIGH (ref 4.0–10.5)
nRBC: 0 % (ref 0.0–0.2)

## 2022-12-07 LAB — MAGNESIUM: Magnesium: 1.9 mg/dL (ref 1.7–2.4)

## 2022-12-07 LAB — COMPREHENSIVE METABOLIC PANEL
ALT: 73 U/L — ABNORMAL HIGH (ref 0–44)
AST: 53 U/L — ABNORMAL HIGH (ref 15–41)
Albumin: 3.4 g/dL — ABNORMAL LOW (ref 3.5–5.0)
Alkaline Phosphatase: 145 U/L — ABNORMAL HIGH (ref 38–126)
Anion gap: 15 (ref 5–15)
BUN: 21 mg/dL — ABNORMAL HIGH (ref 6–20)
CO2: 30 mmol/L (ref 22–32)
Calcium: 9.6 mg/dL (ref 8.9–10.3)
Chloride: 93 mmol/L — ABNORMAL LOW (ref 98–111)
Creatinine, Ser: 1.77 mg/dL — ABNORMAL HIGH (ref 0.44–1.00)
GFR, Estimated: 33 mL/min — ABNORMAL LOW (ref >60)
Glucose, Bld: 96 mg/dL (ref 70–99)
Potassium: 3.4 mmol/L — ABNORMAL LOW (ref 3.5–5.1)
Sodium: 138 mmol/L (ref 135–145)
Total Bilirubin: 1.7 mg/dL — ABNORMAL HIGH (ref 0.3–1.2)
Total Protein: 6.6 g/dL (ref 6.5–8.1)

## 2022-12-07 LAB — COOXEMETRY PANEL
Carboxyhemoglobin: 1.4 % (ref 0.5–1.5)
Methemoglobin: 1 % (ref 0.0–1.5)
O2 Saturation: 65.5 %
Total hemoglobin: 9.8 g/dL — ABNORMAL LOW (ref 12.0–16.0)

## 2022-12-07 MED ORDER — DIGOXIN 125 MCG PO TABS
0.1250 mg | ORAL_TABLET | Freq: Every day | ORAL | Status: DC
  Administered 2022-12-07 – 2022-12-11 (×5): 0.125 mg via ORAL
  Filled 2022-12-07 (×5): qty 1

## 2022-12-07 MED ORDER — POTASSIUM CHLORIDE CRYS ER 20 MEQ PO TBCR
40.0000 meq | EXTENDED_RELEASE_TABLET | Freq: Once | ORAL | Status: AC
  Administered 2022-12-07: 40 meq via ORAL
  Filled 2022-12-07: qty 2

## 2022-12-07 NOTE — Progress Notes (Signed)
Orthopedic Tech Progress Note Patient Details:  Stephanie Gates Nov 18, 1966 [id number]  Ortho Devices Type of Ortho Device: Louretta Parma boot Ortho Device/Splint Location: Bi LE Ortho Device/Splint Interventions: Application   Post Interventions Patient Tolerated: Well  Stephanie Gates [name] 12/07/2022, 11:14 AM

## 2022-12-07 NOTE — Progress Notes (Signed)
Patient ID: Stephanie Gates, female   DOB: 03/12/1966, 56 y.o.   MRN: [id number]     Advanced Heart Failure Rounding Note  PCP-Cardiologist: None   Subjective:    Excellent diuresis overnight, weight down 13 lbs. Co-ox 66% today on milrinone 0.25.  CVP not set up.  Creatinine lower at 1.77.   Breathing better.   RHC:  RA = 22 RV = 49/22 PA = 49/26 (33) PCW = 23  Fick cardiac output/index = 6.1/2.6 TD CO/CI = 4.1/1.7 PVR = 1.6 (Fick) 2.5 (TD) Ao sat = 99% PA sat = 64%, 66% SVC sat = 65% PaPI = 1.0  Echo: EF < 20%, severe LV dilation, mildly decreased RV systolic function, moderate-severe functional MR, severe TR.    Objective:   Weight Range: 132.7 kg Body mass index is 48.68 kg/m.   Vital Signs:   Temp:  [97.5 F (36.4 C)-98.2 F (36.8 C)] 97.5 F (36.4 C) (12/30 0904) Pulse Rate:  [72-94] 92 (12/30 0904) Resp:  [14-25] 16 (12/30 0904) BP: (102-136)/(55-80) 119/57 (12/30 0904) SpO2:  [96 %-100 %] 96 % (12/30 0904) Weight:  [132.7 kg] 132.7 kg (12/30 0535) Last BM Date : 12/04/22  Weight change: Filed Weights   12/05/22 0507 12/06/22 0441 12/07/22 0535  Weight: (!) 140 kg (!) 138.7 kg 132.7 kg    Intake/Output:   Intake/Output Summary (Last 24 hours) at 12/07/2022 1100 Last data filed at 12/07/2022 0535 Gross per 24 hour  Intake 509.31 ml  Output 3400 ml  Net -2890.69 ml      Physical Exam    General:  Well appearing. No resp difficulty HEENT: Normal Neck: Supple. JVP 16+ cm. Carotids 2+ bilat; no bruits. No lymphadenopathy or thyromegaly appreciated. Cor: Lateral PMI. Regular rate & rhythm. 2/6 HSM LLSB/apex.  Lungs: Clear Abdomen: Soft, nontender, nondistended. No hepatosplenomegaly. No bruits or masses. Good bowel sounds. Extremities: No cyanosis, clubbing, rash. 1+ edema to knees.  Neuro: Alert & orientedx3, cranial nerves grossly intact. moves all 4 extremities w/o difficulty. Affect pleasant   Telemetry   NSR with occasional PVCs  (personally reviewed)   Labs    CBC Recent Labs    12/06/22 0526 12/06/22 1545 12/06/22 1549 12/07/22 0535  WBC 10.6*  --   --  12.3*  NEUTROABS 6.9  --   --  8.8*  HGB 10.8*   < > 10.9* 9.4*  HCT 34.3*   < > 32.0* 30.5*  MCV 69.7*  --   --  70.8*  PLT 318  --   --  303   < > = values in this interval not displayed.   Basic Metabolic Panel Recent Labs    12/06/22 0526 12/06/22 1545 12/06/22 1549 12/07/22 0535  NA 136   < > 138 138  K 3.8   < > 3.4* 3.4*  CL 94*  --   --  93*  CO2 29  --   --  30  GLUCOSE 116*  --   --  96  BUN 25*  --   --  21*  CREATININE 2.01*  --   --  1.77*  CALCIUM 9.8  --   --  9.6  MG 2.2  --   --  1.9   < > = values in this interval not displayed.   Liver Function Tests Recent Labs    12/06/22 0526 12/07/22 0535  AST 74* 53*  ALT 87* 73*  ALKPHOS 189* 145*  BILITOT 2.0* 1.7*  PROT  7.2 6.6  ALBUMIN 3.6 3.4*   No results for input(s): "LIPASE", "AMYLASE" in the last 72 hours. Cardiac Enzymes No results for input(s): "CKTOTAL", "CKMB", "CKMBINDEX", "TROPONINI" in the last 72 hours.  BNP: BNP (last 3 results) Recent Labs    11/30/22 2119  BNP 777.6*    ProBNP (last 3 results) No results for input(s): "PROBNP" in the last 8760 hours.   D-Dimer No results for input(s): "DDIMER" in the last 72 hours. Hemoglobin A1C No results for input(s): "HGBA1C" in the last 72 hours. Fasting Lipid Panel No results for input(s): "CHOL", "HDL", "LDLCALC", "TRIG", "CHOLHDL", "LDLDIRECT" in the last 72 hours. Thyroid Function Tests No results for input(s): "TSH", "T4TOTAL", "T3FREE", "THYROIDAB" in the last 72 hours.  Invalid input(s): "FREET3"  Other results:   Imaging    [place] EKG SITE RITE  Result Date: 12/06/2022 If Site Rite image not attached, placement could not be confirmed due to current cardiac rhythm.  CARDIAC CATHETERIZATION  Result Date: 12/06/2022 Findings: RA = 22 RV = 49/22 PA = 49/26 (33) PCW = 23 Fick cardiac  output/index = 6.1/2.6 TD CO/CI = 4.1/1.7 PVR = 1.6 (Fick) 2.5 (TD) Ao sat = 99% PA sat = 64%, 66% SVC sat = 65% PaPI = 1.0 Assessment: 1. Low output biventricular HF (R>L) with volume overload (suspect TD CO is most accurate) Plan/Discussion: Will place PICC, Start milrinone. Diurese with lasix 80 IV bid. Hold b-blocker. Plan cMRI next week (suspect LBBB CM), Can consider ischemic eval when kidneys better but likely NICM. Glori Bickers, MD 4:10 PM    Medications:     Scheduled Medications:  aspirin  81 mg Oral Daily   atorvastatin  40 mg Oral Daily   Chlorhexidine Gluconate Cloth  6 each Topical Daily   docusate sodium  100 mg Oral BID   empagliflozin  10 mg Oral Daily   enoxaparin (LOVENOX) injection  40 mg Subcutaneous Q24H   multivitamin with minerals  1 tablet Oral Daily   pantoprazole  40 mg Oral Daily   Ensure Max Protein  11 oz Oral BID   sodium chloride flush  10-40 mL Intracatheter Q12H   sodium chloride flush  3 mL Intravenous Q12H   sodium chloride flush  3 mL Intravenous Q12H   sodium chloride flush  3 mL Intravenous Q12H   spironolactone  25 mg Oral Daily    Infusions:  sodium chloride     ferric gluconate (FERRLECIT) IVPB 125 mg (12/06/22 1235)   furosemide 120 mg (12/07/22 1006)   milrinone 0.25 mcg/kg/min (12/07/22 0311)    PRN Medications: sodium chloride, acetaminophen, albuterol, alum & mag hydroxide-simeth, bisacodyl, guaiFENesin-dextromethorphan, hydrALAZINE, nitroGLYCERIN, ondansetron (ZOFRAN) IV, ondansetron **OR** [DISCONTINUED] ondansetron (ZOFRAN) IV, oxyCODONE, polyethylene glycol, sodium chloride flush, sodium chloride flush, traZODone    Assessment/Plan   1. Acute on chronic systolic CHF: Echo this admission with EF < 20%, severe LV dilation, mildly decreased RV systolic function, moderate-severe functional MR, severe TR.  RHC with low output and biventricular failure.  NYHA class IV at admission, weight up > 30 lbs last few months.  Uncertain  cause of cardiomyopathy, ?LBBB vs HTN vs viral myocarditis.  LBBB is not markedly wide but dyssynchrony on echo. Milrinone started, co-ox 66% today.  CVP not set up but she looks volume overloaded still.  Good diuresis with current Lasix. Creatinine trending down.  - Continue milrinone 0.25 while diuresing.  - Add digoxin 0.125 daily.  - Follow CVP off PICC.  - Continue Lasix 120  mg IV bid.  - Continue Jardiance 10 mg daily.  - Continue spironolactone 25 daily.  - Can likely start Entresto tomorrow if creatinine continues to come down.  - Unna boots.  - Has received 2 doses of Ferrlicit, so will have to wait for cardiac MRI.  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  2. Elevated Hs-TnI: 895>917>720>397>382.   - Completed 48 hrs heparin gtt.  - Continue ASA/statin.  - Cath when creatinine down.  3. 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  Follow with diuresis and GDMT.  4. 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   5. HTN: BP not elevated 6. AKI: Suspect cardiorenal, baseline creatinine around 1.  Creatinine up to 2.1, now down to 1.77.  - Continue milrinone for now.  - Follow creatinine with diuresis.  7. Elevated LFTs: Suspect congestive hepatopathy, improving.   Length of Stay: 6  Loralie Champagne, MD  12/07/2022, 11:00 AM  Advanced Heart Failure Team Pager [phone] (M-F; 7a - 5p)  Please contact [facility] for night-coverage after hours (5p -7a ) and weekends on [email or web address]

## 2022-12-08 DIAGNOSIS — I5021 Acute systolic (congestive) heart failure: Secondary | ICD-10-CM | POA: Diagnosis not present

## 2022-12-08 LAB — CBC WITH DIFFERENTIAL/PLATELET
Abs Immature Granulocytes: 0.05 10*3/uL (ref 0.00–0.07)
Basophils Absolute: 0 10*3/uL (ref 0.0–0.1)
Basophils Relative: 0 %
Eosinophils Absolute: 0.1 10*3/uL (ref 0.0–0.5)
Eosinophils Relative: 1 %
HCT: 29.4 % — ABNORMAL LOW (ref 36.0–46.0)
Hemoglobin: 9.3 g/dL — ABNORMAL LOW (ref 12.0–15.0)
Immature Granulocytes: 0 %
Lymphocytes Relative: 13 %
Lymphs Abs: 1.6 10*3/uL (ref 0.7–4.0)
MCH: 21.9 pg — ABNORMAL LOW (ref 26.0–34.0)
MCHC: 31.6 g/dL (ref 30.0–36.0)
MCV: 69.2 fL — ABNORMAL LOW (ref 80.0–100.0)
Monocytes Absolute: 1.7 10*3/uL — ABNORMAL HIGH (ref 0.1–1.0)
Monocytes Relative: 14 %
Neutro Abs: 8.5 10*3/uL — ABNORMAL HIGH (ref 1.7–7.7)
Neutrophils Relative %: 72 %
Platelets: 258 10*3/uL (ref 150–400)
RBC: 4.25 MIL/uL (ref 3.87–5.11)
RDW: 18.1 % — ABNORMAL HIGH (ref 11.5–15.5)
WBC: 12 10*3/uL — ABNORMAL HIGH (ref 4.0–10.5)
nRBC: 0 % (ref 0.0–0.2)

## 2022-12-08 LAB — BASIC METABOLIC PANEL
Anion gap: 14 (ref 5–15)
BUN: 15 mg/dL (ref 6–20)
CO2: 32 mmol/L (ref 22–32)
Calcium: 9.2 mg/dL (ref 8.9–10.3)
Chloride: 92 mmol/L — ABNORMAL LOW (ref 98–111)
Creatinine, Ser: 1.49 mg/dL — ABNORMAL HIGH (ref 0.44–1.00)
GFR, Estimated: 41 mL/min — ABNORMAL LOW (ref >60)
Glucose, Bld: 93 mg/dL (ref 70–99)
Potassium: 3.2 mmol/L — ABNORMAL LOW (ref 3.5–5.1)
Sodium: 138 mmol/L (ref 135–145)

## 2022-12-08 LAB — COOXEMETRY PANEL
Carboxyhemoglobin: 1.8 % — ABNORMAL HIGH (ref 0.5–1.5)
Methemoglobin: <0.7 % (ref 0.0–1.5)
O2 Saturation: 78.7 %
Total hemoglobin: 9.6 g/dL — ABNORMAL LOW (ref 12.0–16.0)

## 2022-12-08 LAB — MAGNESIUM: Magnesium: 1.9 mg/dL (ref 1.7–2.4)

## 2022-12-08 MED ORDER — MAGNESIUM SULFATE 2 GM/50ML IV SOLN
2.0000 g | Freq: Once | INTRAVENOUS | Status: AC
  Administered 2022-12-08: 2 g via INTRAVENOUS
  Filled 2022-12-08: qty 50

## 2022-12-08 MED ORDER — LOSARTAN POTASSIUM 25 MG PO TABS
25.0000 mg | ORAL_TABLET | Freq: Every day | ORAL | Status: DC
  Administered 2022-12-08: 25 mg via ORAL
  Filled 2022-12-08 (×2): qty 1

## 2022-12-08 MED ORDER — POTASSIUM CHLORIDE 20 MEQ PO PACK
40.0000 meq | PACK | Freq: Two times a day (BID) | ORAL | Status: AC
  Administered 2022-12-08 (×2): 40 meq via ORAL
  Filled 2022-12-08 (×2): qty 2

## 2022-12-08 NOTE — Progress Notes (Signed)
PT Cancellation Note  Patient Details Name: Stephanie Gates MRN: [id number] DOB: 04/29/1966   Cancelled Treatment:    Reason Eval/Treat Not Completed: Other (comment) Pt requesting to visit with multiple family members in the room. Reports she has been ambulating back and forth to the bathroom.  Wyona Almas, PT, DPT Acute Rehabilitation Services Office [place] 12/08/2022, 3:25 PM

## 2022-12-08 NOTE — Progress Notes (Signed)
Pt has some firmness and tenderness in right arm where previous IV was.  Pt has also developed firmness and tenderness above current L arm IV site. L arm Iv removed.  Pharmacy consulted about IV medication. Warm compresses applied.  Pt stated that the tenderness in both sites was present overnight, but that she did not inform night nurse.  Will monitor both sites closely.

## 2022-12-08 NOTE — Progress Notes (Signed)
MD notified about the persistent low blood pressure 80/38 ,advised  continue to hold Milrinone,Patient is asymptomatic,will continue to monitor.

## 2022-12-08 NOTE — Progress Notes (Signed)
Patient blood pressure dropped to 80/40,patient remains asymptomatic,milrinone hold.MD paged.Advice to hold Milrinone for now and continue watching for blood pressure.

## 2022-12-08 NOTE — Progress Notes (Signed)
Patient ID: Stephanie Gates, female   DOB: 08/12/1966, 56 y.o.   MRN: [id number]     Advanced Heart Failure Rounding Note  PCP-Cardiologist: None   Subjective:    Excellent diuresis again, weight down 9 lbs. Co-ox 79% today on milrinone 0.25.  CVP 15-16.  Creatinine lower at 1.77 => 1.49.   Breathing better.   RHC:  RA = 22 RV = 49/22 PA = 49/26 (33) PCW = 23  Fick cardiac output/index = 6.1/2.6 TD CO/CI = 4.1/1.7 PVR = 1.6 (Fick) 2.5 (TD) Ao sat = 99% PA sat = 64%, 66% SVC sat = 65% PaPI = 1.0  Echo: EF < 20%, severe LV dilation, mildly decreased RV systolic function, moderate-severe functional MR, severe TR.    Objective:   Weight Range: 128.5 kg Body mass index is 47.14 kg/m.   Vital Signs:   Temp:  [97.6 F (36.4 C)-98.2 F (36.8 C)] 98 F (36.7 C) (12/31 0909) Pulse Rate:  [87-94] 92 (12/31 0909) Resp:  [14-22] 14 (12/31 0909) BP: (98-123)/(56-73) 104/73 (12/31 0909) SpO2:  [93 %-97 %] 97 % (12/31 0909) Weight:  [128.5 kg] 128.5 kg (12/31 0435) Last BM Date : 12/04/22  Weight change: Filed Weights   12/06/22 0441 12/07/22 0535 12/08/22 0435  Weight: (!) 138.7 kg 132.7 kg 128.5 kg    Intake/Output:   Intake/Output Summary (Last 24 hours) at 12/08/2022 1015 Last data filed at 12/08/2022 0631 Gross per 24 hour  Intake 1096.47 ml  Output 5850 ml  Net -4753.53 ml      Physical Exam    General: NAD Neck: JVP 16 cm, no thyromegaly or thyroid nodule.  Lungs: Clear to auscultation bilaterally with normal respiratory effort. CV: Lateral PMI.  Heart regular S1/S2, no S3/S4, 2/6 HSM LLSB/apex.  1+ edema to knees.  Abdomen: Soft, nontender, no hepatosplenomegaly, no distention.  Skin: Intact without lesions or rashes.  Neurologic: Alert and oriented x 3.  Psych: Normal affect. Extremities: No clubbing or cyanosis.  HEENT: Normal.    Telemetry   NSR with occasional PVCs (personally reviewed)   Labs    CBC Recent Labs    12/07/22 0535  12/08/22 0502  WBC 12.3* 12.0*  NEUTROABS 8.8* 8.5*  HGB 9.4* 9.3*  HCT 30.5* 29.4*  MCV 70.8* 69.2*  PLT 303 336   Basic Metabolic Panel Recent Labs    12/07/22 0535 12/08/22 0502  NA 138 138  K 3.4* 3.2*  CL 93* 92*  CO2 30 32  GLUCOSE 96 93  BUN 21* 15  CREATININE 1.77* 1.49*  CALCIUM 9.6 9.2  MG 1.9 1.9   Liver Function Tests Recent Labs    12/06/22 0526 12/07/22 0535  AST 74* 53*  ALT 87* 73*  ALKPHOS 189* 145*  BILITOT 2.0* 1.7*  PROT 7.2 6.6  ALBUMIN 3.6 3.4*   No results for input(s): "LIPASE", "AMYLASE" in the last 72 hours. Cardiac Enzymes No results for input(s): "CKTOTAL", "CKMB", "CKMBINDEX", "TROPONINI" in the last 72 hours.  BNP: BNP (last 3 results) Recent Labs    11/30/22 2119  BNP 777.6*    ProBNP (last 3 results) No results for input(s): "PROBNP" in the last 8760 hours.   D-Dimer No results for input(s): "DDIMER" in the last 72 hours. Hemoglobin A1C No results for input(s): "HGBA1C" in the last 72 hours. Fasting Lipid Panel No results for input(s): "CHOL", "HDL", "LDLCALC", "TRIG", "CHOLHDL", "LDLDIRECT" in the last 72 hours. Thyroid Function Tests No results for input(s): "TSH", "T4TOTAL", "T3FREE", "  THYROIDAB" in the last 72 hours.  Invalid input(s): "FREET3"  Other results:   Imaging    No results found.   Medications:     Scheduled Medications:  aspirin  81 mg Oral Daily   atorvastatin  40 mg Oral Daily   Chlorhexidine Gluconate Cloth  6 each Topical Daily   digoxin  0.125 mg Oral Daily   docusate sodium  100 mg Oral BID   empagliflozin  10 mg Oral Daily   enoxaparin (LOVENOX) injection  40 mg Subcutaneous Q24H   losartan  25 mg Oral Daily   multivitamin with minerals  1 tablet Oral Daily   pantoprazole  40 mg Oral Daily   potassium chloride  40 mEq Oral BID   Ensure Max Protein  11 oz Oral BID   sodium chloride flush  10-40 mL Intracatheter Q12H   sodium chloride flush  3 mL Intravenous Q12H   sodium  chloride flush  3 mL Intravenous Q12H   sodium chloride flush  3 mL Intravenous Q12H   spironolactone  25 mg Oral Daily    Infusions:  sodium chloride     ferric gluconate (FERRLECIT) IVPB 125 mg (12/07/22 1234)   furosemide 120 mg (12/08/22 0931)   magnesium sulfate bolus IVPB     milrinone 0.25 mcg/kg/min (12/08/22 0922)    PRN Medications: sodium chloride, acetaminophen, albuterol, alum & mag hydroxide-simeth, bisacodyl, guaiFENesin-dextromethorphan, hydrALAZINE, nitroGLYCERIN, ondansetron (ZOFRAN) IV, ondansetron **OR** [DISCONTINUED] ondansetron (ZOFRAN) IV, oxyCODONE, polyethylene glycol, sodium chloride flush, sodium chloride flush, traZODone    Assessment/Plan   1. Acute on chronic systolic CHF: Echo this admission with EF < 20%, severe LV dilation, mildly decreased RV systolic function, moderate-severe functional MR, severe TR.  RHC with low output and biventricular failure.  NYHA class IV at admission, weight up > 30 lbs last few months.  Uncertain cause of cardiomyopathy, ?LBBB vs HTN vs viral myocarditis.  LBBB is not markedly wide but dyssynchrony on echo. Milrinone started, co-ox 79% today.  CVP 15-16.  Good diuresis with current Lasix. Creatinine trending down, 1.49 today.  - Continue milrinone 0.25 while diuresing.  - Continue digoxin 0.125 daily.  - Continue Lasix 120 mg IV bid today.  - Continue Jardiance 10 mg daily.  - Continue spironolactone 25 daily.  - SBP 100s generally, will start losartan 25 mg daily.  If tolerates, transition to [employer].  - Unna boots.  - Has received 2 doses of Ferrlicit, so will have to wait for cardiac MRI.  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  2. Elevated Hs-TnI: 895>917>720>397>382.   - Completed 48 hrs heparin gtt.  - Continue ASA/statin.  - Cath when creatinine down.  3. 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  Follow with diuresis and GDMT.  4. LBBB: Possible LBBB  cardiomyopathy based on ECHO dyssynchrony but QRS not overly wide.   5. HTN: BP not elevated 6. AKI: Suspect cardiorenal, baseline creatinine around 1.  Creatinine up to 2.1, now down to 1.49.  - Continue milrinone for now.  - Follow creatinine with diuresis.  7. Elevated LFTs: Suspect congestive hepatopathy, improving.   Length of Stay: 7  Loralie Champagne, MD  12/08/2022, 10:15 AM  Advanced Heart Failure Team Pager [phone] (M-F; 7a - 5p)  Please contact [facility] for night-coverage after hours (5p -7a ) and weekends on [email or web address]

## 2022-12-09 DIAGNOSIS — I5021 Acute systolic (congestive) heart failure: Secondary | ICD-10-CM | POA: Diagnosis not present

## 2022-12-09 LAB — COOXEMETRY PANEL
Carboxyhemoglobin: 1.5 % (ref 0.5–1.5)
Methemoglobin: <0.7 % (ref 0.0–1.5)
O2 Saturation: 73.1 %
Total hemoglobin: 9.9 g/dL — ABNORMAL LOW (ref 12.0–16.0)

## 2022-12-09 LAB — BASIC METABOLIC PANEL
Anion gap: 13 (ref 5–15)
BUN: 13 mg/dL (ref 6–20)
CO2: 31 mmol/L (ref 22–32)
Calcium: 9.1 mg/dL (ref 8.9–10.3)
Chloride: 93 mmol/L — ABNORMAL LOW (ref 98–111)
Creatinine, Ser: 1.57 mg/dL — ABNORMAL HIGH (ref 0.44–1.00)
GFR, Estimated: 38 mL/min — ABNORMAL LOW (ref >60)
Glucose, Bld: 84 mg/dL (ref 70–99)
Potassium: 3.9 mmol/L (ref 3.5–5.1)
Sodium: 137 mmol/L (ref 135–145)

## 2022-12-09 LAB — CBC WITH DIFFERENTIAL/PLATELET
Abs Immature Granulocytes: 0.04 10*3/uL (ref 0.00–0.07)
Basophils Absolute: 0 10*3/uL (ref 0.0–0.1)
Basophils Relative: 0 %
Eosinophils Absolute: 0.2 10*3/uL (ref 0.0–0.5)
Eosinophils Relative: 2 %
HCT: 31.3 % — ABNORMAL LOW (ref 36.0–46.0)
Hemoglobin: 9.7 g/dL — ABNORMAL LOW (ref 12.0–15.0)
Immature Granulocytes: 0 %
Lymphocytes Relative: 15 %
Lymphs Abs: 1.6 10*3/uL (ref 0.7–4.0)
MCH: 22 pg — ABNORMAL LOW (ref 26.0–34.0)
MCHC: 31 g/dL (ref 30.0–36.0)
MCV: 71 fL — ABNORMAL LOW (ref 80.0–100.0)
Monocytes Absolute: 1.6 10*3/uL — ABNORMAL HIGH (ref 0.1–1.0)
Monocytes Relative: 15 %
Neutro Abs: 7 10*3/uL (ref 1.7–7.7)
Neutrophils Relative %: 68 %
Platelets: 310 10*3/uL (ref 150–400)
RBC: 4.41 MIL/uL (ref 3.87–5.11)
RDW: 19.1 % — ABNORMAL HIGH (ref 11.5–15.5)
WBC: 10.4 10*3/uL (ref 4.0–10.5)
nRBC: 0 % (ref 0.0–0.2)

## 2022-12-09 LAB — MAGNESIUM: Magnesium: 2.5 mg/dL — ABNORMAL HIGH (ref 1.7–2.4)

## 2022-12-09 MED ORDER — SPIRONOLACTONE 12.5 MG HALF TABLET
12.5000 mg | ORAL_TABLET | Freq: Every day | ORAL | Status: DC
  Administered 2022-12-09: 12.5 mg via ORAL
  Filled 2022-12-09: qty 1

## 2022-12-09 MED ORDER — POTASSIUM CHLORIDE CRYS ER 20 MEQ PO TBCR
40.0000 meq | EXTENDED_RELEASE_TABLET | Freq: Once | ORAL | Status: AC
  Administered 2022-12-09: 40 meq via ORAL
  Filled 2022-12-09: qty 2

## 2022-12-09 NOTE — Progress Notes (Signed)
Orthopedic Tech Progress Note Patient Details:  Stephanie Gates October 23, 1966 [id number]  Ortho Devices Type of Ortho Device: Haematologist Ortho Device/Splint Location: BLE Ortho Device/Splint Interventions: Application, Ordered   Post Interventions Patient Tolerated: Well  Adalina Dopson A [name] 12/09/2022, 7:05 PM

## 2022-12-09 NOTE — Progress Notes (Signed)
Patient ID: Stephanie Gates, female   DOB: [birth year], 57 y.o.   MRN: [id number]  PCP-Cardiologist: None   Subjective:    Weight down 3 lbs. Co-ox 75% today, milrinone was turned off overnight due to BP in the 80s.  CVP 13-14 today.  Creatinine 1.77 => 1.49 => 1.57. SBP 80s-90s.   Breathing better overall. No lightheadedness.   RHC:  RA = 22 RV = 49/22 PA = 49/26 (33) PCW = 23  Fick cardiac output/index = 6.1/2.6 TD CO/CI = 4.1/1.7 PVR = 1.6 (Fick) 2.5 (TD) Ao sat = 99% PA sat = 64%, 66% SVC sat = 65% PaPI = 1.0  Echo: EF < 20%, severe LV dilation, mildly decreased RV systolic function, moderate-severe functional MR, severe TR.    Objective:   Weight Range: 127.1 kg Body mass index is 46.63 kg/m.   Vital Signs:   Temp:  [97.3 F (36.3 C)-98.2 F (36.8 C)] 97.9 F (36.6 C) (01/01 0849) Pulse Rate:  [83-93] 83 (01/01 0849) Resp:  [14-20] 19 (01/01 1000) BP: (79-114)/(40-93) 84/55 (01/01 1000) SpO2:  [90 %-100 %] 100 % (01/01 0849) Weight:  [127.1 kg] 127.1 kg (01/01 0500) Last BM Date : 12/04/22  Weight change: Filed Weights   12/07/22 0535 12/08/22 0435 12/09/22 0500  Weight: 132.7 kg 128.5 kg 127.1 kg    Intake/Output:   Intake/Output Summary (Last 24 hours) at 12/09/2022 1049 Last data filed at 12/09/2022 0430 Gross per 24 hour  Intake 727.62 ml  Output 2250 ml  Net -1522.38 ml      Physical Exam    General: NAD Neck: JVP 14-16, no thyromegaly or thyroid nodule.  Lungs: Clear to auscultation bilaterally with normal respiratory effort. CV: Nondisplaced PMI.  Heart regular S1/S2, no S3/S4, 2/6 HSM LLSB/apex.  1+ edema to knees.  Abdomen: Soft, nontender, no hepatosplenomegaly, no distention.  Skin: Intact without lesions or rashes.  Neurologic: Alert and oriented x 3.  Psych: Normal affect. Extremities: No clubbing or cyanosis.  HEENT: Normal.    Telemetry   NSR with occasional PVCs (personally reviewed)   Labs    CBC Recent Labs     12/08/22 0502 12/09/22 0432  WBC 12.0* 10.4  NEUTROABS 8.5* 7.0  HGB 9.3* 9.7*  HCT 29.4* 31.3*  MCV 69.2* 71.0*  PLT 258 366   Basic Metabolic Panel Recent Labs    12/08/22 0502 12/09/22 0432  NA 138 137  K 3.2* 3.9  CL 92* 93*  CO2 32 31  GLUCOSE 93 84  BUN 15 13  CREATININE 1.49* 1.57*  CALCIUM 9.2 9.1  MG 1.9 2.5*   Liver Function Tests Recent Labs    12/07/22 0535  AST 53*  ALT 73*  ALKPHOS 145*  BILITOT 1.7*  PROT 6.6  ALBUMIN 3.4*   No results for input(s): "LIPASE", "AMYLASE" in the last 72 hours. Cardiac Enzymes No results for input(s): "CKTOTAL", "CKMB", "CKMBINDEX", "TROPONINI" in the last 72 hours.  BNP: BNP (last 3 results) Recent Labs    11/30/22 2119  BNP 777.6*    ProBNP (last 3 results) No results for input(s): "PROBNP" in the last 8760 hours.   D-Dimer No results for input(s): "DDIMER" in the last 72 hours. Hemoglobin A1C No results for input(s): "HGBA1C" in the last 72 hours. Fasting Lipid Panel No results for input(s): "CHOL", "HDL", "LDLCALC", "TRIG", "CHOLHDL", "LDLDIRECT" in the last 72 hours. Thyroid Function Tests No results for input(s): "TSH", "T4TOTAL", "T3FREE", "THYROIDAB" in the last 72 hours.  Invalid  input(s): "FREET3"  Other results:   Imaging    No results found.   Medications:     Scheduled Medications:  aspirin  81 mg Oral Daily   atorvastatin  40 mg Oral Daily   Chlorhexidine Gluconate Cloth  6 each Topical Daily   digoxin  0.125 mg Oral Daily   docusate sodium  100 mg Oral BID   empagliflozin  10 mg Oral Daily   enoxaparin (LOVENOX) injection  40 mg Subcutaneous Q24H   multivitamin with minerals  1 tablet Oral Daily   pantoprazole  40 mg Oral Daily   Ensure Max Protein  11 oz Oral BID   sodium chloride flush  10-40 mL Intracatheter Q12H   sodium chloride flush  3 mL Intravenous Q12H   sodium chloride flush  3 mL Intravenous Q12H   sodium chloride flush  3 mL Intravenous Q12H    spironolactone  25 mg Oral Daily    Infusions:  sodium chloride     furosemide 120 mg (12/08/22 1826)   milrinone Stopped (12/08/22 2132)    PRN Medications: sodium chloride, acetaminophen, albuterol, alum & mag hydroxide-simeth, bisacodyl, guaiFENesin-dextromethorphan, hydrALAZINE, nitroGLYCERIN, ondansetron (ZOFRAN) IV, ondansetron **OR** [DISCONTINUED] ondansetron (ZOFRAN) IV, oxyCODONE, polyethylene glycol, sodium chloride flush, sodium chloride flush, traZODone    Assessment/Plan   1. Acute on chronic systolic CHF: Echo this admission with EF < 20%, severe LV dilation, mildly decreased RV systolic function, moderate-severe functional MR, severe TR.  RHC with low output and biventricular failure.  NYHA class IV at admission, weight up > 30 lbs last few months.  Uncertain cause of cardiomyopathy, ?LBBB vs HTN vs viral myocarditis.  LBBB is not markedly wide but dyssynchrony on echo. Has been on milrinone but stopped overnight with SBP 80s, co-ox 75% today.  CVP 13-14.  Creatinine mildly higher at 1.57, still volume overloaded though weight down 3 lbs from yesterday.  SBP 80s-90s.  - Would restart milrinone 0.25 as she needs further diuresis.   - Continue digoxin 0.125 daily.  - Continue Lasix 120 mg IV bid today.  - Continue Jardiance 10 mg daily.  - Decrease spironolactone to 12.5 daily.  - With lower BP, will stop losartan.  - Unna boots.  - Has received 2 doses of Ferrlicit, so will have to wait for cardiac MRI.  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  2. Elevated Hs-TnI: 895>917>720>397>382.   - Completed 48 hrs heparin gtt.  - Continue ASA/statin.  - Cath when creatinine down and diuresed/off milrinone, ?Wednesday.  3. 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  Follow with diuresis and GDMT.  4. 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   5. HTN:  BP not elevated 6. AKI: Suspect cardiorenal, baseline creatinine around 1.  Creatinine up to 2.1, now 1.49 => 1.57.  - Continue milrinone for now.  - Follow creatinine with diuresis.  7. Elevated LFTs: Suspect congestive hepatopathy, improving.   Length of Stay: [place], MD  12/09/2022, 10:49 AM  Advanced Heart Failure Team Pager [phone] (M-F; 7a - 5p)  Please contact [facility] for night-coverage after hours (5p -7a ) and weekends on [email or web address]

## 2022-12-10 DIAGNOSIS — I5021 Acute systolic (congestive) heart failure: Secondary | ICD-10-CM | POA: Diagnosis not present

## 2022-12-10 LAB — CBC WITH DIFFERENTIAL/PLATELET
Abs Immature Granulocytes: 0.04 10*3/uL (ref 0.00–0.07)
Basophils Absolute: 0 10*3/uL (ref 0.0–0.1)
Basophils Relative: 0 %
Eosinophils Absolute: 0.3 10*3/uL (ref 0.0–0.5)
Eosinophils Relative: 2 %
HCT: 33.5 % — ABNORMAL LOW (ref 36.0–46.0)
Hemoglobin: 10.1 g/dL — ABNORMAL LOW (ref 12.0–15.0)
Immature Granulocytes: 0 %
Lymphocytes Relative: 21 %
Lymphs Abs: 2.4 10*3/uL (ref 0.7–4.0)
MCH: 22 pg — ABNORMAL LOW (ref 26.0–34.0)
MCHC: 30.1 g/dL (ref 30.0–36.0)
MCV: 72.8 fL — ABNORMAL LOW (ref 80.0–100.0)
Monocytes Absolute: 1.4 10*3/uL — ABNORMAL HIGH (ref 0.1–1.0)
Monocytes Relative: 12 %
Neutro Abs: 7.4 10*3/uL (ref 1.7–7.7)
Neutrophils Relative %: 65 %
Platelets: 319 10*3/uL (ref 150–400)
RBC: 4.6 MIL/uL (ref 3.87–5.11)
RDW: 19.8 % — ABNORMAL HIGH (ref 11.5–15.5)
WBC: 11.5 10*3/uL — ABNORMAL HIGH (ref 4.0–10.5)
nRBC: 0 % (ref 0.0–0.2)

## 2022-12-10 LAB — BASIC METABOLIC PANEL
Anion gap: 10 (ref 5–15)
BUN: 13 mg/dL (ref 6–20)
CO2: 29 mmol/L (ref 22–32)
Calcium: 8.7 mg/dL — ABNORMAL LOW (ref 8.9–10.3)
Chloride: 94 mmol/L — ABNORMAL LOW (ref 98–111)
Creatinine, Ser: 1.41 mg/dL — ABNORMAL HIGH (ref 0.44–1.00)
GFR, Estimated: 44 mL/min — ABNORMAL LOW (ref >60)
Glucose, Bld: 129 mg/dL — ABNORMAL HIGH (ref 70–99)
Potassium: 3.9 mmol/L (ref 3.5–5.1)
Sodium: 133 mmol/L — ABNORMAL LOW (ref 135–145)

## 2022-12-10 LAB — COOXEMETRY PANEL
Carboxyhemoglobin: 1.6 % — ABNORMAL HIGH (ref 0.5–1.5)
Methemoglobin: <0.7 % (ref 0.0–1.5)
O2 Saturation: 79.6 %
Total hemoglobin: 10.4 g/dL — ABNORMAL LOW (ref 12.0–16.0)

## 2022-12-10 LAB — MAGNESIUM: Magnesium: 2.2 mg/dL (ref 1.7–2.4)

## 2022-12-10 MED ORDER — POTASSIUM CHLORIDE CRYS ER 20 MEQ PO TBCR
40.0000 meq | EXTENDED_RELEASE_TABLET | Freq: Two times a day (BID) | ORAL | Status: AC
  Administered 2022-12-10 (×2): 40 meq via ORAL
  Filled 2022-12-10 (×2): qty 2

## 2022-12-10 MED ORDER — SPIRONOLACTONE 25 MG PO TABS
25.0000 mg | ORAL_TABLET | Freq: Every day | ORAL | Status: DC
  Administered 2022-12-10 – 2022-12-11 (×2): 25 mg via ORAL
  Filled 2022-12-10 (×2): qty 1

## 2022-12-10 MED ORDER — METOLAZONE 5 MG PO TABS
2.5000 mg | ORAL_TABLET | Freq: Once | ORAL | Status: AC
  Administered 2022-12-10: 2.5 mg via ORAL
  Filled 2022-12-10: qty 1

## 2022-12-10 NOTE — TOC Initial Note (Signed)
Transition of Care [facility]) - Initial/Assessment Note    Patient Details  Name: Stephanie Gates MRN: [id number] Date of Birth: May 26, 1966  [facility]) CM/SW Contact:    Erenest Rasher, RN Phone Number: [phone] 12/10/2022, 6:03 PM  Clinical Narrative:                 HF TOC CM spoke to pt and states she works full-time. She has FMLA paperwork that needs to be completed. She will forward to CM and will take to HF Clinic RN, Nira Conn to complete. She was independent PTA. Will continue to follow for dc needs.   Expected Discharge Plan: Home/Self Care Barriers to Discharge: Continued Medical Work up   Patient Goals and CMS Choice Patient states their goals for this hospitalization and ongoing recovery are:: wants to recover CMS Medicare.gov Compare Post Acute Care list provided to:: Patient        Expected Discharge Plan and Services   Discharge Planning Services: CM Consult   Living arrangements for the past 2 months: Single Family Home                                      Prior Living Arrangements/Services Living arrangements for the past 2 months: Single Family Home Lives with:: Adult Children Patient language and need for interpreter reviewed:: Yes Do you feel safe going back to the place where you live?: Yes      Need for Family Participation in Patient Care: No (Comment) Care giver support system in place?: Yes (comment)   Criminal Activity/Legal Involvement Pertinent to Current Situation/Hospitalization: No - Comment as needed  Activities of Daily Living      Permission Sought/Granted Permission sought to share information with : Case Manager, Family Supports, PCP Permission granted to share information with : Yes, Verbal Permission Granted  Share Information with NAME: Marilynn Rail     Permission granted to share info w Relationship: daughter  Permission granted to share info w Contact Information: [phone]  Emotional  Assessment Appearance:: Appears stated age Attitude/Demeanor/Rapport: Engaged Affect (typically observed): Accepting Orientation: : Oriented to Self, Oriented to Place, Oriented to  Time, Oriented to Situation   Psych Involvement: No (comment)  Admission diagnosis:  Peripheral edema [R60.9] Pleural effusion [J90] Elevated troponin [R79.89] Exertional chest pain [R07.9] Elevated brain natriuretic peptide (BNP) level [R79.89] Exertional shortness of breath [R06.02] Hypervolemia, unspecified hypervolemia type [E87.70] New onset of congestive heart failure (HCC) [I50.9] Patient Active Problem List   Diagnosis Date Noted   AKI (acute kidney injury) ([place]) 12/04/2022   Hypomagnesemia 12/04/2022   NSVT (nonsustained ventricular tachycardia) ([place]) 12/04/2022   Myocardial injury 12/04/2022   Severe mitral regurgitation 12/04/2022   Severe tricuspid regurgitation 12/04/2022   Abdominal discomfort 12/04/2022   Pre-diabetes 12/01/2022   Hypokalemia 87/56/4332   Acute systolic CHF (congestive heart failure) ([place]) 11/30/2022   History of colonic polyps    Allergic reaction 12/16/2020   Pruritus 12/16/2020   Allergic dermatitis 12/16/2020   Acute pain of right knee 01/14/2020   Neck pain 12/30/2018   Nontoxic goiter, unspecified 12/12/2017   Pain in left knee 12/12/2017   Lateral epicondylitis, left elbow 12/12/2017   Carpal tunnel syndrome, bilateral upper limbs 12/12/2017   Morbid obesity with BMI of 50.0-59.9, adult ([place]) 12/12/2017   Headache 12/12/2017   Snoring 12/12/2017   Vitamin D deficiency, unspecified 12/12/2017   Impaired fasting  glucose 12/12/2017   Hypersomnia, unspecified 12/12/2017   Vasomotor rhinitis 12/12/2017   Essential (primary) hypertension 12/12/2017   Pain in right hip 12/12/2017   Primary generalized (osteo)arthritis 12/12/2017   IDA (iron deficiency anemia) 06/09/2015   PCP:  Jonetta Osgood, NP Pharmacy:   [facility] [place], [place]  - [address] [place] [address] Phone: [phone] Fax: [id number] [place], [place] - [address] [place] [address] Phone: [phone] Fax: [facility] [address] [place] [address] Phone: [phone] Fax: [phone]     Social Determinants of Health (SDOH) Social History: [place]: Low Risk  (12/05/2022)  Transportation Needs: No Transportation Needs (12/05/2022)  Alcohol Screen: Low Risk  (09/18/2022)  Depression (PHQ2-9): Low Risk  (09/18/2022)  Financial Resource Strain: Low Risk  (12/05/2022)  Tobacco Use: Low Risk  (12/06/2022)   SDOH Interventions: Housing Interventions: Intervention Not Indicated Transportation Interventions: Intervention Not Indicated Alcohol Usage Interventions: Intervention Not Indicated (Score <7) Financial Strain Interventions: Intervention Not Indicated   Readmission Risk Interventions     No data to display

## 2022-12-10 NOTE — Progress Notes (Signed)
Physical Therapy Treatment Patient Details Name: Stephanie Gates MRN: [id number] DOB: February 04, 1966 Today's Date: 12/10/2022   History of Present Illness Pt is a 57 y.o. F admitted 11/30/22 with SOB, BLE edema, emesis. Workup for new onset CHF. PMH includes HTN, HLD, DM, obesity.    PT Comments    Patient progressing well towards PT goals. Session focused on progressive ambulation. Tolerated transfers and ambulation mod I using IV pole initially progressing to no DME needed. HR up to 128 bpm with activity and Sp02 99% on RA. Mild DOE noted. Reports feeling at 80% of her functional baseline. Encouraged walking with family/staff 3 times daily. Pt does not require further skilled therapy services as she is functioning at Mod I level and has family support. All education completed. Discharge from therapy.   Recommendations for follow up therapy are one component of a multi-disciplinary discharge planning process, led by the attending physician.  Recommendations may be updated based on patient status, additional functional criteria and insurance authorization.  Follow Up Recommendations  No PT follow up     Assistance Recommended at Discharge PRN  Patient can return home with the following Assistance with cooking/housework;Assist for transportation   Equipment Recommendations  None recommended by PT    Recommendations for Other Services       Precautions / Restrictions Precautions Precautions: None Precaution Comments: watch HR Restrictions Weight Bearing Restrictions: No     Mobility  Bed Mobility Overal bed mobility: Modified Independent                  Transfers Overall transfer level: Modified independent Equipment used: None Transfers: Sit to/from Stand Sit to Stand: Modified independent (Device/Increase time)           General transfer comment: Stood from EOB x2 without difficulty.    Ambulation/Gait Ambulation/Gait assistance: Modified independent  (Device/Increase time), Supervision Gait Distance (Feet): 250 Feet Assistive device: IV Pole, None Gait Pattern/deviations: Step-through pattern, Decreased stride length, Wide base of support   Gait velocity interpretation: 1.31 - 2.62 ft/sec, indicative of limited community ambulator   General Gait Details: Slow, steady gait using IV pole initially progressing to no UE support. 2/4 DOE. Sp02 started at 88% but ended at 99% on RA.   Stairs             Wheelchair Mobility    Modified Rankin (Stroke Patients Only)       Balance Overall balance assessment: Needs assistance Sitting-balance support: No upper extremity supported, Feet supported Sitting balance-Leahy Scale: Good     Standing balance support: During functional activity Standing balance-Leahy Scale: Good                              Cognition Arousal/Alertness: Awake/alert Behavior During Therapy: WFL for tasks assessed/performed Overall Cognitive Status: Within Functional Limits for tasks assessed                                          Exercises      General Comments General comments (skin integrity, edema, etc.): Daughter present in room. HR up to 128 bpm with activity. Sp02 99%.      Pertinent Vitals/Pain Pain Assessment Pain Assessment: Faces Faces Pain Scale: Hurts a little bit Pain Location: abdomen Pain Descriptors / Indicators: Sore Pain Intervention(s): Monitored during session    Home  Living                          Prior Function            PT Goals (current goals can now be found in the care plan section) Progress towards PT goals: Goals met/education completed, patient discharged from PT    Frequency    Min 2X/week      PT Plan Current plan remains appropriate    Co-evaluation              AM-PAC PT "6 Clicks" Mobility   Outcome Measure  Help needed turning from your back to your side while in a flat bed without using  bedrails?: None Help needed moving from lying on your back to sitting on the side of a flat bed without using bedrails?: None Help needed moving to and from a bed to a chair (including a wheelchair)?: None Help needed standing up from a chair using your arms (e.g., wheelchair or bedside chair)?: None Help needed to walk in hospital room?: A Little Help needed climbing 3-5 steps with a railing? : A Little 6 Click Score: 22    End of Session   Activity Tolerance: Patient tolerated treatment well Patient left: Other (comment) (standing in room) Nurse Communication: Mobility status PT Visit Diagnosis: Other abnormalities of gait and mobility (R26.89);Muscle weakness (generalized) (M62.81)     Time: 3235-5732 PT Time Calculation (min) (ACUTE ONLY): 13 min  Charges:  $Therapeutic Exercise: 8-22 mins                     Marisa Severin, PT, DPT Acute Rehabilitation Services Secure chat preferred Office [place] 12/10/2022, 9:01 AM

## 2022-12-10 NOTE — Progress Notes (Addendum)
Patient ID: Stephanie Gates, female   DOB: 1966-11-22, 57 y.o.   MRN: [id number]  PCP-Cardiologist: None   Subjective:    Back on 0.25 milrinone. CO-OX 80%.  CVP 15. 2.6L UOP charted yesterday with high dose IV lasix. Weight down 5 lb.   Scr slightly improved, 1.57>1.41.  LE edema and abdominal bloating continue to improve. No dyspnea at rest.  RHC:  RA = 22 RV = 49/22 PA = 49/26 (33) PCW = 23  Fick cardiac output/index = 6.1/2.6 TD CO/CI = 4.1/1.7 PVR = 1.6 (Fick) 2.5 (TD) Ao sat = 99% PA sat = 64%, 66% SVC sat = 65% PaPI = 1.0  Echo: EF < 20%, severe LV dilation, mildly decreased RV systolic function, moderate-severe functional MR, severe TR.    Objective:   Weight Range: 125.1 kg Body mass index is 45.88 kg/m.   Vital Signs:   Temp:  [97.9 F (36.6 C)-98.4 F (36.9 C)] 98.1 F (36.7 C) (01/02 0342) Pulse Rate:  [83-98] 98 (01/02 0342) Resp:  [14-20] 16 (01/02 0342) BP: (84-114)/(42-66) 114/64 (01/02 0342) SpO2:  [97 %-100 %] 97 % (01/02 0342) Weight:  [125.1 kg] 125.1 kg (01/02 0342) Last BM Date : 12/09/22  Weight change: Filed Weights   12/08/22 0435 12/09/22 0500 12/10/22 0342  Weight: 128.5 kg 127.1 kg 125.1 kg    Intake/Output:   Intake/Output Summary (Last 24 hours) at 12/10/2022 0738 Last data filed at 12/10/2022 0300 Gross per 24 hour  Intake 1069.24 ml  Output 2550 ml  Net -1480.76 ml      Physical Exam    General:  Sitting up on side of bed. No distress. HEENT: normal Neck: supple. JVP 14-16 cm. Carotids 2+ bilat; no bruits.  Cor: PMI nondisplaced. Regular rate & rhythm, slightly tachy. No rubs, gallops, 2/6 HSM best heard LLSB/apex Lungs: clear Abdomen: obese, soft, nontender, nondistended.  Extremities: no cyanosis, clubbing, rash,1+ edema, + UNNA Neuro: alert & orientedx3, cranial nerves grossly intact. moves all 4 extremities w/o difficulty. Affect pleasant    Telemetry   SR/ST 90s-100s, 5 beat run NSVT (personally  reviewed)   Labs    CBC Recent Labs    12/09/22 0432 12/10/22 0342  WBC 10.4 11.5*  NEUTROABS 7.0 7.4  HGB 9.7* 10.1*  HCT 31.3* 33.5*  MCV 71.0* 72.8*  PLT 310 782   Basic Metabolic Panel Recent Labs    12/09/22 0432 12/10/22 0342  NA 137 133*  K 3.9 3.9  CL 93* 94*  CO2 31 29  GLUCOSE 84 129*  BUN 13 13  CREATININE 1.57* 1.41*  CALCIUM 9.1 8.7*  MG 2.5* 2.2   Liver Function Tests No results for input(s): "AST", "ALT", "ALKPHOS", "BILITOT", "PROT", "ALBUMIN" in the last 72 hours.  No results for input(s): "LIPASE", "AMYLASE" in the last 72 hours. Cardiac Enzymes No results for input(s): "CKTOTAL", "CKMB", "CKMBINDEX", "TROPONINI" in the last 72 hours.  BNP: BNP (last 3 results) Recent Labs    11/30/22 2119  BNP 777.6*    ProBNP (last 3 results) No results for input(s): "PROBNP" in the last 8760 hours.   D-Dimer No results for input(s): "DDIMER" in the last 72 hours. Hemoglobin A1C No results for input(s): "HGBA1C" in the last 72 hours. Fasting Lipid Panel No results for input(s): "CHOL", "HDL", "LDLCALC", "TRIG", "CHOLHDL", "LDLDIRECT" in the last 72 hours. Thyroid Function Tests No results for input(s): "TSH", "T4TOTAL", "T3FREE", "THYROIDAB" in the last 72 hours.  Invalid input(s): "FREET3"  Other results:  Imaging    No results found.   Medications:     Scheduled Medications:  aspirin  81 mg Oral Daily   atorvastatin  40 mg Oral Daily   Chlorhexidine Gluconate Cloth  6 each Topical Daily   digoxin  0.125 mg Oral Daily   docusate sodium  100 mg Oral BID   empagliflozin  10 mg Oral Daily   enoxaparin (LOVENOX) injection  40 mg Subcutaneous Q24H   multivitamin with minerals  1 tablet Oral Daily   pantoprazole  40 mg Oral Daily   Ensure Max Protein  11 oz Oral BID   sodium chloride flush  10-40 mL Intracatheter Q12H   sodium chloride flush  3 mL Intravenous Q12H   sodium chloride flush  3 mL Intravenous Q12H   sodium chloride  flush  3 mL Intravenous Q12H   spironolactone  12.5 mg Oral Daily    Infusions:  sodium chloride     furosemide Stopped (12/09/22 1812)   milrinone 0.25 mcg/kg/min (12/10/22 0344)    PRN Medications: sodium chloride, acetaminophen, albuterol, alum & mag hydroxide-simeth, bisacodyl, guaiFENesin-dextromethorphan, hydrALAZINE, nitroGLYCERIN, ondansetron (ZOFRAN) IV, ondansetron **OR** [DISCONTINUED] ondansetron (ZOFRAN) IV, oxyCODONE, polyethylene glycol, sodium chloride flush, sodium chloride flush, traZODone    Assessment/Plan   1. Acute on chronic systolic CHF: Echo this admission with EF < 20%, severe LV dilation, mildly decreased RV systolic function, moderate-severe functional MR, severe TR.  RHC with low output and biventricular failure.  NYHA class IV at admission, weight up > 30 lbs last few months.  Uncertain cause of cardiomyopathy, ?LBBB vs HTN vs viral myocarditis.  LBBB is not markedly wide but dyssynchrony on echo. No family history of CHF. - CO-OX 80% on milrinone 0.25. CVP 15. Continue IV lasix 120 BID. Add metolazone 2.5 mg X 1.  - Not sure how low we will be able to get CVP with RV failure, PaPI only 1 on RHC - Continue digoxin 0.125 daily.  - Continue Jardiance 10 mg daily.  - Losartan stopped 01/01 d/t hypotension - BP improved, will increase spiro back to 25 mg daily - No beta blocker with low-output - Unna boots.  - Has received 2 doses of Ferrlicit, so will have to wait for cardiac MRI.  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  2. Elevated Hs-TnI: 895>917>720>397>382.   - Completed 48 hrs heparin gtt.  - Continue ASA/statin.  - Cath when creatinine down and diuresed/off milrinone, ?Thursday  3. 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  Follow with diuresis and GDMT.  4. 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   5. HTN: BP not  elevated 6. AKI: Suspect cardiorenal, baseline creatinine around 1.  Creatinine up to 2.1, Scr down to 1.41 today with inotrope support and diuresis - Continue milrinone today 7. Elevated LFTs: Suspect congestive hepatopathy, improving.   Length of Stay: 9  [facility], [place], PA-C  12/10/2022, 7:38 AM  Advanced Heart Failure Team Pager [phone] (M-F; 7a - 5p)  Please contact [facility] for night-coverage after hours (5p -7a ) and weekends on [email or web address]   Patient seen with PA, agree with the above note.  CVP remains high at 15.  Good diuresis again yesterday, weight down 5 lbs.  Creatinine lower at 1.41.  Co-ox 80% on milrinone 0.25.  BP better today off losartan.   General: NAD Neck: JVP 14 cm, no thyromegaly or  thyroid nodule.  Lungs: Clear to auscultation bilaterally with normal respiratory effort. CV: Nondisplaced PMI.  Heart regular S1/S2, no S3/S4, 2/6 HSM LLSB/apex.  1+ edema to knees.  Abdomen: Soft, nontender, no hepatosplenomegaly, no distention.  Skin: Intact without lesions or rashes.  Neurologic: Alert and oriented x 3.  Psych: Normal affect. Extremities: No clubbing or cyanosis.  HEENT: Normal.   Will give Lasix 120 mg IV bid again today with metolazone 2.5 x 1.  Increase spironolactone back to 25 mg daily.    Once volume status is as good as we can get, will wean off milrinone and repeat RHC with coronary angiography.  Creatinine trending down.   Stephanie Gates 12/10/2022 10:52 AM

## 2022-12-10 NOTE — Progress Notes (Addendum)
Occupational Therapy Treatment and Discharge Patient Details Name: Stephanie Gates MRN: [id number] DOB: 1966/07/30 Today's Date: 12/10/2022   History of present illness Pt is a 57 y.o. F admitted 11/30/22 with SOB, BLE edema, emesis. Workup for new onset CHF. PMH includes HTN, HLD, DM, obesity.   OT comments  Pt meeting OT goals this session, completing bed mobility and transfers with mod I, min A needed for LB dressing with use of sock aid (due to unna boots on BLEs), also educated on use of reacher. HR up to 131 with ambulation to bathroom and standing grooming task. Educated pt on use of BSC as shower seat for home, pt verbalized understanding. Pt presenting with impairments listed below, however has no acute OT needs at this time, will s/o. Please reconsult if there is a change in pt status, anticipate no OT follow up needs at d/c.   Recommendations for follow up therapy are one component of a multi-disciplinary discharge planning process, led by the attending physician.  Recommendations may be updated based on patient status, additional functional criteria and insurance authorization.    Follow Up Recommendations  No OT follow up     Assistance Recommended at Discharge PRN  Patient can return home with the following  A little help with bathing/dressing/bathroom;Assistance with cooking/housework;Assist for transportation   Equipment Recommendations  BSC/3in1 (bariatric)    Recommendations for Other Services      Precautions / Restrictions Precautions Precautions: None Precaution Comments: watch HR Restrictions Weight Bearing Restrictions: No       Mobility Bed Mobility Overal bed mobility: Modified Independent                  Transfers Overall transfer level: Modified independent   Transfers: Sit to/from Stand                   Balance Overall balance assessment: Needs assistance Sitting-balance support: No upper extremity supported, Feet  supported Sitting balance-Leahy Scale: Good     Standing balance support: During functional activity Standing balance-Leahy Scale: Good                             ADL either performed or assessed with clinical judgement   ADL Overall ADL's : Needs assistance/impaired     Grooming: Modified independent;Wash/dry hands;Standing Grooming Details (indicate cue type and reason): standing at sink             Lower Body Dressing: Minimal assistance;Sitting/lateral leans;With adaptive equipment Lower Body Dressing Details (indicate cue type and reason): demos use of AE (sock aid) for LB dressing, suspect easier use of AE once home as pt's BLE's wrapped with Media planner Transfer: Modified Independent   Toileting- Clothing Manipulation and Hygiene: Modified independent       Functional mobility during ADLs: Modified independent      Extremity/Trunk Assessment Upper Extremity Assessment Upper Extremity Assessment: Overall WFL for tasks assessed   Lower Extremity Assessment Lower Extremity Assessment: Defer to PT evaluation        Vision   Vision Assessment?: No apparent visual deficits   Perception Perception Perception: Not tested   Praxis Praxis Praxis: Not tested    Cognition Arousal/Alertness: Awake/alert Behavior During Therapy: WFL for tasks assessed/performed Overall Cognitive Status: Within Functional Limits for tasks assessed  Exercises      Shoulder Instructions       General Comments HR up to 131 with ambulation to bathroom    Pertinent Vitals/ Pain       Pain Assessment Pain Assessment: No/denies pain  Home Living Family/patient expects to be discharged to:: Private residence Living Arrangements: Children Available Help at Discharge: Family;Available 24 hours/day Type of Home: House Home Access: Level entry     Home Layout: One level     Bathroom Shower/Tub:  Tub/shower unit;Curtain   Biochemist, clinical: Handicapped height     Home Equipment: Radio producer - single point   Additional Comments: lives with daughter who also works from home and can be available to assist      Prior Functioning/Environment              Frequency  Min 2X/week        Progress Toward Goals  OT Goals(current goals can now be found in the care plan section)  Progress towards OT goals: Goals met/education completed, patient discharged from OT  Acute Rehab OT Goals Patient Stated Goal: to go home OT Goal Formulation: With patient Time For Goal Achievement: 12/17/22 Potential to Achieve Goals: Good ADL Goals Pt Will Perform Upper Body Dressing: Independently;sitting Pt Will Perform Lower Body Dressing: Independently;sitting/lateral leans;sit to/from stand Pt Will Transfer to Toilet: Independently;ambulating;regular height toilet Pt Will Perform Tub/Shower Transfer: Tub transfer;Shower transfer;ambulating;shower seat Additional ADL Goal #1: pt will verbalize 3 energy conservation strategies in prep for ADLs  Plan All goals met and education completed, patient discharged from OT services    Co-evaluation                 AM-PAC OT "6 Clicks" Daily Activity     Outcome Measure   Help from another person eating meals?: None Help from another person taking care of personal grooming?: None Help from another person toileting, which includes using toliet, bedpan, or urinal?: A Little Help from another person bathing (including washing, rinsing, drying)?: A Little Help from another person to put on and taking off regular upper body clothing?: A Little Help from another person to put on and taking off regular lower body clothing?: A Little 6 Click Score: 20    End of Session Equipment Utilized During Treatment: Gait belt  OT Visit Diagnosis: Unsteadiness on feet (R26.81);Other abnormalities of gait and mobility (R26.89);Muscle weakness (generalized)  (M62.81)   Activity Tolerance Patient tolerated treatment well   Patient Left in bed;with call bell/phone within reach;with family/visitor present   Nurse Communication Mobility status        Time: 6825-7493 OT Time Calculation (min): 19 min  Charges: OT General Charges $OT Visit: 1 Visit OT Treatments $Self Care/Home Management : 8-22 mins  Renaye Rakers, OTD, OTR/L SecureChat Preferred Acute Rehab [phone] - Daniel 12/10/2022, 2:05 PM

## 2022-12-11 DIAGNOSIS — I5021 Acute systolic (congestive) heart failure: Secondary | ICD-10-CM | POA: Diagnosis not present

## 2022-12-11 LAB — BASIC METABOLIC PANEL
Anion gap: 12 (ref 5–15)
BUN: 15 mg/dL (ref 6–20)
CO2: 31 mmol/L (ref 22–32)
Calcium: 9.2 mg/dL (ref 8.9–10.3)
Chloride: 90 mmol/L — ABNORMAL LOW (ref 98–111)
Creatinine, Ser: 1.29 mg/dL — ABNORMAL HIGH (ref 0.44–1.00)
GFR, Estimated: 49 mL/min — ABNORMAL LOW (ref >60)
Glucose, Bld: 92 mg/dL (ref 70–99)
Potassium: 3.7 mmol/L (ref 3.5–5.1)
Sodium: 133 mmol/L — ABNORMAL LOW (ref 135–145)

## 2022-12-11 LAB — CBC WITH DIFFERENTIAL/PLATELET
Abs Immature Granulocytes: 0.05 10*3/uL (ref 0.00–0.07)
Basophils Absolute: 0 10*3/uL (ref 0.0–0.1)
Basophils Relative: 0 %
Eosinophils Absolute: 0.3 10*3/uL (ref 0.0–0.5)
Eosinophils Relative: 2 %
HCT: 34.2 % — ABNORMAL LOW (ref 36.0–46.0)
Hemoglobin: 10.8 g/dL — ABNORMAL LOW (ref 12.0–15.0)
Immature Granulocytes: 0 %
Lymphocytes Relative: 19 %
Lymphs Abs: 2.2 10*3/uL (ref 0.7–4.0)
MCH: 22.2 pg — ABNORMAL LOW (ref 26.0–34.0)
MCHC: 31.6 g/dL (ref 30.0–36.0)
MCV: 70.4 fL — ABNORMAL LOW (ref 80.0–100.0)
Monocytes Absolute: 1.4 10*3/uL — ABNORMAL HIGH (ref 0.1–1.0)
Monocytes Relative: 12 %
Neutro Abs: 7.8 10*3/uL — ABNORMAL HIGH (ref 1.7–7.7)
Neutrophils Relative %: 67 %
Platelets: 286 10*3/uL (ref 150–400)
RBC: 4.86 MIL/uL (ref 3.87–5.11)
RDW: 19.7 % — ABNORMAL HIGH (ref 11.5–15.5)
WBC: 11.8 10*3/uL — ABNORMAL HIGH (ref 4.0–10.5)
nRBC: 0 % (ref 0.0–0.2)

## 2022-12-11 LAB — COOXEMETRY PANEL
Carboxyhemoglobin: 2.2 % — ABNORMAL HIGH (ref 0.5–1.5)
Carboxyhemoglobin: 1.8 % — ABNORMAL HIGH (ref 0.5–1.5)
Methemoglobin: <0.7 % (ref 0.0–1.5)
Methemoglobin: <0.7 % (ref 0.0–1.5)
O2 Saturation: 88.8 %
O2 Saturation: 64.4 %
Total hemoglobin: 9.8 g/dL — ABNORMAL LOW (ref 12.0–16.0)
Total hemoglobin: 11.5 g/dL — ABNORMAL LOW (ref 12.0–16.0)

## 2022-12-11 LAB — MAGNESIUM: Magnesium: 2 mg/dL (ref 1.7–2.4)

## 2022-12-11 LAB — LIPOPROTEIN A (LPA): Lipoprotein (a): 33.6 nmol/L — ABNORMAL HIGH (ref <75.0)

## 2022-12-11 MED ORDER — ALTEPLASE 2 MG IJ SOLR
2.0000 mg | Freq: Once | INTRAMUSCULAR | Status: AC
  Administered 2022-12-12: 2 mg
  Filled 2022-12-11 (×2): qty 2

## 2022-12-11 MED ORDER — FUROSEMIDE 10 MG/ML IJ SOLN
120.0000 mg | Freq: Once | INTRAVENOUS | Status: AC
  Administered 2022-12-11: 120 mg via INTRAVENOUS
  Filled 2022-12-11: qty 10

## 2022-12-11 MED ORDER — LOSARTAN POTASSIUM 25 MG PO TABS
12.5000 mg | ORAL_TABLET | Freq: Every day | ORAL | Status: DC
  Administered 2022-12-11: 12.5 mg via ORAL
  Filled 2022-12-11: qty 1

## 2022-12-11 MED ORDER — SODIUM CHLORIDE 0.9 % IV SOLN: INTRAVENOUS | Status: DC

## 2022-12-11 MED ORDER — SODIUM CHLORIDE 0.9% FLUSH
3.0000 mL | Freq: Two times a day (BID) | INTRAVENOUS | Status: DC
  Administered 2022-12-11 – 2022-12-12 (×3): 3 mL via INTRAVENOUS

## 2022-12-11 MED ORDER — SODIUM CHLORIDE 0.9 % IV SOLN: 250.0000 mL | INTRAVENOUS | Status: DC | PRN

## 2022-12-11 MED ORDER — SODIUM CHLORIDE 0.9% FLUSH: 3.0000 mL | INTRAVENOUS | Status: DC | PRN

## 2022-12-11 MED ORDER — POTASSIUM CHLORIDE 20 MEQ PO PACK
40.0000 meq | PACK | Freq: Once | ORAL | Status: AC
  Administered 2022-12-11: 40 meq via ORAL
  Filled 2022-12-11: qty 2

## 2022-12-11 MED ORDER — ASPIRIN 81 MG PO CHEW
81.0000 mg | CHEWABLE_TABLET | ORAL | Status: AC
  Administered 2022-12-12: 81 mg via ORAL
  Filled 2022-12-11: qty 1

## 2022-12-11 MED ORDER — POTASSIUM CHLORIDE CRYS ER 20 MEQ PO TBCR: 40.0000 meq | EXTENDED_RELEASE_TABLET | Freq: Once | ORAL | Status: DC

## 2022-12-11 MED ORDER — MILRINONE LACTATE IN DEXTROSE 20-5 MG/100ML-% IV SOLN
0.1250 ug/kg/min | INTRAVENOUS | Status: DC
  Administered 2022-12-11: 0.125 ug/kg/min via INTRAVENOUS
  Filled 2022-12-11: qty 100

## 2022-12-11 NOTE — Progress Notes (Signed)
Purple lumen on PICC is no longer patent.  Notified IV team to assess.

## 2022-12-11 NOTE — Plan of Care (Signed)

## 2022-12-11 NOTE — Progress Notes (Addendum)
Patient ID: Stephanie Gates, female   DOB: [birth year], 57 y.o.   MRN: [id number]  PCP-Cardiologist: None   Subjective:    On Milrinone 0.25. Issues w/ PICC this morning, unable to check CVP.   Brisk diuresis yesterday, 5.7L in UOP. Wt down 14 lb.    Scr improved, 1.57>1.41>>1.29. K 3.7 Mg 2.0   Breathing improved. Only current complaint is headache.   RHC:  RA = 22 RV = 49/22 PA = 49/26 (33) PCW = 23  Fick cardiac output/index = 6.1/2.6 TD CO/CI = 4.1/1.7 PVR = 1.6 (Fick) 2.5 (TD) Ao sat = 99% PA sat = 64%, 66% SVC sat = 65% PaPI = 1.0  Echo: EF < 20%, severe LV dilation, mildly decreased RV systolic function, moderate-severe functional MR, severe TR.    Objective:   Weight Range: 118.5 kg Body mass index is 43.47 kg/m.   Vital Signs:   Temp:  [97.7 F (36.5 C)-98.6 F (37 C)] 98.5 F (36.9 C) (01/03 0345) Pulse Rate:  [96-102] 100 (01/03 0345) Resp:  [18-20] 18 (01/03 0345) BP: (104-132)/(46-76) 106/58 (01/03 0345) SpO2:  [90 %-99 %] 97 % (01/03 0345) Weight:  [118.5 kg] 118.5 kg (01/03 0659) Last BM Date : 12/09/22  Weight change: Filed Weights   12/09/22 0500 12/10/22 0342 12/11/22 0659  Weight: 127.1 kg 125.1 kg 118.5 kg    Intake/Output:   Intake/Output Summary (Last 24 hours) at 12/11/2022 0739 Last data filed at 12/11/2022 0346 Gross per 24 hour  Intake 1332.49 ml  Output 5650 ml  Net -4317.51 ml      Physical Exam   General:  Well appearing. No respiratory difficulty HEENT: normal Neck: supple. JVD 8-9 cm. Carotids 2+ bilat; no bruits. No lymphadenopathy or thyromegaly appreciated. Cor: PMI nondisplaced. Regular rate & rhythm. 2/6 HSM LLSB  Lungs: clear Abdomen: soft, nontender, nondistended. No hepatosplenomegaly. No bruits or masses. Good bowel sounds. Extremities: no cyanosis, clubbing, rash, edema + unna boots  Neuro: alert & oriented x 3, cranial nerves grossly intact. moves all 4 extremities w/o difficulty. Affect  pleasant.     Telemetry   SR/ST 90s-100s, (personally reviewed)   Labs    CBC Recent Labs    12/10/22 0342 12/11/22 0607  WBC 11.5* 11.8*  NEUTROABS 7.4 7.8*  HGB 10.1* 10.8*  HCT 33.5* 34.2*  MCV 72.8* 70.4*  PLT 319 409   Basic Metabolic Panel Recent Labs    12/10/22 0342 12/11/22 0607  NA 133* 133*  K 3.9 3.7  CL 94* 90*  CO2 29 31  GLUCOSE 129* 92  BUN 13 15  CREATININE 1.41* 1.29*  CALCIUM 8.7* 9.2  MG 2.2 2.0   Liver Function Tests No results for input(s): "AST", "ALT", "ALKPHOS", "BILITOT", "PROT", "ALBUMIN" in the last 72 hours.  No results for input(s): "LIPASE", "AMYLASE" in the last 72 hours. Cardiac Enzymes No results for input(s): "CKTOTAL", "CKMB", "CKMBINDEX", "TROPONINI" in the last 72 hours.  BNP: BNP (last 3 results) Recent Labs    11/30/22 2119  BNP 777.6*    ProBNP (last 3 results) No results for input(s): "PROBNP" in the last 8760 hours.   D-Dimer No results for input(s): "DDIMER" in the last 72 hours. Hemoglobin A1C No results for input(s): "HGBA1C" in the last 72 hours. Fasting Lipid Panel No results for input(s): "CHOL", "HDL", "LDLCALC", "TRIG", "CHOLHDL", "LDLDIRECT" in the last 72 hours. Thyroid Function Tests No results for input(s): "TSH", "T4TOTAL", "T3FREE", "THYROIDAB" in the last 72 hours.  Invalid input(s): "  FREET3"  Other results:   Imaging    No results found.   Medications:     Scheduled Medications:  alteplase  2 mg Intracatheter Once   aspirin  81 mg Oral Daily   atorvastatin  40 mg Oral Daily   Chlorhexidine Gluconate Cloth  6 each Topical Daily   digoxin  0.125 mg Oral Daily   docusate sodium  100 mg Oral BID   empagliflozin  10 mg Oral Daily   enoxaparin (LOVENOX) injection  40 mg Subcutaneous Q24H   multivitamin with minerals  1 tablet Oral Daily   pantoprazole  40 mg Oral Daily   Ensure Max Protein  11 oz Oral BID   sodium chloride flush  10-40 mL Intracatheter Q12H   sodium  chloride flush  3 mL Intravenous Q12H   sodium chloride flush  3 mL Intravenous Q12H   sodium chloride flush  3 mL Intravenous Q12H   spironolactone  25 mg Oral Daily    Infusions:  sodium chloride     furosemide 120 mg (12/10/22 1711)   milrinone 0.25 mcg/kg/min (12/11/22 0002)    PRN Medications: sodium chloride, acetaminophen, albuterol, alum & mag hydroxide-simeth, bisacodyl, guaiFENesin-dextromethorphan, hydrALAZINE, nitroGLYCERIN, ondansetron (ZOFRAN) IV, ondansetron **OR** [DISCONTINUED] ondansetron (ZOFRAN) IV, oxyCODONE, polyethylene glycol, sodium chloride flush, sodium chloride flush, traZODone    Assessment/Plan   1. Acute on chronic systolic CHF: Echo this admission with EF < 20%, severe LV dilation, mildly decreased RV systolic function, moderate-severe functional MR, severe TR.  RHC with low output and biventricular failure.  NYHA class IV at admission, weight up > 30 lbs last few months.  Uncertain cause of cardiomyopathy, ?LBBB vs HTN vs viral myocarditis.  LBBB is not markedly wide but dyssynchrony on echo. No family history of CHF. - On milrinone 0.25. Co-ox pending - Good diuresis w/ IV Lasix. Diuresed > 40 lb. RN to trouble shoot CVP/PICC line  - Not sure how low we will be able to get CVP with RV failure, PaPI only 1 on RHC - Continue digoxin 0.125 daily.  - Continue Jardiance 10 mg daily.  - Losartan stopped 01/01 d/t hypotension - Continue spironolactone 25 mg daily  - No beta blocker with low-output - Unna boots.  - Has received 2 doses of Ferrlicit, so will have to wait for cardiac MRI.  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 Will arrange for tomorrow  2. Elevated Hs-TnI: 895>917>720>397>382.   - Completed 48 hrs heparin gtt.  - Continue ASA/statin.  - Plan [facility] tomorrow  3. 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  Follow with diuresis and GDMT (repeat echo in  future).  4. 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   5. HTN: BP not elevated 6. AKI: Suspect cardiorenal, baseline creatinine around 1.  Creatinine up to 2.1, Scr down to 1.29 today with inotrope support and diuresis - Continue milrinone today 7. Elevated LFTs: Suspect congestive hepatopathy, improving.   Length of Stay: [address] PA-C  12/11/2022, 7:39 AM  Advanced Heart Failure Team Pager [phone] (M-F; 7a - 5p)  Please contact [facility] for night-coverage after hours (5p -7a ) and weekends on [email or web address]   Patient seen with PA, agree with the above note.   The PICC is not working today.  Excellent diuresis yesterday, weight down again.  BP stable.   General: NAD Neck: JVP 8 cm, no  thyromegaly or thyroid nodule.  Lungs: Clear to auscultation bilaterally with normal respiratory effort. CV: Nondisplaced PMI.  Heart regular S1/S2, no S3/S4, 1/6 HSM LLSB/apex.  Trace ankle edema.  Abdomen: Soft, nontender, no hepatosplenomegaly, no distention.  Skin: Intact without lesions or rashes.  Neurologic: Alert and oriented x 3.  Psych: Normal affect. Extremities: No clubbing or cyanosis.  HEENT: Normal.   Volume status looks better.  IV team to trouble-shoot PICC, then will send co-ox and check CVP. Creatinine improved.  - For now, will give 1 more dose of Lasix 120 mg IV then hold.  Will check CVP when line works.  - Decrease milrinone to 0.125.  - Continue spironolactone, digoxin, Wilder Glade.  - Will add low dose losartan 12.5 mg daily.  - LHC/RHC tomorrow.   Mobilize.   Loralie Champagne 12/11/2022 9:55 AM

## 2022-12-11 NOTE — Progress Notes (Signed)
Mobility Specialist Progress Note:   12/11/22 1020  Mobility  Activity Ambulated with assistance in hallway  Level of Assistance Independent after set-up  Assistive Device  (IV pole)  Distance Ambulated (ft) 200 ft  Activity Response Tolerated well  $Mobility charge 1 Mobility   During Mobility: 124 HR Post Mobility:   108 HR  Pt EOB willing to participate in mobility. No complaints of pain. Left EOB with call bell in reach and all needs met.   Gareth Eagle Adelaine Roppolo Mobility Specialist Please contact via [employer] or  Rehab Office at [phone]

## 2022-12-12 ENCOUNTER — Inpatient Hospital Stay (HOSPITAL_COMMUNITY): Payer: 59

## 2022-12-12 ENCOUNTER — Inpatient Hospital Stay: Payer: Self-pay

## 2022-12-12 ENCOUNTER — Encounter (HOSPITAL_COMMUNITY)
Admission: EM | Disposition: A | Discharge status: Home / Self Care | Payer: Self-pay | Source: Home / Self Care | Attending: Cardiovascular Disease

## 2022-12-12 ENCOUNTER — Ambulatory Visit: Payer: 59 | Admitting: Nurse Practitioner

## 2022-12-12 DIAGNOSIS — I5021 Acute systolic (congestive) heart failure: Secondary | ICD-10-CM | POA: Diagnosis not present

## 2022-12-12 LAB — BASIC METABOLIC PANEL
Anion gap: 10 (ref 5–15)
BUN: 22 mg/dL — ABNORMAL HIGH (ref 6–20)
CO2: 29 mmol/L (ref 22–32)
Calcium: 9 mg/dL (ref 8.9–10.3)
Chloride: 90 mmol/L — ABNORMAL LOW (ref 98–111)
Creatinine, Ser: 2.14 mg/dL — ABNORMAL HIGH (ref 0.44–1.00)
GFR, Estimated: 27 mL/min — ABNORMAL LOW (ref >60)
Glucose, Bld: 115 mg/dL — ABNORMAL HIGH (ref 70–99)
Potassium: 3.8 mmol/L (ref 3.5–5.1)
Sodium: 129 mmol/L — ABNORMAL LOW (ref 135–145)

## 2022-12-12 LAB — CBC WITH DIFFERENTIAL/PLATELET
Abs Immature Granulocytes: 0.05 10*3/uL (ref 0.00–0.07)
Basophils Absolute: 0 10*3/uL (ref 0.0–0.1)
Basophils Relative: 0 %
Eosinophils Absolute: 0.3 10*3/uL (ref 0.0–0.5)
Eosinophils Relative: 2 %
HCT: 34.9 % — ABNORMAL LOW (ref 36.0–46.0)
Hemoglobin: 11 g/dL — ABNORMAL LOW (ref 12.0–15.0)
Immature Granulocytes: 0 %
Lymphocytes Relative: 23 %
Lymphs Abs: 3.2 10*3/uL (ref 0.7–4.0)
MCH: 22.2 pg — ABNORMAL LOW (ref 26.0–34.0)
MCHC: 31.5 g/dL (ref 30.0–36.0)
MCV: 70.5 fL — ABNORMAL LOW (ref 80.0–100.0)
Monocytes Absolute: 1.8 10*3/uL — ABNORMAL HIGH (ref 0.1–1.0)
Monocytes Relative: 13 %
Neutro Abs: 8.5 10*3/uL — ABNORMAL HIGH (ref 1.7–7.7)
Neutrophils Relative %: 62 %
Platelets: 334 10*3/uL (ref 150–400)
RBC: 4.95 MIL/uL (ref 3.87–5.11)
RDW: 19.9 % — ABNORMAL HIGH (ref 11.5–15.5)
WBC: 13.7 10*3/uL — ABNORMAL HIGH (ref 4.0–10.5)
nRBC: 0 % (ref 0.0–0.2)

## 2022-12-12 LAB — COOXEMETRY PANEL
Carboxyhemoglobin: 1.4 % (ref 0.5–1.5)
Methemoglobin: <0.7 % (ref 0.0–1.5)
O2 Saturation: 58.6 %
Total hemoglobin: 12.2 g/dL (ref 12.0–16.0)

## 2022-12-12 LAB — LACTIC ACID, PLASMA
Lactic Acid, Venous: 1.4 mmol/L (ref 0.5–1.9)
Lactic Acid, Venous: 1.2 mmol/L (ref 0.5–1.9)

## 2022-12-12 LAB — MAGNESIUM: Magnesium: 2.3 mg/dL (ref 1.7–2.4)

## 2022-12-12 SURGERY — RIGHT/LEFT HEART CATH AND CORONARY ANGIOGRAPHY
Anesthesia: LOCAL

## 2022-12-12 MED ORDER — DIGOXIN 125 MCG PO TABS
0.1250 mg | ORAL_TABLET | Freq: Every day | ORAL | Status: DC
  Administered 2022-12-13: 0.125 mg via ORAL
  Filled 2022-12-12: qty 1

## 2022-12-12 MED ORDER — SPIRONOLACTONE 25 MG PO TABS
25.0000 mg | ORAL_TABLET | Freq: Every day | ORAL | Status: DC
  Administered 2022-12-13: 25 mg via ORAL
  Filled 2022-12-12: qty 1

## 2022-12-12 MED ORDER — EMPAGLIFLOZIN 10 MG PO TABS
10.0000 mg | ORAL_TABLET | Freq: Every day | ORAL | Status: DC
  Filled 2022-12-12: qty 1

## 2022-12-12 NOTE — H&P (View-Only) (Signed)
Patient ID: Stephanie Gates, female   DOB: [birth year], 57 y.o.   MRN: [id number]  PCP-Cardiologist: None   Subjective:    Milrinone stopped overnight for hypotension, recorded BP 64/33. SCr bumped 1.29>>2.14.  BP now stable. Pt asymptomatic.   Prior to discontinuation of milrinone, last co-ox 64%. Unfortunately, PICC line not working correctly this morning. IV team unable to restore line patency. CXR ordered to check placement.   This morning, pt A&Ox3. No dyspnea. No CP.   WBC climbing but afebrile, 11.8>>13.7K. Denies f/c. No dysuria.   Lactic acid 1.4>>pending. CO2 29      RHC:  RA = 22 RV = 49/22 PA = 49/26 (33) PCW = 23  Fick cardiac output/index = 6.1/2.6 TD CO/CI = 4.1/1.7 PVR = 1.6 (Fick) 2.5 (TD) Ao sat = 99% PA sat = 64%, 66% SVC sat = 65% PaPI = 1.0  Echo: EF < 20%, severe LV dilation, mildly decreased RV systolic function, moderate-severe functional MR, severe TR.    Objective:   Weight Range: 119.2 kg Body mass index is 43.73 kg/m.   Vital Signs:   Temp:  [98 F (36.7 C)-98.1 F (36.7 C)] 98.1 F (36.7 C) (01/04 0444) Pulse Rate:  [25-152] 96 (01/04 0444) Resp:  [9-43] 20 (01/04 0444) BP: (64-116)/(33-78) 116/64 (01/04 0444) SpO2:  [80 %-100 %] 100 % (01/04 0444) Weight:  [119.2 kg] 119.2 kg (01/04 0444) Last BM Date : 12/09/22  Weight change: Filed Weights   12/10/22 0342 12/11/22 0659 12/12/22 0444  Weight: 125.1 kg 118.5 kg 119.2 kg    Intake/Output:   Intake/Output Summary (Last 24 hours) at 12/12/2022 0716 Last data filed at 12/12/2022 0539 Gross per 24 hour  Intake 76.25 ml  Output --  Net 76.25 ml      Physical Exam   General:  fatigued appearing. No respiratory difficulty HEENT: normal Neck: supple. JVD 7 cm. Carotids 2+ bilat; no bruits. No lymphadenopathy or thyromegaly appreciated. Cor: PMI nondisplaced. Regular rate & rhythm. 2/6 HSM LLSB  Lungs: clear, no wheezing  Abdomen: soft, nontender, nondistended. No  hepatosplenomegaly. No bruits or masses. Good bowel sounds. Extremities: no cyanosis, clubbing, rash, trace b/l edema + unna boots  Neuro: alert & oriented x 3, cranial nerves grossly intact. moves all 4 extremities w/o difficulty. Affect pleasant.   Telemetry   SR/ST 90s-100s, 4 beat run NSVT (personally reviewed)   Labs    CBC Recent Labs    12/11/22 0607 12/12/22 0111  WBC 11.8* 13.7*  NEUTROABS 7.8* 8.5*  HGB 10.8* 11.0*  HCT 34.2* 34.9*  MCV 70.4* 70.5*  PLT 286 191   Basic Metabolic Panel Recent Labs    12/11/22 0607 12/12/22 0111  NA 133* 129*  K 3.7 3.8  CL 90* 90*  CO2 31 29  GLUCOSE 92 115*  BUN 15 22*  CREATININE 1.29* 2.14*  CALCIUM 9.2 9.0  MG 2.0 2.3   Liver Function Tests No results for input(s): "AST", "ALT", "ALKPHOS", "BILITOT", "PROT", "ALBUMIN" in the last 72 hours.  No results for input(s): "LIPASE", "AMYLASE" in the last 72 hours. Cardiac Enzymes No results for input(s): "CKTOTAL", "CKMB", "CKMBINDEX", "TROPONINI" in the last 72 hours.  BNP: BNP (last 3 results) Recent Labs    11/30/22 2119  BNP 777.6*    ProBNP (last 3 results) No results for input(s): "PROBNP" in the last 8760 hours.   D-Dimer No results for input(s): "DDIMER" in the last 72 hours. Hemoglobin A1C No results for input(s): "HGBA1C" in  the last 72 hours. Fasting Lipid Panel No results for input(s): "CHOL", "HDL", "LDLCALC", "TRIG", "CHOLHDL", "LDLDIRECT" in the last 72 hours. Thyroid Function Tests No results for input(s): "TSH", "T4TOTAL", "T3FREE", "THYROIDAB" in the last 72 hours.  Invalid input(s): "FREET3"  Other results:   Imaging    No results found.   Medications:     Scheduled Medications:  aspirin  81 mg Oral Daily   atorvastatin  40 mg Oral Daily   Chlorhexidine Gluconate Cloth  6 each Topical Daily   digoxin  0.125 mg Oral Daily   docusate sodium  100 mg Oral BID   empagliflozin  10 mg Oral Daily   enoxaparin (LOVENOX) injection   40 mg Subcutaneous Q24H   multivitamin with minerals  1 tablet Oral Daily   pantoprazole  40 mg Oral Daily   Ensure Max Protein  11 oz Oral BID   sodium chloride flush  10-40 mL Intracatheter Q12H   sodium chloride flush  3 mL Intravenous Q12H   sodium chloride flush  3 mL Intravenous Q12H   sodium chloride flush  3 mL Intravenous Q12H   sodium chloride flush  3 mL Intravenous Q12H   spironolactone  25 mg Oral Daily    Infusions:  sodium chloride     sodium chloride     sodium chloride 10 mL/hr at 12/12/22 0539   milrinone 0.125 mcg/kg/min (12/11/22 1253)    PRN Medications: sodium chloride, sodium chloride, acetaminophen, albuterol, alum & mag hydroxide-simeth, bisacodyl, guaiFENesin-dextromethorphan, hydrALAZINE, nitroGLYCERIN, ondansetron (ZOFRAN) IV, ondansetron **OR** [DISCONTINUED] ondansetron (ZOFRAN) IV, oxyCODONE, polyethylene glycol, sodium chloride flush, sodium chloride flush, sodium chloride flush, traZODone    Assessment/Plan   1. Acute on chronic systolic CHF: Echo this admission with EF < 20%, severe LV dilation, mildly decreased RV systolic function, moderate-severe functional MR, severe TR.  RHC with low output and biventricular failure.  NYHA class IV at admission, weight up > 30 lbs last few months.  Uncertain cause of cardiomyopathy, ?LBBB vs HTN vs viral myocarditis.  LBBB is not markedly wide but dyssynchrony on echo. No family history of CHF. Diuresed well w/ IV Lasix and milrinone. Diuresed > 40 lb. Milrinone weaned yesterday and discontinued due to hypotension. Last co-ox 64%. PICC currently not working to check f/u co-ox and CVP. Euvolemic on exam. SCr back up, 1.29>>2.14 today. LA 1.4>>pending - cancel plans for [facility] today given AKI  - need to restore PICC line access for Co-ox and CVP monitoring. Obtain CXR to check PICC line placement  - hold losartan, spiro, Jardiance and digoxin for now given AKI and soft BP  - No beta blocker with low-output - Unna  boots.  - Has received 2 doses of Ferrlicit, so will have to wait for cardiac MRI.  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 Elevated Hs-TnI: 895>917>720>397>382.   - Completed 48 hrs heparin gtt.  - Continue ASA/statin.  - Plan R/LHC once AKI resolves  3. 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  Follow with diuresis and GDMT (repeat echo in future).  4. 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   5. HTN: BP not elevated. Episodes of hypotension this admit - holding GDMT for now  6. AKI: Suspect cardiorenal, baseline creatinine around 1.  Creatinine up to 2.1 and improved to 1.29 with inotrope support and  diuresis. Scr now back up today to 2.1 after hypotension and weaning of milrinone.  - hold diuretics today - avoid hypotension  - f/u BMP  7. Elevated LFTs: Suspect congestive hepatopathy, improving.   Length of Stay: [address] PA-C  12/12/2022, 7:16 AM  Advanced Heart Failure Team Pager [phone] (M-F; 7a - 5p)  Please contact [facility] for night-coverage after hours (5p -7a ) and weekends on [email or web address]   Patient seen with PA, agree with the above note.   PICC line not functional this morning.  Co-ox yesterday pm on milrinone 0.125 was 64%.  SBP dropped to 60s overnight, milrinone was stopped.  Patient denies lightheadedness.  Creatinine jumped to 2.14.   This morning, last BP 116/64.  She has walked in the hall without dyspnea or lightheadedness.   General: NAD Neck: Thick, JVP not clearly elevated, no thyromegaly or thyroid nodule.  Lungs: Clear to auscultation bilaterally with normal respiratory effort. CV: Nondisplaced PMI.  Heart regular S1/S2, no S3/S4, 1/6 HSM LLSB/apex.  No peripheral edema.  N Abdomen: Soft, nontender, no hepatosplenomegaly, no distention.   Skin: Intact without lesions or rashes.  Neurologic: Alert and oriented x 3.  Psych: Normal affect. Extremities: No clubbing or cyanosis.  HEENT: Normal.   BP has dropped twice now, both times after having tried to start low dose losartan in the setting of milrinone use.  Last co-ox on milrinone 0.125 was 64%, now off milrinone but PICC non-functional.  Volume status looks ok on exam, weight down considerably from admission.  - Will leave off milrinone and try to get PICC line functioning, will get CXR and get PICC team involved.  Get co-ox when PICC functional. - Will hold off on ARB/ARNI for now, BP has not tolerated.  - Hold digoxin today, restart tomorrow.  - Hold spironolactone today, restart tomorrow. - Hold Jardiance today, restart tomorrow.  - No cath today with AKI.  - No diuretic today with AKI, will assess CVP when PICC functioning, start po diuretic tomorrow if creatinine trends down.   Loralie Champagne 12/12/2022 8:59 AM

## 2022-12-12 NOTE — Progress Notes (Signed)
0015 Pt Bp 64/33, P 97,RR 18. Pt denies any symptoms. Suleiman,MD notified. Awaiting response.  0020 Orders given.   0145 Pt PICC lumen(both) lost patency. IV team notified. Suleiman,MD notified.

## 2022-12-12 NOTE — Progress Notes (Signed)
Peripherally Inserted Central Catheter Placement  The IV Nurse has discussed with the patient and/or persons authorized to consent for the patient, the purpose of this procedure and the potential benefits and risks involved with this procedure.  The benefits include less needle sticks, lab draws from the catheter, and the patient may be discharged home with the catheter. Risks include, but not limited to, infection, bleeding, blood clot (thrombus formation), and puncture of an artery; nerve damage and irregular heartbeat and possibility to perform a PICC exchange if needed/ordered by physician.  Alternatives to this procedure were also discussed.  Bard Power PICC patient education guide, fact sheet on infection prevention and patient information card has been provided to patient /or left at bedside.   PICC was exchanged per protocol by Valentina Shaggy RN PICC Placement Documentation  PICC Double Lumen 12/12/22 Right Brachial 36 cm 0 cm (Active)  Indication for Insertion or Continuance of Line Prolonged intravenous therapies 12/12/22 1331  Exposed Catheter (cm) 0 cm 12/12/22 1331  Site Assessment Clean, Dry, Intact 12/12/22 1331  Lumen #1 Status Flushed;Saline locked;Blood return noted 12/12/22 1331  Lumen #2 Status Flushed;Blood return noted;Saline locked 12/12/22 1331  Dressing Type Transparent;Securing device 12/12/22 1331  Dressing Status Antimicrobial disc in place 12/12/22 1331  Safety Lock Not Applicable 42/59/56 3875  Dressing Change Due 12/19/22 12/12/22 1331       Frances Maywood 12/12/2022, 1:33 PM

## 2022-12-12 NOTE — TOC Progression Note (Signed)
Transition of Care [facility]) - Progression Note    Patient Details  Name: Stephanie Gates MRN: [id number] Date of Birth: [birth year]  [facility] CM/SW Contact  Erenest Rasher, RN Phone Number: [phone] 12/12/2022, 2:35 PM  Clinical Narrative:     Pt was able to provide FMLA paperwork to be completed for her employer. Will take paperwork to HF Clinic team to complete. Will continue to follow for dc needs.   Expected Discharge Plan: Home/Self Care Barriers to Discharge: Continued Medical Work up  Expected Discharge Plan and Services   Discharge Planning Services: CM Consult   Living arrangements for the past 2 months: [place] Determinants of Health (SDOH) Interventions [place]: Low Risk  (12/05/2022)  Transportation Needs: No Transportation Needs (12/05/2022)  Alcohol Screen: Low Risk  (09/18/2022)  Depression (PHQ2-9): Low Risk  (09/18/2022)  Financial Resource Strain: Low Risk  (12/05/2022)  Tobacco Use: Low Risk  (12/06/2022)    Readmission Risk Interventions     No data to display

## 2022-12-12 NOTE — Plan of Care (Signed)
  Problem: Education: Goal: Knowledge of General Education information will improve Description: Including pain rating scale, medication(s)/side effects and non-pharmacologic comfort measures Outcome: Progressing   Problem: Health Behavior/Discharge Planning: Goal: Ability to manage health-related needs will improve Outcome: Progressing   Problem: Clinical Measurements: Goal: Will remain free from infection Outcome: Progressing   Problem: Pain Managment: Goal: General experience of comfort will improve Outcome: Progressing   Problem: Skin Integrity: Goal: Risk for impaired skin integrity will decrease Outcome: Progressing

## 2022-12-12 NOTE — Progress Notes (Addendum)
Patient ID: Stephanie Gates, female   DOB: 24-Jun-1966, 57 y.o.   MRN: [id number]  PCP-Cardiologist: None   Subjective:    Milrinone stopped overnight for hypotension, recorded BP 64/33. SCr bumped 1.29>>2.14.  BP now stable. Pt asymptomatic.   Prior to discontinuation of milrinone, last co-ox 64%. Unfortunately, PICC line not working correctly this morning. IV team unable to restore line patency. CXR ordered to check placement.   This morning, pt A&Ox3. No dyspnea. No CP.   WBC climbing but afebrile, 11.8>>13.7K. Denies f/c. No dysuria.   Lactic acid 1.4>>pending. CO2 29      RHC:  RA = 22 RV = 49/22 PA = 49/26 (33) PCW = 23  Fick cardiac output/index = 6.1/2.6 TD CO/CI = 4.1/1.7 PVR = 1.6 (Fick) 2.5 (TD) Ao sat = 99% PA sat = 64%, 66% SVC sat = 65% PaPI = 1.0  Echo: EF < 20%, severe LV dilation, mildly decreased RV systolic function, moderate-severe functional MR, severe TR.    Objective:   Weight Range: 119.2 kg Body mass index is 43.73 kg/m.   Vital Signs:   Temp:  [98 F (36.7 C)-98.1 F (36.7 C)] 98.1 F (36.7 C) (01/04 0444) Pulse Rate:  [25-152] 96 (01/04 0444) Resp:  [9-43] 20 (01/04 0444) BP: (64-116)/(33-78) 116/64 (01/04 0444) SpO2:  [80 %-100 %] 100 % (01/04 0444) Weight:  [119.2 kg] 119.2 kg (01/04 0444) Last BM Date : 12/09/22  Weight change: Filed Weights   12/10/22 0342 12/11/22 0659 12/12/22 0444  Weight: 125.1 kg 118.5 kg 119.2 kg    Intake/Output:   Intake/Output Summary (Last 24 hours) at 12/12/2022 0716 Last data filed at 12/12/2022 0539 Gross per 24 hour  Intake 76.25 ml  Output --  Net 76.25 ml      Physical Exam   General:  fatigued appearing. No respiratory difficulty HEENT: normal Neck: supple. JVD 7 cm. Carotids 2+ bilat; no bruits. No lymphadenopathy or thyromegaly appreciated. Cor: PMI nondisplaced. Regular rate & rhythm. 2/6 HSM LLSB  Lungs: clear, no wheezing  Abdomen: soft, nontender, nondistended. No  hepatosplenomegaly. No bruits or masses. Good bowel sounds. Extremities: no cyanosis, clubbing, rash, trace b/l edema + unna boots  Neuro: alert & oriented x 3, cranial nerves grossly intact. moves all 4 extremities w/o difficulty. Affect pleasant.   Telemetry   SR/ST 90s-100s, 4 beat run NSVT (personally reviewed)   Labs    CBC Recent Labs    12/11/22 0607 12/12/22 0111  WBC 11.8* 13.7*  NEUTROABS 7.8* 8.5*  HGB 10.8* 11.0*  HCT 34.2* 34.9*  MCV 70.4* 70.5*  PLT 286 409   Basic Metabolic Panel Recent Labs    12/11/22 0607 12/12/22 0111  NA 133* 129*  K 3.7 3.8  CL 90* 90*  CO2 31 29  GLUCOSE 92 115*  BUN 15 22*  CREATININE 1.29* 2.14*  CALCIUM 9.2 9.0  MG 2.0 2.3   Liver Function Tests No results for input(s): "AST", "ALT", "ALKPHOS", "BILITOT", "PROT", "ALBUMIN" in the last 72 hours.  No results for input(s): "LIPASE", "AMYLASE" in the last 72 hours. Cardiac Enzymes No results for input(s): "CKTOTAL", "CKMB", "CKMBINDEX", "TROPONINI" in the last 72 hours.  BNP: BNP (last 3 results) Recent Labs    11/30/22 2119  BNP 777.6*    ProBNP (last 3 results) No results for input(s): "PROBNP" in the last 8760 hours.   D-Dimer No results for input(s): "DDIMER" in the last 72 hours. Hemoglobin A1C No results for input(s): "HGBA1C" in  the last 72 hours. Fasting Lipid Panel No results for input(s): "CHOL", "HDL", "LDLCALC", "TRIG", "CHOLHDL", "LDLDIRECT" in the last 72 hours. Thyroid Function Tests No results for input(s): "TSH", "T4TOTAL", "T3FREE", "THYROIDAB" in the last 72 hours.  Invalid input(s): "FREET3"  Other results:   Imaging    No results found.   Medications:     Scheduled Medications:  aspirin  81 mg Oral Daily   atorvastatin  40 mg Oral Daily   Chlorhexidine Gluconate Cloth  6 each Topical Daily   digoxin  0.125 mg Oral Daily   docusate sodium  100 mg Oral BID   empagliflozin  10 mg Oral Daily   enoxaparin (LOVENOX) injection   40 mg Subcutaneous Q24H   multivitamin with minerals  1 tablet Oral Daily   pantoprazole  40 mg Oral Daily   Ensure Max Protein  11 oz Oral BID   sodium chloride flush  10-40 mL Intracatheter Q12H   sodium chloride flush  3 mL Intravenous Q12H   sodium chloride flush  3 mL Intravenous Q12H   sodium chloride flush  3 mL Intravenous Q12H   sodium chloride flush  3 mL Intravenous Q12H   spironolactone  25 mg Oral Daily    Infusions:  sodium chloride     sodium chloride     sodium chloride 10 mL/hr at 12/12/22 0539   milrinone 0.125 mcg/kg/min (12/11/22 1253)    PRN Medications: sodium chloride, sodium chloride, acetaminophen, albuterol, alum & mag hydroxide-simeth, bisacodyl, guaiFENesin-dextromethorphan, hydrALAZINE, nitroGLYCERIN, ondansetron (ZOFRAN) IV, ondansetron **OR** [DISCONTINUED] ondansetron (ZOFRAN) IV, oxyCODONE, polyethylene glycol, sodium chloride flush, sodium chloride flush, sodium chloride flush, traZODone    Assessment/Plan   1. Acute on chronic systolic CHF: Echo this admission with EF < 20%, severe LV dilation, mildly decreased RV systolic function, moderate-severe functional MR, severe TR.  RHC with low output and biventricular failure.  NYHA class IV at admission, weight up > 30 lbs last few months.  Uncertain cause of cardiomyopathy, ?LBBB vs HTN vs viral myocarditis.  LBBB is not markedly wide but dyssynchrony on echo. No family history of CHF. Diuresed well w/ IV Lasix and milrinone. Diuresed > 40 lb. Milrinone weaned yesterday and discontinued due to hypotension. Last co-ox 64%. PICC currently not working to check f/u co-ox and CVP. Euvolemic on exam. SCr back up, 1.29>>2.14 today. LA 1.4>>pending - cancel plans for [facility] today given AKI  - need to restore PICC line access for Co-ox and CVP monitoring. Obtain CXR to check PICC line placement  - hold losartan, spiro, Jardiance and digoxin for now given AKI and soft BP  - No beta blocker with low-output - Unna  boots.  - Has received 2 doses of Ferrlicit, so will have to wait for cardiac MRI.  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 Elevated Hs-TnI: 895>917>720>397>382.   - Completed 48 hrs heparin gtt.  - Continue ASA/statin.  - Plan R/LHC once AKI resolves  3. 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  Follow with diuresis and GDMT (repeat echo in future).  4. 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   5. HTN: BP not elevated. Episodes of hypotension this admit - holding GDMT for now  6. AKI: Suspect cardiorenal, baseline creatinine around 1.  Creatinine up to 2.1 and improved to 1.29 with inotrope support and  diuresis. Scr now back up today to 2.1 after hypotension and weaning of milrinone.  - hold diuretics today - avoid hypotension  - f/u BMP  7. Elevated LFTs: Suspect congestive hepatopathy, improving.   Length of Stay: [address], PA-C  12/12/2022, 7:16 AM  Advanced Heart Failure Team Pager [phone] (M-F; 7a - 5p)  Please contact [facility] for night-coverage after hours (5p -7a ) and weekends on [email or web address]   Patient seen with PA, agree with the above note.   PICC line not functional this morning.  Co-ox yesterday pm on milrinone 0.125 was 64%.  SBP dropped to 60s overnight, milrinone was stopped.  Patient denies lightheadedness.  Creatinine jumped to 2.14.   This morning, last BP 116/64.  She has walked in the hall without dyspnea or lightheadedness.   General: NAD Neck: Thick, JVP not clearly elevated, no thyromegaly or thyroid nodule.  Lungs: Clear to auscultation bilaterally with normal respiratory effort. CV: Nondisplaced PMI.  Heart regular S1/S2, no S3/S4, 1/6 HSM LLSB/apex.  No peripheral edema.  N Abdomen: Soft, nontender, no hepatosplenomegaly, no distention.   Skin: Intact without lesions or rashes.  Neurologic: Alert and oriented x 3.  Psych: Normal affect. Extremities: No clubbing or cyanosis.  HEENT: Normal.   BP has dropped twice now, both times after having tried to start low dose losartan in the setting of milrinone use.  Last co-ox on milrinone 0.125 was 64%, now off milrinone but PICC non-functional.  Volume status looks ok on exam, weight down considerably from admission.  - Will leave off milrinone and try to get PICC line functioning, will get CXR and get PICC team involved.  Get co-ox when PICC functional. - Will hold off on ARB/ARNI for now, BP has not tolerated.  - Hold digoxin today, restart tomorrow.  - Hold spironolactone today, restart tomorrow. - Hold Jardiance today, restart tomorrow.  - No cath today with AKI.  - No diuretic today with AKI, will assess CVP when PICC functioning, start po diuretic tomorrow if creatinine trends down.   Loralie Champagne 12/12/2022 8:59 AM

## 2022-12-12 NOTE — Progress Notes (Signed)
   12/12/22 0005  Assess: MEWS Score  Temp 98.1 F (36.7 C)  BP (!) 64/33  MAP (mmHg) (!) 43  Pulse Rate 99  ECG Heart Rate 97  Resp 18  Level of Consciousness Alert  SpO2 100 %  O2 Device Room Air  Patient Activity (if Appropriate) In bed  Assess: MEWS Score  MEWS Temp 0  MEWS Systolic 3  MEWS Pulse 0  MEWS RR 0  MEWS LOC 0  MEWS Score 3  MEWS Score Color Yellow  Assess: if the MEWS score is Yellow or Red  Were vital signs taken at a resting state? Yes  Focused Assessment No change from prior assessment  Does the patient meet 2 or more of the SIRS criteria? No  Does the patient have a confirmed or suspected source of infection? No  Provider and Rapid Response Notified? Yes  MEWS guidelines implemented *See Row Information* Yes  Treat  MEWS Interventions Escalated (See documentation below)  Pain Scale 0-10  Pain Score 0  Take Vital Signs  Increase Vital Sign Frequency  Yellow: Q 2hr X 2 then Q 4hr X 2, if remains yellow, continue Q 4hrs  Escalate  MEWS: Escalate Yellow: discuss with charge nurse/RN and consider discussing with provider and RRT  Notify: Charge Nurse/RN  Name of Charge Nurse/RN Notified Shree,RN  Date Charge Nurse/RN Notified 12/12/22  Time Charge Nurse/RN Notified 0005  Provider Notification  Provider Name/Title Suleiman,MD  Date Provider Notified 12/12/22  Time Provider Notified 0010  Method of Notification Page  Notification Reason Critical Result  Provider response See new orders  Date of Provider Response 12/12/22  Time of Provider Response 0020  Document  Patient Outcome Other (Comment)  Progress note created (see row info) Yes  Assess: SIRS CRITERIA  SIRS Temperature  0  SIRS Pulse 1  SIRS Respirations  0  SIRS WBC 0  SIRS Score Sum  1

## 2022-12-12 NOTE — Progress Notes (Signed)
IV team unable to restore PICC line patency. Stanford Breed, MD notified. Chest X-ray ordered per verbal order from Stanford Breed, MD. Lab notified. Pt is currently Lab draw.

## 2022-12-12 NOTE — Progress Notes (Signed)
Unna boots removed and ortho tech called to replace

## 2022-12-13 ENCOUNTER — Encounter (HOSPITAL_COMMUNITY)
Admission: EM | Disposition: A | Discharge status: Home / Self Care | Payer: Self-pay | Source: Home / Self Care | Attending: Cardiovascular Disease

## 2022-12-13 ENCOUNTER — Encounter: Payer: Self-pay | Admitting: Hematology and Oncology

## 2022-12-13 ENCOUNTER — Other Ambulatory Visit (HOSPITAL_COMMUNITY): Payer: Self-pay

## 2022-12-13 DIAGNOSIS — I5021 Acute systolic (congestive) heart failure: Secondary | ICD-10-CM | POA: Diagnosis not present

## 2022-12-13 HISTORY — PX: RIGHT/LEFT HEART CATH AND CORONARY ANGIOGRAPHY: CATH118266

## 2022-12-13 LAB — POCT I-STAT EG7
Acid-Base Excess: 5 mmol/L — ABNORMAL HIGH (ref 0.0–2.0)
Acid-Base Excess: 5 mmol/L — ABNORMAL HIGH (ref 0.0–2.0)
Bicarbonate: 29.5 mmol/L — ABNORMAL HIGH (ref 20.0–28.0)
Bicarbonate: 29.9 mmol/L — ABNORMAL HIGH (ref 20.0–28.0)
Calcium, Ion: 1.21 mmol/L (ref 1.15–1.40)
Calcium, Ion: 1.22 mmol/L (ref 1.15–1.40)
HCT: 39 % (ref 36.0–46.0)
HCT: 39 % (ref 36.0–46.0)
Hemoglobin: 13.3 g/dL (ref 12.0–15.0)
Hemoglobin: 13.3 g/dL (ref 12.0–15.0)
O2 Saturation: 69 %
O2 Saturation: 71 %
Potassium: 3.8 mmol/L (ref 3.5–5.1)
Potassium: 3.9 mmol/L (ref 3.5–5.1)
Sodium: 134 mmol/L — ABNORMAL LOW (ref 135–145)
Sodium: 134 mmol/L — ABNORMAL LOW (ref 135–145)
TCO2: 31 mmol/L (ref 22–32)
TCO2: 31 mmol/L (ref 22–32)
pCO2, Ven: 43.6 mmHg — ABNORMAL LOW (ref 44–60)
pCO2, Ven: 44.6 mmHg (ref 44–60)
pH, Ven: 7.435 — ABNORMAL HIGH (ref 7.25–7.43)
pH, Ven: 7.439 — ABNORMAL HIGH (ref 7.25–7.43)
pO2, Ven: 35 mmHg (ref 32–45)
pO2, Ven: 36 mmHg (ref 32–45)

## 2022-12-13 LAB — CBC WITH DIFFERENTIAL/PLATELET
Abs Immature Granulocytes: 0.03 10*3/uL (ref 0.00–0.07)
Basophils Absolute: 0 10*3/uL (ref 0.0–0.1)
Basophils Relative: 0 %
Eosinophils Absolute: 0.3 10*3/uL (ref 0.0–0.5)
Eosinophils Relative: 3 %
HCT: 36.4 % (ref 36.0–46.0)
Hemoglobin: 11.5 g/dL — ABNORMAL LOW (ref 12.0–15.0)
Immature Granulocytes: 0 %
Lymphocytes Relative: 28 %
Lymphs Abs: 3.2 10*3/uL (ref 0.7–4.0)
MCH: 22.2 pg — ABNORMAL LOW (ref 26.0–34.0)
MCHC: 31.6 g/dL (ref 30.0–36.0)
MCV: 70.3 fL — ABNORMAL LOW (ref 80.0–100.0)
Monocytes Absolute: 1.6 10*3/uL — ABNORMAL HIGH (ref 0.1–1.0)
Monocytes Relative: 14 %
Neutro Abs: 6.1 10*3/uL (ref 1.7–7.7)
Neutrophils Relative %: 55 %
Platelets: 315 10*3/uL (ref 150–400)
RBC: 5.18 MIL/uL — ABNORMAL HIGH (ref 3.87–5.11)
RDW: 20.1 % — ABNORMAL HIGH (ref 11.5–15.5)
WBC: 11.3 10*3/uL — ABNORMAL HIGH (ref 4.0–10.5)
nRBC: 0 % (ref 0.0–0.2)

## 2022-12-13 LAB — BASIC METABOLIC PANEL
Anion gap: 9 (ref 5–15)
BUN: 18 mg/dL (ref 6–20)
CO2: 29 mmol/L (ref 22–32)
Calcium: 9 mg/dL (ref 8.9–10.3)
Chloride: 95 mmol/L — ABNORMAL LOW (ref 98–111)
Creatinine, Ser: 1.12 mg/dL — ABNORMAL HIGH (ref 0.44–1.00)
GFR, Estimated: 58 mL/min — ABNORMAL LOW (ref >60)
Glucose, Bld: 98 mg/dL (ref 70–99)
Potassium: 3.5 mmol/L (ref 3.5–5.1)
Sodium: 133 mmol/L — ABNORMAL LOW (ref 135–145)

## 2022-12-13 LAB — COOXEMETRY PANEL
Carboxyhemoglobin: 1.4 % (ref 0.5–1.5)
Methemoglobin: <0.7 % (ref 0.0–1.5)
O2 Saturation: 69.4 %
Total hemoglobin: 11.6 g/dL — ABNORMAL LOW (ref 12.0–16.0)

## 2022-12-13 LAB — MAGNESIUM: Magnesium: 2.2 mg/dL (ref 1.7–2.4)

## 2022-12-13 SURGERY — RIGHT/LEFT HEART CATH AND CORONARY ANGIOGRAPHY
Anesthesia: LOCAL

## 2022-12-13 MED ORDER — LIDOCAINE HCL (PF) 1 % IJ SOLN
INTRAMUSCULAR | Status: AC
  Filled 2022-12-13: qty 30

## 2022-12-13 MED ORDER — LABETALOL HCL 5 MG/ML IV SOLN: 10.0000 mg | INTRAVENOUS | Status: AC | PRN

## 2022-12-13 MED ORDER — SODIUM CHLORIDE 0.9% FLUSH: 3.0000 mL | INTRAVENOUS | Status: DC | PRN

## 2022-12-13 MED ORDER — IOHEXOL 350 MG/ML SOLN
INTRAVENOUS | Status: DC | PRN
  Administered 2022-12-13: 40 mL

## 2022-12-13 MED ORDER — SODIUM CHLORIDE 0.9 % IV SOLN: 250.0000 mL | INTRAVENOUS | Status: DC | PRN

## 2022-12-13 MED ORDER — ENSURE ENLIVE PO LIQD: 237.0000 mL | Freq: Two times a day (BID) | ORAL | Status: DC

## 2022-12-13 MED ORDER — POTASSIUM CHLORIDE CRYS ER 20 MEQ PO TBCR
20.0000 meq | EXTENDED_RELEASE_TABLET | Freq: Every day | ORAL | 5 refills | Status: DC
  Filled 2022-12-13: qty 30, 30d supply, fill #0

## 2022-12-13 MED ORDER — ONDANSETRON HCL 4 MG/2ML IJ SOLN: 4.0000 mg | Freq: Four times a day (QID) | INTRAMUSCULAR | Status: DC | PRN

## 2022-12-13 MED ORDER — SODIUM CHLORIDE 0.9 % IV SOLN: INTRAVENOUS | Status: DC

## 2022-12-13 MED ORDER — SODIUM CHLORIDE 0.9% FLUSH: 3.0000 mL | Freq: Two times a day (BID) | INTRAVENOUS | Status: DC

## 2022-12-13 MED ORDER — POTASSIUM CHLORIDE CRYS ER 20 MEQ PO TBCR: 20.0000 meq | EXTENDED_RELEASE_TABLET | Freq: Once | ORAL | Status: DC

## 2022-12-13 MED ORDER — VERAPAMIL HCL 2.5 MG/ML IV SOLN
INTRAVENOUS | Status: AC
  Filled 2022-12-13: qty 2

## 2022-12-13 MED ORDER — POTASSIUM CHLORIDE CRYS ER 20 MEQ PO TBCR
40.0000 meq | EXTENDED_RELEASE_TABLET | Freq: Once | ORAL | Status: AC
  Administered 2022-12-13: 40 meq via ORAL
  Filled 2022-12-13: qty 2

## 2022-12-13 MED ORDER — HEPARIN (PORCINE) IN NACL 1000-0.9 UT/500ML-% IV SOLN
INTRAVENOUS | Status: AC
  Filled 2022-12-13: qty 1000

## 2022-12-13 MED ORDER — FENTANYL CITRATE (PF) 100 MCG/2ML IJ SOLN
INTRAMUSCULAR | Status: DC | PRN
  Administered 2022-12-13 (×2): 25 ug via INTRAVENOUS

## 2022-12-13 MED ORDER — LIDOCAINE HCL (PF) 1 % IJ SOLN
INTRAMUSCULAR | Status: DC | PRN
  Administered 2022-12-13: 5 mL via INTRADERMAL
  Administered 2022-12-13: 2 mL via INTRADERMAL

## 2022-12-13 MED ORDER — MIDAZOLAM HCL 2 MG/2ML IJ SOLN
INTRAMUSCULAR | Status: AC
  Filled 2022-12-13: qty 2

## 2022-12-13 MED ORDER — HEPARIN SODIUM (PORCINE) 5000 UNIT/ML IJ SOLN: 5000.0000 [IU] | Freq: Three times a day (TID) | INTRAMUSCULAR | Status: DC

## 2022-12-13 MED ORDER — HEPARIN SODIUM (PORCINE) 1000 UNIT/ML IJ SOLN
INTRAMUSCULAR | Status: DC | PRN
  Administered 2022-12-13: 6000 [IU] via INTRAVENOUS

## 2022-12-13 MED ORDER — EMPAGLIFLOZIN 10 MG PO TABS
10.0000 mg | ORAL_TABLET | Freq: Every day | ORAL | 5 refills | Status: DC
  Filled 2022-12-13: qty 30, 30d supply, fill #0

## 2022-12-13 MED ORDER — EMPAGLIFLOZIN 10 MG PO TABS
10.0000 mg | ORAL_TABLET | Freq: Every day | ORAL | Status: DC
  Filled 2022-12-13: qty 1

## 2022-12-13 MED ORDER — HEPARIN SODIUM (PORCINE) 1000 UNIT/ML IJ SOLN
INTRAMUSCULAR | Status: AC
  Filled 2022-12-13: qty 10

## 2022-12-13 MED ORDER — ACETAMINOPHEN 325 MG PO TABS: 650.0000 mg | ORAL_TABLET | ORAL | Status: DC | PRN

## 2022-12-13 MED ORDER — HEPARIN (PORCINE) IN NACL 1000-0.9 UT/500ML-% IV SOLN
INTRAVENOUS | Status: DC | PRN
  Administered 2022-12-13 (×2): 500 mL

## 2022-12-13 MED ORDER — FUROSEMIDE 40 MG PO TABS
40.0000 mg | ORAL_TABLET | Freq: Every day | ORAL | Status: DC
  Filled 2022-12-13: qty 1

## 2022-12-13 MED ORDER — HYDRALAZINE HCL 20 MG/ML IJ SOLN: 10.0000 mg | INTRAMUSCULAR | Status: AC | PRN

## 2022-12-13 MED ORDER — FUROSEMIDE 40 MG PO TABS
40.0000 mg | ORAL_TABLET | Freq: Every day | ORAL | 5 refills | Status: DC
  Filled 2022-12-13: qty 30, 30d supply, fill #0

## 2022-12-13 MED ORDER — DIGOXIN 125 MCG PO TABS
0.1250 mg | ORAL_TABLET | Freq: Every day | ORAL | 5 refills | Status: DC
  Filled 2022-12-13: qty 30, 30d supply, fill #0

## 2022-12-13 MED ORDER — VERAPAMIL HCL 2.5 MG/ML IV SOLN
INTRAVENOUS | Status: DC | PRN
  Administered 2022-12-13: 10 mL via INTRA_ARTERIAL

## 2022-12-13 MED ORDER — FENTANYL CITRATE (PF) 100 MCG/2ML IJ SOLN
INTRAMUSCULAR | Status: AC
  Filled 2022-12-13: qty 2

## 2022-12-13 MED ORDER — MIDAZOLAM HCL 2 MG/2ML IJ SOLN
INTRAMUSCULAR | Status: DC | PRN
  Administered 2022-12-13: 1 mg via INTRAVENOUS

## 2022-12-13 MED ORDER — SPIRONOLACTONE 25 MG PO TABS
25.0000 mg | ORAL_TABLET | Freq: Every day | ORAL | 5 refills | Status: DC
  Filled 2022-12-13: qty 30, 30d supply, fill #0

## 2022-12-13 SURGICAL SUPPLY — 10 items
CATH 5FR JL3.5 JR4 ANG PIG MP (CATHETERS) IMPLANT
CATH SWAN GANZ 7F STRAIGHT (CATHETERS) IMPLANT
DEVICE RAD COMP TR BAND LRG (VASCULAR PRODUCTS) IMPLANT
GLIDESHEATH SLEND SS 6F .021 (SHEATH) IMPLANT
GLIDESHEATH SLENDER 7FR .021G (SHEATH) IMPLANT
GUIDEWIRE INQWIRE 1.5J.035X260 (WIRE) IMPLANT
INQWIRE 1.5J .035X260CM (WIRE) ×1
KIT HEART LEFT (KITS) ×1 IMPLANT
PACK CARDIAC CATHETERIZATION (CUSTOM PROCEDURE TRAY) ×1 IMPLANT
TRANSDUCER W/STOPCOCK (MISCELLANEOUS) ×1 IMPLANT

## 2022-12-13 NOTE — Interval H&P Note (Signed)
History and Physical Interval Note:  12/13/2022 1:53 PM  Stephanie Gates  has presented today for surgery, with the diagnosis of heart failure.  The various methods of treatment have been discussed with the patient and family. After consideration of risks, benefits and other options for treatment, the patient has consented to  Procedure(s): RIGHT/LEFT HEART CATH AND CORONARY ANGIOGRAPHY (N/A) as a surgical intervention.  The patient's history has been reviewed, patient examined, no change in status, stable for surgery.  I have reviewed the patient's chart and labs.  Questions were answered to the patient's satisfaction.     Kinzi Frediani [employer]

## 2022-12-13 NOTE — Discharge Summary (Signed)
Advanced Heart Failure Team  Discharge Summary   Patient ID: Stephanie Gates MRN: [id number], DOB/AGE: May 10, 1966 57 y.o. Admit date: 11/30/2022 D/C date:     12/13/2022   Primary Discharge Diagnoses:  Acute Systolic Heart Failure  AKI  LBBB Mitral Regurgitation    Hospital Course:   57 y/o woman with HTN, morbid obesity s/p bariatric surgery, LBBB (130-140 ms) admitted with 3 months HF symptoms. [place] mildly elevated [phone]. Echo EF 20% with dyssynchrony, RV moderately down. Moderate to severe MR. Had CT chest. No PE. Subsequently developed AKI and inability to diurese. Advanced HF team consulted. Underwent RHC which showed markedly elevated filling pressures and low output with RV > LV failure. Started on Milrinone and IV Lasix continued w/ improved diuresis on inotropic support. Diuresed > 40 lb. Weaned off milrinone and transitioned to GDMT. Of note, she developed hypotension and recurrent AKI w/ losartan. This was discontinued. After AKI resolved, she underwent repeat RHC + LHC. Which showed no CAD, normal filling pressures and preserved CO.   On 1/5, pt was last seen and examined by Dr. Aundra Dubin and felt stable for discharge home. Post hospital f/u has been arranged w/ APP. Will need digoxin level checked at hospital follow-up. Dr. Haroldine Laws will be primary MD.     2D Echo 12/02/22 Left ventricular ejection fraction, by estimation, is <20%. The left ventricle has severely decreased function. The left ventricle demonstrates global hypokinesis. The left ventricular internal cavity size was severely dilated. Left ventricular diastolic parameters are consistent with Grade II diastolic dysfunction (pseudonormalization). Elevated left ventricular end-diastolic pressure. 1. Right ventricular systolic function is mildly reduced. The right ventricular size is normal. There is normal pulmonary artery systolic pressure. 2. 3. Left atrial size was mildly dilated. 4. Right atrial size  was mildly dilated. Functional mitral regurgitation due to left ventricular disfunction and annular dilatation. The mitral valve is degenerative. Moderate to severe mitral valve regurgitation. No evidence of mitral stenosis. 5. 6. Tricuspid valve regurgitation is severe. The aortic valve is tricuspid. Aortic valve regurgitation is trivial. No aortic stenosis is present. 7. 8. Pulmonic valve regurgitation is moderate. The inferior vena cava is normal in size with greater than 50% respiratory variability, suggesting right atrial pressure of 3 mmHg.  [place] 12/06/22 Findings:   RA = 22 RV = 49/22 PA = 49/26 (33) PCW = 23  Fick cardiac output/index = 6.1/2.6 TD CO/CI = 4.1/1.7 PVR = 1.6 (Fick) 2.5 (TD) Ao sat = 99% PA sat = 64%, 66% SVC sat = 65% PaPI = 1.0   Assessment: 1. Low output biventricular HF (R>L) with volume overload (suspect TD CO is most accurate)   R/LHC 12/13/22  Right Heart Pressures RHC Procedural Findings: Hemodynamics (mmHg) RA mean 4 RV 41/8 PA 32/16, mean 23 PCWP mean 8 LV 112/16 AO 113/57  Oxygen saturations: PA 70% AO 98%  Cardiac Output (Fick) 6.72  Cardiac Index (Fick) 3.04  Cardiac Output (Thermo) 5.90 Cardiac Index (Thermo) 2.67 PVR 2.5 WU  PAPi 4    Discharge Weight Range: 259 lb  Discharge Vitals: Blood pressure 114/71, pulse 96, temperature 98.3 F (36.8 C), temperature source Oral, resp. rate 16, height '5\' 5"'$  (1.651 m), weight 117.5 kg, last menstrual period 02/11/2017, SpO2 100 %.  Labs: Lab Results  Component Value Date   WBC 11.3 (H) 12/13/2022   HGB 11.5 (L) 12/13/2022   HCT 36.4 12/13/2022   MCV 70.3 (L) 12/13/2022   PLT 315 12/13/2022    Recent Labs  Lab  12/07/22 0535 12/08/22 0502 12/13/22 0500  NA 138   < > 133*  K 3.4*   < > 3.5  CL 93*   < > 95*  CO2 30   < > 29  BUN 21*   < > 18  CREATININE 1.77*   < > 1.12*  CALCIUM 9.6   < > 9.0  PROT 6.6  --   --   BILITOT 1.7*  --   --   ALKPHOS 145*  --   --    ALT 73*  --   --   AST 53*  --   --   GLUCOSE 96   < > 98   < > = values in this interval not displayed.   Lab Results  Component Value Date   CHOL 94 12/03/2022   HDL 33 (L) 12/03/2022   LDLCALC 48 12/03/2022   TRIG 64 12/03/2022   BNP (last 3 results) Recent Labs    11/30/22 2119  BNP 777.6*    ProBNP (last 3 results) No results for input(s): "PROBNP" in the last 8760 hours.   Diagnostic Studies/Procedures   CARDIAC CATHETERIZATION  Result Date: 12/13/2022 1. Normal coronaries, nonischemic cardiomyopathy. 2. Normal filling pressures. 3. Normal cardiac output.   [place] EKG Site Rite  Result Date: 12/12/2022 If Site Rite image not attached, placement could not be confirmed due to current cardiac rhythm.  DG CHEST PORT 1 VIEW  Result Date: 12/12/2022 CLINICAL DATA:  Right-sided PICC placement EXAM: PORTABLE CHEST 1 VIEW COMPARISON:  11/30/2022 FINDINGS: Single frontal view of the chest demonstrates stable enlargement of the cardiac silhouette. Right-sided PICC tip overlies the expected region of the confluence of the bilateral brachiocephalic veins. No airspace disease, effusion, or pneumothorax. No acute bony abnormalities. IMPRESSION: 1. Stable enlarged cardiac silhouette. 2. Right-sided PICC, tip overlying the expected region of the brachiocephalic confluence. Electronically Signed   By: Randa Ngo M.D.   On: 12/12/2022 09:08    Discharge Medications   Allergies as of 12/13/2022       Reactions   Bisoprolol Diarrhea   Penicillin G Itching   Penicillins Itching        Medication List     STOP taking these medications    hydrochlorothiazide 25 MG tablet Commonly known as: HYDRODIURIL       TAKE these medications    digoxin 0.125 MG tablet Commonly known as: LANOXIN Take 1 tablet (0.125 mg total) by mouth daily. Start taking on: December 14, 2022   empagliflozin 10 MG Tabs tablet Commonly known as: JARDIANCE Take 1 tablet (10 mg total) by mouth  daily. Start taking on: December 14, 2022   furosemide 40 MG tablet Commonly known as: LASIX Take 1 tablet (40 mg total) by mouth daily. Start taking on: December 14, 2022   multivitamin capsule Take 1 capsule by mouth daily.   pantoprazole 40 MG tablet Commonly known as: PROTONIX TAKE 1 TABLET(40 MG) BY MOUTH DAILY What changed:  how much to take how to take this when to take this   potassium chloride SA 20 MEQ tablet Commonly known as: KLOR-CON M Take 1 tablet (20 mEq total) by mouth daily. Start taking on: December 14, 2022   spironolactone 25 MG tablet Commonly known as: ALDACTONE Take 1 tablet (25 mg total) by mouth daily. Start taking on: December 14, 2022        Disposition   The patient will be discharged in stable condition to home.   Follow-up Information  [facility]. Go on 12/20/2022.   Specialty: Cardiology Why: Hospital follow up 12/20/2022 @ 12 noon PLEASE bring a current medication list to appointment FREE valet parking, Entrance C, off [employer] information: [address] [id number] [place] [phone]                  Duration of Discharge Encounter: Greater than 35 minutes   Signed, Lyda Jester, PA-C  12/13/2022, 2:51 PM

## 2022-12-13 NOTE — Progress Notes (Signed)
   12/13/22 1200  Mobility  Activity Ambulated independently in hallway  Level of Assistance Independent  Assistive Device Other (Comment) (Iv pole)  Distance Ambulated (ft) 260 ft  Activity Response Tolerated well  Mobility Referral Yes  $Mobility charge 1 Mobility   Mobility Specialist Progress Note  Pre-Mobility: 98% SpO2 During Mobility: 97% SpO2  Received pt in chair having no complaints and agreeable to mobility. Pt was asymptomatic throughout ambulation and returned to room w/o fault. Left in chair w/ call bell in reach and all needs met.  Lucious Groves Mobility Specialist  Please contact via SecureChat or Rehab office at [phone]

## 2022-12-13 NOTE — Progress Notes (Signed)
Fresh blood noted under TR band after 2 cc air removal.  2cc air replaced.  Will cont to monitor closely

## 2022-12-13 NOTE — Progress Notes (Signed)
VAST consulted to dc PICC. However, patient currently has a TR band on right wrist. Unit RN reported all air should be removed by 1730/1800. VAST RN will return after that time to remove PICC.

## 2022-12-13 NOTE — Progress Notes (Signed)
Patient and daughter aware of strict intake and output.

## 2022-12-13 NOTE — Progress Notes (Addendum)
Patient ID: Stephanie Gates, female   DOB: 11-27-1966, 57 y.o.   MRN: [id number]  PCP-Cardiologist: None   Subjective:    Off milrinone. Co-ox stable, 69%. CVP 3   AKI resolved, SCr 2.14>>1.12  No further hypotension.    Feels well. Denies dyspnea. No CP    RHC:  RA = 22 RV = 49/22 PA = 49/26 (33) PCW = 23  Fick cardiac output/index = 6.1/2.6 TD CO/CI = 4.1/1.7 PVR = 1.6 (Fick) 2.5 (TD) Ao sat = 99% PA sat = 64%, 66% SVC sat = 65% PaPI = 1.0  Echo: EF < 20%, severe LV dilation, mildly decreased RV systolic function, moderate-severe functional MR, severe TR.    Objective:   Weight Range: 117.5 kg Body mass index is 43.12 kg/m.   Vital Signs:   Temp:  [97.8 F (36.6 C)-98.3 F (36.8 C)] 98.3 F (36.8 C) (01/05 0810) Pulse Rate:  [92-100] 94 (01/05 0810) Resp:  [16-20] 16 (01/05 0810) BP: (95-131)/(62-88) 120/85 (01/05 0810) SpO2:  [90 %-100 %] 90 % (01/05 0810) Weight:  [117.5 kg] 117.5 kg (01/05 0416) Last BM Date : 12/12/22  Weight change: Filed Weights   12/11/22 0659 12/12/22 0444 12/13/22 0416  Weight: 118.5 kg 119.2 kg 117.5 kg    Intake/Output:   Intake/Output Summary (Last 24 hours) at 12/13/2022 0936 Last data filed at 12/13/2022 0425 Gross per 24 hour  Intake --  Output 500 ml  Net -500 ml      Physical Exam   CVP 3  General:  Well appearing. No respiratory difficulty HEENT: normal Neck: supple. no JVD. Carotids 2+ bilat; no bruits. No lymphadenopathy or thyromegaly appreciated. Cor: PMI nondisplaced. Regular rate & rhythm. No rubs, gallops or murmurs. Lungs: clear Abdomen: soft, nontender, nondistended. No hepatosplenomegaly. No bruits or masses. Good bowel sounds. Extremities: no cyanosis, clubbing, rash, edema + RUE PICC  Neuro: alert & oriented x 3, cranial nerves grossly intact. moves all 4 extremities w/o difficulty. Affect pleasant.   Telemetry   NSR 90s  (personally reviewed)   Labs    CBC Recent Labs     12/12/22 0111 12/13/22 0500  WBC 13.7* 11.3*  NEUTROABS 8.5* 6.1  HGB 11.0* 11.5*  HCT 34.9* 36.4  MCV 70.5* 70.3*  PLT 334 449   Basic Metabolic Panel Recent Labs    12/12/22 0111 12/13/22 0500  NA 129* 133*  K 3.8 3.5  CL 90* 95*  CO2 29 29  GLUCOSE 115* 98  BUN 22* 18  CREATININE 2.14* 1.12*  CALCIUM 9.0 9.0  MG 2.3 2.2   Liver Function Tests No results for input(s): "AST", "ALT", "ALKPHOS", "BILITOT", "PROT", "ALBUMIN" in the last 72 hours.  No results for input(s): "LIPASE", "AMYLASE" in the last 72 hours. Cardiac Enzymes No results for input(s): "CKTOTAL", "CKMB", "CKMBINDEX", "TROPONINI" in the last 72 hours.  BNP: BNP (last 3 results) Recent Labs    11/30/22 2119  BNP 777.6*    ProBNP (last 3 results) No results for input(s): "PROBNP" in the last 8760 hours.   D-Dimer No results for input(s): "DDIMER" in the last 72 hours. Hemoglobin A1C No results for input(s): "HGBA1C" in the last 72 hours. Fasting Lipid Panel No results for input(s): "CHOL", "HDL", "LDLCALC", "TRIG", "CHOLHDL", "LDLDIRECT" in the last 72 hours. Thyroid Function Tests No results for input(s): "TSH", "T4TOTAL", "T3FREE", "THYROIDAB" in the last 72 hours.  Invalid input(s): "FREET3"  Other results:   Imaging    [place] EKG Site Rite  Result Date: 12/12/2022 If Site Rite image not attached, placement could not be confirmed due to current cardiac rhythm.    Medications:     Scheduled Medications:  aspirin  81 mg Oral Daily   atorvastatin  40 mg Oral Daily   Chlorhexidine Gluconate Cloth  6 each Topical Daily   digoxin  0.125 mg Oral Daily   docusate sodium  100 mg Oral BID   empagliflozin  10 mg Oral Daily   enoxaparin (LOVENOX) injection  40 mg Subcutaneous Q24H   multivitamin with minerals  1 tablet Oral Daily   pantoprazole  40 mg Oral Daily   Ensure Max Protein  11 oz Oral BID   sodium chloride flush  10-40 mL Intracatheter Q12H   sodium chloride flush  3 mL  Intravenous Q12H   sodium chloride flush  3 mL Intravenous Q12H   sodium chloride flush  3 mL Intravenous Q12H   sodium chloride flush  3 mL Intravenous Q12H   spironolactone  25 mg Oral Daily    Infusions:  sodium chloride     sodium chloride     sodium chloride 10 mL/hr at 12/12/22 0539    PRN Medications: sodium chloride, sodium chloride, acetaminophen, albuterol, alum & mag hydroxide-simeth, bisacodyl, guaiFENesin-dextromethorphan, hydrALAZINE, nitroGLYCERIN, ondansetron (ZOFRAN) IV, ondansetron **OR** [DISCONTINUED] ondansetron (ZOFRAN) IV, oxyCODONE, polyethylene glycol, sodium chloride flush, sodium chloride flush, sodium chloride flush, traZODone    Assessment/Plan   1. Acute on chronic systolic CHF: Echo this admission with EF < 20%, severe LV dilation, mildly decreased RV systolic function, moderate-severe functional MR, severe TR.  RHC with low output and biventricular failure.  NYHA class IV at admission, weight up > 30 lbs last few months.  Uncertain cause of cardiomyopathy, ?LBBB vs HTN vs viral myocarditis.  LBBB is not markedly wide but dyssynchrony on echo. No family history of CHF. Diuresed well w/ IV Lasix and milrinone. Diuresed > 40 lb. CVP 4 today. Now off milrinone w/ stable co-ox, 69%.  - off losartan. Did not tolerate w/ marked hypotension and AKI x 2. Will not restart - continue spironolactone 25 mg  - hold Jardiance again today w/ low CVP, resume tomorrow - no current need for loop diuretic  - continue digoxin 0.125  - no ? blocker w/ recent low output  - Has received 2 doses of Ferrlicit, so will have to wait for cardiac MRI.  - Suspect nonischemic CMP, but needs cath to r/o obstructive CAD. Plan R/LHC today  2. Elevated Hs-TnI: 895>917>720>397>382.   - Completed 48 hrs heparin gtt.  - Continue ASA/statin.  - R/LHC today  3. 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  Follow with diuresis and GDMT (repeat echo in future).  4. LBBB:  Possible LBBB cardiomyopathy based on ECHO dyssynchrony but QRS not overly wide.   5. HTN: BP not elevated. Episodes of hypotension this admit, now resolved  - GDMT per above  6. AKI: Suspect cardiorenal, baseline creatinine around 1.  Creatinine up to 2.1 and improved to 1.29 with inotrope support and diuresis. Scr went back up today to 2.1 after hypotension episode after getting losartan. Now resolved. SCr 1.12 today - avoid hypotension  - hold diuretics today w/ low CVP  - follow BMP  7. Elevated LFTs: Suspect congestive hepatopathy, improving.   Length of Stay: [id number], PA-C  12/13/2022, 9:36 AM  Advanced Heart Failure Team Pager [phone] (M-F; 7a - 5p)  Please contact [facility] for night-coverage after hours (  5p -7a ) and weekends on [email or web address]   Patient seen with PA, agree with the above note.   Cath done today:  Coronary Findings  Diagnostic Dominance: Right Left Main  Vessel was injected. Vessel is normal in caliber. Vessel is angiographically normal.    Left Anterior Descending  Vessel was injected. Vessel is normal in caliber. Vessel is angiographically normal.    Left Circumflex  Vessel was injected. Vessel is normal in caliber. Vessel is angiographically normal.    Right Coronary Artery  Vessel was injected. Vessel is normal in caliber. Vessel is angiographically normal.    Intervention   No interventions have been documented.   Right Heart  Right Heart Pressures RHC Procedural Findings: Hemodynamics (mmHg) RA mean 4 RV 41/8 PA 32/16, mean 23 PCWP mean 8 LV 112/16 AO 113/57  Oxygen saturations: PA 70% AO 98%  Cardiac Output (Fick) 6.72  Cardiac Index (Fick) 3.04  Cardiac Output (Thermo) 5.90 Cardiac Index (Thermo) 2.67 PVR 2.5 WU  PAPi 4   No complaints, no dyspnea.  BP stable.  Creatinine down to 1.12.   General: NAD, obese.  Neck: No JVD, no thyromegaly or thyroid nodule.  Lungs: Clear to auscultation bilaterally with  normal respiratory effort. CV: Nondisplaced PMI.  Heart regular S1/S2, no S3/S4, no murmur.  No peripheral edema.   Abdomen: Soft, nontender, no hepatosplenomegaly, no distention.  Skin: Intact without lesions or rashes.  Neurologic: Alert and oriented x 3.  Psych: Normal affect. Extremities: No clubbing or cyanosis.  HEENT: Normal.   Cath showed normal coronaries, nonischemic cardiomyopathy.  Filling pressures normal and cardiac output normal off milrinone.   - Continue Jardiance 10, digoxin 0.125, and spironolactone 25 daily.  - Add Lasix 40 mg po daily + KCl 20 daily tomorrow.  - Failed addition of low dose losartan x 2 with AKI and hypotension.  However, was also on milrinone at the time.  BP running higher off milrinone.  Would try again with ARB as outpatient.  - She will need eventual cMRI, not done this admission as she had IV iron.  - She will need close followup in CHF clinic.  Think she can go home today.   Loralie Champagne 12/13/2022 2:44 PM

## 2022-12-13 NOTE — Progress Notes (Signed)
Patient D.C. at this time alert and orin. With no complaints or questions. Via w.c. w/ daughter Cath sites [place]. Picc site unrem all dressing cdi

## 2022-12-13 NOTE — Progress Notes (Addendum)
Nutrition Follow-up  DOCUMENTATION CODES:   Morbid obesity  INTERVENTION:  Will discontinue order for Ensure Max Protein.  Provide Ensure Enlive po BID, each supplement provides 350 kcal and 20 grams of protein.  RD provided "Heart Failure Nutrition Therapy" handout from the Academy of [employer]etics. Reviewed patient's dietary recall. Provided examples on ways to decrease sodium intake in diet. Discouraged intake of processed foods and use of salt shaker. Encouraged fresh fruits and vegetables as well as whole grain sources of carbohydrates to maximize fiber intake. RD discussed why it is important for patient to adhere to diet recommendations, and emphasized the role of fluids, foods to avoid, and importance of weighing self daily. Teach back method used.  Continue multivitamin with minerals daily.  NUTRITION DIAGNOSIS:   Inadequate oral intake related to poor appetite as evidenced by per patient/family report.  Resolving as appetite is improving and oral intake is increasing.  GOAL:   Patient will meet greater than or equal to 90% of their needs  Progressing - addressing with intervention.  MONITOR:   PO intake, Supplement acceptance  REASON FOR ASSESSMENT:   Consult Assessment of nutrition requirement/status  ASSESSMENT:   57 y.o. female admits related to SOB and edema. PMH includes: HTN, s/p gastric bypass. Pt is currently receiving medical management for new onset CHF.  12/29: milrinone started 1/4: milrinone weaned  Noted after RD assessment pt made NPO for planned cath.  Met with patient and her daughter at bedside. Patient reports her appetite is starting to improve. She is now finishing 75-100% of meal trays. She does not like Ensure Max Protein, so she has not been drinking these supplements. With oral intake alone, pt is not quite meeting estimated kcal and protein needs. In the past 24 hours pt has had approximately 1550 kcal (74% minimum estimated  kcal needs) and 83 grams of protein (79% minimum estimated protein needs). Discussed importance of adequate intake to prevent unintentional loss of lean body mass. Patient is amenable to trying Ensure Enlive to help meet estimated needs while appetite continues to improve. Pt denies food allergies or intolerances. She reports she has occasional reflux or gas, but struggles with that at baseline. Patient reports her gastric bypass surgery was about 20 years ago. She reports she has continued on multivitamin and occasionally takes a vitamin B12 supplement. She reports her calcium supplement was stopped previously.   Pt reports she has started receiving education regarding heart failure. She reports PTA she would typically skip breakfast (though she plans to start having a well-balanced breakfast daily now). She would typically eat 2 meals per day. She may have fried chicken with sides or fast food. She may get a cheeseburger or fish sandwich from [employer]. Provided "Heart Failure Nutrition Therapy" handout and reviewed with patient and daughter.   Pt reports weight gain over the months PTA that she did not realize was related to fluid. Her weight got up to 309-314 lbs PTA. She has been diuresed this admission. On admission pt was 139.7 kg. She is currently 117.5 kg.  UOP: 500 mL UOP + 2 occurrences unmeasured UOP  I/O: -18509.3 mL since admission  Medications reviewed and include: digoxin, Colace, MVI, pantoprazole, spironolactone  Labs reviewed: Sodium 133, Chloride 95  Pt does not meet criteria for malnutrition at this time.  NUTRITION - FOCUSED PHYSICAL EXAM:  Flowsheet Row Most Recent Value  Orbital Region No depletion  Upper Arm Region No depletion  Thoracic and Lumbar Region No depletion  Buccal Region No depletion  Temple Region Mild depletion  Clavicle Bone Region No depletion  Clavicle and Acromion Bone Region No depletion  Scapular Bone Region No depletion  Dorsal Hand No  depletion  Patellar Region No depletion  Anterior Thigh Region No depletion  Posterior Calf Region No depletion  Edema (RD Assessment) Mild  Hair Reviewed  Eyes Reviewed  Mouth Reviewed  Skin Reviewed  Nails Reviewed       Diet Order:   Diet Order             Diet NPO time specified  Diet effective now                   EDUCATION NEEDS:   Education needs have been addressed  Skin:  Skin Assessment: Reviewed RN Assessment  Last BM:  12/12/22 per chart - no stool characteristics available  Height:   Ht Readings from Last 1 Encounters:  11/30/22 '5\' 5"'$  (1.651 m)   Weight:   Wt Readings from Last 1 Encounters:  12/13/22 117.5 kg   Ideal Body Weight:  56.8 kg  BMI:  Body mass index is 43.12 kg/m.  Estimated Nutritional Needs:   Kcal:  2703-5009 kcals  Protein:  105-125 gm  Fluid:  >/= 2 L  Stephanie Gates Magda Paganini, MS, RD, LDN, CNSC Pager number available on Amion

## 2022-12-13 NOTE — TOC CM/SW Note (Signed)
FMLA/disability paperwork, pt HIPAA formed signed by patient. Paperwork to HF clinic team to assist with completing. Informed pt that it could take 10-14 business days to complete. [place], Heart Failure TOC CM [phone]

## 2022-12-13 NOTE — TOC Transition Note (Signed)
Transition of Care [facility]) - CM/SW Discharge Note   Patient Details  Name: Stephanie Gates MRN: [id number] Date of Birth: [birth year]  [facility]) CM/SW Contact:  Erenest Rasher, RN Phone Number: [phone] 12/13/2022, 3:53 PM   Clinical Narrative:     No dc needs identified. Pt has family support. Dtr will provide transportation. Pt has scale at home for daily weights.   Final next level of care: Home/Self Care Barriers to Discharge: No Barriers Identified   Patient Goals and CMS Choice CMS Medicare.gov Compare Post Acute Care list provided to:: Patient    Discharge Placement     Discharge Plan and Services Additional resources added to the After Visit Summary for     Discharge Planning Services: CM Consult                                 Social Determinants of Health (SDOH) Interventions [place]: Low Risk  (12/05/2022)  Transportation Needs: No Transportation Needs (12/05/2022)  Alcohol Screen: Low Risk  (09/18/2022)  Depression (PHQ2-9): Low Risk  (09/18/2022)  Financial Resource Strain: Low Risk  (12/05/2022)  Tobacco Use: Low Risk  (12/06/2022)     Readmission Risk Interventions     No data to display

## 2022-12-13 NOTE — Progress Notes (Signed)
Educated patient on procedure. HOB less than 45*. Pt held breath upon line removal. Pressure held for 55mn, pressure dsg applied and instructed to remain in bed for 369m. Instructed to monitor and report any s/sx of bleeding and notify nurse apply pressure directly to the site to stop bleeding. Instructed to keep the drsg CDI for 24 hours. Pt VU. HeFran LowesRN VAST

## 2022-12-13 NOTE — Progress Notes (Signed)
D.C. instruction gone over with patient and daughter by [facility]. Patient has meds and D.C. instructions. Patient to be sent home with Daughter and belongings via w.c.

## 2022-12-16 ENCOUNTER — Encounter (HOSPITAL_COMMUNITY): Payer: Self-pay | Admitting: Cardiology

## 2022-12-16 ENCOUNTER — Telehealth: Payer: Self-pay | Admitting: *Deleted

## 2022-12-17 ENCOUNTER — Other Ambulatory Visit (HOSPITAL_COMMUNITY): Payer: Self-pay

## 2022-12-20 ENCOUNTER — Ambulatory Visit (HOSPITAL_COMMUNITY)
Admission: RE | Admit: 2022-12-20 | Discharge: 2022-12-20 | Disposition: A | Discharge status: Home / Self Care | Payer: 59 | Source: Ambulatory Visit | Attending: Adult Health | Admitting: Adult Health

## 2022-12-20 ENCOUNTER — Telehealth: Payer: Self-pay | Admitting: *Deleted

## 2022-12-20 ENCOUNTER — Encounter (HOSPITAL_COMMUNITY): Payer: Self-pay

## 2022-12-20 VITALS — BP 114/80 | HR 98 | Ht 65.0 in | Wt 258.6 lb

## 2022-12-20 DIAGNOSIS — Z79899 Other long term (current) drug therapy: Secondary | ICD-10-CM | POA: Insufficient documentation

## 2022-12-20 DIAGNOSIS — I428 Other cardiomyopathies: Secondary | ICD-10-CM | POA: Diagnosis not present

## 2022-12-20 DIAGNOSIS — I447 Left bundle-branch block, unspecified: Secondary | ICD-10-CM | POA: Insufficient documentation

## 2022-12-20 DIAGNOSIS — I5022 Chronic systolic (congestive) heart failure: Secondary | ICD-10-CM | POA: Insufficient documentation

## 2022-12-20 DIAGNOSIS — R0683 Snoring: Secondary | ICD-10-CM

## 2022-12-20 DIAGNOSIS — Z7984 Long term (current) use of oral hypoglycemic drugs: Secondary | ICD-10-CM | POA: Diagnosis not present

## 2022-12-20 DIAGNOSIS — G4733 Obstructive sleep apnea (adult) (pediatric): Secondary | ICD-10-CM | POA: Diagnosis not present

## 2022-12-20 DIAGNOSIS — I5021 Acute systolic (congestive) heart failure: Secondary | ICD-10-CM | POA: Diagnosis not present

## 2022-12-20 DIAGNOSIS — Z6841 Body Mass Index (BMI) 40.0 and over, adult: Secondary | ICD-10-CM | POA: Diagnosis not present

## 2022-12-20 DIAGNOSIS — I081 Rheumatic disorders of both mitral and tricuspid valves: Secondary | ICD-10-CM | POA: Diagnosis not present

## 2022-12-20 DIAGNOSIS — I071 Rheumatic tricuspid insufficiency: Secondary | ICD-10-CM

## 2022-12-20 DIAGNOSIS — Z9884 Bariatric surgery status: Secondary | ICD-10-CM | POA: Insufficient documentation

## 2022-12-20 DIAGNOSIS — I340 Nonrheumatic mitral (valve) insufficiency: Secondary | ICD-10-CM

## 2022-12-20 DIAGNOSIS — I110 Hypertensive heart disease with heart failure: Secondary | ICD-10-CM | POA: Diagnosis present

## 2022-12-20 DIAGNOSIS — I959 Hypotension, unspecified: Secondary | ICD-10-CM | POA: Diagnosis not present

## 2022-12-20 DIAGNOSIS — N179 Acute kidney failure, unspecified: Secondary | ICD-10-CM | POA: Insufficient documentation

## 2022-12-20 DIAGNOSIS — I10 Essential (primary) hypertension: Secondary | ICD-10-CM

## 2022-12-20 LAB — BASIC METABOLIC PANEL
Anion gap: 10 (ref 5–15)
BUN: 15 mg/dL (ref 6–20)
CO2: 24 mmol/L (ref 22–32)
Calcium: 9.5 mg/dL (ref 8.9–10.3)
Chloride: 102 mmol/L (ref 98–111)
Creatinine, Ser: 1.15 mg/dL — ABNORMAL HIGH (ref 0.44–1.00)
GFR, Estimated: 56 mL/min — ABNORMAL LOW (ref >60)
Glucose, Bld: 98 mg/dL (ref 70–99)
Potassium: 3.9 mmol/L (ref 3.5–5.1)
Sodium: 136 mmol/L (ref 135–145)

## 2022-12-20 LAB — BRAIN NATRIURETIC PEPTIDE: B Natriuretic Peptide: 497.5 pg/mL — ABNORMAL HIGH (ref 0.0–100.0)

## 2022-12-20 NOTE — Op Note (Signed)
Patient Name: Stephanie Gates        DOB: Jan 09, 1966      Height: [place]  Office Name: [facility]         Referring Provider:Dr. Glori Bickers  Today's Date:12/20/2022  Date:  12/20/2022 STOP BANG RISK ASSESSMENT S (snore) Have you been told that you snore?     YES   T (tired) Are you often tired, fatigued, or sleepy during the day?   YES  O (obstruction) Do you stop breathing, choke, or gasp during sleep? NO   P (pressure) Do you have or are you being treated for high blood pressure? YES   B (BMI) Is your body index greater than 35 kg/m? YES   A (age) Are you 81 years old or older? YES   N (neck) Do you have a neck circumference greater than 16 inches?   YES/NO   G (gender) Are you a female? NO   TOTAL STOP/BANG "YES" ANSWERS 5                                                                       For Office Use Only              Procedure Order Form    YES to 3+ Stop Bang questions OR two clinical symptoms - patient qualifies for WatchPAT (CPT 95800)             Clinical Notes: Will consult Sleep Specialist and refer for management of therapy due to patient increased risk of Sleep Apnea. Ordering a sleep study due to the following two clinical symptoms: Excessive daytime sleepiness G47.10 / Gastroesophageal reflux K21.9 / Nocturia R35.1 / Morning Headaches G44.221 / Difficulty concentrating R41.840 / Memory problems or poor judgment G31.84 / Personality changes or irritability R45.4 / Loud snoring R06.83 / Depression F32.9 / Unrefreshed by sleep G47.8 / Impotence N52.9 / History of high blood pressure R03.0 / Insomnia G47.00    I understand that I am proceeding with a home sleep apnea test as ordered by my treating physician. I understand that untreated sleep apnea is a serious cardiovascular risk factor and it is my responsibility to perform the test and seek management for sleep apnea. I will be contacted with the results and be managed  for sleep apnea by a local sleep physician. I will be receiving equipment and further instructions from [facility] I shall promptly ship back the equipment via the included mailing label. I understand my insurance will be billed for the test and as the patient I am responsible for any insurance related out-of-pocket costs incurred. I have been provided with written instructions and can call for additional video or telephonic instruction, with 24-hour availability of qualified personnel to answer any questions: Patient Help Desk [phone].  Patient Signature ______________________________________________________   Date______________________ Patient Telemedicine Verbal Consent

## 2022-12-20 NOTE — Progress Notes (Signed)
PCP: She is establishing at [facility]  Primary HF Cardiologist: Dr Haroldine Laws   HPI: Ms Yarberry is a 57 y/o woman with HTN, morbid obesity s/p bariatric surgery, LBBB (130-140 ms), IDA, LBBB, OSA, and chronic HFrEF.  She had sleep study about 1 year ago and was told she had sleep apnea but she did not follow up with CPAP.   Admitted 11/30/22 with 3 months HF symptoms. Echo EF 20% with dyssynchrony, RV moderately down. Moderate to severe MR. Had CT chest. No PE. Subsequently developed AKI and inability to diurese. Advanced HF team consulted. Underwent RHC which showed markedly elevated filling pressures, low output with RV > LV failure, and normal coronaries. Placed on milrinone and diuresed with IV lasix.  Gradually weaned off milrinone and transitioned to lasix 40 mg daily. GDMT limited by hypotension, AKI, and low output. Discharged on 12/13/22. .     Today she returns for HF follow up with her daughter. .Overall feeling fine. Gets a little tired after walking. Having some shortness of breath walking in the store but not to bad. Denies PND/Orthopnea. Appetite ok. Eating low sodium diet. No fever or chills. Weight at home  254-256 pounds. Taking all medications.Works [employer] as trained.   2D Echo 12/02/22 LV < 20% global HK, grade IIDD. RV mildly reduced RA/LA mildly dilated. . Functional mitral regurgitation due to left ventricular disfunction and annular dilatation. The mitral valve is degenerative. Moderate to severe mitral valve regurgitation. No evidence of mitral stenosis. Tricuspid valve regurgitation is severe. T  RHC 12/06/22 RA = 22 RV = 9/22 PA = 49/26 (33) PCW = 23  Fick cardiac output/index = 6.1/2.6 TD CO/CI = 4.1/1.7 PVR = 1.6 (Fick) 2.5 (TD) Ao sat = 99% PA sat = 64%, 66% SVC sat = 65% PaPI = 1.0    R/LHC 12/13/22   Right Heart Pressures RHC Procedural Findings: Hemodynamics (mmHg) RA mean 4 RV 41/8 PA 32/16, mean 23 PCWP mean 8 LV 112/16 AO 113/57 Oxygen  saturations: PA 70% AO 98% Cardiac Output (Fick) 6.72  Cardiac Index (Fick) 3.04 Cardiac Output (Thermo) 5.90 Cardiac Index (Thermo) 2.67 PVR 2.5 WU PAPi 4        ROS: All systems negative except as listed in HPI, PMH and Problem List.  SH:  Social History   Socioeconomic History   Marital status: Single    Spouse name: Not on file   Number of children: 1   Years of education: Not on file   Highest education level: Bachelor's degree (e.g., BA, AB, BS)  Occupational History   Occupation: Manufacturing engineer  Tobacco Use   Smoking status: Never   Smokeless tobacco: Never   Tobacco comments:    Never smoke 12/20/22  Vaping Use   Vaping Use: Never used  Substance and Sexual Activity   Alcohol use: No   Drug use: No   Sexual activity: Not Currently  Other Topics Concern   Not on file  Social History Narrative   Not on file   Social Determinants of Health   Financial Resource Strain: Low Risk  (12/05/2022)   Overall Financial Resource Strain (CARDIA)    Difficulty of Paying Living Expenses: Not very hard  Food Insecurity: No Food Insecurity (12/16/2022)   Hunger Vital Sign    Worried About Running Out of Food in the Last Year: Never true    Ran Out of Food in the Last Year: Never true  Transportation Needs: No Transportation Needs (12/05/2022)   PRAPARE -  Hydrologist (Medical): No    Lack of Transportation (Non-Medical): No  Physical Activity: Not on file  Stress: Not on file  Social Connections: Not on file  Intimate Partner Violence: Not on file    FH:  Family History  Problem Relation Age of Onset   Heart failure Mother    Diabetes Mother    Hypertension Mother    Cancer Father    Diabetes Father    Hypertension Father    Heart failure Brother 70    Past Medical History:  Diagnosis Date   Allergy    seasonal   Anemia    Arthritis    right hip   Gastritis    Headache    sinus/allergies   Hypertension     Seasonal allergies    Vertigo    1-2x/month   Wears contact lenses     Current Outpatient Medications  Medication Sig Dispense Refill   digoxin (LANOXIN) 0.125 MG tablet Take 1 tablet (0.125 mg total) by mouth daily. 30 tablet 5   empagliflozin (JARDIANCE) 10 MG TABS tablet Take 1 tablet (10 mg total) by mouth daily. 30 tablet 5   furosemide (LASIX) 40 MG tablet Take 1 tablet (40 mg total) by mouth daily. 30 tablet 5   Multiple Vitamin (MULTIVITAMIN) capsule Take 1 capsule by mouth daily.     pantoprazole (PROTONIX) 40 MG tablet TAKE 1 TABLET(40 MG) BY MOUTH DAILY 90 tablet 3   potassium chloride SA (KLOR-CON M) 20 MEQ tablet Take 1 tablet (20 mEq total) by mouth daily. 30 tablet 5   spironolactone (ALDACTONE) 25 MG tablet Take 1 tablet (25 mg total) by mouth daily. 30 tablet 5   No current facility-administered medications for this encounter.    Vitals:   12/20/22 1213  BP: 114/80  Pulse: 98  SpO2: 97%  Weight: 117.3 kg (258 lb 9.6 oz)  Height: '5\' 5"'$  (1.651 m)   Wt Readings from Last 3 Encounters:  12/20/22 117.3 kg (258 lb 9.6 oz)  12/13/22 117.5 kg (259 lb 1.6 oz)  11/15/22 (!) 139.1 kg (306 lb 9.6 oz)    PHYSICAL EXAM: General:  Walked in the clinic. No resp difficulty HEENT: normal Neck: supple. JVP flat. Carotids 2+ bilaterally; no bruits. No lymphadenopathy or thryomegaly appreciated. Cor: PMI normal. Regular rate & rhythm. No rubs, gallops or murmurs. Lungs: clear Abdomen: obese,soft, nontender, nondistended. No hepatosplenomegaly. No bruits or masses. Good bowel sounds. Extremities: no cyanosis, clubbing, rash, edema Neuro: alert & orientedx3, cranial nerves grossly intact. Moves all 4 extremities w/o difficulty. Affect pleasant.   ECG: SR occasional PVCs QRS 134 ms    ASSESSMENT & PLAN: 1. Chronic HFrEF, NICM  11/2022 Echo EF < 20%, severe LV dilation, mildly decreased RV systolic function, moderate-severe functional MR, severe TR.  - Cath with elevated  filling pressures, thermo 1.7 Fick 2.6. Had repeat cath thermo CI 2.7 Uncertain cause of cardiomyopathy, ?LBBB vs HTN vs viral myocarditis.  LBBB is not markedly wide but dyssynchrony on echo. No family history of CHF.  Marland KitchenNYHA III. Volume status stable. Continue lasix 40 mg daily  - Continue digoxin 0.125 mg daily  - Continue spironolactone 25 mg daily  - Continue jardiance 10 mg daily  - For now hold off on ARB/ARNI. During  recent hospitalization she developed hypotension and AKI x2. Will not re challenge today.  - Check BMET and BNP  - As noted below needs sleep study and suspect she will  need to start CPAP - Plan to check ECHO in 3 months after GDMT optimized.  -  no ? blocker w/ recent low output   2.  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  Follow with diuresis and GDMT (repeat echo in  3 months).   3. 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   - QRS on EKG 132 ms  4. HTN:  - Stable today.    6. H/O AKI: Suspect cardiorenal, baseline creatinine around 1.  Creatinine up to 2.1 and improved to 1.3 with inotrope support and diuresis. - Check BMET today   7. OSA - Had sleep study over a year ago and was diagnosed. This will be important to address.  - She has daytime fatigue, snores, and un refreshed sleep.  -Set up Sleep study.  8. Obesity. -H/O Bariatric Surgery -Body mass index is 43.03 kg/m. - Discussed  low salt food choices.   Follow up in 3 weeks with pharmacy, 6 weeks with APP, and 12 weeks with Dr Vaughan Browner and an Echo.   Aydn Ferrara NP-C  1:18 PM

## 2022-12-20 NOTE — Patient Instructions (Addendum)
EKG done today.  Labs done today. We will contact you only if your labs are abnormal.  No medication changes were made. Please continue all current medications as prescribed.  Your physician recommends that you schedule a follow-up appointment in: 3 weeks with our Clinic Pharmacist and in 12 weeks with Dr. Haroldine Laws with an echo prior to your exam.   Your physician has requested that you have an echocardiogram. Echocardiography is a painless test that uses sound waves to create images of your heart. It provides your doctor with information about the size and shape of your heart and how well your heart's chambers and valves are working. This procedure takes approximately one hour. There are no restrictions for this procedure. Please do NOT wear cologne, perfume, aftershave, or lotions (deodorant is allowed). Please arrive 15 minutes prior to your appointment time.  Your provider has recommended that you have a home sleep study ([facility] Test).  We have provided you with the equipment in our office today. Please go ahead and download the app. DO NOT OPEN OR TAMPER WITH THE BOX UNTIL WE ADVISE YOU TO DO SO. Once insurance has approved the test our office will call you with PIN number and approval to proceed with testing. Once you have completed the test you just dispose of the equipment, the information is automatically uploaded to [place] via blue-tooth technology. If your test is positive for sleep apnea and you need a home CPAP machine you will be contacted by Dr Theodosia Blender office [facility]) to set this up.  If you have any questions or concerns before your next appointment please send [place] a message through [facility] or call our office at [phone].    TO LEAVE A MESSAGE FOR THE NURSE SELECT OPTION 2, PLEASE LEAVE A MESSAGE INCLUDING: YOUR NAME DATE OF BIRTH CALL BACK NUMBER REASON FOR CALL**this is important as we prioritize the call backs  YOU WILL RECEIVE A CALL BACK THE SAME DAY AS LONG AS YOU  CALL BEFORE 4:00 PM   Do the following things EVERYDAY: Weigh yourself in the morning before breakfast. Write it down and keep it in a log. Take your medicines as prescribed Eat low salt foods--Limit salt (sodium) to 2000 mg per day.  Stay as active as you can everyday Limit all fluids for the day to less than 2 liters   At the [facility], you and your health needs are our priority. As part of our continuing mission to provide you with exceptional heart care, we have created designated Provider Care Teams. These Care Teams include your primary Cardiologist (physician) and Advanced Practice Providers (APPs- Physician Assistants and Nurse Practitioners) who all work together to provide you with the care you need, when you need it.   You may see any of the following providers on your designated Care Team at your next follow up: Dr Glori Bickers Dr Haynes Kerns, NP Lyda Jester, [place] Audry Riles, PharmD   Please be sure to bring in all your medications bottles to every appointment.

## 2022-12-20 NOTE — Progress Notes (Signed)
ITAMAR home sleep study given to patient, all instructions explained, waiver signed, and CLOUDPAT registration complete.

## 2023-01-03 ENCOUNTER — Telehealth (HOSPITAL_COMMUNITY): Payer: Self-pay

## 2023-01-03 NOTE — Telephone Encounter (Signed)
I called patient back to inform her that pre-cert is not required for the sleep study.

## 2023-01-03 NOTE — Telephone Encounter (Signed)
She called to see if her sleep study was pre-certified. Did you ever see anything for this? I did not see any notes.

## 2023-01-03 NOTE — Telephone Encounter (Signed)
FMLA forms faxed and conformation received, patient informed. Leaving copy of papers at front desk for patient

## 2023-01-12 ENCOUNTER — Encounter (INDEPENDENT_AMBULATORY_CARE_PROVIDER_SITE_OTHER): Payer: 59 | Admitting: Cardiology

## 2023-01-12 DIAGNOSIS — G4733 Obstructive sleep apnea (adult) (pediatric): Secondary | ICD-10-CM

## 2023-01-13 ENCOUNTER — Ambulatory Visit: Payer: 59 | Attending: Nurse Practitioner

## 2023-01-13 ENCOUNTER — Other Ambulatory Visit (HOSPITAL_COMMUNITY): Payer: Self-pay | Admitting: Cardiology

## 2023-01-13 DIAGNOSIS — G4733 Obstructive sleep apnea (adult) (pediatric): Secondary | ICD-10-CM

## 2023-01-13 MED ORDER — EMPAGLIFLOZIN 10 MG PO TABS: 10.0000 mg | ORAL_TABLET | Freq: Every day | ORAL | 5 refills | Status: DC

## 2023-01-13 MED ORDER — FUROSEMIDE 40 MG PO TABS: 40.0000 mg | ORAL_TABLET | Freq: Every day | ORAL | 5 refills | Status: DC

## 2023-01-13 MED ORDER — DIGOXIN 125 MCG PO TABS: 0.1250 mg | ORAL_TABLET | Freq: Every day | ORAL | 5 refills | Status: DC

## 2023-01-13 MED ORDER — SPIRONOLACTONE 25 MG PO TABS: 25.0000 mg | ORAL_TABLET | Freq: Every day | ORAL | 5 refills | Status: DC

## 2023-01-13 MED ORDER — POTASSIUM CHLORIDE CRYS ER 20 MEQ PO TBCR: 20.0000 meq | EXTENDED_RELEASE_TABLET | Freq: Every day | ORAL | 5 refills | Status: DC

## 2023-01-13 NOTE — Procedures (Signed)
SLEEP STUDY REPORT Patient Information Study Date: 01/12/2023 Patient Name: Stephanie Gates Patient ID: [id number] Birth Date: 02/04/1966 Age: 57 Gender: Female BMI: 43.3 (W=260 lb, H=5' 5'') Stopbang: 5 Referring Physician: Darrick Grinder, NP  TEST DESCRIPTION: Home sleep apnea testing was completed using the WatchPat, a Type 1 device, utilizing peripheral arterial tonometry (PAT), chest movement, actigraphy, pulse oximetry, pulse rate, body position and snore. AHI was calculated with apnea and hypopnea using valid sleep time as the denominator. RDI includes apneas, hypopneas, and RERAs. The data acquired and the scoring of sleep and all associated events were performed in accordance with the recommended standards and specifications as outlined in the AASM Manual for the Scoring of Sleep and Associated Events 2.2.0 (2015).   FINDINGS:   1. Mild Obstructive Sleep Apnea with AHI 10.4/hr.   2. No Central Sleep Apnea with pAHIc 0.7/hr.   3. Oxygen desaturations as low as 81%.   4. Mild snoring was present. O2 sats were < 88% for 8.2 min.   5. Total sleep time was 5 hrs and 23 min.   6. 18.4% of total sleep time was spent in REM sleep.   7. Shortened sleep onset latency at 9 min.   8. Prolonged REM sleep onset latency at 156 min.   9. Total awakenings were 17.  10. Arrhythmia detection:  None  DIAGNOSIS: Mild Obstructive Sleep Apnea (G47.33) Nocturnal Hypoxemia  RECOMMENDATIONS:   1.  Clinical correlation of these findings is necessary.  The decision to treat obstructive sleep apnea (OSA) is usually based on the presence of apnea symptoms or the presence of associated medical conditions such as Hypertension, Congestive Heart Failure, Atrial Fibrillation or Obesity.  The most common symptoms of OSA are snoring, gasping for breath while sleeping, daytime sleepiness and fatigue.   2.  Initiating apnea therapy is recommended given the presence of symptoms and/or associated conditions.  Recommend proceeding with one of the following:     a.  Auto-CPAP therapy with a pressure range of 5-20cm H2O.     b.  An oral appliance (OA) that can be obtained from certain dentists with expertise in sleep medicine.  These are primarily of use in non-obese patients with mild and moderate disease.     c.  An ENT consultation which may be useful to look for specific causes of obstruction and possible treatment options.     d.  If patient is intolerant to PAP therapy, consider referral to ENT for evaluation for hypoglossal nerve stimulator.   3.  Close follow-up is necessary to ensure success with CPAP or oral appliance therapy for maximum benefit.  4.  A follow-up oximetry study on CPAP is recommended to assess the adequacy of therapy and determine the need for supplemental oxygen or the potential need for Bi-level therapy.  An arterial blood gas to determine the adequacy of baseline ventilation and oxygenation should also be considered.  5.  Healthy sleep recommendations include:  adequate nightly sleep (normal 7-9 hrs/night), avoidance of caffeine after noon and alcohol near bedtime, and maintaining a sleep environment that is cool, dark and quiet.  6.  Weight loss for overweight patients is recommended.  Even modest amounts of weight loss can significantly improve the severity of sleep apnea.  7.  Snoring recommendations include:  weight loss where appropriate, side sleeping, and avoidance of alcohol before bed.  8.  Operation of motor vehicle should be avoided when sleepy.  Signature: Fransico Him, MD; [facility]; Diplomat, [place] Board of Sleep  Medicine Electronically Signed: 01/13/2023

## 2023-01-15 ENCOUNTER — Telehealth: Payer: Self-pay | Admitting: *Deleted

## 2023-01-15 ENCOUNTER — Encounter: Payer: 59 | Admitting: Nurse Practitioner

## 2023-01-15 DIAGNOSIS — G4733 Obstructive sleep apnea (adult) (pediatric): Secondary | ICD-10-CM

## 2023-01-15 DIAGNOSIS — I10 Essential (primary) hypertension: Secondary | ICD-10-CM

## 2023-01-15 NOTE — Telephone Encounter (Signed)
The patient has been notified of the result and verbalized understanding.  All questions (if any) were answered. Stephanie Gates, [place] 01/15/2023 4:00 PM    Upon patient request DME selection is [facility]. Patient understands he will be contacted by [place] to set up his cpap. Patient understands to call if [place] does not contact him with new setup in a timely manner. Patient understands they will be called once confirmation has been received from [facility]/ that they have received their new machine to schedule 10 week follow up appointment.   [place] notified of new cpap order  Please add to airview Patient was grateful for the call and thanked me.

## 2023-01-15 NOTE — Telephone Encounter (Signed)
-----   Message from Lauralee Evener, [place] sent at 01/14/2023 12:25 PM EST -----  ----- Message ----- From: Sueanne Margarita, MD Sent: 01/13/2023   1:05 PM EST To: [facility] Sleep Studies  Please let patient know that they have sleep apnea and recommend treating with CPAP.  Please order an auto CPAP from 4-15cm H2O with heated humidity and mask of choice.  Order overnight pulse ox on CPAP.  Followup with me in 6 weeks.

## 2023-01-15 NOTE — Progress Notes (Incomplete)
***In Progress***    Advanced Heart Failure Clinic Note   HPI:   Stephanie Gates is a 57 y/o woman with HTN, morbid obesity s/p bariatric surgery, LBBB (130-140 Stephanie), IDA, LBBB, OSA, and chronic HFrEF.  She had sleep study about 1 year ago and was told she had sleep apnea but she did not follow up with CPAP. Sleep study repeated 01/12/23 showing mild obstructive sleep apnea.   Admitted 11/30/22 with 3 months HF symptoms. Echo EF 20% with dyssynchrony, RV moderately down. Moderate to severe MR. Had CT chest. No PE. Subsequently developed AKI and inability to diurese. Advanced HF team consulted. Underwent RHC which showed markedly elevated filling pressures, low output with RV > LV failure, and normal coronaries. Placed on milrinone and diuresed with IV lasix.  Gradually weaned off milrinone and transitioned to lasix 40 mg daily. GDMT limited by hypotension, AKI, and low output. Discharged on 12/13/22. .     At last visit on 12/20/22, she reported overall feeling fine. She felt a little tired walking longer distances. Had some shortness of breath walking in the store. Denied PND/Orthopnea. Weight at home was 254-256 pounds.  Today she returns to HF clinic for pharmacist medication titration. At last visit with MD ***.   Overall feeling ***. Dizziness, lightheadedness, fatigue:  Chest pain or palpitations:  How is your breathing?: *** SOB: Able to complete all ADLs. Activity level ***  Weight at home pounds. Takes furosemide/torsemide/bumex *** mg *** daily.  LEE PND/Orthopnea  Appetite *** Low-salt diet:   Physical Exam Cost/affordability of meds   HF Medications: Spironolactone 25 mg daily Jardiance 10 mg daily Digoxin 0.125 mg daily Furosemide 40 mg daily  Has the patient been experiencing any side effects to the medications prescribed?  {YES NO:22349}  Does the patient have any problems obtaining medications due to transportation or finances?   {YES NO:22349}  Understanding of  regimen: {excellent/good/fair/poor:19665} Understanding of indications: {excellent/good/fair/poor:19665} Potential of compliance: {excellent/good/fair/poor:19665} Patient understands to avoid NSAIDs. Patient understands to avoid decongestants.    Pertinent Lab Values (12/20/22): Serum creatinine 1.15, BUN 15, Potassium 3.9, Sodium 136, BNP 497.5, Magnesium ***, Digoxin ***   Vital Signs: Weight: *** (last clinic weight: ***) Blood pressure: ***  Heart rate: ***   Assessment/Plan: 1. Chronic HFrEF, NICM  11/2022 Echo EF < 20%, severe LV dilation, mildly decreased RV systolic function, moderate-severe functional MR, severe TR.  - Cath with elevated filling pressures, thermo 1.7 Fick 2.6. Had repeat cath thermo CI 2.7 Uncertain cause of cardiomyopathy, ?LBBB vs HTN vs viral myocarditis.  LBBB is not markedly wide but dyssynchrony on echo. No family history of CHF.  Marland KitchenNYHA III. Volume status stable. Continue lasix 40 mg daily  - Continue spironolactone 25 mg daily  - Continue Jardiance 10 mg daily  - Continue digoxin 0.125 mg daily  - For now hold off on ARB/ARNI. During recent hospitalization she developed hypotension and AKI x2. Will not re challenge today.  - Check BMET and BNP  - Plan to check ECHO after GDMT optimized.  - No ? blocker w/ recent low output    2.  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 Follow with diuresis and GDMT (repeat echo in  3 months).    3. 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     4. HTN:  - Stable today.     6. H/O AKI: Suspect cardiorenal, baseline creatinine around 1.  Creatinine up to 2.1 and  improved to 1.3 with inotrope support and diuresis. - Check BMET today    7. OSA - Had sleep study over a year ago and was diagnosed. This will be important to address.  - She has daytime fatigue, snores, and un refreshed sleep.  - Sleep study showed OSA and CPAP ordered through [facility].   8. Obesity. - H/O Bariatric Surgery - Body mass index is 43.03 kg/m. - Discussed  low salt food choices.  Follow up ***   Audry Riles, PharmD, BCPS, BCCP, CPP Heart Failure Clinic Pharmacist [phone]

## 2023-01-16 ENCOUNTER — Other Ambulatory Visit (HOSPITAL_COMMUNITY): Payer: Self-pay

## 2023-01-16 ENCOUNTER — Ambulatory Visit (HOSPITAL_COMMUNITY)
Admission: RE | Admit: 2023-01-16 | Discharge: 2023-01-16 | Disposition: A | Discharge status: Home / Self Care | Payer: 59 | Source: Ambulatory Visit | Attending: Cardiology | Admitting: Cardiology

## 2023-01-16 VITALS — BP 136/84 | HR 84 | Wt 253.6 lb

## 2023-01-16 DIAGNOSIS — E669 Obesity, unspecified: Secondary | ICD-10-CM | POA: Insufficient documentation

## 2023-01-16 DIAGNOSIS — G4733 Obstructive sleep apnea (adult) (pediatric): Secondary | ICD-10-CM | POA: Diagnosis not present

## 2023-01-16 DIAGNOSIS — I5021 Acute systolic (congestive) heart failure: Secondary | ICD-10-CM

## 2023-01-16 DIAGNOSIS — I428 Other cardiomyopathies: Secondary | ICD-10-CM | POA: Diagnosis not present

## 2023-01-16 DIAGNOSIS — I5022 Chronic systolic (congestive) heart failure: Secondary | ICD-10-CM | POA: Diagnosis present

## 2023-01-16 DIAGNOSIS — Z6841 Body Mass Index (BMI) 40.0 and over, adult: Secondary | ICD-10-CM | POA: Insufficient documentation

## 2023-01-16 DIAGNOSIS — Z9884 Bariatric surgery status: Secondary | ICD-10-CM | POA: Insufficient documentation

## 2023-01-16 DIAGNOSIS — I110 Hypertensive heart disease with heart failure: Secondary | ICD-10-CM | POA: Insufficient documentation

## 2023-01-16 DIAGNOSIS — I447 Left bundle-branch block, unspecified: Secondary | ICD-10-CM | POA: Insufficient documentation

## 2023-01-16 LAB — BASIC METABOLIC PANEL
Anion gap: 11 (ref 5–15)
BUN: 8 mg/dL (ref 6–20)
CO2: 27 mmol/L (ref 22–32)
Calcium: 9.8 mg/dL (ref 8.9–10.3)
Chloride: 99 mmol/L (ref 98–111)
Creatinine, Ser: 1.16 mg/dL — ABNORMAL HIGH (ref 0.44–1.00)
GFR, Estimated: 55 mL/min — ABNORMAL LOW (ref >60)
Glucose, Bld: 86 mg/dL (ref 70–99)
Potassium: 4.5 mmol/L (ref 3.5–5.1)
Sodium: 137 mmol/L (ref 135–145)

## 2023-01-16 LAB — BRAIN NATRIURETIC PEPTIDE: B Natriuretic Peptide: 237.1 pg/mL — ABNORMAL HIGH (ref 0.0–100.0)

## 2023-01-16 MED ORDER — LOSARTAN POTASSIUM 25 MG PO TABS
12.5000 mg | ORAL_TABLET | Freq: Every day | ORAL | 11 refills | Status: DC
  Filled 2023-01-16: qty 15, 30d supply, fill #0

## 2023-01-16 NOTE — Progress Notes (Signed)
Advanced Heart Failure Clinic Note   PCP: She is establishing at [facility]  Primary HF Cardiologist: Dr Haroldine Laws   HPI:   Ms Roesler is a 57 y/o woman with HTN, morbid obesity s/p bariatric surgery, LBBB (130-140 ms), IDA, LBBB, OSA, and chronic HFrEF.  She had sleep study about 1 year ago and was told she had sleep apnea but she did not follow up with CPAP. Sleep study repeated 01/12/23 showing mild obstructive sleep apnea.   Admitted 11/30/22 with 3 months of HF symptoms. Echo EF 20% with dyssynchrony, RV moderately down. Moderate to severe MR. Had CT chest. No PE. Subsequently developed AKI and inability to diurese. Advanced HF team consulted. Underwent RHC which showed markedly elevated filling pressures, low output with RV > LV failure, and normal coronaries. Placed on milrinone and diuresed with IV Lasix.  Gradually weaned off milrinone and transitioned to Lasix 40 mg daily. GDMT limited by hypotension, AKI, and low output. Discharged on 12/13/22 with RHC showing RA 4, PCWP 8, Fick CO/CI 6.72/3.04.    At last visit on 12/20/22, she reported overall feeling fine. She felt a little tired walking longer distances. Had some shortness of breath walking in the store. Denied PND/Orthopnea. Weight at home was 254-256 pounds.  Today she returns to HF clinic for pharmacist medication titration. She reports overall feeling pretty good and has continued to improve from last visit. She denies dizziness and lightheadedness. She has continued to have some fatigue, but it has overall improved after hospital discharge. She denies palpitations. Reports chest pain only with coughing. Although breathing has improved, she remains SOB with more intensive ADLs. She had to stop to take one break when walking from the [facility] parking garage to clinic. 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 Weight at home stable ~255 pounds. Takes Lasix 40 mg daily and has not needed any  extra. No LEE. No PND/Orthopnea. Appetite is pretty good. Does well with low salt diet. Is going on a cruise in April. She has been connected with a medical supply store and is awaiting insurance approval for CPAP.  HF Medications: Spironolactone 25 mg daily Jardiance 10 mg daily Digoxin 0.125 mg daily Lasix 40 mg daily  Has the patient been experiencing any side effects to the medications prescribed?  Yes. She feels like she has a mild yeast infection. She was informed that [name] may be contributing. She does not want fluconazole at this time. Counseled on hygiene, OTC options, and asked to reach out again if recurs.   Does the patient have any problems obtaining medications due to transportation or finances?   No  Understanding of regimen: excellent Understanding of indications: excellent Potential of compliance: excellent Patient understands to avoid NSAIDs. Patient understands to avoid decongestants.    Pertinent Lab Values (12/20/22): Serum creatinine 1.15, BUN 15, Potassium 3.9, Sodium 136, BNP 497.5 BMET, BNP, Dig level pending today  Vital Signs: Weight: 253.6 lbs (last clinic weight: 258 lbs) Blood pressure: 136/82 mmHg (no meds yet this AM) Heart rate: 84   Assessment/Plan: 1. Chronic HFrEF, NICM  11/2022 Echo EF < 20%, severe LV dilation, mildly decreased RV systolic function, moderate-severe functional MR, severe TR.  - Cath with elevated filling pressures, thermo 1.7 Fick 2.6. Had repeat cath thermo CI 2.7 Uncertain cause of cardiomyopathy, ?LBBB vs HTN vs viral myocarditis. LBBB is not markedly wide but dyssynchrony on echo. No family history of CHF.  Marland KitchenNYHA  III. Volume status improved. Continue Lasix 40 mg daily for now. BNP and BMET pending. - Start losartan 12.5 mg daily. Patient did not tolerate losartan x 2 inpatient, however vasodilation from concomitant milrinone may have contributed. Clinical status is more stable now and blood pressure is up today. Will  monitor labs closely with the first BMET in one week. If she does not tolerate ARB, then she may be a candidate for BiDil. - Continue spironolactone 25 mg daily  - Continue Jardiance 10 mg daily. Mild yeast infection per patient report. She would like to continue Jardiance without treatment at this time. Recommended OTC.  - Continue digoxin 0.125 mg daily. No digoxin lab charted. Has not taken dose yet today. Will check digoxin level.  - Will hold off on beta blocker given recent low output and persistent albeit improved shortness of breath with ADLs. Can consider starting in the future. - Check BMET, BNP, digoxin level  - Plan to check ECHO after GDMT optimized.    2.  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 Follow with diuresis and GDMT (repeat echo in 3 months).    3. 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     4. HTN:  - Stable today.     6. H/O AKI: Suspect cardiorenal, baseline creatinine around 1. Creatinine up to 2.1 inpatient and improved to 1.15 in 12/2022.  - Check BMET today    7. OSA - Had sleep study over a year ago and was diagnosed. This will be important to address.  - She has daytime fatigue, snores, and un refreshed sleep.  - Sleep study showed OSA and CPAP ordered through [facility].   8. Obesity. - H/O Bariatric Surgery - Body mass index is 43.03 kg/m. - Discussed low salt food choices and seasonings.  Follow up in 1 week for BMET. Follow up in 3-4 weeks with pharmacy clinic.  Audry Riles, PharmD, BCPS, BCCP, CPP Heart Failure Clinic Pharmacist [phone]

## 2023-01-16 NOTE — Patient Instructions (Signed)
It was a pleasure seeing you today!  MEDICATIONS: -We are changing your medications today -Start losartan 12.5 mg daily -Check blood pressure daily -Call if you have questions about your medications.  LABS: -We will call you if your labs need attention.  NEXT APPOINTMENT: Return to clinic in 1 week for labs.  In general, to take care of your heart failure: -Limit your fluid intake to 2 Liters (half-gallon) per day.   -Limit your salt intake to ideally 2-3 grams (2000-3000 mg) per day. -Weigh yourself daily and record, and bring that "weight diary" to your next appointment.  (Weight gain of 2-3 pounds in 1 day typically means fluid weight.) -The medications for your heart are to help your heart and help you live longer.   -Please contact us before stopping any of your heart medications.  Call the clinic at [phone] with questions or to reschedule future appointments.

## 2023-01-22 ENCOUNTER — Other Ambulatory Visit: Payer: Self-pay

## 2023-01-22 DIAGNOSIS — G4733 Obstructive sleep apnea (adult) (pediatric): Secondary | ICD-10-CM

## 2023-01-23 ENCOUNTER — Ambulatory Visit (HOSPITAL_COMMUNITY)
Admission: RE | Admit: 2023-01-23 | Discharge: 2023-01-23 | Disposition: A | Discharge status: Home / Self Care | Payer: 59 | Source: Ambulatory Visit | Attending: Internal Medicine | Admitting: Internal Medicine

## 2023-01-23 DIAGNOSIS — I5021 Acute systolic (congestive) heart failure: Secondary | ICD-10-CM | POA: Diagnosis present

## 2023-01-23 LAB — BASIC METABOLIC PANEL
Anion gap: 7 (ref 5–15)
BUN: 8 mg/dL (ref 6–20)
CO2: 28 mmol/L (ref 22–32)
Calcium: 9.4 mg/dL (ref 8.9–10.3)
Chloride: 99 mmol/L (ref 98–111)
Creatinine, Ser: 1.04 mg/dL — ABNORMAL HIGH (ref 0.44–1.00)
GFR, Estimated: >60 mL/min (ref >60)
Glucose, Bld: 92 mg/dL (ref 70–99)
Potassium: 4.2 mmol/L (ref 3.5–5.1)
Sodium: 134 mmol/L — ABNORMAL LOW (ref 135–145)

## 2023-01-28 ENCOUNTER — Encounter: Payer: 59 | Admitting: Nurse Practitioner

## 2023-01-29 ENCOUNTER — Telehealth (HOSPITAL_COMMUNITY): Payer: Self-pay | Admitting: Cardiology

## 2023-01-29 NOTE — Telephone Encounter (Signed)
Pt called left Vm on triage line with a request for dental cleaning approval  Reports she may also need a cavity filled.  Chart reviewed No SX hx and pt is not on blood thinner  Ok to proceed with routine dental work

## 2023-02-04 ENCOUNTER — Telehealth (HOSPITAL_COMMUNITY): Payer: Self-pay

## 2023-02-04 NOTE — Telephone Encounter (Signed)
Patient called and stated she is having swelling in her ankles. She denies any other symptoms. Please advise any changes for her.

## 2023-02-04 NOTE — Telephone Encounter (Signed)
I called patient and updated her on medication changes.

## 2023-02-07 ENCOUNTER — Encounter: Payer: 59 | Admitting: Nurse Practitioner

## 2023-02-09 NOTE — Progress Notes (Incomplete)
***In Progress***    Advanced Heart Failure Clinic Note   PCP: She is establishing at [facility]  Primary HF Cardiologist: Dr Haroldine Laws  HPI:    [name] is a 57 y/o woman with HTN, morbid obesity s/p bariatric surgery, LBBB (130-140 ms), IDA, LBBB, OSA, and chronic HFrEF.  She had sleep study about 1 year ago and was told she had sleep apnea but she did not follow up with CPAP. Sleep study repeated 01/12/23 showing mild obstructive sleep apnea.   Admitted 11/30/22 with 3 months of HF symptoms. Echo EF 20% with dyssynchrony, RV moderately down. Moderate to severe MR. Had CT chest. No PE. Subsequently developed AKI and inability to diurese. Advanced HF team consulted. Underwent RHC which showed markedly elevated filling pressures, low output with RV > LV failure, and normal coronaries. Placed on milrinone and diuresed with IV Lasix.  Gradually weaned off milrinone and transitioned to Lasix 40 mg daily. GDMT limited by hypotension, AKI, and low output. Discharged on 12/13/22 with RHC showing RA 4, PCWP 8, Fick CO/CI 6.72/3.04.    At CHF clinic visit on 12/20/22, she reported overall feeling fine. She felt a little tired walking longer distances. Had some shortness of breath walking in the store. Denied PND/Orthopnea. Weight at home was 254-256 pounds.   At last pharmacy visit on 2.8/24, she was overall feeling pretty good. BP was elevated, so losartan was restarted at low dose. Since that visit, she called on 02/04/23 reporting ankle swelling. She took an exta 40 mg x 3 days.   and has continued to improve from last visit. She denies dizziness and lightheadedness. She has continued to have some fatigue, but it has overall improved after hospital discharge. She denies palpitations. Reports chest pain only with coughing. Although breathing has improved, she remains SOB with more intensive ADLs. She had to stop to take one break when walking from the [facility] parking garage to clinic.  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 Weight at home stable ~255 pounds. Takes Lasix 40 mg daily and has not needed any extra. No LEE. No PND/Orthopnea. Appetite is pretty good. Does well with low salt diet. Is going on a cruise in April. She has been connected with a medical supply store and is awaiting insurance approval for CPAP.   HF Medications: Losartan 12.5 mg daily Spironolactone 25 mg daily Jardiance 10 mg daily Digoxin 0.125 mg daily Lasix 40 mg daily   Has the patient been experiencing any side effects to the medications prescribed?  Yes. She feels like she has a mild yeast infection. She was informed that [name] may be contributing. She does not want fluconazole at this time. Counseled on hygiene, OTC options, and asked to reach out again if recurs.    Does the patient have any problems obtaining medications due to transportation or finances?   No   Understanding of regimen: excellent Understanding of indications: excellent Potential of compliance: excellent Patient understands to avoid NSAIDs. Patient understands to avoid decongestants.     Pertinent Lab Values (12/20/22): Serum creatinine 1.04, BUN 8, Potassium 4.2, Sodium 134   Vital Signs: Weight: 253.6 lbs (last clinic weight: 258 lbs) Blood pressure: 136/82 mmHg (no meds yet this AM) Heart rate: 84    Assessment/Plan: 1. Chronic HFrEF, NICM  11/2022 Echo EF < 20%, severe LV dilation, mildly decreased RV systolic function, moderate-severe functional MR, severe TR.  - Cath with elevated filling pressures, thermo  1.7 Fick 2.6. Had repeat cath thermo CI 2.7 Uncertain cause of cardiomyopathy, ?LBBB vs HTN vs viral myocarditis. LBBB is not markedly wide but dyssynchrony on echo. No family history of CHF.  Marland KitchenNYHA III. Volume status improved. Continue Lasix 40 mg daily for now. BNP and BMET pending. - Start losartan 12.5 mg daily. Patient did not tolerate losartan x 2 inpatient, however  vasodilation from concomitant milrinone may have contributed. Clinical status is more stable now and blood pressure is up today. Will monitor labs closely with the first BMET in one week. If she does not tolerate ARB, then she may be a candidate for BiDil. - Continue spironolactone 25 mg daily  - Continue Jardiance 10 mg daily. Mild yeast infection per patient report. She would like to continue Jardiance without treatment at this time. Recommended OTC.  - Continue digoxin 0.125 mg daily. No digoxin lab charted. Has not taken dose yet today. Will check digoxin level.  - Will hold off on beta blocker given recent low output and persistent albeit improved shortness of breath with ADLs. Can consider starting in the future. - Check BMET, BNP, digoxin level  - Plan to check ECHO after GDMT optimized.    2.  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 Follow with diuresis and GDMT (repeat echo in 3 months).    3. 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     4. HTN:  - Stable today.     6. H/O AKI: Suspect cardiorenal, baseline creatinine around 1. Creatinine up to 2.1 inpatient and improved to 1.15 in 12/2022.  - Check BMET today    7. OSA - Had sleep study over a year ago and was diagnosed. This will be important to address.  - She has daytime fatigue, snores, and un refreshed sleep.  - Sleep study showed OSA and CPAP ordered through [facility].   8. Obesity. - H/O Bariatric Surgery - Body mass index is 43.03 kg/m. - Discussed low salt food choices and seasonings.   Follow up in 1 week for BMET. Follow up in 3-4 weeks with pharmacy clinic.  Assessment/Plan: 1. Chronic systolic CHF (EF ***), due to ***. NYHA class *** symptoms.   - Basic disease state pathophysiology, medication indication, mechanism and side effects reviewed at length with patient and he verbalized understanding  Follow up ***   Audry Riles, PharmD,  BCPS, BCCP, CPP Heart Failure Clinic Pharmacist [phone]

## 2023-02-10 ENCOUNTER — Other Ambulatory Visit (HOSPITAL_COMMUNITY): Payer: Self-pay

## 2023-02-10 ENCOUNTER — Encounter: Payer: Self-pay | Admitting: Hematology and Oncology

## 2023-02-10 ENCOUNTER — Ambulatory Visit (HOSPITAL_COMMUNITY)
Admission: RE | Admit: 2023-02-10 | Discharge: 2023-02-10 | Disposition: A | Discharge status: Home / Self Care | Payer: 59 | Source: Ambulatory Visit | Attending: Cardiology | Admitting: Cardiology

## 2023-02-10 VITALS — BP 110/74 | HR 85

## 2023-02-10 DIAGNOSIS — Z6841 Body Mass Index (BMI) 40.0 and over, adult: Secondary | ICD-10-CM | POA: Insufficient documentation

## 2023-02-10 DIAGNOSIS — G4733 Obstructive sleep apnea (adult) (pediatric): Secondary | ICD-10-CM | POA: Diagnosis not present

## 2023-02-10 DIAGNOSIS — Z9884 Bariatric surgery status: Secondary | ICD-10-CM | POA: Insufficient documentation

## 2023-02-10 DIAGNOSIS — E669 Obesity, unspecified: Secondary | ICD-10-CM | POA: Diagnosis not present

## 2023-02-10 DIAGNOSIS — I428 Other cardiomyopathies: Secondary | ICD-10-CM | POA: Diagnosis not present

## 2023-02-10 DIAGNOSIS — I5022 Chronic systolic (congestive) heart failure: Secondary | ICD-10-CM | POA: Insufficient documentation

## 2023-02-10 DIAGNOSIS — I110 Hypertensive heart disease with heart failure: Secondary | ICD-10-CM | POA: Insufficient documentation

## 2023-02-10 MED ORDER — FLUCONAZOLE 150 MG PO TABS
150.0000 mg | ORAL_TABLET | Freq: Every day | ORAL | 0 refills | Status: DC
  Filled 2023-02-10: qty 1, 1d supply, fill #0

## 2023-02-10 MED ORDER — LOSARTAN POTASSIUM 25 MG PO TABS
25.0000 mg | ORAL_TABLET | Freq: Every day | ORAL | 3 refills | Status: DC
  Filled 2023-02-10: qty 30, 30d supply, fill #0
  Filled 2023-03-11: qty 30, 30d supply, fill #1

## 2023-02-10 MED ORDER — EMPAGLIFLOZIN 10 MG PO TABS
10.0000 mg | ORAL_TABLET | Freq: Every day | ORAL | 3 refills | Status: DC
  Filled 2023-02-10: qty 30, 30d supply, fill #0
  Filled 2023-03-11: qty 30, 30d supply, fill #1

## 2023-02-10 NOTE — Progress Notes (Signed)
Advanced Heart Failure Clinic Note   PCP: She is establishing at [facility]  Primary HF Cardiologist: Dr Haroldine Laws  HPI:    [name] is a 57 y/o woman with HTN, morbid obesity s/p bariatric surgery, LBBB (130-140 ms), IDA, LBBB, OSA, and chronic HFrEF.  She had sleep study about 1 year ago and was told she had sleep apnea but she did not follow up with CPAP. Sleep study repeated 01/12/23 showing mild obstructive sleep apnea.   Admitted 11/30/22 with 3 months of HF symptoms. Echo EF 20% with dyssynchrony, RV moderately down. Moderate to severe MR. Had CT chest. No PE. Subsequently developed AKI and inability to diurese. Advanced HF team consulted. Underwent RHC which showed markedly elevated filling pressures, low output with RV > LV failure, and normal coronaries. Placed on milrinone and diuresed with IV Lasix.  Gradually weaned off milrinone and transitioned to Lasix 40 mg daily. GDMT limited by hypotension, AKI, and low output. Discharged on 12/13/22 with RHC showing RA 4, PCWP 8, Fick CO/CI 6.72/3.04.    At CHF clinic visit on 12/20/22, she reported overall feeling fine. She felt a little tired walking longer distances. Had some shortness of breath walking in the store. Denied PND/Orthopnea. Weight at home was 254-256 pounds.   At last pharmacy visit on 01/16/23, she was overall feeling pretty good. BP was elevated, so losartan was restarted at low dose. Since that visit, she called on 02/04/23 reporting ankle swelling. She took an exta 40 mg of furosemide daily x 3 days.  She is overall feeling well. She reports orthostasis twice at bedtime since restarting her losartan. This occurred around the time of her increased furosemide dose. Denies fatigue. Denies palpitations. Reports dull chest pain associated with acidic foods. SOB has improved since last visit. She did not take any breaks when walking to clinic this morning. 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  Weight at home stable ~254 pounds. Takes furosemide 40 mg daily. No LEE since increasing furosemide dose x 3 days. No PND/Orthopnea. Appetite is good. Does well with low salt diet. Is going on a cruise in April. She plans to pick up CPAP tomorrow. BP at home is generally 120s/60s.   HF Medications: Losartan 12.5 mg daily Spironolactone 25 mg daily Jardiance 10 mg daily Digoxin 0.125 mg daily Lasix 40 mg daily   Has the patient been experiencing any side effects to the medications prescribed?  Yes. Her mild yeast infection has persisted. She has not taken any OTC medications.   Does the patient have any problems obtaining medications due to transportation or finances?   No   Understanding of regimen: excellent Understanding of indications: excellent Potential of compliance: excellent Patient understands to avoid NSAIDs. Patient understands to avoid decongestants.     Pertinent Lab Values (12/20/22): Serum creatinine 1.04, BUN 8, Potassium 4.2, Sodium 134   Vital Signs: Weight: 253.6 lbs (last clinic weight: 258 lbs) Blood pressure: 110/74 mmHg  Heart rate: 85   Assessment/Plan: 1. Chronic HFrEF, NICM  11/2022 Echo EF < 20%, severe LV dilation, mildly decreased RV systolic function, moderate-severe functional MR, severe TR.  - Cath with elevated filling pressures, thermo 1.7 Fick 2.6. Had repeat cath thermo CI 2.7 Uncertain cause of cardiomyopathy, ?LBBB vs HTN vs viral myocarditis. LBBB was not markedly wide but there was dyssynchrony on echo. No family history of CHF.  NYHA II-III today. Appears euvolemic today. Continue furosemide 40 mg daily  for now.  - Tolerated losartan 12.5 mg daily. BMET was ok. Increase losartan to 25 mg daily. BMET at next visit. She will monitor home blood pressures carefully and call us if she develops hypotension.  - Continue spironolactone 25 mg daily  - Continue Jardiance 10 mg daily. Mild yeast infection per patient report. This is her first  occurrence. Will give fluconazole 150 mg x 1. Will monitor for recurrence.  - Continue digoxin 0.125 mg daily. No digoxin lab charted. Will need digoxin level on 03/06/23.  - Will hold off on beta blocker for now given recent low output and recent lower extremity edema requiring short term furosemide increase. - Plan to check ECHO after GDMT optimized.    2.  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 Follow with diuresis and GDMT (repeat echo in 3 months).    3. 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     4. HTN:  - Stable today. Increase losartan as above.   6. H/O AKI: Suspect cardiorenal, baseline creatinine around 1. Creatinine up to 2.1 inpatient and improved to 1.04 in 01/23/2023.  - Check BMET today    7. OSA - Had sleep study over a year ago and was diagnosed. This will be important to address.  - She has daytime fatigue, snores, and un refreshed sleep.  - Sleep study showed OSA and CPAP ordered through [facility]. Patient will pick up CPAP tomorrow.   8. Obesity. - H/O Bariatric Surgery - Body mass index is 43.03 kg/m. - Discussed low salt food choices and seasonings.   Follow up with MD on 03/06/23.  Audry Riles, PharmD, BCPS, BCCP, CPP Heart Failure Clinic Pharmacist [phone]

## 2023-02-10 NOTE — Patient Instructions (Addendum)
It was a pleasure seeing you today!  MEDICATIONS: -We are changing your medications today -Increase losartan to 25 mg daily. If you have low blood pressures, please let us know. - If you experience leg swelling again with no shortness of breath or weight change, try elevating legs or using compression socks.  -Call if you have questions about your medications.  LABS: -We will call you if your labs need attention.  NEXT APPOINTMENT: Return to clinic on March 28th with Dr. Haroldine Laws.  In general, to take care of your heart failure: -Limit your fluid intake to 2 Liters (half-gallon) per day.   -Limit your salt intake to ideally 2-3 grams (2000-3000 mg) per day. -Weigh yourself daily and record, and bring that "weight diary" to your next appointment.  (Weight gain of 2-3 pounds in 1 day typically means fluid weight.) -The medications for your heart are to help your heart and help you live longer.   -Please contact us before stopping any of your heart medications.  Call the clinic at [phone] with questions or to reschedule future appointments.

## 2023-02-20 ENCOUNTER — Emergency Department (HOSPITAL_BASED_OUTPATIENT_CLINIC_OR_DEPARTMENT_OTHER): Payer: 59

## 2023-02-20 ENCOUNTER — Other Ambulatory Visit: Payer: Self-pay

## 2023-02-20 ENCOUNTER — Telehealth (HOSPITAL_COMMUNITY): Payer: Self-pay

## 2023-02-20 ENCOUNTER — Encounter (HOSPITAL_BASED_OUTPATIENT_CLINIC_OR_DEPARTMENT_OTHER): Payer: Self-pay | Admitting: Pediatrics

## 2023-02-20 ENCOUNTER — Emergency Department (HOSPITAL_BASED_OUTPATIENT_CLINIC_OR_DEPARTMENT_OTHER)
Admission: EM | Admit: 2023-02-20 | Discharge: 2023-02-20 | Disposition: A | Discharge status: Home / Self Care | Payer: 59 | Attending: Emergency Medicine | Admitting: Emergency Medicine

## 2023-02-20 DIAGNOSIS — R209 Unspecified disturbances of skin sensation: Secondary | ICD-10-CM | POA: Diagnosis not present

## 2023-02-20 DIAGNOSIS — R079 Chest pain, unspecified: Secondary | ICD-10-CM | POA: Diagnosis present

## 2023-02-20 DIAGNOSIS — D72829 Elevated white blood cell count, unspecified: Secondary | ICD-10-CM | POA: Diagnosis not present

## 2023-02-20 DIAGNOSIS — I2510 Atherosclerotic heart disease of native coronary artery without angina pectoris: Secondary | ICD-10-CM | POA: Diagnosis not present

## 2023-02-20 DIAGNOSIS — Z79899 Other long term (current) drug therapy: Secondary | ICD-10-CM | POA: Insufficient documentation

## 2023-02-20 DIAGNOSIS — I509 Heart failure, unspecified: Secondary | ICD-10-CM | POA: Insufficient documentation

## 2023-02-20 DIAGNOSIS — I110 Hypertensive heart disease with heart failure: Secondary | ICD-10-CM | POA: Diagnosis not present

## 2023-02-20 DIAGNOSIS — R110 Nausea: Secondary | ICD-10-CM | POA: Diagnosis not present

## 2023-02-20 LAB — CBC
HCT: 39.1 % (ref 36.0–46.0)
Hemoglobin: 12.4 g/dL (ref 12.0–15.0)
MCH: 23.5 pg — ABNORMAL LOW (ref 26.0–34.0)
MCHC: 31.7 g/dL (ref 30.0–36.0)
MCV: 74.2 fL — ABNORMAL LOW (ref 80.0–100.0)
Platelets: 328 10*3/uL (ref 150–400)
RBC: 5.27 MIL/uL — ABNORMAL HIGH (ref 3.87–5.11)
RDW: 22.9 % — ABNORMAL HIGH (ref 11.5–15.5)
WBC: 11.5 10*3/uL — ABNORMAL HIGH (ref 4.0–10.5)
nRBC: 0 % (ref 0.0–0.2)

## 2023-02-20 LAB — BASIC METABOLIC PANEL
Anion gap: 8 (ref 5–15)
BUN: 11 mg/dL (ref 6–20)
CO2: 26 mmol/L (ref 22–32)
Calcium: 9.3 mg/dL (ref 8.9–10.3)
Chloride: 99 mmol/L (ref 98–111)
Creatinine, Ser: 1.02 mg/dL — ABNORMAL HIGH (ref 0.44–1.00)
GFR, Estimated: >60 mL/min (ref >60)
Glucose, Bld: 96 mg/dL (ref 70–99)
Potassium: 3.9 mmol/L (ref 3.5–5.1)
Sodium: 133 mmol/L — ABNORMAL LOW (ref 135–145)

## 2023-02-20 LAB — BRAIN NATRIURETIC PEPTIDE: B Natriuretic Peptide: 111 pg/mL — ABNORMAL HIGH (ref 0.0–100.0)

## 2023-02-20 LAB — TROPONIN I (HIGH SENSITIVITY)
Troponin I (High Sensitivity): 9 ng/L (ref <18)
Troponin I (High Sensitivity): 9 ng/L (ref <18)

## 2023-02-20 LAB — DIGOXIN LEVEL: Digoxin Level: 1 ng/mL (ref 0.8–2.0)

## 2023-02-20 MED ORDER — IOHEXOL 350 MG/ML SOLN
125.0000 mL | Freq: Once | INTRAVENOUS | Status: AC | PRN
  Administered 2023-02-20: 125 mL via INTRAVENOUS

## 2023-02-20 MED ORDER — ASPIRIN 325 MG PO TABS
325.0000 mg | ORAL_TABLET | Freq: Every day | ORAL | Status: DC
  Administered 2023-02-20: 325 mg via ORAL
  Filled 2023-02-20: qty 1

## 2023-02-20 NOTE — ED Provider Notes (Signed)
[place] [facility] AT [facility] Provider Note   CSN: [id number] Arrival date & time: 02/20/23  1345     History  Chief Complaint  Patient presents with   Chest Pain    Stephanie Gates is a 57 y.o. female.   Chest Pain    Patient with medical history of CAD, CHF, hypertension, OSA presents to the emergency department due to chest pain.  Patient symptoms started about 2 hours prior to arrival, started initially as numbness to the left upper extremity.  She was also having pain in between her shoulder blades which is also in her chest, described as sharp intermittently but mostly feels "like gas".  The chest pain resolved but she still has pressure in her back.  Associated with nausea but no vomiting.  States that she is now having numbness to the right hand but the left arm numbness has resolved somewhat.  No headache, neck pain, lower extremity swelling.  Digoxin level was increased 3 days ago but no other changes to medication list.   Home Medications Prior to Admission medications   Medication Sig Start Date End Date Taking? Authorizing Provider  digoxin (LANOXIN) 0.125 MG tablet Take 1 tablet (0.125 mg total) by mouth daily. 01/13/23   Larey Dresser, MD  empagliflozin (JARDIANCE) 10 MG TABS tablet Take 1 tablet (10 mg total) by mouth daily. 02/10/23   Larey Dresser, MD  fluconazole (DIFLUCAN) 150 MG tablet Take 1 tablet (150 mg total) by mouth daily. 02/10/23   Larey Dresser, MD  furosemide (LASIX) 40 MG tablet Take 1 tablet (40 mg total) by mouth daily. 01/13/23   Larey Dresser, MD  losartan (COZAAR) 25 MG tablet Take 1 tablet (25 mg total) by mouth daily. 02/10/23   Larey Dresser, MD  Multiple Vitamin (MULTIVITAMIN) capsule Take 1 capsule by mouth daily.    [provider]  pantoprazole (PROTONIX) 40 MG tablet TAKE 1 TABLET(40 MG) BY MOUTH DAILY 01/23/22   Jonetta Osgood, NP  potassium chloride SA (KLOR-CON M) 20 MEQ tablet Take 1  tablet (20 mEq total) by mouth daily. 01/13/23   Larey Dresser, MD  spironolactone (ALDACTONE) 25 MG tablet Take 1 tablet (25 mg total) by mouth daily. 01/13/23   Larey Dresser, MD      Allergies    Bisoprolol, Penicillin g, and Penicillins    Review of Systems   Review of Systems  Cardiovascular:  Positive for chest pain.    Physical Exam Updated Vital Signs BP 104/70   Pulse 75   Temp 98.4 F (36.9 C) (Oral)   Resp 16   Ht '5\' 5"'$  (1.651 m)   Wt 114.8 kg   LMP 02/11/2017   SpO2 93%   BMI 42.10 kg/m  Physical Exam Vitals and nursing note reviewed. Exam conducted with a chaperone present.  Constitutional:      Appearance: Normal appearance.  HENT:     Head: Normocephalic and atraumatic.  Eyes:     General: No scleral icterus.       Right eye: No discharge.        Left eye: No discharge.     Extraocular Movements: Extraocular movements intact.     Pupils: Pupils are equal, round, and reactive to light.  Cardiovascular:     Rate and Rhythm: Normal rate and regular rhythm.     Pulses: Normal pulses.     Heart sounds: Murmur heard.     No friction  rub. No gallop.     Comments: Upper and lower extremity pulses 2+ symmetric bilaterally Pulmonary:     Effort: Pulmonary effort is normal. No respiratory distress.     Breath sounds: Normal breath sounds.  Abdominal:     General: Abdomen is flat. Bowel sounds are normal. There is no distension.     Palpations: Abdomen is soft.     Tenderness: There is no abdominal tenderness.  Musculoskeletal:     Right lower leg: Edema present.     Left lower leg: Edema present.     Comments: Trace edema ankles bilaterally  Skin:    General: Skin is warm and dry.     Coloration: Skin is not jaundiced.  Neurological:     Mental Status: She is alert. Mental status is at baseline.     Coordination: Coordination normal.     Comments: Cranial nerves II to XII are grossly intact right upper and lower extremity strength is symmetric  bilaterally.     ED Results / Procedures / Treatments   Labs (all labs ordered are listed, but only abnormal results are displayed) Labs Reviewed  BASIC METABOLIC PANEL - Abnormal; Notable for the following components:      Result Value   Sodium 133 (*)    Creatinine, Ser 1.02 (*)    All other components within normal limits  CBC - Abnormal; Notable for the following components:   WBC 11.5 (*)    RBC 5.27 (*)    MCV 74.2 (*)    MCH 23.5 (*)    RDW 22.9 (*)    All other components within normal limits  BRAIN NATRIURETIC PEPTIDE - Abnormal; Notable for the following components:   B Natriuretic Peptide 111.0 (*)    All other components within normal limits  DIGOXIN LEVEL  TROPONIN I (HIGH SENSITIVITY)  TROPONIN I (HIGH SENSITIVITY)    EKG None  Radiology CT Angio Chest/Abd/Pel for Dissection W and/or W/WO  Result Date: 02/20/2023 CLINICAL DATA:  Acute aortic syndrome suspected EXAM: CT ANGIOGRAPHY CHEST, ABDOMEN AND PELVIS TECHNIQUE: Non-contrast CT of the chest was initially obtained. Multidetector CT imaging through the chest, abdomen and pelvis was performed using the standard protocol during bolus administration of intravenous contrast. Multiplanar reconstructed images and MIPs were obtained and reviewed to evaluate the vascular anatomy. RADIATION DOSE REDUCTION: This exam was performed according to the departmental dose-optimization program which includes automated exposure control, adjustment of the mA and/or kV according to patient size and/or use of iterative reconstruction technique. CONTRAST:  177m OMNIPAQUE IOHEXOL 350 MG/ML SOLN COMPARISON:  None Available. FINDINGS: CTA CHEST FINDINGS Cardiovascular: Cardiomegaly. No pericardial effusion. No suspicious filling defects of the central pulmonary arteries. Mediastinum/Nodes: Thyroid esophagus are unremarkable. No pathologically enlarged lymph nodes seen in the chest. Lungs/Pleura: Central airways are patent. No consolidation,  pleural effusion or pneumothorax. Musculoskeletal: No chest wall abnormality. No acute or significant osseous findings. Review of the MIP images confirms the above findings. CTA ABDOMEN AND PELVIS FINDINGS VASCULAR Aorta: Normal caliber aorta without aneurysm, dissection, vasculitis or significant stenosis. Celiac: Patent without evidence of aneurysm, dissection, vasculitis or significant stenosis. SMA: Patent without evidence of aneurysm, dissection, vasculitis or significant stenosis. Renals: Bilateral renal arteries are patent without evidence of aneurysm, dissection, vasculitis, fibromuscular dysplasia or significant stenosis. Small accessory renal artery to the left lower pole. IMA: Patent without evidence of aneurysm, dissection, vasculitis or significant stenosis. Inflow: Patent without evidence of aneurysm, dissection, vasculitis or significant stenosis. Veins: No obvious venous abnormality  within the limitations of this arterial phase study. Review of the MIP images confirms the above findings. NON-VASCULAR Hepatobiliary: No focal liver abnormality is seen. No gallstones, gallbladder wall thickening, or biliary dilatation. Pancreas: Unremarkable. No pancreatic ductal dilatation or surrounding inflammatory changes. Spleen: Normal in size without focal abnormality. Adrenals/Urinary Tract: Bilateral adrenal glands are unremarkable. Hydronephrosis or nephrolithiasis. Bladder is unremarkable. 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 Appendix appears normal. No evidence of bowel wall thickening, distention, or inflammatory changes. Lymphatic: No enlarged lymph nodes seen in the abdomen or pelvis. Reproductive: Uterus and bilateral adnexa are unremarkable. Other: No abdominal wall hernia or abnormality. No abdominopelvic ascites. Musculoskeletal: No acute or significant osseous findings. Review of the MIP images confirms the above findings. IMPRESSION: 1. No evidence of acute aortic  syndrome. 2. No acute findings in the chest, abdomen or pelvis. 3. Cardiomegaly. Electronically Signed   By: Yetta Glassman M.D.   On: 02/20/2023 17:43   DG Chest 2 View  Result Date: 02/20/2023 CLINICAL DATA:  Chest pain, shortness of breath, LEFT arm numbness, onset of symptoms 1 hour ago at work EXAM: CHEST - 2 VIEW COMPARISON:  12/12/2022 FINDINGS: Enlargement of cardiac silhouette. Mediastinal contours and pulmonary vascularity normal. Lungs clear. No pulmonary infiltrate, pleural effusion, or pneumothorax. Osseous structures unremarkable. IMPRESSION: Mild enlargement of cardiac silhouette. No acute pulmonary abnormalities. Electronically Signed   By: Lavonia Dana M.D.   On: 02/20/2023 14:25    Procedures Procedures    Medications Ordered in ED Medications  aspirin tablet 325 mg (325 mg Oral Given 02/20/23 1525)  iohexol (OMNIPAQUE) 350 MG/ML injection 125 mL (125 mLs Intravenous Contrast Given 02/20/23 1656)    ED Course/ Medical Decision Making/ A&P                              Medical Decision Making Amount and/or Complexity of Data Reviewed Labs: ordered. Radiology: ordered. Decision-making details documented in ED Course. ECG/medicine tests:  Decision-making details documented in ED Course.  Risk OTC drugs. Prescription drug management.   Patient presents due to chest pain, left upper extremity tingling, back pain.  Differential includes but not limited to ACS, PE, pneumonia, dissection, arrhythmia, CHF exacerbation, radiculopathy, PTX, pericarditis, GERD, hypertensive emergency  On exam patient is upper and lower extremity pulses which are symmetric bilaterally.  Very trace edema, no rales, no signs of respiratory distress reassuringly.  Afebrile, no SIRS criteria.  Patient is not hypoxic. -BP (!) 112/51 (BP Location: Right Arm)   Pulse 78   Temp 98.4 F (36.9 C) (Oral)   Resp 18   Ht '5\' 5"'$  (1.651 m)   Wt 114.8 kg   LMP 02/11/2017   SpO2 100%   BMI 42.10 kg/m    Patient's daughter is an independent story and contributing to history.  Also reviewed external medical records. - Home medicine reviewed, patient is on digoxin we will check dig level. - Last echocardiogram 12/02/2022 EF <20%, severe LV dilation, mildly decreased RV systolic function, moderate-severe functional MR, severe TR. dyssynchrony -12/13/2022 had right/left heart cath: with normal coronaries, nonischemic cardiomyopathy, normal filling pressures and normal cardiac output.  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 CBC with mild  leukocytosis 11.5, no anemia BMP - no gross electrolyte derangement or AKI Digoxin level 1 Troponin 9--->9, delta 0 BNP is slightly elevated at 111 but actually significantly improved compared to previous per chart review.  Chest Xray - No vascular congestion, consolidation, widened mediastinum or pneumothorax.  Some mild cardiomegaly, agree with radiologist  EKG - LBBB, sinus rhythm possible 1st av block.  No specific ischemic findings.  No diffuse elevation or signs of pericarditis.    Patient is on cardiac monitoring in sinus rhythm per my interpretation.  CTA dissection study is negative.    Negative delta troponin, no new EKG findings, do not think ACS.  Workup overall reassuring, with patient follow-up with cardiologist.  Return precautions discussed, stable for outpatient follow-up.        Final Clinical Impression(s) / ED Diagnoses Final diagnoses:  Nonspecific chest pain    Rx / DC Orders ED Discharge Orders     None         Sherrill Raring, PA-C 02/20/23 2246    Drenda Freeze, MD 02/21/23 [id number]

## 2023-02-20 NOTE — ED Notes (Signed)
Cannot discharge, registration in chart.   

## 2023-02-20 NOTE — Discharge Instructions (Signed)
Workup today was reassuring.,  Please follow-up your cardiologist this week for reevaluation.  Return to the ED if the chest pain worsens or returns.  Call your doctor tomorrow and can extend the ED stay to be seen by them in the outpatient setting.  Taking all of your home medications as currently prescribed.

## 2023-02-20 NOTE — Telephone Encounter (Signed)
Patient called indicating increasing left arm numbness and pain in her back. She denied chest pain or shortness of  breath. I advised her to go to ER to be evaluated. She agreed and will go today.

## 2023-02-20 NOTE — ED Notes (Signed)
Discharge paperwork reviewed entirely with patient, including Rx's and follow up care. Pain was under control. Pt verbalized understanding as well as all parties involved. No questions or concerns voiced at the time of discharge. No acute distress noted.   Pt ambulated out to PVA without incident or assistance.  

## 2023-02-20 NOTE — ED Triage Notes (Signed)
C/O chest pain, shortness of breath and left arm numbness 1 hr ago while seating at work.Reports recently diagnosed with HF, weigh self daily with no significant gain.

## 2023-03-03 ENCOUNTER — Other Ambulatory Visit (HOSPITAL_COMMUNITY): Payer: Self-pay | Admitting: *Deleted

## 2023-03-03 DIAGNOSIS — I5022 Chronic systolic (congestive) heart failure: Secondary | ICD-10-CM

## 2023-03-06 ENCOUNTER — Ambulatory Visit (HOSPITAL_COMMUNITY)
Admission: RE | Admit: 2023-03-06 | Discharge: 2023-03-06 | Disposition: A | Discharge status: Home / Self Care | Payer: 59 | Source: Ambulatory Visit | Attending: Cardiology | Admitting: Cardiology

## 2023-03-06 ENCOUNTER — Ambulatory Visit (HOSPITAL_BASED_OUTPATIENT_CLINIC_OR_DEPARTMENT_OTHER)
Admission: RE | Admit: 2023-03-06 | Discharge: 2023-03-06 | Disposition: A | Discharge status: Home / Self Care | Payer: 59 | Source: Ambulatory Visit

## 2023-03-06 ENCOUNTER — Encounter (HOSPITAL_COMMUNITY): Payer: Self-pay | Admitting: Internal Medicine

## 2023-03-06 VITALS — BP 106/68 | HR 86 | Wt 260.0 lb

## 2023-03-06 DIAGNOSIS — I428 Other cardiomyopathies: Secondary | ICD-10-CM | POA: Insufficient documentation

## 2023-03-06 DIAGNOSIS — I447 Left bundle-branch block, unspecified: Secondary | ICD-10-CM | POA: Diagnosis not present

## 2023-03-06 DIAGNOSIS — Z79899 Other long term (current) drug therapy: Secondary | ICD-10-CM | POA: Insufficient documentation

## 2023-03-06 DIAGNOSIS — I5022 Chronic systolic (congestive) heart failure: Secondary | ICD-10-CM

## 2023-03-06 DIAGNOSIS — I110 Hypertensive heart disease with heart failure: Secondary | ICD-10-CM | POA: Insufficient documentation

## 2023-03-06 DIAGNOSIS — Z7984 Long term (current) use of oral hypoglycemic drugs: Secondary | ICD-10-CM | POA: Insufficient documentation

## 2023-03-06 DIAGNOSIS — G4733 Obstructive sleep apnea (adult) (pediatric): Secondary | ICD-10-CM | POA: Diagnosis not present

## 2023-03-06 DIAGNOSIS — I081 Rheumatic disorders of both mitral and tricuspid valves: Secondary | ICD-10-CM | POA: Insufficient documentation

## 2023-03-06 DIAGNOSIS — Z6841 Body Mass Index (BMI) 40.0 and over, adult: Secondary | ICD-10-CM | POA: Insufficient documentation

## 2023-03-06 DIAGNOSIS — R0602 Shortness of breath: Secondary | ICD-10-CM | POA: Diagnosis not present

## 2023-03-06 DIAGNOSIS — Z9884 Bariatric surgery status: Secondary | ICD-10-CM | POA: Insufficient documentation

## 2023-03-06 DIAGNOSIS — I252 Old myocardial infarction: Secondary | ICD-10-CM | POA: Insufficient documentation

## 2023-03-06 LAB — BASIC METABOLIC PANEL
Anion gap: 10 (ref 5–15)
BUN: 12 mg/dL (ref 6–20)
CO2: 26 mmol/L (ref 22–32)
Calcium: 9.7 mg/dL (ref 8.9–10.3)
Chloride: 101 mmol/L (ref 98–111)
Creatinine, Ser: 0.97 mg/dL (ref 0.44–1.00)
GFR, Estimated: >60 mL/min (ref >60)
Glucose, Bld: 85 mg/dL (ref 70–99)
Potassium: 4.1 mmol/L (ref 3.5–5.1)
Sodium: 137 mmol/L (ref 135–145)

## 2023-03-06 LAB — BRAIN NATRIURETIC PEPTIDE: B Natriuretic Peptide: 128.9 pg/mL — ABNORMAL HIGH (ref 0.0–100.0)

## 2023-03-06 LAB — ECHOCARDIOGRAM COMPLETE
Area-P 1/2: 4.74 cm2
Calc EF: 24.5 %
MV M vel: 4.1 m/s
MV Peak grad: 67.2 mmHg
MV Vena cont: 0.6 cm
Radius: 0.6 cm
S' Lateral: 5.9 cm
Single Plane A2C EF: 16.8 %
Single Plane A4C EF: 32.9 %

## 2023-03-06 MED ORDER — METOPROLOL SUCCINATE ER 25 MG PO TB24: 25.0000 mg | ORAL_TABLET | Freq: Every day | ORAL | 5 refills | Status: DC

## 2023-03-06 NOTE — Patient Instructions (Addendum)
Good to see you today!   START Toprol 25 mg daily  Your physician has requested that you have a cardiac MRI. Cardiac MRI uses a computer to create images of your heart as its beating, producing both still and moving pictures of your heart and major blood vessels. For further information please visit [email or web address] Please follow the instruction sheet given to you today for more information. Once insurance approves we will call to schedule an appointment  You have been referred to Electrophsiology dr. Curt Bears That office will call to schedule an appointment  Your physician recommends that you schedule a follow-up appointment in: 2 months with app clinic  If you have any questions or concerns before your next appointment please send [place] a message through [facility] or call our office at [phone].    TO LEAVE A MESSAGE FOR THE NURSE SELECT OPTION 2, PLEASE LEAVE A MESSAGE INCLUDING: YOUR NAME DATE OF BIRTH CALL BACK NUMBER REASON FOR CALL**this is important as we prioritize the call backs  YOU WILL RECEIVE A CALL BACK THE SAME DAY AS LONG AS YOU CALL BEFORE 4:00 PM  At the [facility], you and your health needs are our priority. As part of our continuing mission to provide you with exceptional heart care, we have created designated Provider Care Teams. These Care Teams include your primary Cardiologist (physician) and Advanced Practice Providers (APPs- Physician Assistants and Nurse Practitioners) who all work together to provide you with the care you need, when you need it.   You may see any of the following providers on your designated Care Team at your next follow up: Dr Glori Bickers Dr Loralie Champagne Dr. Roxana Hires, NP Lyda Jester, [place] [facility] [place], [place] Forestine Na, NP Audry Riles, PharmD   Please be sure to bring in all your medications bottles to every appointment.    Thank you for choosing [facility]

## 2023-03-06 NOTE — Progress Notes (Signed)
PCP: She is establishing at [facility]  Primary HF Cardiologist: Dr Haroldine Laws   ADVANCED HF CLINC NOTE  HPI:  Stephanie Gates is a 57 y/o woman with HTN, morbid obesity s/p bariatric surgery, LBBB (130-140 Stephanie), IDA, LBBB, OSA, and chronic HFrEF.  She had sleep study about 1 year ago and was told she had sleep apnea but she did not follow up with CPAP.   Admitted 12/23 with acute HF symptoms. Echo EF 20% with dyssynchrony, RV moderately down. Moderate to severe MR. Had CT chest. No PE. Subsequently developed AKI and inability to diurese. Advanced HF team consulted. Underwent L/RHC which showed markedly elevated filling pressures, low output with RV > LV failure, and normal coronaries.    Today she returns for HF follow up with her daughter. Works at [employer]. Sedentary job. Fatigues easily. SOB with mild activity. Still with some CP. Occasional edema and will take extra lasix as needed. No orthopnea or PND. Compliant with meds  Echo today 03/06/23 EF 20% septal dyssynergy    2D Echo 12/02/22 LV < 20% global HK, grade IIDD. RV mildly reduced RA/LA mildly dilated. . Functional mitral regurgitation due to left ventricular disfunction and annular dilatation. The mitral valve is degenerative. Moderate to severe mitral valve regurgitation. No evidence of mitral stenosis. Tricuspid valve regurgitation is severe. T  RHC 12/06/22 RA = 22 RV = 39/22 PA = 49/26 (33) PCW = 23  Fick cardiac output/index = 6.1/2.6 TD CO/CI = 4.1/1.7 PVR = 1.6 (Fick) 2.5 (TD) Ao sat = 99% PA sat = 64%, 66% SVC sat = 65% PaPI = 1.0    R/LHC 12/13/22   RA mean 4 RV 41/8 PA 32/16, mean 23 PCWP mean 8 LV 112/16 AO 113/57 Oxygen saturations: PA 70% AO 98% Cardiac Output (Fick) 6.72  Cardiac Index (Fick) 3.04 Cardiac Output (Thermo) 5.90 Cardiac Index (Thermo) 2.67 PVR 2.5 WU PAPi 4   ROS: All systems negative except as listed in HPI, PMH and Problem List.  SH:  Social History   Socioeconomic  History   Marital status: Single    Spouse name: Not on file   Number of children: 1   Years of education: Not on file   Highest education level: Bachelor's degree (e.g., BA, AB, BS)  Occupational History   Occupation: Manufacturing engineer  Tobacco Use   Smoking status: Never   Smokeless tobacco: Never   Tobacco comments:    Never smoke 12/20/22  Vaping Use   Vaping Use: Never used  Substance and Sexual Activity   Alcohol use: No   Drug use: No   Sexual activity: Not Currently  Other Topics Concern   Not on file  Social History Narrative   Not on file   Social Determinants of Health   Financial Resource Strain: Low Risk  (12/05/2022)   Overall Financial Resource Strain (CARDIA)    Difficulty of Paying Living Expenses: Not very hard  Food Insecurity: No Food Insecurity (12/16/2022)   Hunger Vital Sign    Worried About Running Out of Food in the Last Year: Never true    Ran Out of Food in the Last Year: Never true  Transportation Needs: No Transportation Needs (12/05/2022)   PRAPARE - Hydrologist (Medical): No    Lack of Transportation (Non-Medical): No  Physical Activity: Not on file  Stress: Not on file  Social Connections: Not on file  Intimate Partner Violence: Not on file    FH:  Family History  Problem Relation Age of Onset   Heart failure Mother    Diabetes Mother    Hypertension Mother    Cancer Father    Diabetes Father    Hypertension Father    Heart failure Brother 30    Past Medical History:  Diagnosis Date   Allergy    seasonal   Anemia    Arthritis    right hip   Gastritis    Headache    sinus/allergies   Hypertension    Seasonal allergies    Vertigo    1-2x/month   Wears contact lenses     Current Outpatient Medications  Medication Sig Dispense Refill   digoxin (LANOXIN) 0.125 MG tablet Take 1 tablet (0.125 mg total) by mouth daily. 30 tablet 5   empagliflozin (JARDIANCE) 10 MG TABS tablet Take 1 tablet  (10 mg total) by mouth daily. 90 tablet 3   fluconazole (DIFLUCAN) 150 MG tablet Take 1 tablet (150 mg total) by mouth daily. 1 tablet 0   furosemide (LASIX) 40 MG tablet Take 1 tablet (40 mg total) by mouth daily. 30 tablet 5   losartan (COZAAR) 25 MG tablet Take 1 tablet (25 mg total) by mouth daily. 90 tablet 3   metoprolol succinate (TOPROL XL) 25 MG 24 hr tablet Take 1 tablet (25 mg total) by mouth daily. 60 tablet 5   Multiple Vitamin (MULTIVITAMIN) capsule Take 1 capsule by mouth daily.     pantoprazole (PROTONIX) 40 MG tablet TAKE 1 TABLET(40 MG) BY MOUTH DAILY 90 tablet 3   potassium chloride SA (KLOR-CON M) 20 MEQ tablet Take 1 tablet (20 mEq total) by mouth daily. 30 tablet 5   spironolactone (ALDACTONE) 25 MG tablet Take 1 tablet (25 mg total) by mouth daily. 30 tablet 5   No current facility-administered medications for this encounter.    Vitals:   03/06/23 1057  BP: 106/68  Pulse: 86  SpO2: 100%  Weight: 117.9 kg (260 lb)    Wt Readings from Last 3 Encounters:  03/06/23 117.9 kg (260 lb)  02/20/23 114.8 kg (253 lb)  01/16/23 115 kg (253 lb 9.6 oz)    PHYSICAL EXAM: General:  Well appearing. No resp difficulty HEENT: normal Neck: supple. no JVD. Carotids 2+ bilat; no bruits. No lymphadenopathy or thryomegaly appreciated. Cor: PMI nondisplaced. Regular rate & rhythm. No rubs, gallops or murmurs. Lungs: clear Abdomen: obese soft, nontender, nondistended. No hepatosplenomegaly. No bruits or masses. Good bowel sounds. Extremities: no cyanosis, clubbing, rash, edema Neuro: alert & orientedx3, cranial nerves grossly intact. moves all 4 extremities w/o difficulty. Affect pleasant  ECG: SR 74 1AVB 204 Stephanie QRS 143 Stephanie    ASSESSMENT & PLAN:  1. Chronic HFrEF, NICM  - Developed acute HF 11/2022  - Echo 12/23 EF < 20%, severe LV dilation, mildly decreased RV systolic function, moderate-severe functional MR, severe TR.  - Cath 12/23 with elevated filling pressures. Normal  cors - Uncertain cause of cardiomyopathy, ?LBBB vs HTN vs viral myocarditis.  LBBB is not markedly wide but significant dyssynchrony on echo. No family history of CHF.  - NYHA III. Volume status stable. Continue lasix 40 mg daily  - Continue digoxin 0.125 mg daily  - Continue spironolactone 25 mg daily  - Continue jardiance 10 mg daily  - Continue losartan 25  - Add Toprol - Suspect LBBB CM - Proceed with cMRI - Refer to EP for possible CRT-D - Labs today  2. Severe MR/TR: Suspect functional related  to cardiomyopathy.  Follow with diuresis and GDMT (repeat echo in  3 months).   3. 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   - QRS on EKG 143 Stephanie today - plan as above  4. HTN:  - Stable today.    5. OSA - Now on CPAP. continue  6. Obesity. - H/O Bariatric Surgery - Body mass index is 43.27 kg/m. - Continue weight loss efforts - Consider [id number]   Glori Bickers, MD  11:38 PM

## 2023-03-12 ENCOUNTER — Other Ambulatory Visit: Payer: Self-pay

## 2023-03-12 ENCOUNTER — Other Ambulatory Visit (HOSPITAL_COMMUNITY): Payer: Self-pay

## 2023-03-13 ENCOUNTER — Other Ambulatory Visit (HOSPITAL_COMMUNITY): Payer: Self-pay

## 2023-03-14 ENCOUNTER — Encounter (HOSPITAL_COMMUNITY): Payer: 59 | Admitting: Cardiology

## 2023-03-14 ENCOUNTER — Other Ambulatory Visit (HOSPITAL_COMMUNITY): Payer: 59

## 2023-03-17 ENCOUNTER — Telehealth (HOSPITAL_COMMUNITY): Payer: Self-pay | Admitting: Cardiology

## 2023-03-17 MED ORDER — METOPROLOL SUCCINATE ER 25 MG PO TB24: 12.5000 mg | ORAL_TABLET | Freq: Every day | ORAL | 5 refills | Status: DC

## 2023-03-17 NOTE — Telephone Encounter (Signed)
Patient called with concerns of hand numbness and HA x 1 week after starting metoprolol  Denies CP, SOB, visual changes, jaw pain, dizziness,  B/p at the time of call 112/66 HR 76  Please advise

## 2023-03-17 NOTE — Telephone Encounter (Signed)
Pt aware.

## 2023-03-18 ENCOUNTER — Other Ambulatory Visit (HOSPITAL_COMMUNITY): Payer: 59

## 2023-03-26 ENCOUNTER — Telehealth (HOSPITAL_COMMUNITY): Payer: Self-pay | Admitting: Cardiology

## 2023-03-26 NOTE — Telephone Encounter (Signed)
Patient called reports she was given hydrocortisone  chlorphen ER cough suppressant for severe cough Would like to know if safe to start ?interaction with cardiac meds

## 2023-03-27 NOTE — Telephone Encounter (Signed)
Reached out to patient to let her know she is safe to take the chlorpheniramine/hydrocodone with her HF medications. She asked if she would be able to take Claritin or Zyrtec as well. Informed her this is also safe for her to take, however to avoid using it with the Tussinex since both are antihistamine medications.   Irish Elders, PharmD PGY-1 [facility] Resident

## 2023-04-01 ENCOUNTER — Telehealth (HOSPITAL_COMMUNITY): Payer: Self-pay

## 2023-04-01 ENCOUNTER — Other Ambulatory Visit (HOSPITAL_COMMUNITY): Payer: Self-pay

## 2023-04-01 MED ORDER — FLUCONAZOLE 150 MG PO TABS: 150.0000 mg | ORAL_TABLET | Freq: Every day | ORAL | 0 refills | Status: DC

## 2023-04-01 NOTE — Telephone Encounter (Signed)
I spoke to patient and went over changes. She verbalized understanding. Diflucan sent to pharmacy.

## 2023-04-01 NOTE — Telephone Encounter (Signed)
Patient called and has a yeast infection she thinks is from the [place] and asked if we could send in a refill of the Diflucan.

## 2023-04-04 ENCOUNTER — Encounter: Payer: Self-pay | Admitting: Cardiology

## 2023-04-04 ENCOUNTER — Ambulatory Visit: Payer: 59 | Attending: Cardiology | Admitting: Cardiology

## 2023-04-04 ENCOUNTER — Encounter: Payer: Self-pay | Admitting: *Deleted

## 2023-04-04 VITALS — BP 120/68 | HR 66 | Ht 65.0 in | Wt 258.0 lb

## 2023-04-04 DIAGNOSIS — I5022 Chronic systolic (congestive) heart failure: Secondary | ICD-10-CM

## 2023-04-04 DIAGNOSIS — I10 Essential (primary) hypertension: Secondary | ICD-10-CM

## 2023-04-04 DIAGNOSIS — G4733 Obstructive sleep apnea (adult) (pediatric): Secondary | ICD-10-CM | POA: Diagnosis not present

## 2023-04-04 NOTE — Patient Instructions (Signed)
Medication Instructions:  Your physician recommends that you continue on your current medications as directed. Please refer to the Current Medication list given to you today.  *If you need a refill on your cardiac medications before your next appointment, please call your pharmacy*   Lab Work: None ordered If you have labs (blood work) drawn today and your tests are completely normal, you will receive your results only by: MyChart Message (if you have MyChart) OR A paper copy in the mail If you have any lab test that is abnormal or we need to change your treatment, we will call you to review the results.   Testing/Procedures: Your physician has recommended that you have a defibrillator inserted. An implantable cardioverter defibrillator (ICD) is a small device that is placed in your chest or, in rare cases, your abdomen. This device uses electrical pulses or shocks to help control life-threatening, irregular heartbeats that could lead the heart to suddenly stop beating (sudden cardiac arrest). Leads are attached to the ICD that goes into your heart. This is done in the hospital and usually requires an overnight stay. Please see the instruction sheet given to you today for more information.   Follow-Up: At [facility], you and your health needs are our priority.  As part of our continuing mission to provide you with exceptional heart care, we have created designated Provider Care Teams.  These Care Teams include your primary Cardiologist (physician) and Advanced Practice Providers (APPs -  Physician Assistants and Nurse Practitioners) who all work together to provide you with the care you need, when you need it.   Your next appointment:   2 week(s) after your defibrillator implant  The format for your next appointment:   In Person  Provider:   Device clinic for a wound check {   Your physician recommends that you schedule a follow-up appointment in: 3 months with Dr. Elberta Fortis after  your implant.   Thank you for choosing [facility]!!   Dory Horn, RN [phone]  Other Instructions   Cardioverter Defibrillator Implantation An implantable cardioverter defibrillator (ICD) is a small, lightweight, battery-powered device that is placed (implanted) under the skin in the chest or abdomen. Your caregiver may prescribe an ICD if: You have had an irregular heart rhythm (arrhythmia) that originated in the lower chambers of the heart (ventricles). Your heart has been damaged by a disease (such as coronary artery disease) or heart condition (such as a heart attack). An ICD consists of a battery that lasts several years, a small computer called a pulse generator, and wires called leads that go into the heart. It is used to detect and correct two dangerous arrhythmias: a rapid heart rhythm (tachycardia) and an arrhythmia in which the ventricles contract in an uncoordinated way (fibrillation). When an ICD detects tachycardia, it sends an electrical signal to the heart that restores the heartbeat to normal (cardioversion). This signal is usually painless. If cardioversion does not work or if the ICD detects fibrillation, it delivers a small electrical shock to the heart (defibrillation) to restart the heart. The shock may feel like a strong jolt in the chest. ICDs may be programmed to correct other problems. Sometimes, ICDs are programmed to act as another type of implantable device called a pacemaker. Pacemakers are used to treat a slow heartbeat (bradycardia). LET YOUR CAREGIVER KNOW ABOUT: Any allergies you have. All medicines you are taking, including vitamins, herbs, eyedrops, and over-the-counter medicines and creams. Previous problems you or members of your family  have had with the use of anesthetics. Any blood disorders you have had. Other health problems you have. RISKS AND COMPLICATIONS Generally, the procedure to implant an ICD is safe. However, as with any surgical  procedure, complications can occur. Possible complications associated with implanting an ICD include: Swelling, bleeding, or bruising at the site where the ICD was implanted. Infection at the site where the ICD was implanted. A reaction to medicine used during the procedure. Nerve, heart, or blood vessel damage. Blood clots. BEFORE THE PROCEDURE You may need to have blood tests, heart tests, or a chest X-ray done before the day of the procedure. Ask your caregiver about changing or stopping your regular medicines. Make plans to have someone drive you home. You may need to stay in the hospital overnight after the procedure. Stop smoking at least 24 hours before the procedure. Take a bath or shower the night before the procedure. You may need to scrub your chest or abdomen with a special type of soap. Do not eat or drink before your procedure for as long as directed by your caregiver. Ask if it is okay to take any needed medicine with a small sip of water. PROCEDURE  The procedure to implant an ICD in your chest or abdomen is usually done at a hospital in a room that has a large X-ray machine called a fluoroscope. The machine will be above you during the procedure. It will help your caregiver see your heart during the procedure. Implanting an ICD usually takes 1-3 hours. Before the procedure:  Small monitors will be put on your body. They will be used to check your heart, blood pressure, and oxygen level. A needle will be put into a vein in your hand or arm. This is called an intravenous (IV) access tube. Fluids and medicine will flow directly into your body through the IV tube. Your chest or abdomen will be cleaned with a germ-killing (antiseptic) solution. The area may be shaved. You may be given medicine to help you relax (sedative). You will be given a medicine called a local anesthetic. This medicine will make the surgical site numb while the ICD is implanted. You will be sleepy but awake  during the procedure. After you are numb the procedure will begin. The caregiver will: Make a small cut (incision). This will make a pocket deep under your skin that will hold the pulse generator. Guide the leads through a large blood vessel into your heart and attach them to the heart muscles. Depending on the ICD, the leads may go into one ventricle or they may go to both ventricles and into an upper chamber of the heart (atrium). Test the ICD. Close the incision with stitches, glue, or staples. AFTER THE PROCEDURE You may feel pain. Some pain is normal. It may last a few days. You may stay in a recovery area until the local anesthetic has worn off. Your blood pressure and pulse will be checked often. You will be taken to a room where your heart will be monitored. A chest X-ray will be taken. This is done to check that the cardioverter defibrillator is in the right place. You may stay in the hospital overnight. A slight bump may be seen over the skin where the ICD was placed. Sometimes, it is possible to feel the ICD under the skin. This is normal. In the months and years afterward, your caregiver will check the device, the leads, and the battery every few months. Eventually, when the battery  is low, the ICD will be replaced.   This information is not intended to replace advice given to you by your health care provider. Make sure you discuss any questions you have with your health care provider.   Document Released: 08/17/2002 Document Revised: 09/15/2013 Document Reviewed: 12/14/2012 Elsevier Interactive Patient Education 2016 [employer]    Cardioverter Defibrillator Implantation, Care After This sheet gives you information about how to care for yourself after your procedure. Your health care provider may also give you more specific instructions. If you have problems or questions, contact your health care provider. What can I expect after the procedure? After the procedure, it is  common to have: Some pain. It may last a few days. A slight bump over the skin where the device was placed. Sometimes, it is possible to feel the device under the skin. This is normal.  During the months and years after your procedure, your health care provider will check the device, the leads, and the battery every few months. Eventually, when the battery is low, the device will be replaced. Follow these instructions at home: Medicines Take over-the-counter and prescription medicines only as told by your health care provider. If you were prescribed an antibiotic medicine, take it as told by your health care provider. Do not stop taking the antibiotic even if you start to feel better. Incision care  Follow instructions from your health care provider about how to take care of your incision area. Make sure you: Wash your hands with soap and water before you change your bandage (dressing). If soap and water are not available, use hand sanitizer. Change your dressing as told by your health care provider. Leave stitches (sutures), skin glue, or adhesive strips in place. These skin closures may need to stay in place for 2 weeks or longer. If adhesive strip edges start to loosen and curl up, you may trim the loose edges. Do not remove adhesive strips completely unless your health care provider tells you to do that. Check your incision area every day for signs of infection. Check for: More redness, swelling, or pain. More fluid or blood. Warmth. Pus or a bad smell. Do not use lotions or ointments near the incision area unless told by your health care provider. Keep the incision area clean and dry for 2-3 days after the procedure or for as long as told by your health care provider. It takes several weeks for the incision site to heal completely. Do not take baths, swim, or use a hot tub until your health care provider approves. Activity Try to walk a little every day. Exercising is important after  this procedure. Also, use your shoulder on the side of the defibrillator in daily tasks that do not require a lot of motion. For at least 6 weeks: Do not lift your upper arm above your shoulders. This means no tennis, golf, or swimming for this period of time. If you tend to sleep with your arm above your head, use a restraint to prevent this during sleep. Avoid sudden jerking, pulling, or chopping movements that pull your upper arm far away from your body. Ask your health care provider when you may go back to work. Check with your health care provider before you start to drive or play sports. Electric and magnetic fields Tell all health care providers that you have a defibrillator. This may prevent them from giving you an MRI scan because strong magnets are used for that test. If you must pass through  a metal detector, quickly walk through it. Do not stop under the detector, and do not stand near it. Avoid places or objects that have a strong electric or magnetic field, including: Scientist, physiological. At the airport, let officials know that you have a defibrillator. Your defibrillator ID card will let you be checked in a way that is safe for you and will not damage your defibrillator. Also, do not let a security person wave a magnetic wand near your defibrillator. That can make it stop working. Power plants. Large electrical generators. Anti-theft systems or electronic article surveillance (EAS). Radiofrequency transmission towers, such as cell phone and radio towers. Do not use amateur (ham) radio equipment or electric (arc) welding torches. Some devices are safe to use if held at least 12 inches (30 cm) from your defibrillator. These include power tools, lawn mowers, and speakers. If you are unsure if something is safe to use, ask your health care provider. Do not use MP3 player headphones. They have magnets. You may safely use electric blankets, heating pads, computers, and microwave  ovens. When using your cell phone, hold it to the ear that is on the opposite side from the defibrillator. Do not leave your cell phone in a pocket over the defibrillator. General instructions Follow diet instructions from your health care provider, if this applies. Always keep your defibrillator ID card with you. The card should list the implant date, device model, and manufacturer. Consider wearing a medical alert bracelet or necklace. Have your defibrillator checked every 3-6 months or as often as told by your health care provider. Most defibrillators last for 4-8 years. Keep all follow-up visits as told by your health care provider. This is important for your health care provider to make sure your chest is healing the way it should. Ask your health care provider when you should come back to have your stitches or staples taken out. Contact a health care provider if: You feel one shock in your chest. You gain weight suddenly. Your legs or feet swell more than they have before. It feels like your heart is fluttering or skipping beats (heart palpitations). You have more redness, swelling, or pain around your incision. You have more fluid or blood coming from your incision. Your incision feels warm to the touch. You have pus or a bad smell coming from your incision. You have a fever. Get help right away if: You have chest pain. You feel more than one shock. You feel more short of breath than you have felt before. You feel more light-headed than you have felt before. Your incision starts to open up. This information is not intended to replace advice given to you by your health care provider. Make sure you discuss any questions you have with your health care provider. Document Released: 06/14/2005 Document Revised: 06/14/2016 Document Reviewed: 05/01/2016 Elsevier Interactive Patient Education  2018 [employer].     Supplemental Discharge Instructions for  Pacemaker/Defibrillator  Patients  ACTIVITY No heavy lifting or vigorous activity with your left/right arm for 6 to 8 weeks.  Do not raise your left/right arm above your head for one week.  Gradually raise your affected arm as drawn below.           __  NO DRIVING for     ; you may begin driving on     .  WOUND CARE Keep the wound area clean and dry.  Do not get this area wet for one week. No showers for one  week; you may shower on     . The tape/steri-strips on your wound will fall off; do not pull them off.  No bandage is needed on the site.  DO  NOT apply any creams, oils, or ointments to the wound area. If you notice any drainage or discharge from the wound, any swelling or bruising at the site, or you develop a fever > 101? F after you are discharged home, call the office at once.  SPECIAL INSTRUCTIONS You are still able to use cellular telephones; use the ear opposite the side where you have your pacemaker/defibrillator.  Avoid carrying your cellular phone near your device. When traveling through airports, show security personnel your identification card to avoid being screened in the metal detectors.  Ask the security personnel to use the hand wand. Avoid arc welding equipment, MRI testing (magnetic resonance imaging), TENS units (transcutaneous nerve stimulators).  Call the office for questions about other devices. Avoid electrical appliances that are in poor condition or are not properly grounded. Microwave ovens are safe to be near or to operate.  ADDITIONAL INFORMATION FOR DEFIBRILLATOR PATIENTS SHOULD YOUR DEVICE GO OFF: If your device goes off ONCE and you feel fine afterward, notify the device clinic nurses. If your device goes off ONCE and you do not feel well afterward, call 911. If your device goes off TWICE, call 911. If your device goes off THREE TIMES IN ONE DAY, call 911.  DO NOT DRIVE YOURSELF OR A FAMILY MEMBER WITH A DEFIBRILLATOR TO THE HOSPITAL--CALL 911.

## 2023-04-04 NOTE — Progress Notes (Signed)
Electrophysiology Office Note   Date:  04/04/2023   ID:  [name], Stephanie Gates, MRN [id number]  PCP:  Stephanie Kuster, NP  Cardiologist:  Stephanie Gates Primary Electrophysiologist:  Stephanie Sweetin Jorja Loa, MD    Chief Complaint: CHF   History of Present Illness: Stephanie Gates is a 57 y.o. female who is being seen today for the evaluation of CHF at the request of Stephanie Morale, MD. Presenting today for electrophysiology evaluation.  She has a history seen for hypertension, morbid obesity post bariatric surgery, left bundle branch block, OSA, chronic systolic heart failure.  She was admitted to the hospital December 2023 with acute heart failure symptoms and was found to have an ejection fraction of 20%.  She had moderate to severe MR.  Left heart catheterization showed no evidence of coronary artery disease.  Since that time, she is continue to have fatigue.  She gets short of breath with mild activity.  She has no orthopnea or PND.  Today, she denies symptoms of palpitations, chest pain, orthopnea, PND, lower extremity edema, claudication, dizziness, presyncope, syncope, bleeding, or neurologic sequela. The patient is tolerating medications without difficulties.  She continues to have some fatigue, but her shortness of breath has improved.  She is now able to do most of her daily activities on her heart failure medications.   Past Medical History:  Diagnosis Date   Allergy    seasonal   Anemia    Arthritis    right hip   Gastritis    Headache    sinus/allergies   Hypertension    Seasonal allergies    Vertigo    1-2x/month   Wears contact lenses    Past Surgical History:  Procedure Laterality Date   COLONOSCOPY N/A 05/03/2015   Procedure: COLONOSCOPY;  Surgeon: Stephanie Minium, MD;  Location: [facility];  Service: Gastroenterology;  Laterality: N/A;   COLONOSCOPY     COLONOSCOPY WITH PROPOFOL N/A 02/22/2021   Procedure: COLONOSCOPY WITH PROPOFOL;   Surgeon: Stephanie Minium, MD;  Location: [facility] ENDOSCOPY;  Service: Endoscopy;  Laterality: N/A;   ESOPHAGOGASTRODUODENOSCOPY N/A 05/03/2015   Procedure: ESOPHAGOGASTRODUODENOSCOPY (EGD);  Surgeon: Stephanie Minium, MD;  Location: [facility];  Service: Gastroenterology;  Laterality: N/A;   FOOT SURGERY  2014   Gastric bypass surgery     POLYPECTOMY  05/03/2015   Procedure: POLYPECTOMY INTESTINAL;  Surgeon: Stephanie Minium, MD;  Location: [facility];  Service: Gastroenterology;;   RIGHT HEART CATH N/A 12/06/2022   Procedure: RIGHT HEART CATH;  Surgeon: Stephanie Patty, MD;  Location: [facility] INVASIVE CV LAB;  Service: Cardiovascular;  Laterality: N/A;   RIGHT/LEFT HEART CATH AND CORONARY ANGIOGRAPHY N/A 12/13/2022   Procedure: RIGHT/LEFT HEART CATH AND CORONARY ANGIOGRAPHY;  Surgeon: Stephanie Morale, MD;  [facility]: [facility];  Service: Cardiovascular;  Laterality: N/A;     Current Outpatient Medications  Medication Sig Dispense Refill   digoxin (LANOXIN) 0.125 MG tablet Take 1 tablet (0.125 mg total) by mouth daily. 30 tablet 5   empagliflozin (JARDIANCE) 10 MG TABS tablet Take 1 tablet (10 mg total) by mouth daily. 90 tablet 3   fluconazole (DIFLUCAN) 150 MG tablet Take 1 tablet (150 mg total) by mouth daily. 1 tablet 0   furosemide (LASIX) 40 MG tablet Take 1 tablet (40 mg total) by mouth daily. 30 tablet 5   losartan (COZAAR) 25 MG tablet Take 1 tablet (25 mg total) by mouth daily. 90 tablet 3   metoprolol succinate (  TOPROL XL) 25 MG 24 hr tablet Take 0.5 tablets (12.5 mg total) by mouth daily. 15 tablet 5   Multiple Vitamin (MULTIVITAMIN) capsule Take 1 capsule by mouth daily.     pantoprazole (PROTONIX) 40 MG tablet TAKE 1 TABLET(40 MG) BY MOUTH DAILY 90 tablet 3   potassium chloride SA (KLOR-CON M) 20 MEQ tablet Take 1 tablet (20 mEq total) by mouth daily. 30 tablet 5   spironolactone (ALDACTONE) 25 MG tablet Take 1 tablet (25 mg total) by mouth daily. 30 tablet 5   No  current facility-administered medications for this visit.    Allergies:   Bisoprolol, Penicillin g, and Penicillins   Social History:  The patient  reports that she has never smoked. She has never used smokeless tobacco. She reports that she does not drink alcohol and does not use drugs.   Family History:  The patient's family history includes Cancer in her father; Diabetes in her father and mother; Heart failure in her mother; Heart failure (age of onset: 68) in her brother; Hypertension in her father and mother.    ROS:  Please see the history of present illness.   Otherwise, review of systems is positive for none.   All other systems are reviewed and negative.    PHYSICAL EXAM: VS:  BP 120/68   Pulse 66   Ht 5\' 5"  (1.651 m)   Wt 258 lb (117 kg)   LMP 02/11/2017   SpO2 98%   BMI 42.93 kg/m  , BMI Body mass index is 42.93 kg/m. GEN: Well nourished, well developed, in no acute distress  HEENT: normal  Neck: no JVD, carotid bruits, or masses Cardiac: RRR; no murmurs, rubs, or gallops,no edema  Respiratory:  clear to auscultation bilaterally, normal work of breathing GI: soft, nontender, nondistended, + BS MS: no deformity or atrophy  Skin: warm and dry Neuro:  Strength and sensation are intact Psych: euthymic mood, full affect  EKG:  EKG is ordered today. Personal review of the ekg ordered shows sinus rhythm, IVCD (LBBB morphology)  Recent Labs: 11/30/2022: TSH 3.251 12/07/2022: ALT 73 12/13/2022: Magnesium 2.2 02/20/2023: Hemoglobin 12.4; Platelets 328 03/06/2023: B Natriuretic Peptide 128.9; BUN 12; Creatinine, Ser 0.97; Potassium 4.1; Sodium 137    Lipid Panel     Component Value Date/Time   CHOL 94 12/03/2022 0137   CHOL 147 04/15/2022 0712   TRIG 64 12/03/2022 0137   HDL 33 (L) 12/03/2022 0137   HDL 52 04/15/2022 0712   CHOLHDL 2.8 12/03/2022 0137   VLDL 13 12/03/2022 0137   LDLCALC 48 12/03/2022 0137   LDLCALC 78 04/15/2022 0712     Wt Readings from Last 3  Encounters:  04/04/23 258 lb (117 kg)  03/06/23 260 lb (117.9 kg)  02/20/23 253 lb (114.8 kg)      Other studies Reviewed: Additional studies/ records that were reviewed today include: TTE 03/06/23  Review of the above records today demonstrates:   1. Left ventricular ejection fraction, by estimation, is 20 to 25%. The  left ventricle has severely decreased function. The left ventricle  demonstrates global hypokinesis. The left ventricular internal cavity size  was moderately dilated. Left  ventricular diastolic parameters are consistent with Grade II diastolic  dysfunction (pseudonormalization).   2. Right ventricular systolic function is mildly reduced. The right  ventricular size is normal.   3. Left atrial size was moderately dilated.   4. Right atrial size was mildly dilated.   5. The mitral valve is normal in  structure. Moderate mitral valve  regurgitation. No evidence of mitral stenosis.   6. The aortic valve is normal in structure. Aortic valve regurgitation is  not visualized. No aortic stenosis is present.   7. The inferior vena cava is normal in size with <50% respiratory  variability, suggesting right atrial pressure of 8 mmHg.   LHC 12/13/22 1. Normal coronaries, nonischemic cardiomyopathy.  2. Normal filling pressures.  3. Normal cardiac output.   ASSESSMENT AND PLAN:  1.  Chronic systolic heart failure: Due to nonischemic cardiomyopathy.  Currently on optimal medical therapy per heart failure cardiology.  She has a left bundle branch block though not overly wide.  She would benefit from ICD therapy.  Due to her left bundle branch block, CRT would be beneficial.  Risk and benefits were discussed which include bleeding, tamponade, infection, pneumothorax, lead dislodgment, renal failure, stroke, death.  She understands the risks and is agreed to the procedure.  She has plans for cardiac MRI.  Don Tiu need to review her MRI prior to implantation.  Discussed with referring  physician  2.  Hypertension: well controlled  3.  Obstructive sleep apnea: CPAP compliance encouraged  4.  Obesity: Status post bariatric surgery.  Lifestyle modification encouraged Body mass index is 42.93 kg/m.  5.  Severe MR/TR: Has improved with diuresis.  Plan per primary cardiology.  Current medicines are reviewed at length with the patient today.   The patient does not have concerns regarding her medicines.  The following changes were made today:  none  Labs/ tests ordered today include:  Orders Placed This Encounter  Procedures   EKG 12-Lead     Disposition:   FU with Quenten Nawaz 3 months  Signed, Dinna Severs Jorja Loa, MD  04/04/2023 9:34 AM     [facility] [address] [place] [address] [phone] (office) [phone] (fax)

## 2023-04-15 ENCOUNTER — Telehealth: Payer: Self-pay | Admitting: *Deleted

## 2023-04-15 ENCOUNTER — Encounter: Payer: Self-pay | Admitting: Cardiology

## 2023-04-15 ENCOUNTER — Ambulatory Visit: Payer: 59 | Attending: Cardiology | Admitting: Cardiology

## 2023-04-15 VITALS — BP 127/69 | HR 88 | Ht 65.0 in | Wt 254.6 lb

## 2023-04-15 DIAGNOSIS — I5022 Chronic systolic (congestive) heart failure: Secondary | ICD-10-CM

## 2023-04-15 DIAGNOSIS — G4733 Obstructive sleep apnea (adult) (pediatric): Secondary | ICD-10-CM

## 2023-04-15 DIAGNOSIS — I10 Essential (primary) hypertension: Secondary | ICD-10-CM

## 2023-04-15 DIAGNOSIS — R0683 Snoring: Secondary | ICD-10-CM

## 2023-04-15 NOTE — Telephone Encounter (Signed)
Per Dr Mayford Knife, order a chin strap     Order placed to [employer] via community message

## 2023-04-15 NOTE — Progress Notes (Signed)
**Note Stephanie-Identified via Obfuscation** Sleep Medicine Consult via Video Note   Because of Stephanie Gates's co-morbid illnesses, she is at least at moderate risk for complications without adequate follow up.  This format is felt to be most appropriate for this patient at this time.  All issues noted in this document were discussed and addressed.  A limited physical exam was performed with this format.  Please refer to the patient's chart for her consent to telehealth for [facility].      Date:  04/15/2023   ID:  Stephanie Gates, DOB [birth year], MRN [id number] The patient was identified using 2 identifiers.  Patient Location: Home Provider Location: Home Office   PCP:  Sallyanne Kuster, NP   [facility] Providers Cardiologist:  None     Evaluation Performed:  New Patient Evaluation  Chief Complaint:  OSA  History of Present Illness:    Stephanie Gates is a 57 y.o. female who is being seen today for the evaluation of OSA at the request of Tonye Becket, MD.  Celes Mossey is a 57 y.o. female with a history of nonischemic cardiomyopathy with chronic HFrEF with EF less than 20% followed by advanced heart failure, severe MR/TR, hypertension and obesity.  She was complaining that she snored a lot at night and would wake up feeling tired and occasionally have to nap during the day.  She also was waking up with HAs in the am. Her Stop Bang score was 5.  Due to her risk factors for obstructive sleep apnea and her CHF a home sleep study was ordered.  This demonstrated overall mild obstructive sleep apnea with an AHI of 10.4/h with no central events but during REM sleep she had moderate obstructive sleep apnea with a REM AHI of 22.2 /h.  She also had nocturnal hypoxemia with O2 sats less than 88% for 8.2 minutes.  She was started on auto CPAP from 4 to 15 cm H2O.  She is now referred for sleep medicine consultation to establish sleep care for treatment of her obstructive sleep apnea  She is  doing well with her PAP device and thinks that she has gotten used to it.  She tolerates the nasal pillow mask and feels the pressure is adequate.  Since going on PAP she feels rested in the am and has no significant daytime sleepiness. She no longer wakes up with HAs in the morning either.   She denies any significant \\nasal  dryness or nasal congestion. She has a lot of mouth dryness.   She does not think that she snores.     Past Medical History:  Diagnosis Date   Allergy    seasonal   Anemia    Arthritis    right hip   Gastritis    Headache    sinus/allergies   Hypertension    OSA on CPAP    overall mild obstructive sleep apnea with an AHI of 10.4/h with no central events but during REM sleep she had moderate obstructive sleep apnea with a REM AHI of 22.2 /h.  She also had nocturnal hypoxemia with O2 sats less than 88% for 8.2 minutes   Seasonal allergies    Vertigo    1-2x/month   Wears contact lenses    Past Surgical History:  Procedure Laterality Date   COLONOSCOPY N/A 05/03/2015   Procedure: COLONOSCOPY;  Surgeon: Midge Minium, MD;  Location: [facility];  Service: Gastroenterology;  Laterality: N/A;   COLONOSCOPY  COLONOSCOPY WITH PROPOFOL N/A 02/22/2021   Procedure: COLONOSCOPY WITH PROPOFOL;  Surgeon: Midge Minium, MD;  Location: [facility] ENDOSCOPY;  Service: Endoscopy;  Laterality: N/A;   ESOPHAGOGASTRODUODENOSCOPY N/A 05/03/2015   Procedure: ESOPHAGOGASTRODUODENOSCOPY (EGD);  Surgeon: Midge Minium, MD;  Location: [facility];  Service: Gastroenterology;  Laterality: N/A;   FOOT SURGERY  2014   Gastric bypass surgery     POLYPECTOMY  05/03/2015   Procedure: POLYPECTOMY INTESTINAL;  Surgeon: Midge Minium, MD;  Location: [facility];  Service: Gastroenterology;;   RIGHT HEART CATH N/A 12/06/2022   Procedure: RIGHT HEART CATH;  Surgeon: Dolores Patty, MD;  Location: [facility] INVASIVE CV LAB;  Service: Cardiovascular;  Laterality: N/A;   RIGHT/LEFT HEART  CATH AND CORONARY ANGIOGRAPHY N/A 12/13/2022   Procedure: RIGHT/LEFT HEART CATH AND CORONARY ANGIOGRAPHY;  Surgeon: Laurey Morale, MD;  Location: [facility] INVASIVE CV LAB;  Service: Cardiovascular;  Laterality: N/A;     No outpatient medications have been marked as taking for the 04/15/23 encounter (Video Visit) with Quintella Reichert, MD.     Allergies:   Bisoprolol, Penicillin g, and Penicillins   Social History   Tobacco Use   Smoking status: Never   Smokeless tobacco: Never   Tobacco comments:    Never smoke 12/20/22  Vaping Use   Vaping Use: Never used  Substance Use Topics   Alcohol use: No   Drug use: No     Family Hx: The patient's family history includes Cancer in her father; Diabetes in her father and mother; Heart failure in her mother; Heart failure (age of onset: 32) in her brother; Hypertension in her father and mother.  ROS:   Please see the history of present illness.     All other systems reviewed and are negative.   Prior Sleep studies:   The following studies were reviewed today:  Home sleep study and PAP compliance download  Labs/Other Tests and Data Reviewed:     Recent Labs: 11/30/2022: TSH 3.251 12/07/2022: ALT 73 12/13/2022: Magnesium 2.2 02/20/2023: Hemoglobin 12.4; Platelets 328 03/06/2023: B Natriuretic Peptide 128.9; BUN 12; Creatinine, Ser 0.97; Potassium 4.1; Sodium 137    Wt Readings from Last 3 Encounters:  04/15/23 254 lb 9.6 oz (115.5 kg)  04/04/23 258 lb (117 kg)  03/06/23 260 lb (117.9 kg)     Risk Assessment/Calculations:      STOP-Bang Score:  5      Objective:    Vital Signs:  BP 127/69   Pulse 88   Ht 5\' 5"  (1.651 m)   Wt 254 lb 9.6 oz (115.5 kg)   LMP 02/11/2017   BMI 42.37 kg/m    VITAL SIGNS:  reviewed GEN:  no acute distress EYES:  sclerae anicteric, EOMI - Extraocular Movements Intact RESPIRATORY:  normal respiratory effort, symmetric expansion CARDIOVASCULAR:  no peripheral edema SKIN:  no rash, lesions or  ulcers. MUSCULOSKELETAL:  no obvious deformities. NEURO:  alert and oriented x 3, no obvious focal deficit PSYCH:  normal affect  ASSESSMENT & PLAN:    OSA - The patient is tolerating PAP therapy well without any problems. The PAP download performed by his DME was personally reviewed and interpreted by me today and showed an AHI of 0.8 /hr on auto CPAP from 4-15 cm H2O with 90% compliance in using more than 4 hours nightly.  The patient has been using and benefiting from PAP use and will continue to benefit from therapy.  -she has a lot of mouth dryness and  I suspect that she is mouth breathing some.    Hypertension -BP is controlled at home -Continue prescription drug management with losartan 25 mg daily and spironolactone 25 mg daily as well as Toprol-XL 12.5 mg daily with as needed refills   Time:   Today, I have spent 15 minutes with the patient with telehealth technology discussing the above problems.     Medication Adjustments/Labs and Tests Ordered: Current medicines are reviewed at length with the patient today.  Concerns regarding medicines are outlined above.   Tests Ordered: No orders of the defined types were placed in this encounter.   Medication Changes: No orders of the defined types were placed in this encounter.   Follow Up:  In Person in 1 year(s)  Signed, Armanda Magic, MD  04/15/2023 9:33 AM    [facility]

## 2023-04-15 NOTE — Patient Instructions (Signed)
Medication Instructions:  Your physician recommends that you continue on your current medications as directed. Please refer to the Current Medication list given to you today.  *If you need a refill on your cardiac medications before your next appointment, please call your pharmacy*  Follow-Up: At  HeartCare, you and your health needs are our priority.  As part of our continuing mission to provide you with exceptional heart care, we have created designated Provider Care Teams.  These Care Teams include your primary Cardiologist (physician) and Advanced Practice Providers (APPs -  Physician Assistants and Nurse Practitioners) who all work together to provide you with the care you need, when you need it.  Your next appointment:   1 year(s)  Provider:   Dr. Turner   

## 2023-04-25 ENCOUNTER — Telehealth: Payer: Self-pay | Admitting: Nurse Practitioner

## 2023-04-25 ENCOUNTER — Other Ambulatory Visit: Payer: Self-pay | Admitting: Nurse Practitioner

## 2023-04-25 DIAGNOSIS — I10 Essential (primary) hypertension: Secondary | ICD-10-CM

## 2023-04-25 DIAGNOSIS — K219 Gastro-esophageal reflux disease without esophagitis: Secondary | ICD-10-CM

## 2023-04-25 NOTE — Telephone Encounter (Signed)
Lvm and sent message to schedule appointment in order to continue refills-Toni

## 2023-04-25 NOTE — Telephone Encounter (Signed)
Pt cancel so many appt please review

## 2023-04-29 ENCOUNTER — Ambulatory Visit (HOSPITAL_COMMUNITY)
Admission: RE | Admit: 2023-04-29 | Discharge: 2023-04-29 | Disposition: A | Discharge status: Home / Self Care | Payer: 59 | Source: Ambulatory Visit | Attending: Cardiology | Admitting: Cardiology

## 2023-04-29 ENCOUNTER — Telehealth (HOSPITAL_COMMUNITY): Payer: Self-pay

## 2023-04-29 ENCOUNTER — Other Ambulatory Visit (HOSPITAL_COMMUNITY): Payer: Self-pay

## 2023-04-29 DIAGNOSIS — I5022 Chronic systolic (congestive) heart failure: Secondary | ICD-10-CM

## 2023-04-29 LAB — BASIC METABOLIC PANEL
Anion gap: 9 (ref 5–15)
BUN: 12 mg/dL (ref 6–20)
CO2: 28 mmol/L (ref 22–32)
Calcium: 9.4 mg/dL (ref 8.9–10.3)
Chloride: 100 mmol/L (ref 98–111)
Creatinine, Ser: 1.19 mg/dL — ABNORMAL HIGH (ref 0.44–1.00)
GFR, Estimated: 53 mL/min — ABNORMAL LOW (ref >60)
Glucose, Bld: 104 mg/dL — ABNORMAL HIGH (ref 70–99)
Potassium: 4.2 mmol/L (ref 3.5–5.1)
Sodium: 137 mmol/L (ref 135–145)

## 2023-04-29 LAB — BRAIN NATRIURETIC PEPTIDE: B Natriuretic Peptide: 173.8 pg/mL — ABNORMAL HIGH (ref 0.0–100.0)

## 2023-04-29 NOTE — Telephone Encounter (Signed)
Patient called and stated she had stopped her Jardiance and has now gained about 7 pounds over the course of the month. She was not sure if that was something to be concerned with. She denied any other symptoms - no shob, no swelling. Please advise if she needs to change anything.

## 2023-04-30 ENCOUNTER — Encounter: Payer: Self-pay | Admitting: Nurse Practitioner

## 2023-04-30 ENCOUNTER — Ambulatory Visit (INDEPENDENT_AMBULATORY_CARE_PROVIDER_SITE_OTHER): Payer: 59 | Admitting: Nurse Practitioner

## 2023-04-30 VITALS — BP 126/70 | HR 70 | Temp 98.2°F | Resp 16 | Ht 65.0 in | Wt 260.6 lb

## 2023-04-30 DIAGNOSIS — B351 Tinea unguium: Secondary | ICD-10-CM | POA: Diagnosis not present

## 2023-04-30 DIAGNOSIS — K219 Gastro-esophageal reflux disease without esophagitis: Secondary | ICD-10-CM

## 2023-04-30 DIAGNOSIS — R300 Dysuria: Secondary | ICD-10-CM

## 2023-04-30 DIAGNOSIS — L84 Corns and callosities: Secondary | ICD-10-CM

## 2023-04-30 DIAGNOSIS — Z0001 Encounter for general adult medical examination with abnormal findings: Secondary | ICD-10-CM | POA: Diagnosis not present

## 2023-04-30 DIAGNOSIS — Z23 Encounter for immunization: Secondary | ICD-10-CM | POA: Diagnosis not present

## 2023-04-30 DIAGNOSIS — Z1231 Encounter for screening mammogram for malignant neoplasm of breast: Secondary | ICD-10-CM

## 2023-04-30 MED ORDER — TETANUS-DIPHTH-ACELL PERTUSSIS 5-2.5-18.5 LF-MCG/0.5 IM SUSP
0.5000 mL | Freq: Once | INTRAMUSCULAR | 0 refills | Status: AC
Start: 2023-04-30 — End: 2023-04-30

## 2023-04-30 MED ORDER — PANTOPRAZOLE SODIUM 40 MG PO TBEC
40.0000 mg | DELAYED_RELEASE_TABLET | Freq: Two times a day (BID) | ORAL | 1 refills | Status: DC
Start: 2023-04-30 — End: 2023-08-19

## 2023-04-30 MED ORDER — ZOSTER VAC RECOMB ADJUVANTED 50 MCG/0.5ML IM SUSR
0.5000 mL | Freq: Once | INTRAMUSCULAR | 0 refills | Status: AC
Start: 2023-04-30 — End: 2023-04-30

## 2023-04-30 NOTE — Progress Notes (Signed)
[facility] [address] [place], [place] [address]  Internal MEDICINE  Office Visit Note  Patient Name: Stephanie Gates  [id number]  [id number]  Date of Service: 04/30/2023  Chief Complaint  Patient presents with   Hypertension   Annual Exam    HPI Stephanie Gates presents for an annual well visit and physical exam.  Well-appearing 57 y.o. female with CHF, hypertension, OSA on CPAP, osteoarthritis, prediabetes, vitamin D deficiency, and anemia.  Routine CRC screening: due in 2029 Routine mammogram: due now Pap smear: due October 2024 Eye exam done in October 2023 foot exam:done today Labs: deferred, has a pacemaker insertion in June and has had many labs done  New or worsening pain: none  Acid reflux not controlled, taking pantoprazole 40 mg daily.  Hospitalized from 12/24-1/5 and was diagnosed with systolic CHF, NSVT, severe MR, severe TR. Now seeing cardiology, has a left bundle branch block and is schedule for ICD placement.    Current Medication: Outpatient Encounter Medications as of 04/30/2023  Medication Sig   digoxin (LANOXIN) 0.125 MG tablet Take 1 tablet (0.125 mg total) by mouth daily.   furosemide (LASIX) 40 MG tablet Take 1 tablet (40 mg total) by mouth daily.   losartan (COZAAR) 25 MG tablet Take 1 tablet (25 mg total) by mouth daily.   metoprolol succinate (TOPROL XL) 25 MG 24 hr tablet Take 0.5 tablets (12.5 mg total) by mouth daily.   Multiple Vitamin (MULTIVITAMIN) capsule Take 1 capsule by mouth daily.   potassium chloride SA (KLOR-CON M) 20 MEQ tablet Take 1 tablet (20 mEq total) by mouth daily.   spironolactone (ALDACTONE) 25 MG tablet Take 1 tablet (25 mg total) by mouth daily.   [DISCONTINUED] fluconazole (DIFLUCAN) 150 MG tablet Take 1 tablet (150 mg total) by mouth daily.   [DISCONTINUED] hydrochlorothiazide (HYDRODIURIL) 12.5 MG tablet TAKE 1 TABLET(12.5 MG) BY MOUTH DAILY   [DISCONTINUED] pantoprazole (PROTONIX) 40 MG tablet TAKE 1 TABLET(40  MG) BY MOUTH DAILY   [DISCONTINUED] Tdap (BOOSTRIX) 5-2.5-18.5 LF-MCG/0.5 injection Inject 0.5 mLs into the muscle once.   [DISCONTINUED] Zoster Vaccine Adjuvanted [facility]) injection Inject 0.5 mLs into the muscle once.   pantoprazole (PROTONIX) 40 MG tablet Take 1 tablet (40 mg total) by mouth 2 (two) times daily.   Tdap (BOOSTRIX) 5-2.5-18.5 LF-MCG/0.5 injection Inject 0.5 mLs into the muscle once for 1 dose.   Zoster Vaccine Adjuvanted [facility]) injection Inject 0.5 mLs into the muscle once for 1 dose.   No facility-administered encounter medications on file as of 04/30/2023.    Surgical History: Past Surgical History:  Procedure Laterality Date   COLONOSCOPY N/A 05/03/2015   Procedure: COLONOSCOPY;  Surgeon: Midge Minium, MD;  Location: [facility];  Service: Gastroenterology;  Laterality: N/A;   COLONOSCOPY     COLONOSCOPY WITH PROPOFOL N/A 02/22/2021   Procedure: COLONOSCOPY WITH PROPOFOL;  Surgeon: Midge Minium, MD;  Location: [facility] ENDOSCOPY;  Service: Endoscopy;  Laterality: N/A;   ESOPHAGOGASTRODUODENOSCOPY N/A 05/03/2015   Procedure: ESOPHAGOGASTRODUODENOSCOPY (EGD);  Surgeon: Midge Minium, MD;  Location: [facility];  Service: Gastroenterology;  Laterality: N/A;   FOOT SURGERY  2014   Gastric bypass surgery     POLYPECTOMY  05/03/2015   Procedure: POLYPECTOMY INTESTINAL;  Surgeon: Midge Minium, MD;  Location: [facility] SURGERY [facility];  Service: Gastroenterology;;   RIGHT HEART CATH N/A 12/06/2022   Procedure: RIGHT HEART CATH;  Surgeon: Dolores Patty, MD;  Location: [facility] INVASIVE CV LAB;  Service: Cardiovascular;  Laterality: N/A;   RIGHT/LEFT  HEART CATH AND CORONARY ANGIOGRAPHY N/A 12/13/2022   Procedure: RIGHT/LEFT HEART CATH AND CORONARY ANGIOGRAPHY;  Surgeon: Laurey Morale, MD;  Location: [facility] INVASIVE CV LAB;  Service: Cardiovascular;  Laterality: N/A;    Medical History: Past Medical History:  Diagnosis Date   Allergy    seasonal   Anemia    Arthritis     right hip   Gastritis    Headache    sinus/allergies   Hypertension    OSA on CPAP    overall mild obstructive sleep apnea with an AHI of 10.4/h with no central events but during REM sleep Stephanie Gates had moderate obstructive sleep apnea with a REM AHI of 22.2 /h.  Stephanie Gates also had nocturnal hypoxemia with O2 sats less than 88% for 8.2 minutes   Seasonal allergies    Vertigo    1-2x/month   Wears contact lenses     Family History: Family History  Problem Relation Age of Onset   Heart failure Mother    Diabetes Mother    Hypertension Mother    Cancer Father    Diabetes Father    Hypertension Father    Heart failure Brother 59    Social History   Socioeconomic History   Marital status: Single    Spouse name: Not on file   Number of children: 1   Years of education: Not on file   Highest education level: Bachelor's degree (e.g., BA, AB, BS)  Occupational History   Occupation: Health visitor  Tobacco Use   Smoking status: Never   Smokeless tobacco: Never   Tobacco comments:    Never smoke 12/20/22  Vaping Use   Vaping Use: Never used  Substance and Sexual Activity   Alcohol use: No   Drug use: No   Sexual activity: Not Currently  Other Topics Concern   Not on file  Social History Narrative   Not on file   Social Determinants of Health   Financial Resource Strain: Low Risk  (12/05/2022)   Overall Financial Resource Strain (CARDIA)    Difficulty of Paying Living Expenses: Not very hard  Food Insecurity: No Food Insecurity (12/16/2022)   Hunger Vital Sign    Worried About Running Out of Food in the Last Year: Never true    Ran Out of Food in the Last Year: Never true  Transportation Needs: No Transportation Needs (12/05/2022)   PRAPARE - Administrator, Civil Service (Medical): No    Lack of Transportation (Non-Medical): No  Physical Activity: Not on file  Stress: Not on file  Social Connections: Not on file  Intimate Partner Violence: Not on file       Review of Systems  Constitutional:  Negative for activity change, appetite change, chills, fatigue, fever and unexpected weight change.  HENT: Negative.  Negative for congestion, ear pain, rhinorrhea, sore throat and trouble swallowing.   Eyes: Negative.   Respiratory:  Positive for shortness of breath. Negative for cough, chest tightness and wheezing.   Cardiovascular: Negative.  Negative for chest pain and palpitations.       Has intraventricular block, having pacemaker inserted in june  Gastrointestinal: Negative.  Negative for abdominal pain, blood in stool, constipation, diarrhea, nausea and vomiting.       Acid reflux not controlled  Endocrine: Negative.   Genitourinary: Negative.  Negative for difficulty urinating, dysuria, frequency, hematuria and urgency.  Musculoskeletal: Negative.  Negative for arthralgias, back pain, joint swelling, myalgias and neck pain.  Skin: Negative.  Negative for rash and wound.  Allergic/Immunologic: Negative.  Negative for immunocompromised state.  Neurological: Negative.  Negative for dizziness, seizures, numbness and headaches.  Hematological: Negative.   Psychiatric/Behavioral: Negative.  Negative for behavioral problems, self-injury and suicidal ideas. The patient is not nervous/anxious.     Vital Signs: BP 126/70   Pulse 70   Temp 98.2 F (36.8 C)   Resp 16   Ht 5\' 5"  (1.651 m)   Wt 260 lb 9.6 oz (118.2 kg)   LMP 02/11/2017   SpO2 99%   BMI 43.37 kg/m    Physical Exam Vitals reviewed.  Constitutional:      General: Stephanie Gates is awake. Stephanie Gates is not in acute distress.    Appearance: Normal appearance. Stephanie Gates is well-developed and well-groomed. Stephanie Gates is obese. Stephanie Gates is not ill-appearing or diaphoretic.  HENT:     Head: Normocephalic and atraumatic.     Right Ear: Tympanic membrane, ear canal and external ear normal.     Left Ear: Tympanic membrane, ear canal and external ear normal.     Nose: Nose normal. No congestion or rhinorrhea.      Mouth/Throat:     Mouth: Mucous membranes are moist.     Pharynx: Oropharynx is clear. No oropharyngeal exudate or posterior oropharyngeal erythema.  Eyes:     General: Lids are normal. Vision grossly intact. Gaze aligned appropriately. No scleral icterus.       Right eye: No discharge.        Left eye: No discharge.     Extraocular Movements: Extraocular movements intact.     Conjunctiva/sclera: Conjunctivae normal.     Pupils: Pupils are equal, round, and reactive to light.     Funduscopic exam:    Right eye: Red reflex present.        Left eye: Red reflex present. Neck:     Thyroid: No thyromegaly.     Vascular: No JVD.     Trachea: Trachea and phonation normal. No tracheal deviation.  Cardiovascular:     Rate and Rhythm: Normal rate. Rhythm irregular.     Pulses: Normal pulses.          Dorsalis pedis pulses are 2+ on the right side and 2+ on the left side.       Posterior tibial pulses are 2+ on the right side and 2+ on the left side.     Heart sounds: S1 normal and S2 normal. Murmur heard.     No friction rub. No gallop.  Pulmonary:     Effort: Pulmonary effort is normal. No accessory muscle usage or respiratory distress.     Breath sounds: Normal breath sounds and air entry. No stridor. No wheezing or rales.  Chest:     Chest wall: No tenderness.     Comments: Declined breast exam, gets annual mammograms.  Abdominal:     General: Bowel sounds are normal. There is no distension.     Palpations: Abdomen is soft. There is no shifting dullness, fluid wave, mass or pulsatile mass.     Tenderness: There is no abdominal tenderness. There is no guarding or rebound.  Musculoskeletal:        General: No tenderness or deformity. Normal range of motion.     Cervical back: Normal range of motion and neck supple.     Right lower leg: No edema.     Left lower leg: No edema.     Right foot: Normal range of motion. No deformity, bunion, Charcot foot,  foot drop or prominent metatarsal  heads.     Left foot: Normal range of motion. No deformity, bunion, Charcot foot, foot drop or prominent metatarsal heads.       Feet:  Feet:     Right foot:     Protective Sensation: 6 sites tested.  6 sites sensed.     Skin integrity: Callus and dry skin present. No ulcer, blister, skin breakdown, erythema, warmth or fissure.     Toenail Condition: Fungal disease present.    Left foot:     Protective Sensation: 6 sites tested.  6 sites sensed.     Skin integrity: Callus and dry skin present. No ulcer, blister, skin breakdown, erythema, warmth or fissure.     Toenail Condition: Fungal disease present.    Comments: Marked location is a corn Lymphadenopathy:     Cervical: No cervical adenopathy.  Skin:    General: Skin is warm and dry.     Capillary Refill: Capillary refill takes less than 2 seconds.     Coloration: Skin is not pale.     Findings: No erythema or rash.  Neurological:     Mental Status: Stephanie Gates is alert and oriented to person, place, and time.     Cranial Nerves: No cranial nerve deficit.     Motor: No abnormal muscle tone.     Coordination: Coordination normal.     Gait: Gait normal.     Deep Tendon Reflexes: Reflexes are normal and symmetric.  Psychiatric:        Mood and Affect: Mood and affect normal.        Behavior: Behavior normal. Behavior is cooperative.        Thought Content: Thought content normal.        Judgment: Judgment normal.        Assessment/Plan: 1. Encounter for routine adult health examination with abnormal findings Age-appropriate preventive screenings and vaccinations discussed, annual physical exam completed. Routine labs for health maintenance deferred. Stephanie Gates is getting a lot of labs done for her upcoming surgery. PHM updated.   2. Gastro-esophageal reflux disease without esophagitis Increased pantoprazole to twice daily. - pantoprazole (PROTONIX) 40 MG tablet; Take 1 tablet (40 mg total) by mouth 2 (two) times daily.  Dispense: 180  tablet; Refill: 1  3. Onychomycosis of toenail Referred to podiatry - Ambulatory referral to Podiatry  4. Corn of foot Referred to podiatry  - Ambulatory referral to Podiatry  5. Need for vaccination - Tdap (BOOSTRIX) 5-2.5-18.5 LF-MCG/0.5 injection; Inject 0.5 mLs into the muscle once for 1 dose.  Dispense: 0.5 mL; Refill: 0 - Zoster Vaccine Adjuvanted [facility]) injection; Inject 0.5 mLs into the muscle once for 1 dose.  Dispense: 0.5 mL; Refill: 0  6. Encounter for screening mammogram for malignant neoplasm of breast Routine mammogram ordered  - MM 3D SCREENING MAMMOGRAM BILATERAL BREAST; Future      General Counseling: Stephanie Gates verbalizes understanding of the findings of todays visit and agrees with plan of treatment. I have discussed any further diagnostic evaluation that may be needed or ordered today. We also reviewed her medications today. Stephanie Gates has been encouraged to call the office with any questions or concerns that should arise related to todays visit.    Orders Placed This Encounter  Procedures   MM 3D SCREENING MAMMOGRAM BILATERAL BREAST    Meds ordered this encounter  Medications   Tdap (BOOSTRIX) 5-2.5-18.5 LF-MCG/0.5 injection    Sig: Inject 0.5 mLs into the muscle once for 1 dose.  Dispense:  0.5 mL    Refill:  0   Zoster Vaccine Adjuvanted [facility]) injection    Sig: Inject 0.5 mLs into the muscle once for 1 dose.    Dispense:  0.5 mL    Refill:  0   pantoprazole (PROTONIX) 40 MG tablet    Sig: Take 1 tablet (40 mg total) by mouth 2 (two) times daily.    Dispense:  180 tablet    Refill:  1    Return in about 6 months (around 10/31/2023) for F/U and PAP smear with , Kalven Ganim PCP.   Total time spent:30 Minutes Time spent includes review of chart, medications, test results, and follow up plan with the patient.   [place] Controlled Substance Database was reviewed by me.  This patient was seen by Sallyanne Kuster, FNP-C in collaboration with Dr. Beverely Risen  as a part of collaborative care agreement.  Christina Waldrop R. Tedd Sias, MSN, FNP-C Internal medicine

## 2023-05-01 LAB — UA/M W/RFLX CULTURE, ROUTINE
Bilirubin, UA: NEGATIVE
Glucose, UA: NEGATIVE
Ketones, UA: NEGATIVE
Leukocytes,UA: NEGATIVE
Nitrite, UA: NEGATIVE
Protein,UA: NEGATIVE
RBC, UA: NEGATIVE
Specific Gravity, UA: 1.009 (ref 1.005–1.030)
Urobilinogen, Ur: 0.2 mg/dL (ref 0.2–1.0)
pH, UA: 5.5 (ref 5.0–7.5)

## 2023-05-01 LAB — MICROSCOPIC EXAMINATION
Bacteria, UA: NONE SEEN
Casts: NONE SEEN /lpf
Epithelial Cells (non renal): NONE SEEN /hpf (ref 0–10)
RBC, Urine: NONE SEEN /hpf (ref 0–2)
WBC, UA: NONE SEEN /hpf (ref 0–5)

## 2023-05-05 ENCOUNTER — Encounter: Payer: Self-pay | Admitting: Nurse Practitioner

## 2023-05-06 ENCOUNTER — Ambulatory Visit (HOSPITAL_COMMUNITY)
Admission: RE | Admit: 2023-05-06 | Discharge: 2023-05-06 | Disposition: A | Discharge status: Home / Self Care | Payer: 59 | Source: Ambulatory Visit | Attending: Internal Medicine | Admitting: Internal Medicine

## 2023-05-06 ENCOUNTER — Encounter (HOSPITAL_COMMUNITY): Payer: Self-pay

## 2023-05-06 VITALS — BP 120/68 | HR 83 | Wt 259.2 lb

## 2023-05-06 DIAGNOSIS — I447 Left bundle-branch block, unspecified: Secondary | ICD-10-CM | POA: Diagnosis not present

## 2023-05-06 DIAGNOSIS — I071 Rheumatic tricuspid insufficiency: Secondary | ICD-10-CM

## 2023-05-06 DIAGNOSIS — I081 Rheumatic disorders of both mitral and tricuspid valves: Secondary | ICD-10-CM | POA: Insufficient documentation

## 2023-05-06 DIAGNOSIS — I10 Essential (primary) hypertension: Secondary | ICD-10-CM

## 2023-05-06 DIAGNOSIS — Z6841 Body Mass Index (BMI) 40.0 and over, adult: Secondary | ICD-10-CM | POA: Diagnosis not present

## 2023-05-06 DIAGNOSIS — I340 Nonrheumatic mitral (valve) insufficiency: Secondary | ICD-10-CM

## 2023-05-06 DIAGNOSIS — Z79899 Other long term (current) drug therapy: Secondary | ICD-10-CM | POA: Diagnosis not present

## 2023-05-06 DIAGNOSIS — R7303 Prediabetes: Secondary | ICD-10-CM | POA: Insufficient documentation

## 2023-05-06 DIAGNOSIS — E669 Obesity, unspecified: Secondary | ICD-10-CM

## 2023-05-06 DIAGNOSIS — Z87440 Personal history of urinary (tract) infections: Secondary | ICD-10-CM | POA: Diagnosis not present

## 2023-05-06 DIAGNOSIS — Z8249 Family history of ischemic heart disease and other diseases of the circulatory system: Secondary | ICD-10-CM | POA: Diagnosis not present

## 2023-05-06 DIAGNOSIS — I428 Other cardiomyopathies: Secondary | ICD-10-CM | POA: Insufficient documentation

## 2023-05-06 DIAGNOSIS — I5022 Chronic systolic (congestive) heart failure: Secondary | ICD-10-CM | POA: Insufficient documentation

## 2023-05-06 DIAGNOSIS — Z833 Family history of diabetes mellitus: Secondary | ICD-10-CM | POA: Diagnosis not present

## 2023-05-06 DIAGNOSIS — G4733 Obstructive sleep apnea (adult) (pediatric): Secondary | ICD-10-CM | POA: Diagnosis not present

## 2023-05-06 DIAGNOSIS — Z9884 Bariatric surgery status: Secondary | ICD-10-CM | POA: Insufficient documentation

## 2023-05-06 DIAGNOSIS — I110 Hypertensive heart disease with heart failure: Secondary | ICD-10-CM | POA: Insufficient documentation

## 2023-05-06 LAB — BRAIN NATRIURETIC PEPTIDE: B Natriuretic Peptide: 220.9 pg/mL — ABNORMAL HIGH (ref 0.0–100.0)

## 2023-05-06 LAB — CBC
HCT: 39.3 % (ref 36.0–46.0)
Hemoglobin: 12.4 g/dL (ref 12.0–15.0)
MCH: 25.7 pg — ABNORMAL LOW (ref 26.0–34.0)
MCHC: 31.6 g/dL (ref 30.0–36.0)
MCV: 81.5 fL (ref 80.0–100.0)
Platelets: 239 10*3/uL (ref 150–400)
RBC: 4.82 MIL/uL (ref 3.87–5.11)
RDW: 16.2 % — ABNORMAL HIGH (ref 11.5–15.5)
WBC: 8.1 10*3/uL (ref 4.0–10.5)
nRBC: 0 % (ref 0.0–0.2)

## 2023-05-06 NOTE — Patient Instructions (Addendum)
Thank you for coming in today  If you had labs drawn today, any labs that are abnormal the clinic will call you No news is good news  Medications: You have been referred to Pharmacy for GLP1 their office will contact you  Follow up appointments:  Your physician recommends that you schedule a follow-up appointment in:  3 months With Dr. Gala Romney with echocardiogram  Your physician has requested that you have an echocardiogram. Echocardiography is a painless test that uses sound waves to create images of your heart. It provides your doctor with information about the size and shape of your heart and how well your heart's chambers and valves are working. This procedure takes approximately one hour. There are no restrictions for this procedure.       Do the following things EVERYDAY: Weigh yourself in the morning before breakfast. Write it down and keep it in a log. Take your medicines as prescribed Eat low salt foods--Limit salt (sodium) to 2000 mg per day.  Stay as active as you can everyday Limit all fluids for the day to less than 2 liters   At the [facility], you and your health needs are our priority. As part of our continuing mission to provide you with exceptional heart care, we have created designated Provider Care Teams. These Care Teams include your primary Cardiologist (physician) and Advanced Practice Providers (APPs- Physician Assistants and Nurse Practitioners) who all work together to provide you with the care you need, when you need it.   You may see any of the following providers on your designated Care Team at your next follow up: Dr Arvilla Meres Dr Marca Ancona Dr. Marcos Eke, NP Robbie Lis, [place] [facility] [place], [place] Brynda Peon, NP Karle Plumber, PharmD   Please be sure to bring in all your medications bottles to every appointment.    Thank you for choosing [facility]  If you have any questions or concerns before your next appointment please send [place] a message through [place] or call our office at [phone].    TO LEAVE A MESSAGE FOR THE NURSE SELECT OPTION 2, PLEASE LEAVE A MESSAGE INCLUDING: YOUR NAME DATE OF BIRTH CALL BACK NUMBER REASON FOR CALL**this is important as we prioritize the call backs  YOU WILL RECEIVE A CALL BACK THE SAME DAY AS LONG AS YOU CALL BEFORE 4:00 PM

## 2023-05-06 NOTE — Progress Notes (Addendum)
ADVANCED HF CLINIC NOTE   PCP: She is establishing at [facility]  Primary HF Cardiologist: Dr Gala Romney   HPI [name] is a 57 y/o woman with HTN, morbid obesity s/p bariatric surgery, LBBB (130-140 ms), IDA, LBBB, OSA, and chronic HFrEF.  She had sleep study about 1 year ago and was told she had sleep apnea but she did not follow up with CPAP.   Admitted 12/23 with acute HF symptoms. Echo EF 20% with dyssynchrony, RV moderately down. Moderate to severe MR. Had CT chest. No PE. Subsequently developed AKI and inability to diurese. Advanced HF team consulted. Underwent L/RHC which showed markedly elevated filling pressures, low output with RV > LV failure, and normal coronaries.   Echo 03/06/23 EF 20% septal dyssynergy   Today she returns for AHF follow up. Overall feeling good. Denies palpitations, CP, dizziness, or PND/Orthopnea. Slight swelling around ankles (chronic). No SOB. Appetite ok, has been dealing with diarrhea for about a week. No fever or chills. Weight at home 257 pounds. Taking all medications. Denies ETOH, smoking. Works at [employer] from home. Scheduled for CRT-D placement 06/03/23.  2D Echo 12/02/22 LV < 20% global HK, grade IIDD. RV mildly reduced RA/LA mildly dilated. . Functional mitral regurgitation due to left ventricular disfunction and annular dilatation. The mitral valve is degenerative. Moderate to severe mitral valve regurgitation. No evidence of mitral stenosis. Tricuspid valve regurgitation is severe. T  RHC 12/06/22 RA = 22 RV = 49/22 PA = 49/26 (33) PCW = 23  Fick cardiac output/index = 6.1/2.6 TD CO/CI = 4.1/1.7 PVR = 1.6 (Fick) 2.5 (TD) Ao sat = 99% PA sat = 64%, 66% SVC sat = 65% PaPI = 1.0    R/LHC 12/13/22   RA mean 4 RV 41/8 PA 32/16, mean 23 PCWP mean 8 LV 112/16 AO 113/57 Oxygen saturations: PA 70% AO 98% Cardiac Output (Fick) 6.72  Cardiac Index (Fick) 3.04 Cardiac Output (Thermo) 5.90 Cardiac Index (Thermo) 2.67 PVR 2.5  WU PAPi 4  ROS: All systems negative except as listed in HPI, PMH and Problem List.  SH:  Social History   Socioeconomic History   Marital status: Single    Spouse name: Not on file   Number of children: 1   Years of education: Not on file   Highest education level: Bachelor's degree (e.g., BA, AB, BS)  Occupational History   Occupation: Health visitor  Tobacco Use   Smoking status: Never   Smokeless tobacco: Never   Tobacco comments:    Never smoke 12/20/22  Vaping Use   Vaping Use: Never used  Substance and Sexual Activity   Alcohol use: No   Drug use: No   Sexual activity: Not Currently  Other Topics Concern   Not on file  Social History Narrative   Not on file   Social Determinants of Health   Financial Resource Strain: Low Risk  (12/05/2022)   Overall Financial Resource Strain (CARDIA)    Difficulty of Paying Living Expenses: Not very hard  Food Insecurity: No Food Insecurity (12/16/2022)   Hunger Vital Sign    Worried About Running Out of Food in the Last Year: Never true    Ran Out of Food in the Last Year: Never true  Transportation Needs: No Transportation Needs (12/05/2022)   PRAPARE - Administrator, Civil Service (Medical): No    Lack of Transportation (Non-Medical): No  Physical Activity: Not on file  Stress: Not on file  Social Connections: Not  on file  Intimate Partner Violence: Not on file   FH:  Family History  Problem Relation Age of Onset   Heart failure Mother    Diabetes Mother    Hypertension Mother    Cancer Father    Diabetes Father    Hypertension Father    Heart failure Brother 45   Past Medical History:  Diagnosis Date   Allergy    seasonal   Anemia    Arthritis    right hip   Gastritis    Headache    sinus/allergies   Hypertension    OSA on CPAP    overall mild obstructive sleep apnea with an AHI of 10.4/h with no central events but during REM sleep she had moderate obstructive sleep apnea with a REM  AHI of 22.2 /h.  She also had nocturnal hypoxemia with O2 sats less than 88% for 8.2 minutes   Seasonal allergies    Vertigo    1-2x/month   Wears contact lenses    Current Outpatient Medications  Medication Sig Dispense Refill   digoxin (LANOXIN) 0.125 MG tablet Take 1 tablet (0.125 mg total) by mouth daily. 30 tablet 5   furosemide (LASIX) 40 MG tablet Take 1 tablet (40 mg total) by mouth daily. 30 tablet 5   losartan (COZAAR) 25 MG tablet Take 1 tablet (25 mg total) by mouth daily. 90 tablet 3   Metoprolol Succinate (TOPROL XL PO) Take 25 mg by mouth daily.     Multiple Vitamin (MULTIVITAMIN) capsule Take 1 capsule by mouth daily.     pantoprazole (PROTONIX) 40 MG tablet Take 1 tablet (40 mg total) by mouth 2 (two) times daily. 180 tablet 1   potassium chloride SA (KLOR-CON M) 20 MEQ tablet Take 1 tablet (20 mEq total) by mouth daily. 30 tablet 5   spironolactone (ALDACTONE) 25 MG tablet Take 1 tablet (25 mg total) by mouth daily. 30 tablet 5   No current facility-administered medications for this encounter.   Vitals:   05/06/23 1514  BP: 120/68  Pulse: 83  SpO2: 98%  Weight: 117.6 kg (259 lb 3.2 oz)   Wt Readings from Last 3 Encounters:  05/06/23 117.6 kg (259 lb 3.2 oz)  04/30/23 118.2 kg (260 lb 9.6 oz)  04/15/23 115.5 kg (254 lb 9.6 oz)   PHYSICAL EXAM: General:  well appearing.  No respiratory difficulty. Walked into clinic HEENT: normal Neck: supple. JVD ~7 cm. Carotids 2+ bilat; no bruits. No lymphadenopathy or thyromegaly appreciated. Cor: PMI nondisplaced. Regular rate & rhythm. No rubs, gallops or murmurs. Lungs: clear Abdomen: soft, nontender, nondistended. No hepatosplenomegaly. No bruits or masses. Good bowel sounds. Extremities: no cyanosis, clubbing, rash, trace ankle edema  Neuro: alert & oriented x 3, cranial nerves grossly intact. moves all 4 extremities w/o difficulty. Affect pleasant.   ECG: NSR 78 bpm, LBBB (Personally reviewed)    ReDs:  34%  ASSESSMENT & PLAN: 1. Chronic HFrEF, NICM  - Developed acute HF 11/2022  - Echo 12/23 EF < 20%, severe LV dilation, mildly decreased RV systolic function, moderate-severe functional MR, severe TR.  - Cath 12/23 with elevated filling pressures. Normal cors - Uncertain cause of cardiomyopathy, ?LBBB vs HTN vs viral myocarditis.  LBBB is not markedly wide but significant dyssynchrony on echo. No family history of CHF.  - NYHA III. Volume status stable. Continue lasix 60 mg daily, increased from 40 when Jardiance stopped. + PRN for weight gain or BLE swelling.  - Continue digoxin 0.125  mg daily, check dig level at next visit already took this morning.  - Continue spironolactone 25 mg daily  - Off jardiance with recent UTI. Can consider Marcelline Deist.  - Continue losartan 25  - Continue Toprol 25 mg daily - Suspect LBBB CM - Proceed with cMRI, scheduled   2. 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  Follow with diuresis and GDMT (repeat echo in 3 months).   3. 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   - QRS on EKG 146 ms today - Scheduled for CRT-D placement 06/03/23 - BMET/CBC today  4. HTN:  - Stable today.    5. OSA - Now on CPAP. continue  6. Obesity. - H/O Bariatric Surgery - Body mass index is 43.13 kg/m. - Continue weight loss efforts - Refer to PharmD for GLP1RA, will check Hgb A1c with pre-diabetes  Follow up in 3 months with Dr. Gala Romney + echo  Alen Bleacher, NP  3:31 PM

## 2023-05-06 NOTE — Progress Notes (Signed)
ReDS Vest / Clip - 05/06/23 1514       ReDS Vest / Clip   Station Marker B    Ruler Value 32    ReDS Value Range Low volume    ReDS Actual Value 34

## 2023-05-07 LAB — HEMOGLOBIN A1C
Hgb A1c MFr Bld: 6.1 % — ABNORMAL HIGH (ref 4.8–5.6)
Mean Plasma Glucose: 128 mg/dL

## 2023-05-08 ENCOUNTER — Telehealth: Payer: Self-pay

## 2023-05-09 NOTE — Telephone Encounter (Signed)
Pt advised that go back to once a day and also follow diet if she not feeling better call us back

## 2023-05-13 ENCOUNTER — Ambulatory Visit: Payer: 59 | Admitting: Podiatry

## 2023-05-13 ENCOUNTER — Telehealth (HOSPITAL_COMMUNITY): Payer: Self-pay | Admitting: *Deleted

## 2023-05-13 ENCOUNTER — Encounter: Payer: Self-pay | Admitting: Podiatry

## 2023-05-13 VITALS — BP 124/58 | HR 64

## 2023-05-13 DIAGNOSIS — D2372 Other benign neoplasm of skin of left lower limb, including hip: Secondary | ICD-10-CM | POA: Diagnosis not present

## 2023-05-13 NOTE — Progress Notes (Signed)
Chief Complaint  Patient presents with   Callouses    "I have a corn on the bottom of my left foot." N - corn L - left plantar lateral side D - two - three weeks O - gradually worse C - sore   Nail Problem    "I also think I'm getting fungus in my toenails." N - fungus L - bilateral D - 2 mos. O - gradually C - dark and thick A - none T - none    Subjective: 57 y.o. female presenting to the office today as a new patient for evaluation of 2 separate complaints to the feet.  First the patient states that she has developed a symptomatic skin lesion to the plantar aspect of the left foot.  Onset over 1 month ago.  Denies a history of injury.  She does admit to walking around the house barefoot.  She works from home. Patient also states that for the past few years she has developed thick discoloration to the second digit toenails bilaterally.  Gradual onset.  She is concerned for possible toenail fungus.  Presents for further treatment evaluation   Past Medical History:  Diagnosis Date   Allergy    seasonal   Anemia    Arthritis    right hip   Gastritis    Headache    sinus/allergies   Hypertension    OSA on CPAP    overall mild obstructive sleep apnea with an AHI of 10.4/h with no central events but during REM sleep she had moderate obstructive sleep apnea with a REM AHI of 22.2 /h.  She also had nocturnal hypoxemia with O2 sats less than 88% for 8.2 minutes   Seasonal allergies    Vertigo    1-2x/month   Wears contact lenses     Past Surgical History:  Procedure Laterality Date   COLONOSCOPY N/A 05/03/2015   Procedure: COLONOSCOPY;  Surgeon: Midge Minium, MD;  Location: [facility];  Service: Gastroenterology;  Laterality: N/A;   COLONOSCOPY     COLONOSCOPY WITH PROPOFOL N/A 02/22/2021   Procedure: COLONOSCOPY WITH PROPOFOL;  Surgeon: Midge Minium, MD;  Location: [facility] ENDOSCOPY;  Service: Endoscopy;  Laterality: N/A;   ESOPHAGOGASTRODUODENOSCOPY N/A 05/03/2015    Procedure: ESOPHAGOGASTRODUODENOSCOPY (EGD);  Surgeon: Midge Minium, MD;  Location: [facility];  Service: Gastroenterology;  Laterality: N/A;   FOOT SURGERY  2014   Gastric bypass surgery     POLYPECTOMY  05/03/2015   Procedure: POLYPECTOMY INTESTINAL;  Surgeon: Midge Minium, MD;  Location: [facility];  Service: Gastroenterology;;   RIGHT HEART CATH N/A 12/06/2022   Procedure: RIGHT HEART CATH;  Surgeon: Dolores Patty, MD;  Location: [facility] INVASIVE CV LAB;  Service: Cardiovascular;  Laterality: N/A;   RIGHT/LEFT HEART CATH AND CORONARY ANGIOGRAPHY N/A 12/13/2022   Procedure: RIGHT/LEFT HEART CATH AND CORONARY ANGIOGRAPHY;  Surgeon: Laurey Morale, MD;  Location: [facility] INVASIVE CV LAB;  Service: Cardiovascular;  Laterality: N/A;    Allergies  Allergen Reactions   Bisoprolol Diarrhea   Penicillin G Itching   Penicillins Itching     Objective:  Physical Exam General: Alert and oriented x3 in no acute distress  Dermatology: Hyperkeratotic lesion(s) present on the plantar aspect of the left foot. Pain on palpation with a central nucleated core noted. Skin is warm, dry and supple bilateral lower extremities. Negative for open lesions or macerations. There is also some hyperkeratosis of the nail plate with dystrophy to the second digit bilateral.  Likely consistent with the elongated toe and persistent microtrauma that the toe is experiencing close toed shoes.  Clinically this does not appear to be fungal related  Vascular: Palpable pedal pulses bilaterally. No edema or erythema noted. Capillary refill within normal limits.  Neurological: Grossly tact via light touch  Musculoskeletal Exam: Pain on palpation at the keratotic lesion(s) noted. Range of motion within normal limits bilateral. Muscle strength 5/5 in all groups bilateral.  Assessment: 1.  Symptomatic benign skin lesion 2.  Dystrophic nail second digit bilateral secondary to persistent microtrauma   Plan of  Care:  1. Patient evaluated 2. Excisional debridement of keratoic lesion(s) using a chisel blade was performed without incident.  Salicylic acid applied 3. Dressed area with light dressing. 4.  Advised going barefoot 5.  Explained to the patient that the thickening of the toenail specifically to the second digit bilaterally is likely due to the fact that the second toes extend beyond the normal toe parabola creating excessive pressure microtrauma to these nail plates.  I do not believe it is fungal related. 6.  Recommend topical nail softener as needed 7.  Return to clinic as needed  *Works for [employer] from home  Felecia Shelling, DPM [facility]  Dr. Felecia Shelling, DPM    [address] [place], [place] [address]                Office [phone]  Fax [phone]

## 2023-05-22 ENCOUNTER — Telehealth (HOSPITAL_COMMUNITY): Payer: Self-pay | Admitting: Emergency Medicine

## 2023-05-22 NOTE — Telephone Encounter (Signed)
Reaching out to patient to offer assistance regarding upcoming cardiac imaging study; pt verbalizes understanding of appt date/time, parking situation and where to check in, pre-test NPO status and medications ordered, and verified current allergies; name and call back number provided for further questions should they arise Mariah Gerstenberger RN Navigator Cardiac Imaging [facility] [phone] office [phone] cell 

## 2023-05-22 NOTE — Telephone Encounter (Signed)
Attempted to call patient regarding upcoming cardiac MR appointment. Left message on voicemail with name and callback number Rolla Kedzierski RN Navigator Cardiac Imaging [facility] [phone] Office [phone] Cell  

## 2023-05-23 ENCOUNTER — Other Ambulatory Visit (HOSPITAL_COMMUNITY): Payer: Self-pay | Admitting: Internal Medicine

## 2023-05-23 ENCOUNTER — Ambulatory Visit (HOSPITAL_COMMUNITY)
Admission: RE | Admit: 2023-05-23 | Discharge: 2023-05-23 | Disposition: A | Discharge status: Home / Self Care | Payer: 59 | Source: Ambulatory Visit | Attending: Cardiology | Admitting: Cardiology

## 2023-05-23 DIAGNOSIS — I5022 Chronic systolic (congestive) heart failure: Secondary | ICD-10-CM | POA: Insufficient documentation

## 2023-05-23 MED ORDER — GADOBUTROL 1 MMOL/ML IV SOLN
14.0000 mL | Freq: Once | INTRAVENOUS | Status: AC | PRN
  Administered 2023-05-23: 14 mL via INTRAVENOUS

## 2023-05-31 ENCOUNTER — Other Ambulatory Visit (HOSPITAL_COMMUNITY): Payer: Self-pay | Admitting: Cardiology

## 2023-06-02 MED ORDER — VANCOMYCIN HCL 1500 MG/300ML IV SOLN
1500.0000 mg | INTRAVENOUS | Status: AC
  Administered 2023-06-03: 1500 mg via INTRAVENOUS
  Filled 2023-06-02 (×3): qty 300

## 2023-06-02 NOTE — Pre-Procedure Instructions (Signed)
Instructed patient on the following items: Arrival time 0515 Nothing to eat or drink after midnight No meds AM of procedure Responsible person to drive you home and stay with you for 24 hrs Wash with special soap night before and morning of procedure  

## 2023-06-03 ENCOUNTER — Ambulatory Visit (HOSPITAL_COMMUNITY): Payer: 59

## 2023-06-03 ENCOUNTER — Ambulatory Visit (HOSPITAL_COMMUNITY)
Admission: RE | Admit: 2023-06-03 | Discharge: 2023-06-03 | Disposition: A | Discharge status: Home / Self Care | Payer: 59 | Attending: Cardiology | Admitting: Cardiology

## 2023-06-03 ENCOUNTER — Other Ambulatory Visit: Payer: Self-pay

## 2023-06-03 ENCOUNTER — Encounter (HOSPITAL_COMMUNITY)
Admission: RE | Disposition: A | Discharge status: Home / Self Care | Payer: Self-pay | Source: Home / Self Care | Attending: Cardiology

## 2023-06-03 DIAGNOSIS — I447 Left bundle-branch block, unspecified: Secondary | ICD-10-CM | POA: Insufficient documentation

## 2023-06-03 DIAGNOSIS — I428 Other cardiomyopathies: Secondary | ICD-10-CM | POA: Diagnosis not present

## 2023-06-03 DIAGNOSIS — I429 Cardiomyopathy, unspecified: Secondary | ICD-10-CM | POA: Diagnosis not present

## 2023-06-03 DIAGNOSIS — I110 Hypertensive heart disease with heart failure: Secondary | ICD-10-CM | POA: Insufficient documentation

## 2023-06-03 DIAGNOSIS — Z6841 Body Mass Index (BMI) 40.0 and over, adult: Secondary | ICD-10-CM | POA: Diagnosis not present

## 2023-06-03 DIAGNOSIS — I5022 Chronic systolic (congestive) heart failure: Secondary | ICD-10-CM

## 2023-06-03 DIAGNOSIS — I340 Nonrheumatic mitral (valve) insufficiency: Secondary | ICD-10-CM

## 2023-06-03 DIAGNOSIS — G4733 Obstructive sleep apnea (adult) (pediatric): Secondary | ICD-10-CM | POA: Diagnosis not present

## 2023-06-03 HISTORY — PX: BIV ICD INSERTION CRT-D: EP1195

## 2023-06-03 LAB — BASIC METABOLIC PANEL
Anion gap: 8 (ref 5–15)
BUN: 14 mg/dL (ref 6–20)
CO2: 25 mmol/L (ref 22–32)
Calcium: 9.4 mg/dL (ref 8.9–10.3)
Chloride: 105 mmol/L (ref 98–111)
Creatinine, Ser: 0.98 mg/dL (ref 0.44–1.00)
GFR, Estimated: >60 mL/min (ref >60)
Glucose, Bld: 94 mg/dL (ref 70–99)
Potassium: 3.6 mmol/L (ref 3.5–5.1)
Sodium: 138 mmol/L (ref 135–145)

## 2023-06-03 SURGERY — BIV ICD INSERTION CRT-D

## 2023-06-03 MED ORDER — MIDAZOLAM HCL 5 MG/5ML IJ SOLN
INTRAMUSCULAR | Status: AC
  Filled 2023-06-03: qty 5

## 2023-06-03 MED ORDER — LIDOCAINE HCL (PF) 1 % IJ SOLN
INTRAMUSCULAR | Status: DC | PRN
  Administered 2023-06-03: 20 mL
  Administered 2023-06-03: 60 mL

## 2023-06-03 MED ORDER — SODIUM CHLORIDE 0.9 % IV SOLN: INTRAVENOUS | Status: DC

## 2023-06-03 MED ORDER — MIDAZOLAM HCL 5 MG/5ML IJ SOLN
INTRAMUSCULAR | Status: DC | PRN
  Administered 2023-06-03 (×5): 1 mg via INTRAVENOUS

## 2023-06-03 MED ORDER — FENTANYL CITRATE (PF) 100 MCG/2ML IJ SOLN
INTRAMUSCULAR | Status: DC | PRN
  Administered 2023-06-03 (×4): 25 ug via INTRAVENOUS

## 2023-06-03 MED ORDER — HEPARIN (PORCINE) IN NACL 1000-0.9 UT/500ML-% IV SOLN
INTRAVENOUS | Status: DC | PRN
  Administered 2023-06-03: 500 mL

## 2023-06-03 MED ORDER — SODIUM CHLORIDE 0.9 % IV SOLN: 80.0000 mg | INTRAVENOUS | Status: AC

## 2023-06-03 MED ORDER — ONDANSETRON HCL 4 MG/2ML IJ SOLN: 4.0000 mg | Freq: Four times a day (QID) | INTRAMUSCULAR | Status: DC | PRN

## 2023-06-03 MED ORDER — ACETAMINOPHEN 325 MG PO TABS: 325.0000 mg | ORAL_TABLET | ORAL | Status: DC | PRN

## 2023-06-03 MED ORDER — FENTANYL CITRATE (PF) 100 MCG/2ML IJ SOLN
INTRAMUSCULAR | Status: AC
  Filled 2023-06-03: qty 2

## 2023-06-03 MED ORDER — POVIDONE-IODINE 10 % EX SWAB
2.0000 | Freq: Once | CUTANEOUS | Status: AC
  Administered 2023-06-03: 2 via TOPICAL

## 2023-06-03 MED ORDER — LIDOCAINE HCL 1 % IJ SOLN
INTRAMUSCULAR | Status: AC
  Filled 2023-06-03: qty 20

## 2023-06-03 MED ORDER — IOHEXOL 350 MG/ML SOLN
INTRAVENOUS | Status: DC | PRN
  Administered 2023-06-03: 10 mL

## 2023-06-03 MED ORDER — CEFAZOLIN SODIUM-DEXTROSE 1-4 GM/50ML-% IV SOLN: 1.0000 g | Freq: Four times a day (QID) | INTRAVENOUS | Status: DC

## 2023-06-03 MED ORDER — CHLORHEXIDINE GLUCONATE 4 % EX SOLN
4.0000 | Freq: Once | CUTANEOUS | Status: DC
  Filled 2023-06-03: qty 60

## 2023-06-03 MED ORDER — LIDOCAINE HCL (PF) 1 % IJ SOLN
INTRAMUSCULAR | Status: AC
  Filled 2023-06-03: qty 60

## 2023-06-03 MED ORDER — VANCOMYCIN HCL IN DEXTROSE 1-5 GM/200ML-% IV SOLN
INTRAVENOUS | Status: AC
  Filled 2023-06-03: qty 200

## 2023-06-03 MED ORDER — LIDOCAINE HCL (PF) 1 % IJ SOLN
INTRAMUSCULAR | Status: AC
  Filled 2023-06-03: qty 30

## 2023-06-03 MED ORDER — TRAMADOL HCL 50 MG PO TABS
50.0000 mg | ORAL_TABLET | Freq: Once | ORAL | Status: AC
  Administered 2023-06-03: 50 mg via ORAL
  Filled 2023-06-03 (×2): qty 1

## 2023-06-03 MED ORDER — SODIUM CHLORIDE 0.9 % IV SOLN
INTRAVENOUS | Status: AC
  Administered 2023-06-03: 80 mg
  Filled 2023-06-03: qty 2

## 2023-06-03 SURGICAL SUPPLY — 20 items
BALLN ATTAIN 80 (BALLOONS) ×1
BALLOON ATTAIN 80 (BALLOONS) IMPLANT
CABLE SURGICAL S-101-97-12 (CABLE) ×1 IMPLANT
CATH ATTAIN COM SURV 6250V-EH (CATHETERS) IMPLANT
CATH ATTAIN COM SURV 6250V-MB2 (CATHETERS) IMPLANT
CATH ATTAIN SEL SURV 6248V-130 (CATHETERS) IMPLANT
CATH JOSEPH QUAD ALLRED 6F REP (CATHETERS) IMPLANT
ICD COBALT XT QUAD CRT DTPA2QQ (ICD Generator) IMPLANT
LEAD ATTAIN PERFORMA S 4598-88 (Lead) IMPLANT
LEAD CAPSURE NOVUS 5076-52CM (Lead) IMPLANT
LEAD SPRINT QUAT SEC 6935M-62 (Lead) IMPLANT
PAD DEFIB RADIO PHYSIO CONN (PAD) ×1 IMPLANT
SHEATH 7FR PRELUDE SNAP 13 (SHEATH) IMPLANT
SHEATH 9.5FR PRELUDE SNAP 13 (SHEATH) IMPLANT
SHEATH 9FR PRELUDE SNAP 13 (SHEATH) IMPLANT
SHEATH PROBE COVER 6X72 (BAG) IMPLANT
SLITTER 6232ADJ (MISCELLANEOUS) IMPLANT
TRAY PACEMAKER INSERTION (PACKS) ×1 IMPLANT
WIRE ACUITY WHISPER EDS 4648 (WIRE) IMPLANT
WIRE HI TORQ VERSACORE-J 145CM (WIRE) IMPLANT

## 2023-06-03 NOTE — H&P (Signed)
Electrophysiology Office Note   Date:  06/03/2023   ID:  Shriley, Joffe June 09, 1966, MRN [id number]  PCP:  Sallyanne Kuster, NP  Cardiologist:  Bensimhon Primary Electrophysiologist:  Emiya Loomer Jorja Loa, MD    Chief Complaint: CHF   History of Present Illness: Stephanie Gates is a 57 y.o. female who is being seen today for the evaluation of CHF at the request of No ref. provider found. Presenting today for electrophysiology evaluation.  She has a history seen for hypertension, morbid obesity post bariatric surgery, left bundle branch block, OSA, chronic systolic heart failure.  She was admitted to the hospital December 2023 with acute heart failure symptoms and was found to have an ejection fraction of 20%.  She had moderate to severe MR.  Left heart catheterization showed no evidence of coronary artery disease.  Since that time, she is continue to have fatigue.  She gets short of breath with mild activity.  She has no orthopnea or PND.  Today, denies symptoms of palpitations, chest pain, shortness of breath, orthopnea, PND, lower extremity edema, claudication, dizziness, presyncope, syncope, bleeding, or neurologic sequela. The patient is tolerating medications without difficulties. Plan ICD today.    Past Medical History:  Diagnosis Date   Allergy    seasonal   Anemia    Arthritis    right hip   Gastritis    Headache    sinus/allergies   Hypertension    OSA on CPAP    overall mild obstructive sleep apnea with an AHI of 10.4/h with no central events but during REM sleep she had moderate obstructive sleep apnea with a REM AHI of 22.2 /h.  She also had nocturnal hypoxemia with O2 sats less than 88% for 8.2 minutes   Seasonal allergies    Vertigo    1-2x/month   Wears contact lenses    Past Surgical History:  Procedure Laterality Date   COLONOSCOPY N/A 05/03/2015   Procedure: COLONOSCOPY;  Surgeon: Midge Minium, MD;  Location: [facility];  Service:  Gastroenterology;  Laterality: N/A;   COLONOSCOPY     COLONOSCOPY WITH PROPOFOL N/A 02/22/2021   Procedure: COLONOSCOPY WITH PROPOFOL;  Surgeon: Midge Minium, MD;  Location: [facility] ENDOSCOPY;  Service: Endoscopy;  Laterality: N/A;   ESOPHAGOGASTRODUODENOSCOPY N/A 05/03/2015   Procedure: ESOPHAGOGASTRODUODENOSCOPY (EGD);  Surgeon: Midge Minium, MD;  Location: [facility];  Service: Gastroenterology;  Laterality: N/A;   FOOT SURGERY  2014   Gastric bypass surgery     POLYPECTOMY  05/03/2015   Procedure: POLYPECTOMY INTESTINAL;  Surgeon: Midge Minium, MD;  Location: [facility];  Service: Gastroenterology;;   RIGHT HEART CATH N/A 12/06/2022   Procedure: RIGHT HEART CATH;  Surgeon: Dolores Patty, MD;  Location: [facility] INVASIVE CV LAB;  Service: Cardiovascular;  Laterality: N/A;   RIGHT/LEFT HEART CATH AND CORONARY ANGIOGRAPHY N/A 12/13/2022   Procedure: RIGHT/LEFT HEART CATH AND CORONARY ANGIOGRAPHY;  Surgeon: Laurey Morale, MD;  Location: [facility] INVASIVE CV LAB;  Service: Cardiovascular;  Laterality: N/A;     Current Facility-Administered Medications  Medication Dose Route Frequency Provider Last Rate Last Admin   0.9 %  sodium chloride infusion   Intravenous Continuous Regan Lemming, MD 50 mL/hr at 06/03/23 0635 New Bag at 06/03/23 0635   chlorhexidine (HIBICLENS) 4 % liquid 4 Application  4 Application Topical Once Kourtnei Rauber Daphine Deutscher, MD       gentamicin (GARAMYCIN) 80 mg in sodium chloride 0.9 % 500 mL irrigation  80 mg Irrigation  On Call Regan Lemming, MD       vancomycin (VANCOREADY) IVPB 1500 mg/300 mL  1,500 mg Intravenous On Call Regan Lemming, MD        Allergies:   Bisoprolol and Penicillins   Social History:  The patient  reports that she has never smoked. She has never used smokeless tobacco. She reports that she does not drink alcohol and does not use drugs.   Family History:  The patient's family history includes Cancer in her father; Diabetes in  her father and mother; Heart failure in her mother; Heart failure (age of onset: 64) in her brother; Hypertension in her father and mother.   ROS:  Please see the history of present illness.   Otherwise, review of systems is positive for none.   All other systems are reviewed and negative.   PHYSICAL EXAM: VS:  BP 135/60   Pulse 73   Temp 98.4 F (36.9 C) (Temporal)   Resp 16   Ht 5\' 5"  (1.651 m)   Wt 116.1 kg   LMP 02/11/2017   SpO2 96%   BMI 42.60 kg/m  , BMI Body mass index is 42.6 kg/m. GEN: Well nourished, well developed, in no acute distress  HEENT: normal  Neck: no JVD, carotid bruits, or masses Cardiac: RRR; no murmurs, rubs, or gallops,no edema  Respiratory:  clear to auscultation bilaterally, normal work of breathing GI: soft, nontender, nondistended, + BS MS: no deformity or atrophy  Skin: warm and dry Neuro:  Strength and sensation are intact Psych: euthymic mood, full affect  Recent Labs: 11/30/2022: TSH 3.251 12/07/2022: ALT 73 12/13/2022: Magnesium 2.2 04/29/2023: BUN 12; Creatinine, Ser 1.19; Potassium 4.2; Sodium 137 05/06/2023: B Natriuretic Peptide 220.9; Hemoglobin 12.4; Platelets 239    Lipid Panel     Component Value Date/Time   CHOL 94 12/03/2022 0137   CHOL 147 04/15/2022 0712   TRIG 64 12/03/2022 0137   HDL 33 (L) 12/03/2022 0137   HDL 52 04/15/2022 0712   CHOLHDL 2.8 12/03/2022 0137   VLDL 13 12/03/2022 0137   LDLCALC 48 12/03/2022 0137   LDLCALC 78 04/15/2022 0712     Wt Readings from Last 3 Encounters:  06/03/23 116.1 kg  05/06/23 117.6 kg  04/30/23 118.2 kg      Other studies Reviewed: Additional studies/ records that were reviewed today include: TTE 03/06/23  Review of the above records today demonstrates:   1. Left ventricular ejection fraction, by estimation, is 20 to 25%. The  left ventricle has severely decreased function. The left ventricle  demonstrates global hypokinesis. The left ventricular internal cavity size  was  moderately dilated. Left  ventricular diastolic parameters are consistent with Grade II diastolic  dysfunction (pseudonormalization).   2. Right ventricular systolic function is mildly reduced. The right  ventricular size is normal.   3. Left atrial size was moderately dilated.   4. Right atrial size was mildly dilated.   5. The mitral valve is normal in structure. Moderate mitral valve  regurgitation. No evidence of mitral stenosis.   6. The aortic valve is normal in structure. Aortic valve regurgitation is  not visualized. No aortic stenosis is present.   7. The inferior vena cava is normal in size with <50% respiratory  variability, suggesting right atrial pressure of 8 mmHg.   LHC 12/13/22 1. Normal coronaries, nonischemic cardiomyopathy.  2. Normal filling pressures.  3. Normal cardiac output.   ASSESSMENT AND PLAN:  1.  Chronic systolic heart failure:  ICD Criteria  Current LVEF:20-25%. Within 12 months prior to implant: Yes   Heart failure history: Yes, Class II  Cardiomyopathy history: Yes, Non-Ischemic Cardiomyopathy.  Atrial Fibrillation/Atrial Flutter: No.  Ventricular tachycardia history: No.  Cardiac arrest history: No.  History of syndromes with risk of sudden death: No.  Previous ICD: No.  Current ICD indication: Primary  PPM indication: No.  Class I or II Bradycardia indication present: No  Beta Blocker therapy for 3 or more months: Yes, prescribed.   Ace Inhibitor/ARB therapy for 3 or more months: Yes, prescribed.    I have seen Stephanie Gates is a 62 y.o. femalepre-procedural and has been referred by Bensimhon for consideration of ICD implant for primary prevention of sudden death.  The patient's chart has been reviewed and they meet criteria for ICD implant.  I have had a thorough discussion with the patient reviewing options.  The patient and their family (if available) have had opportunities to ask questions and have them answered. The  patient and I have decided together through the [facility] Share Decision Support Tool to implant ICD at this time.  Risks, benefits, alternatives to ICD implantation were discussed in detail with the patient today. The patient  understands that the risks include but are not limited to bleeding, infection, pneumothorax, perforation, tamponade, vascular damage, renal failure, MI, stroke, death, inappropriate shocks, and lead dislodgement and wishes to proceed.

## 2023-06-03 NOTE — Discharge Instructions (Addendum)
After Your ICD (Implantable Cardiac Defibrillator)   You have a Medtronic ICD  ACTIVITY Do not lift your arm above shoulder height for 1 week after your procedure. After 7 days, you may progress as below.  You should remove your sling 24 hours after your procedure, unless otherwise instructed by your provider.     Tuesday June 10, 2023  Wednesday June 11, 2023 Thursday June 12, 2023 Friday June 13, 2023   Do not lift, push, pull, or carry anything over 10 pounds with the affected arm until 6 weeks (Tuesday July 15, 2023 ) after your procedure.   You may drive AFTER your wound check, unless you have been told otherwise by your provider.   Ask your healthcare provider when you can go back to work   INCISION/Dressing   If large square, outer bandage is left in place, this can be removed after 24 hours from your procedure. Do not remove steri-strips or glue as below.   Monitor your defibrillator site for redness, swelling, and drainage. Call the device clinic at [phone] if you experience these symptoms or fever/chills.  If your incision is sealed with Steri-strips or staples, you may shower 7 days after your procedure or when told by your provider. Do not remove the steri-strips or let the shower hit directly on your site. You may wash around your site with soap and water.    If you were discharged in a sling, please do not wear this during the day more than 48 hours after your surgery unless otherwise instructed. This may increase the risk of stiffness and soreness in your shoulder.   Avoid lotions, ointments, or perfumes over your incision until it is well-healed.  You may use a hot tub or a pool AFTER your wound check appointment if the incision is completely closed.  Your ICD is designed to protect you from life threatening heart rhythms. Because of this, you may receive a shock.   1 shock with no symptoms:  Call the office during business hours. 1 shock with symptoms (chest  pain, chest pressure, dizziness, lightheadedness, shortness of breath, overall feeling unwell):  Call 911. If you experience 2 or more shocks in 24 hours:  Call 911. If you receive a shock, you should not drive for 6 months per the [place] DMV IF you receive appropriate therapy from your ICD.   ICD Alerts:  Some alerts are vibratory and others beep. These are NOT emergencies. Please call our office to let us know. If this occurs at night or on weekends, it can wait until the next business day. Send a remote transmission.  If your device is capable of reading fluid status (for heart failure), you will be offered monthly monitoring to review this with you.   DEVICE MANAGEMENT Remote monitoring is used to monitor your ICD from home. This monitoring is scheduled every 91 days by our office. It allows [place] to keep an eye on the functioning of your device to ensure it is working properly. You will routinely see your Electrophysiologist annually (more often if necessary).   You should receive your ID card for your new device in 4-8 weeks. Keep this card with you at all times once received. Consider wearing a medical alert bracelet or necklace.  Your ICD  may be MRI compatible. This will be discussed at your next office visit/wound check.  You should avoid contact with strong electric or magnetic fields.   Do not use amateur (ham) radio equipment or electric (  arc) welding torches. MP3 player headphones with magnets should not be used. Some devices are safe to use if held at least 12 inches (30 cm) from your defibrillator. These include power tools, lawn mowers, and speakers. If you are unsure if something is safe to use, ask your health care provider.  When using your cell phone, hold it to the ear that is on the opposite side from the defibrillator. Do not leave your cell phone in a pocket over the defibrillator.  You may safely use electric blankets, heating pads, computers, and microwave ovens.  Call the  office right away if: You have chest pain. You feel more than one shock. You feel more short of breath than you have felt before. You feel more light-headed than you have felt before. Your incision starts to open up.  This information is not intended to replace advice given to you by your health care provider. Make sure you discuss any questions you have with your health care provider.

## 2023-06-04 ENCOUNTER — Encounter (HOSPITAL_COMMUNITY): Payer: Self-pay | Admitting: Cardiology

## 2023-06-04 ENCOUNTER — Telehealth: Payer: Self-pay

## 2023-06-04 NOTE — Telephone Encounter (Signed)
-----   Message from Sheilah Pigeon, [place] sent at 06/03/2023  4:31 PM EDT ----- MDT CRT-D  Same day discharge today WC  No a/c

## 2023-06-04 NOTE — Telephone Encounter (Signed)
Follow-up after same day discharge: Implant date: 06/03/23 MD: Loman Brooklyn, MD Device: Medtronic [id number] Cobalt XT HF CRT-D MRI Location: Left Chest   Wound check visit: 06/18/23 @ 12:00 PM 90 day MD follow-up: 09/12/23 @ 3:30 PM  Remote Transmission received:Yes  Dressing/sling removed: Yes  Confirm OAC restart on: N/A

## 2023-06-05 ENCOUNTER — Telehealth: Payer: Self-pay | Admitting: Cardiology

## 2023-06-05 NOTE — Telephone Encounter (Signed)
    Patient called after hours number to discuss symptoms of itching/rash after ICD placement with Dr. Elberta Fortis on 6/25. Per patient, she awoke with itching of chest, arms, face, back and has developed small bumps in area of itching throughout the day. She denies any other symptoms including dyspnea, GI upset, tongue or face swelling. Discussed that this could be a reaction to chlorhexidine antiseptic. Given that there is no evidence of allergic reaction beyond dermatological involvement, I advised patient to take 2 25mg  of Benadryl this evening. Discussed warning signs of progressing reaction including swelling of face/tongue, GI upset, dyspnea and patient confirmed understanding. Advised her to call back if symptoms do not improve.  Perlie Gold, PA-C

## 2023-06-06 ENCOUNTER — Telehealth: Payer: Self-pay | Admitting: Cardiology

## 2023-06-06 ENCOUNTER — Encounter (HOSPITAL_BASED_OUTPATIENT_CLINIC_OR_DEPARTMENT_OTHER): Payer: Self-pay

## 2023-06-06 ENCOUNTER — Other Ambulatory Visit: Payer: Self-pay

## 2023-06-06 ENCOUNTER — Emergency Department (HOSPITAL_BASED_OUTPATIENT_CLINIC_OR_DEPARTMENT_OTHER)
Admission: EM | Admit: 2023-06-06 | Discharge: 2023-06-06 | Disposition: A | Discharge status: Home / Self Care | Payer: 59 | Attending: Emergency Medicine | Admitting: Emergency Medicine

## 2023-06-06 DIAGNOSIS — I5022 Chronic systolic (congestive) heart failure: Secondary | ICD-10-CM | POA: Diagnosis not present

## 2023-06-06 DIAGNOSIS — R21 Rash and other nonspecific skin eruption: Secondary | ICD-10-CM | POA: Diagnosis present

## 2023-06-06 DIAGNOSIS — Z79899 Other long term (current) drug therapy: Secondary | ICD-10-CM | POA: Insufficient documentation

## 2023-06-06 DIAGNOSIS — I110 Hypertensive heart disease with heart failure: Secondary | ICD-10-CM | POA: Insufficient documentation

## 2023-06-06 MED ORDER — FAMOTIDINE 20 MG PO TABS: 20.0000 mg | ORAL_TABLET | Freq: Two times a day (BID) | ORAL | 0 refills | Status: AC

## 2023-06-06 MED ORDER — PREDNISONE 50 MG PO TABS
60.0000 mg | ORAL_TABLET | Freq: Once | ORAL | Status: AC
  Administered 2023-06-06: 60 mg via ORAL
  Filled 2023-06-06: qty 1

## 2023-06-06 MED ORDER — FAMOTIDINE 20 MG PO TABS
20.0000 mg | ORAL_TABLET | Freq: Once | ORAL | Status: AC
  Administered 2023-06-06: 20 mg via ORAL
  Filled 2023-06-06: qty 1

## 2023-06-06 MED ORDER — PREDNISONE 10 MG PO TABS: 20.0000 mg | ORAL_TABLET | Freq: Every day | ORAL | 0 refills | Status: AC

## 2023-06-06 MED ORDER — DIPHENHYDRAMINE HCL 25 MG PO CAPS
25.0000 mg | ORAL_CAPSULE | Freq: Once | ORAL | Status: AC
  Administered 2023-06-06: 25 mg via ORAL
  Filled 2023-06-06: qty 1

## 2023-06-06 NOTE — Telephone Encounter (Signed)
Called patient back about message. Patient stated she has been having an itching rash all over since yesterday at  5:00 am. Patient stated it's on her back, legs, arms, arm pits, and torso. Patient stated she took benadryl as directed by Perlie Gold PA. Patient stated the benadryl has not gotten better and she even took two more doses this morning. Informed patient since her rash was not getting better and benadryl is not working, encouraged her to go to urgent care or ED to get her rash looked at and treated. Patient agreed to plan. Will send message to Dr. Elberta Fortis for advisement.

## 2023-06-06 NOTE — Telephone Encounter (Signed)
Pt called stating that she called the after hours line last night (6/27 note) and was advised to take Benadryl for itching and rash that has occurred. Pt states that the Benadryl seems to not be helping and would like to know what else she is able to take. Pt also wants to make provider aware of FMLA paperwork that she is going to fax to the office. Please advise

## 2023-06-06 NOTE — ED Provider Notes (Signed)
[place] [facility] Provider Note   CSN: [id number] Arrival date & time: 06/06/23  1612     History  Chief Complaint  Patient presents with   Rash    Raengel Colee is a 57 y.o. female with hypertension, morbid obesity post bariatric surgery, left bundle branch block, OSA, chronic systolic heart failure (LVEF 20%) w/ recent ICD placement on 06/02/23 who p/w rash. She states she had ICD placed on Tuesday and yesterday developed red, raised, pruritic rash all over her chest arms and thighs.  No history of similar.  Does have an allergy to penicillin but she states that they did not give her penicillin.  Otherwise she feels mild tenderness with patient over the surgical site but feels well.  Denies any nausea vomiting, diarrhea, fever/chills, chest pain, shortness of breath, lightheadedness/LOC, shortness of breath/wheezing/stridor.  Has tried Benadryl, topical Benadryl, and Aquaphor at home without any improvement of her symptoms. Has had no new medications since 06/02/23.    Rash      Home Medications Prior to Admission medications   Medication Sig Start Date End Date Taking? Authorizing Provider  famotidine (PEPCID) 20 MG tablet Take 1 tablet (20 mg total) by mouth 2 (two) times daily. 06/06/23  Yes Loetta Rough, MD  predniSONE (DELTASONE) 10 MG tablet Take 2 tablets (20 mg total) by mouth daily for 4 days. 06/06/23 06/10/23 Yes Loetta Rough, MD  acetaminophen (TYLENOL) 500 MG tablet Take 1,000 mg by mouth every 6 (six) hours as needed for moderate pain.    [provider]  Cyanocobalamin (B-12) 3000 MCG CAPS Take 3,000 mcg by mouth daily.    [provider]  digoxin (LANOXIN) 0.125 MG tablet Take 1 tablet (0.125 mg total) by mouth daily. 01/13/23   Laurey Morale, MD  furosemide (LASIX) 40 MG tablet TAKE 1 TABLET(40 MG) BY MOUTH DAILY 06/02/23   Laurey Morale, MD  losartan (COZAAR) 25 MG tablet Take 1 tablet (25 mg  total) by mouth daily. 02/10/23   Laurey Morale, MD  metoprolol succinate (TOPROL-XL) 25 MG 24 hr tablet Take 25 mg by mouth daily.    [provider]  Multiple Vitamin (MULTIVITAMIN) capsule Take 1 capsule by mouth daily.    [provider]  OVER THE COUNTER MEDICATION Take 2 capsules by mouth at bedtime. Beauty Sleep gummies    [provider]  pantoprazole (PROTONIX) 40 MG tablet Take 1 tablet (40 mg total) by mouth 2 (two) times daily. Patient taking differently: Take 40 mg by mouth daily as needed (acid reflux). 04/30/23   Sallyanne Kuster, NP  Polyvinyl Alcohol-Povidone (REFRESH OP) Place 1 drop into both eyes daily as needed (dry eyes).    [provider]  potassium chloride SA (KLOR-CON M) 20 MEQ tablet Take 1 tablet (20 mEq total) by mouth daily. 01/13/23   Laurey Morale, MD  spironolactone (ALDACTONE) 25 MG tablet Take 1 tablet (25 mg total) by mouth daily. 01/13/23   Laurey Morale, MD      Allergies    Bisoprolol and Penicillins    Review of Systems   Review of Systems  Skin:  Positive for rash.   A 10 point review of systems was performed and is negative unless otherwise reported in HPI.  Physical Exam Updated Vital Signs BP 117/66 (BP Location: Right Arm)   Pulse 73   Temp 97.8 F (36.6 C) (Oral)   Resp 16   Ht  5\' 5"  (1.651 m)   Wt 116 kg   LMP 02/11/2017   SpO2 100%   BMI 42.56 kg/m  Physical Exam General: Normal appearing female, lying in bed.  HEENT: Sclera anicteric, MMM, trachea midline.  Cardiology: RRR, no murmurs/rubs/gallops. BL radial and DP pulses equal bilaterally.  Resp: Normal respiratory rate and effort. CTAB, no wheezes, rhonchi, crackles.  Abd: Soft, non-tender, non-distended. No rebound tenderness or guarding.  GU: Deferred. MSK: No peripheral edema or signs of trauma. Extremities without deformity or TTP. No cyanosis or clubbing. Skin: warm, dry. Diffuse maculopapular rash, homogenous over chest,  shoulders, arms, and thighs. Nontender to palpation. Blanching. No bullae/vesicles, no petechiae/purpura. Area with steri-strips on L upper chest where ICD was placed is mildly TTP with no surrounding or different-appearing erythema/induration/fluctuance. No blistering under steri-strips.  Back: No CVA tenderness Neuro: A&Ox4, CNs II-XII grossly intact. MAEs. Sensation grossly intact.  Psych: Normal mood and affect.   ED Results / Procedures / Treatments   Labs (all labs ordered are listed, but only abnormal results are displayed) Labs Reviewed - No data to display  EKG None  Radiology No results found.  Procedures Procedures    Medications Ordered in ED Medications  predniSONE (DELTASONE) tablet 60 mg (60 mg Oral Given 06/06/23 1725)  famotidine (PEPCID) tablet 20 mg (20 mg Oral Given 06/06/23 1726)  diphenhydrAMINE (BENADRYL) capsule 25 mg (25 mg Oral Given 06/06/23 1726)    ED Course/ Medical Decision Making/ A&P                          Medical Decision Making   This patient presents to the ED for concern of diffuse maculopapular rash, this involves an extensive number of treatment options, and is a complaint that carries with it a high risk of complications and morbidity. Pt HDS, overall very well-appearing, otherwise feeling well, afebrile.  MDM:    Consider allergic dermatitis, drug rash highest on the differential given recent surgery. She has no raised patches to indicate urticaria. There is no blistering or increased redness under the teri-Strips that cover the incision site. No circumscribed areas of contact dermatitis. Likely her reaction is systemic. No sloughing or blisters/bullae to indicate SJS/TEN. No need for epinephrine at this time, no concern for anaphylaxis. No sore throat, fevers/chills, low c/f viral rash or strep. No mucosal lesions or hypotension, no c/f TSS. No immunocompromised state. No rash red flags. No new drugs no other associated LAD or CP/abd  pain/NV to indicate DRESS syndrome. Per chart review, patient did receive IV vancomycin 1,500 mg one dose on the day of surgery, but didn't develop symptoms until yesterday. For Red Man Syndrome, would expect flushing/rash response while vanco was infusing, not days later. Similarly also had gentamicin as irrigation but no symptoms locally, they are diffuse.   She is given Benadryl (last dose prior to arrival was 10 AM), Pepcid, and prednisone 60 mg p.o. for allergic reaction. She has no resp symptoms, vitally stable, otherwise at baseline. Advised f/u with allergist and dermatologist. Patient is given information for both and instructed to call on Monday morning. Given prednisone 20 mg x 4 more days, instructed to take pepcid BID and benadryl as needed. DC w/ discharge instructions/return precautions. All questions answered to patient's satisfaction.        Additional history obtained from chart review, daughter at bedside  Social Determinants of Health: Patient lives independently   Disposition:  DC  Co morbidities that  complicate the patient evaluation  Past Medical History:  Diagnosis Date   Allergy    seasonal   Anemia    Arthritis    right hip   Gastritis    Headache    sinus/allergies   Hypertension    OSA on CPAP    overall mild obstructive sleep apnea with an AHI of 10.4/h with no central events but during REM sleep she had moderate obstructive sleep apnea with a REM AHI of 22.2 /h.  She also had nocturnal hypoxemia with O2 sats less than 88% for 8.2 minutes   Seasonal allergies    Vertigo    1-2x/month   Wears contact lenses      Medicines Meds ordered this encounter  Medications   predniSONE (DELTASONE) tablet 60 mg   famotidine (PEPCID) tablet 20 mg   diphenhydrAMINE (BENADRYL) capsule 25 mg   famotidine (PEPCID) 20 MG tablet    Sig: Take 1 tablet (20 mg total) by mouth 2 (two) times daily.    Dispense:  30 tablet    Refill:  0   predniSONE (DELTASONE) 10 MG  tablet    Sig: Take 2 tablets (20 mg total) by mouth daily for 4 days.    Dispense:  8 tablet    Refill:  0    I have reviewed the patients home medicines and have made adjustments as needed  Problem List / ED Course: Problem List Items Addressed This Visit   None Visit Diagnoses     Maculopapular rash, generalized    -  Primary                   This note was created using dictation software, which may contain spelling or grammatical errors.    Loetta Rough, MD 06/06/23 [id number]

## 2023-06-06 NOTE — ED Triage Notes (Signed)
Patient had a pacemaker placed Tuesday. She started having a rash yesterday. The rash is all over her body and itches.

## 2023-06-06 NOTE — ED Notes (Signed)
Pt c/o rash and itching all over.  No new medications, no new lotions, soaps or detergents.  Had a PM placed on Tuesday, itching and rash started yesterday.

## 2023-06-06 NOTE — Discharge Instructions (Addendum)
Thank you for coming to [facility] Emergency Department. You were seen for rash. We did an exam, and this showed a diffuse maculopapular rash.  It is possibly due to an allergic reaction.  We have prescribed prednisone 20 mg to take once per day for 4 more days.  Please take Pepcid 20 mg twice per day as well.  We advise you to follow-up with allergy and immunology as well as dermatology.  You can call them to make appointment on Monday morning.  If your symptoms worsen please return to the emergency department. Please follow up with your primary care provider within 1 week.   Do not hesitate to return to the ED or call 911 if you experience: -Worsening symptoms -Chest pain, shortness of breath -Nausea vomiting -Wheezing, facial or tongue swelling, difficulty breathing -Fevers or chills -Peeling or blistering rash -Lightheadedness, passing out -Fevers/chills -Anything else that concerns you

## 2023-06-09 ENCOUNTER — Telehealth: Payer: Self-pay | Admitting: Cardiology

## 2023-06-09 NOTE — Telephone Encounter (Signed)
Spoke to the pt,  experienced  left shoulder pain since yesterday. Pt stated she is taken tylenol for pain, was seen [facility] ED at [facility] for Rash. Pt stated ED physician mentioned sending referral to allergy specialist. Explained to pt according to the discharge instructions:   Schedule an appointment with Sallyanne Kuster, NP (Nurse Practitioner) in 1 week (06/13/2023); For follow-up Call [facility] in 1 day (06/07/2023); Call [phone] to make an appointment, For follow-up, for allergy testing Call Dermatology, [facility] in 1 day (06/07/2023); To make an appointment, For follow-up Go to [facility] Emergency Department at [facility] (Emergency Medicine); As needed, If symptoms worsen   Pt voiced understanding, will forward to MD and nurse for advise.

## 2023-06-09 NOTE — Telephone Encounter (Signed)
Patient has procedure last weeks and now seems to have cramps in shoulder close to where the pacemaker was inserted. Requesting call back to discuss.

## 2023-06-09 NOTE — Telephone Encounter (Signed)
Please see ED note 06/06/23. Patient has apt. with allergist tomorrow @ 9:30 AM. ED precautions given in the meantime to patient with verbal understanding.   Patient advised to use tylenol and ice pack (with barrier between skin and ice pack) over device area to help reduce any swelling/pain. Advised patient to expect some soreness/tenderness for several weeks, but should improve overtime. Denies any s/s of infection. Patient advised to call with any changes that arise. Voiced understanding.

## 2023-06-10 ENCOUNTER — Encounter: Payer: Self-pay | Admitting: Internal Medicine

## 2023-06-10 ENCOUNTER — Other Ambulatory Visit: Payer: Self-pay

## 2023-06-10 ENCOUNTER — Ambulatory Visit (INDEPENDENT_AMBULATORY_CARE_PROVIDER_SITE_OTHER): Payer: 59 | Admitting: Internal Medicine

## 2023-06-10 VITALS — BP 124/72 | HR 60 | Temp 98.2°F | Resp 18 | Ht 64.17 in | Wt 261.0 lb

## 2023-06-10 DIAGNOSIS — R21 Rash and other nonspecific skin eruption: Secondary | ICD-10-CM | POA: Diagnosis not present

## 2023-06-10 DIAGNOSIS — L2389 Allergic contact dermatitis due to other agents: Secondary | ICD-10-CM

## 2023-06-10 MED ORDER — TRIAMCINOLONE ACETONIDE 0.1 % EX OINT: TOPICAL_OINTMENT | CUTANEOUS | 0 refills | Status: DC

## 2023-06-10 MED ORDER — HYDROCORTISONE 2.5 % EX CREA: TOPICAL_CREAM | Freq: Two times a day (BID) | CUTANEOUS | 0 refills | Status: DC

## 2023-06-10 NOTE — Progress Notes (Signed)
**Note Stephanie-Identified via Obfuscation** NEW PATIENT  Date of Service/Encounter:  06/10/23  Consult requested by: Sallyanne Kuster, NP   Subjective:   Stephanie Gates (DOB: 05-Feb-1966) is a 57 y.o. female who presents to the clinic on 06/10/2023 with a chief complaint of Rash and Establish Care .    History obtained from: chart review and patient.   Reports onset of rash after pacemaker implant.  States she had the implant placed 6/25 and developed itching the next day followed by development of bumpy red rash mostly on neck, arms, back, face.  It is very itchy.  No hives/welts.  No history of eczema or rashes to metals.  Denies starting any new medications except for what was given in OR.  She has been on her chronic heart failure meds for a long time; the most recent addition was Metoprolol from chart review but that was in March 2024.   Not taking any anti histamines Has not started use of any new products. She was seen in the ER and was given prednisone 20mg  for 4 days and Pepcid 20mg  BID without much relief.  She has not noted any rashes/drainage/pain around the pacemaker site; they have steri strips on and won't be removing it until 7/10.   The device is a BIV ICD CRT-D- [employer]- placed for HFrEF with LBBB.  Pre op drugs: fentanyl, versed, Vancomycin, irrigation Gentamicin, Heparin, Contrast Dye, Lidocaine   Past Medical History: Past Medical History:  Diagnosis Date   Allergy    seasonal   Anemia    Arthritis    right hip   Gastritis    Headache    sinus/allergies   Hypertension    OSA on CPAP    overall mild obstructive sleep apnea with an AHI of 10.4/h with no central events but during REM sleep she had moderate obstructive sleep apnea with a REM AHI of 22.2 /h.  She also had nocturnal hypoxemia with O2 sats less than 88% for 8.2 minutes   Seasonal allergies    Vertigo    1-2x/month   Wears contact lenses    Past Surgical History: Past Surgical History:  Procedure Laterality Date   BIV ICD  INSERTION CRT-D N/A 06/03/2023   Procedure: BIV ICD INSERTION CRT-D;  Surgeon: Regan Lemming, MD;  Location: [facility] INVASIVE CV LAB;  Service: Cardiovascular;  Laterality: N/A;   COLONOSCOPY N/A 05/03/2015   Procedure: COLONOSCOPY;  Surgeon: Midge Minium, MD;  Location: [facility] SURGERY CNTR;  Service: Gastroenterology;  Laterality: N/A;   COLONOSCOPY     COLONOSCOPY WITH PROPOFOL N/A 02/22/2021   Procedure: COLONOSCOPY WITH PROPOFOL;  Surgeon: Midge Minium, MD;  Location: [facility] ENDOSCOPY;  Service: Endoscopy;  Laterality: N/A;   ESOPHAGOGASTRODUODENOSCOPY N/A 05/03/2015   Procedure: ESOPHAGOGASTRODUODENOSCOPY (EGD);  Surgeon: Midge Minium, MD;  Location: [facility] SUR[facility]ERY [facility];  Service: Gastroenterology;  Laterality: N/A;   FOOT SURGERY  2014   Gastric bypass surgery     POLYPECTOMY  05/03/2015   Procedure: POLYPECTOMY INTESTINAL;  Surgeon: Midge Minium, MD;  Location: [facility] SURGERY [facility];  Service: Gastroenterology;;   RIGHT HEART CATH N/A 12/06/2022   Procedure: RIGHT HEART CATH;  Surgeon: Dolores Patty, MD;  Location: [facility] INVASIVE CV LAB;  Service: Cardiovascular;  Laterality: N/A;   RIGHT/LEFT HEART CATH AND CORONARY ANGIOGRAPHY N/A 12/13/2022   Procedure: RIGHT/LEFT HEART CATH AND CORONARY ANGIOGRAPHY;  Surgeon: Laurey Morale, MD;  Location: [facility] INVASIVE CV LAB;  Service: Cardiovascular;  Laterality: N/A;    Family History: Family History  Problem  Relation Age of Onset   Asthma Mother    Heart failure Mother    Diabetes Mother    Hypertension Mother    Cancer Father    Diabetes Father    Hypertension Father    Heart failure Brother 42    Social History:  Lives in a 17 year house Flooring in bedroom: Engineer, civil (consulting) Pets: none Tobacco use/exposure: none Job: Armed forces operational officer   Medication List:  Allergies as of 06/10/2023       Reactions   Bisoprolol Diarrhea   Penicillins Itching        Medication List        Accurate as of June 10, 2023 12:22 PM. If you have any questions,  ask your nurse or doctor.          acetaminophen 500 MG tablet Commonly known as: TYLENOL Take 1,000 mg by mouth every 6 (six) hours as needed for moderate pain.   B-12 3000 MCG Caps Take 3,000 mcg by mouth daily.   digoxin 0.125 MG tablet Commonly known as: LANOXIN Take 1 tablet (0.125 mg total) by mouth daily.   famotidine 20 MG tablet Commonly known as: PEPCID Take 1 tablet (20 mg total) by mouth 2 (two) times daily.   furosemide 40 MG tablet Commonly known as: LASIX TAKE 1 TABLET(40 MG) BY MOUTH DAILY   losartan 25 MG tablet Commonly known as: COZAAR Take 1 tablet (25 mg total) by mouth daily.   metoprolol succinate 25 MG 24 hr tablet Commonly known as: TOPROL-XL Take 25 mg by mouth daily.   multivitamin capsule Take 1 capsule by mouth daily.   OVER THE COUNTER MEDICATION Take 2 capsules by mouth at bedtime. Beauty Sleep gummies   pantoprazole 40 MG tablet Commonly known as: PROTONIX Take 1 tablet (40 mg total) by mouth 2 (two) times daily. What changed:  when to take this reasons to take this   potassium chloride SA 20 MEQ tablet Commonly known as: KLOR-CON M Take 1 tablet (20 mEq total) by mouth daily.   predniSONE 10 MG tablet Commonly known as: DELTASONE Take 2 tablets (20 mg total) by mouth daily for 4 days.   REFRESH OP Place 1 drop into both eyes daily as needed (dry eyes).   spironolactone 25 MG tablet Commonly known as: ALDACTONE Take 1 tablet (25 mg total) by mouth daily.         REVIEW OF SYSTEMS: Pertinent positives and negatives discussed in HPI.   Objective:   Physical Exam: BP 124/72   Pulse 60   Temp 98.2 F (36.8 C) (Temporal)   Resp 18   Ht 5' 4.17" (1.63 m)   Wt 261 lb (118.4 kg)   LMP 02/11/2017   SpO2 99%   BMI 44.56 kg/m  Body mass index is 44.56 kg/m. GEN: alert, well developed HEENT: clear conjunctiva, MMM CHEST: steri strips with dried adhesive covering incision site, no rash present around it. HEART:  regular rate and rhythm, no murmur LUNGS: clear to auscultation bilaterally, no coughing, unlabored respiration ABDOMEN: soft, non distended  SKIN: maculopapular erythematous rash on back and arms, some dry skin on face.  No urticaria.   Reviewed:  06/03/2023: CRT-D placement with EP and medications used pre op in HPI  06/06/2023: seen in ED for maculopapular rash, possible allergic dermatitis. No hives on exam.  Given prednisone 60mg  followed by 20mg  x4 and Pepcid.   04/04/2023: saw EP for HFrEF with LBBB.  Discussed CRT  05/06/2023: saw [place]  NP with heart failure; has HFrEF <20% EF, severe MR/TR, LBBB.  On Lasix, Digoxin, Spironolactone, Losartan, Toprolol (started in March).      Assessment:   1. Rash and nonspecific skin eruption   2. Allergic contact dermatitis due to other agents     Plan/Recommendations:  Rash - Unclear history but onset after pacemaker placement.  No history of eczema or dermatitis to metals or chemicals.  Did discuss possibility of allergic contact dermatitis to implant or preop drugs but that generally takes several days to develop as it is a delayed hypersensitivity reaction; however, it is not unheard of to develop it earlier; furthermore these rashes generally do respond to oral prednisone which hers did not (although it was a low dose and short course).  No suspicion for IgE mediated reaction or direct mast cell degranulation reactions- no hives and no immediate reaction.  Will check CBC with diff/CMP to ensure no severe drug reaction as she was given several medications prior to placement.  - Will do patch testing in 2 weeks to TRUE test and metals. Avoid putting topical steroid on your upper back.  Unable to do it today due to recent oral prednisone use.  If negative, we might need her to see an academic center for biopsy/extended patch testing.  Will also contact Cardiology to see the exact type of device used and if they can give [place] a list of what it is made  of.   - Start Zyrtec 10mg  daily or twice daily to see if it helps with itching.   - Do a daily soaking tub bath in warm water for 10-15 minutes.  - Use a gentle, unscented cleanser at the end of the bath (such as Dove unscented bar or baby wash, or Aveeno sensitive body wash). Then rinse, pat half-way dry, and apply a gentle, unscented moisturizer cream or ointment (Cerave, Cetaphil, Eucerin, Aveeno)  all over while still damp. Dry skin makes the itching and rash worse. The skin should be moisturized with a gentle, unscented moisturizer at least twice daily.  - Use only unscented liquid laundry detergent. - Apply prescribed topical steroid (triamcinolone 0.1% below neck or hydrocortisone 2.5% above neck) to flared areas after the moisturizer has soaked into the skin (wait at least 30 minutes). Taper off the topical steroids as the skin improves. Do not use topical steroid for more than 7-10 days at a time.   Follow up in 2 weeks for patch testing (TRUE test and metal test): Monday, Wednesday, Friday and next Monday    Return in about 13 days (around 06/23/2023).  Alesia Morin, MD [facility]

## 2023-06-10 NOTE — Patient Instructions (Addendum)
Rash - Start Zyrtec 10mg  daily or twice daily for itching. - Will do patch testing in 2 weeks. Avoid putting topical on your upper back.   - We will obtain labwork today to evaluate your renal/liver function and also blood counts. - Do a daily soaking tub bath in warm water for 10-15 minutes.  - Use a gentle, unscented cleanser at the end of the bath (such as Dove unscented bar or baby wash, or Aveeno sensitive body wash). Then rinse, pat half-way dry, and apply a gentle, unscented moisturizer cream or ointment (Cerave, Cetaphil, Eucerin, Aveeno)  all over while still damp. Dry skin makes the itching and rash worse. The skin should be moisturized with a gentle, unscented moisturizer at least twice daily.  - Use only unscented liquid laundry detergent. - Apply prescribed topical steroid (triamcinolone 0.1% below neck or hydrocortisone 2.5% above neck) to flared areas after the moisturizer has soaked into the skin (wait at least 30 minutes). Taper off the topical steroids as the skin improves. Do not use topical steroid for more than 7-10 days at a time.   Follow up in 2 weeks for patch testing (TRUE test and metal test): Monday, Wednesday, Friday and next Monday

## 2023-06-11 ENCOUNTER — Telehealth: Payer: Self-pay | Admitting: Cardiology

## 2023-06-11 ENCOUNTER — Encounter: Payer: Self-pay | Admitting: Cardiology

## 2023-06-11 ENCOUNTER — Ambulatory Visit: Payer: 59

## 2023-06-11 DIAGNOSIS — Z0279 Encounter for issue of other medical certificate: Secondary | ICD-10-CM

## 2023-06-11 LAB — CBC WITH DIFFERENTIAL/PLATELET
Basophils Absolute: 0 10*3/uL (ref 0.0–0.2)
Basos: 0 %
EOS (ABSOLUTE): 0.4 10*3/uL (ref 0.0–0.4)
Eos: 3 %
Hematocrit: 39.8 % (ref 34.0–46.6)
Hemoglobin: 12.8 g/dL (ref 11.1–15.9)
Immature Grans (Abs): 0.1 10*3/uL (ref 0.0–0.1)
Immature Granulocytes: 1 %
Lymphocytes Absolute: 2.6 10*3/uL (ref 0.7–3.1)
Lymphs: 18 %
MCH: 26 pg — ABNORMAL LOW (ref 26.6–33.0)
MCHC: 32.2 g/dL (ref 31.5–35.7)
MCV: 81 fL (ref 79–97)
Monocytes Absolute: 1.2 10*3/uL — ABNORMAL HIGH (ref 0.1–0.9)
Monocytes: 8 %
Neutrophils Absolute: 10.6 10*3/uL — ABNORMAL HIGH (ref 1.4–7.0)
Neutrophils: 70 %
Platelets: 226 10*3/uL (ref 150–450)
RBC: 4.93 x10E6/uL (ref 3.77–5.28)
RDW: 15.8 % — ABNORMAL HIGH (ref 11.7–15.4)
WBC: 15 10*3/uL — ABNORMAL HIGH (ref 3.4–10.8)

## 2023-06-11 LAB — CMP14+EGFR
ALT: 33 IU/L — ABNORMAL HIGH (ref 0–32)
AST: 28 IU/L (ref 0–40)
Albumin: 4.1 g/dL (ref 3.8–4.9)
Alkaline Phosphatase: 107 IU/L (ref 44–121)
BUN/Creatinine Ratio: 15 (ref 9–23)
BUN: 17 mg/dL (ref 6–24)
Bilirubin Total: 0.3 mg/dL (ref 0.0–1.2)
CO2: 24 mmol/L (ref 20–29)
Calcium: 9.5 mg/dL (ref 8.7–10.2)
Chloride: 102 mmol/L (ref 96–106)
Creatinine, Ser: 1.15 mg/dL — ABNORMAL HIGH (ref 0.57–1.00)
Globulin, Total: 3.3 g/dL (ref 1.5–4.5)
Glucose: 77 mg/dL (ref 70–99)
Potassium: 3.8 mmol/L (ref 3.5–5.2)
Sodium: 142 mmol/L (ref 134–144)
Total Protein: 7.4 g/dL (ref 6.0–8.5)
eGFR: 56 mL/min/{1.73_m2} — ABNORMAL LOW (ref >59)

## 2023-06-11 NOTE — Telephone Encounter (Signed)
Pt came by and was told to give an update on the rash that has come about, it has spread. She went to emergency room and was given steroids. She has an appt with the allergist on the 15th of July after the steroids wear off. Please call if necessary.

## 2023-06-11 NOTE — Telephone Encounter (Signed)
Paper Work Dropped Off: fmla pw signed. Payment has been received  Date: 06/11/2023  Location of paper:  in providers box by eod/ eow

## 2023-06-16 NOTE — Telephone Encounter (Addendum)
Per Dr. Elberta Fortis, discuss further w/ MDT. I spoke with them this morning and they are investigating      Camnitz, Andree Coss, MD  Birder Robson, MD; Baird Lyons, RN Yes we will get in touch with [employer] to see. I think its titanium but don't know about the leads.       Previous Messages    ----- Message ----- From: Birder Robson, MD Sent: 06/10/2023  12:39 PM EDT To: Regan Lemming, MD Subject: Rash after CRT-D                              Hey Dr. Elberta Fortis  I just saw Ms. Woehler in clinic for a rash, started a day after placement of CRT-D.  Rash is very non specific on exam. I am not certain what is causing the rash; usually contact dermatitis rashes start several days to weeks later not the next day.  This is not an IgE mediated process, no hives.  Could be having some delayed reaction to pre op drugs, will check CBC and CMP to make sure this isn't DRESS. Don't think it is SJS/TEN- no sloughing, mucosal involvement.   We can do patch testing to metals and include frequent chemicals also with TRUE test in case it was not the device but just so happened to be at the same time.  I cannot do this now because of recent oral prednisone. If we do not identify anything, might need to see academic derm/allergy that does extended patch testing.   Would it be possible to get in touch with the [employer] rep so we can get a list of composition for the leads and device?  Thanks Alesia Morin, MD Allergy and Immunology

## 2023-06-16 NOTE — Telephone Encounter (Signed)
I did follow up this evening to see how she is doing, if improving. Pt reports somewhat improved but still rash but it is getting better slowly. Still itching. Aware that we have MDT looking into things to see if correlation. Pt appreciates my follow up. She will keep wound check on Wednesday. Aware to call if things worsen/change, pt agreeable.

## 2023-06-18 ENCOUNTER — Ambulatory Visit: Payer: 59 | Attending: Cardiology

## 2023-06-18 DIAGNOSIS — I5022 Chronic systolic (congestive) heart failure: Secondary | ICD-10-CM | POA: Diagnosis not present

## 2023-06-18 DIAGNOSIS — I447 Left bundle-branch block, unspecified: Secondary | ICD-10-CM

## 2023-06-18 LAB — CUP PACEART INCLINIC DEVICE CHECK
Date Time Interrogation Session: 20240710170034
Implantable Lead Connection Status: 753985
Implantable Lead Connection Status: 753985
Implantable Lead Connection Status: 753985
Implantable Lead Implant Date: 20240625
Implantable Lead Implant Date: 20240625
Implantable Lead Implant Date: 20240625
Implantable Lead Location: 753857
Implantable Lead Location: 753859
Implantable Lead Location: 753860
Implantable Lead Model: 4598
Implantable Lead Model: 5076
Implantable Pulse Generator Implant Date: 20240625

## 2023-06-18 NOTE — Progress Notes (Signed)

## 2023-06-18 NOTE — Patient Instructions (Signed)

## 2023-06-23 ENCOUNTER — Encounter: Payer: Self-pay | Admitting: Family Medicine

## 2023-06-23 ENCOUNTER — Ambulatory Visit (INDEPENDENT_AMBULATORY_CARE_PROVIDER_SITE_OTHER): Payer: 59 | Admitting: Family Medicine

## 2023-06-23 ENCOUNTER — Other Ambulatory Visit: Payer: Self-pay

## 2023-06-23 VITALS — BP 102/60 | HR 82 | Temp 97.8°F | Resp 16 | Wt 262.0 lb

## 2023-06-23 DIAGNOSIS — L2389 Allergic contact dermatitis due to other agents: Secondary | ICD-10-CM

## 2023-06-23 NOTE — Progress Notes (Signed)
Follow-up Note  RE: Zachary Lovins MRN: [id number] DOB: September 21, 1966 Date of Office Visit: 06/23/2023  Primary care provider: Sallyanne Kuster, NP Referring provider: Sallyanne Kuster, NP   Olla returns to the office today for the patch test placement, given suspected history of contact dermatitis.    Diagnostics: True Test patches placed.   Metals patch testing: placed  Metals Patch     Time Antigen Placed  06/23/2023    Location  Back    Number of Test  11    Reading Interval  Day    Chromium chloride 1%  0     Cobalt chloride hexahydrate 1%  0     Molybdenum chloride 0.5%  0     Nickel sulfate hexahydrate 5%  0     Potassium dichromate 0.25%  0     Copper sulfate pentahydrate 2%  0     Titanium 0.1%  0     Manganese chloride 0.5%  0     Tantal 1%  0    Vanadium pentoxide 10%  0   Aluminum hydroxide 10%  0    Nickel sulfate hexahydrate 5%  0     Comments  placed    Plan:   Allergic contact dermatitis - Instructions provided on care of the patches for the next 48 hours. French Ana was instructed to avoid showering for the next 48 hours. Gwendola Hornaday will follow up in 48 hours. 96 hours, and 7 days for patch readings.    Call the clinic if this treatment plan is not working well for you  Follow up in 2 days or sooner if needed.

## 2023-06-23 NOTE — Patient Instructions (Signed)
  Diagnostics: True Test patches placed.   Metals patch testing:  Metals Patch     Time Antigen Placed  06/23/2023    Location  Back    Number of Test  11    Reading Interval  Day    Chromium chloride 1%  0     Cobalt chloride hexahydrate 1%  0     Molybdenum chloride 0.5%  0     Nickel sulfate hexahydrate 5%  0     Potassium dichromate 0.25%  0     Copper sulfate pentahydrate 2%  0     Titanium 0.1%  0     Manganese chloride 0.5%  0     Tantal 1%  0    Vanadium pentoxide 10%  0   Aluminum hydroxide 10%  0    Nickel sulfate hexahydrate 5%  0     Comments      Plan:   Allergic contact dermatitis - Instructions provided on care of the patches for the next 48 hours. Stephanie Gates was instructed to avoid showering for the next 48 hours. Stephanie Gates will follow up in 48 hours. 96 hours, and 7 days for patch readings.    Call the clinic if this treatment plan is not working well for you  Follow up in 2 days or sooner if needed.

## 2023-06-24 ENCOUNTER — Telehealth: Payer: Self-pay | Admitting: Cardiology

## 2023-06-24 NOTE — Telephone Encounter (Signed)
Pt informed that I just got the FMLA paperwork this morning as Dr. Elberta Fortis returned yesterday but we were at different offices. Aware will get addressed/signed tomorrow. Aware I will let her know once done. She appreciates my call/information.

## 2023-06-24 NOTE — Telephone Encounter (Signed)
Pt is requesting a callback to check on the status of FMLA paperwork. Please advise

## 2023-06-24 NOTE — Telephone Encounter (Signed)
See telephone note from today.

## 2023-06-24 NOTE — Telephone Encounter (Signed)
Pt called in asking about her FMLA, she wants to know if it has been sent out yet. Please advise.

## 2023-06-25 ENCOUNTER — Ambulatory Visit (INDEPENDENT_AMBULATORY_CARE_PROVIDER_SITE_OTHER): Payer: 59 | Admitting: Internal Medicine

## 2023-06-25 DIAGNOSIS — L2389 Allergic contact dermatitis due to other agents: Secondary | ICD-10-CM

## 2023-06-25 DIAGNOSIS — R21 Rash and other nonspecific skin eruption: Secondary | ICD-10-CM

## 2023-06-25 NOTE — Patient Instructions (Signed)
-   Stephanie Gates was instructed to avoid showering until final patch read is done.  Notnamed Croucher will follow up on Friday for TRUE test read and Friday, next Monday, next Wednesday for metal patch test read.

## 2023-06-25 NOTE — Progress Notes (Signed)
    Follow-up Note  RE: Stephanie Gates MRN: [id number] DOB: [birth year] Date of Office Visit: 06/25/2023  Primary care provider: Sallyanne Kuster, NP Referring provider: Sallyanne Kuster, NP   Stephanie Gates returns to the office today for follow up and patch test placement and first read, given suspected history of contact dermatitis.   Reports since last visit, her skin is doing better.  She is no longer itching.  Still has some peeling ongoing on arms.  The site around the device is fine, no itching or rashes there.    Physical Exam: Gen: awake, oriented, well developed HEENT: MMM, clear conjunctiva Heart: RRR Skin: some peeling on bilateral arms, no erythema or dermatitis  Diagnostics:  TRUE Test patches removed.  Negative 1st read today.  Metal patches had folded over and come off the skin.  Replaced with another metal patch on the R upper back.    Plan:   Allergic contact dermatitis Discussed with patient that patch testing tests for contact dermatitis and sometimes it does not correlate to how one will react to metals in the body. Positive patch testing results can help in avoiding those items; however, it is possible to get false negative results.  Nevertheless, this is the most accessible test for metal sensitivity currently available.  Please avoid strenuous physical activities and do not get the patches on the back wet.  Okay to take antihistamines for itching but avoid placing any creams on the back where the patches are. No steroids for 2 weeks before.  Stephanie Gates was instructed to avoid showering until final patch read is done.  Stephanie Gates will follow up on Friday for TRUE test read and Friday, next Monday, next Wednesday for metal patch test read.    Rash Post Op: - Did discuss it is also possible the rash was related to medication given during the OR as it is improving but still has not resolved. Will continue to monitor.  If it is device related, it should persist and  reoccur; again, the quick onset after placement does not completely fit with device related rash.   - Do a daily soaking tub bath in warm water for 10-15 minutes.  - Use a gentle, unscented cleanser at the end of the bath (such as Dove unscented bar or baby wash, or Aveeno sensitive body wash). Then rinse, pat half-way dry, and apply a gentle, unscented moisturizer cream or ointment (Cerave, Cetaphil, Eucerin, Aveeno)  all over while still damp. Dry skin makes the itching and rash worse. The skin should be moisturized with a gentle, unscented moisturizer at least twice daily.  - Use only unscented liquid laundry detergent. - Apply prescribed topical steroid (triamcinolone 0.1% below neck or hydrocortisone 2.5% above neck) to flared areas after the moisturizer has soaked into the skin (wait at least 30 minutes). Taper off the topical steroids as the skin improves. Do not use topical steroid for more than 7-10 days at a time.    Alesia Morin MD [facility]

## 2023-06-26 NOTE — Telephone Encounter (Signed)
Completed [employer] FMLA form scanned to [employer]. Original sent to patient.

## 2023-06-27 ENCOUNTER — Ambulatory Visit (INDEPENDENT_AMBULATORY_CARE_PROVIDER_SITE_OTHER): Payer: 59 | Admitting: Internal Medicine

## 2023-06-27 DIAGNOSIS — L2389 Allergic contact dermatitis due to other agents: Secondary | ICD-10-CM

## 2023-06-27 NOTE — Progress Notes (Signed)
   Follow Up Note  RE: Stephanie Gates MRN: [id number] DOB: 09-Jan-1966 Date of Office Visit: 06/27/2023  Referring provider: Sallyanne Kuster, NP Primary care provider: Sallyanne Kuster, NP  History of Present Illness: I had the pleasure of seeing Stephanie Gates for a follow up visit at the [facility] on 06/27/2023. She is a 57 y.o. female, who is being followed for contact dermatitis. Today she is here for second read for given suspected history of contact dermatitis.   Reports her skin is slowly improving.  Still with some peeling on the sides; arms are better.  Not much itching.   Diagnostic/reading:  TRUE TEST Day 4 read: all negative Metal patches removed and Day 2 read: all negative   Metals Patch     Time Antigen Placed      Location     Number of Test     Reading Interval  Day 2    Chromium chloride 1%  0     Cobalt chloride hexahydrate 1%  0     Molybdenum chloride 0.5%  0     Nickel sulfate hexahydrate 5%  0     Potassium dichromate 0.25%  0     Copper sulfate pentahydrate 2%  0     Titanium 0.1%  0     Manganese chloride 0.5%  0     Tantal 1%  0    Vanadium pentoxide 10%  0   Aluminum hydroxide 10%  0    Nickel sulfate hexahydrate 5%  0     Comments  none     Assessment and Plan: Stephanie Gates is a 57 y.o. female with: Concern for Contact Dermatitis:  The patient has been provided detailed information regarding the substances she is sensitive to, as well as products containing the substances.  Meticulous avoidance of these substances is recommended. If avoidance is not possible, the use of barrier creams or lotions is recommended. If symptoms persist or progress despite meticulous avoidance of chemicals/substances above, dermatology evaluation may be warranted. Follow up on Monday and Wednesday for further reads.   It was my pleasure to see Stephanie Gates today and participate in her care. Please feel free to contact me with any questions or  concerns.  Sincerely,   Alesia Morin, MD [facility]

## 2023-06-30 ENCOUNTER — Ambulatory Visit: Payer: 59 | Admitting: Family Medicine

## 2023-06-30 ENCOUNTER — Encounter: Payer: Self-pay | Admitting: Family Medicine

## 2023-06-30 DIAGNOSIS — L2389 Allergic contact dermatitis due to other agents: Secondary | ICD-10-CM

## 2023-06-30 NOTE — Progress Notes (Signed)
    Follow-up Note  RE: Stephanie Gates MRN: [id number] DOB: 1966-10-09 Date of Office Visit: 06/30/2023  Primary care provider: Sallyanne Kuster, NP Referring provider: Sallyanne Kuster, NP   [name] returns to the office today for the final patch test interpretation, given suspected history of contact dermatitis.   Diagnostics:   Metals patch 7 day :   Metals Patch     Time Antigen Placed  06-23-2023    Location  Back    Number of Test  11    Reading Interval  Day 7    Chromium chloride 1%  0     Cobalt chloride hexahydrate 1%  0     Molybdenum chloride 0.5%  0     Nickel sulfate hexahydrate 5%  0     Potassium dichromate 0.25%  0     Copper sulfate pentahydrate 2%  0     Titanium 0.1%  0     Manganese chloride 0.5%  0     Tantal 1%  0    Vanadium pentoxide 10%  0   Aluminum hydroxide 10%  0    Comments  N/A    Plan:   Allergic contact dermatitis Testing to metals was negative at the final patch reading today  Call the clinic if this treatment plan is not working well for you  Follow up in 2 months or sooner if needed.   Thank you for the opportunity to care for this patient.  Please do not hesitate to contact me with questions.  Thermon Leyland, FNP [facility]

## 2023-06-30 NOTE — Patient Instructions (Addendum)
Diagnostics:   Metals patch 7 day :   Metals Patch     Time Antigen Placed  06-23-2023    Location  Back    Number of Test  11    Reading Interval  Day 7    Chromium chloride 1%  0     Cobalt chloride hexahydrate 1%  0     Molybdenum chloride 0.5%  0     Nickel sulfate hexahydrate 5%  0     Potassium dichromate 0.25%  0     Copper sulfate pentahydrate 2%  0     Titanium 0.1%  0     Manganese chloride 0.5%  0     Tantal 1%  0    Vanadium pentoxide 10%  0   Aluminum hydroxide 10%  0    Comments  N/A    Plan:   Allergic contact dermatitis Testing to metals was negative at the final patch reading today  Call the clinic if this treatment plan is not working well for you  Follow up in 2 months or sooner if needed.

## 2023-07-02 ENCOUNTER — Encounter: Payer: 59 | Admitting: Internal Medicine

## 2023-07-06 ENCOUNTER — Other Ambulatory Visit (HOSPITAL_COMMUNITY): Payer: Self-pay | Admitting: Cardiology

## 2023-07-07 ENCOUNTER — Other Ambulatory Visit (HOSPITAL_COMMUNITY): Payer: Self-pay

## 2023-07-07 MED ORDER — SPIRONOLACTONE 25 MG PO TABS: 25.0000 mg | ORAL_TABLET | Freq: Every day | ORAL | 11 refills | Status: DC

## 2023-08-14 ENCOUNTER — Telehealth: Payer: Self-pay | Admitting: Cardiology

## 2023-08-14 DIAGNOSIS — I5022 Chronic systolic (congestive) heart failure: Secondary | ICD-10-CM

## 2023-08-14 NOTE — Telephone Encounter (Signed)
Patient states that her device is flipping. Calling to see what she needs to do. Please advise

## 2023-08-15 NOTE — Telephone Encounter (Signed)
Transmission received and reviewed.   Transmission WNL.  No lead issues noted.  Discussed with WC.  Ok for Pt to have chest xray next Tuesday at [facility].  Returned call to Pt advising of above.  Pt in agreement with chest xray next Tuesday.  Pt thankful for call back.  Advised would continue to monitor and call back next week with xray results.

## 2023-08-15 NOTE — Telephone Encounter (Signed)
Call placed to Pt.  Per Pt she is afraid that her device may have flipped.  She states she woke up and it appeared that the device was lifted in her chest.  Discussed with AT.  Will have Pt send remote transmission and order chest xray.  Requested Pt send a manual transmission.  Advised once reviewed, will follow up based on results.

## 2023-08-18 NOTE — Progress Notes (Incomplete)
PCP: She is establishing at [facility]  Primary HF Cardiologist: Dr Gala Romney   ADVANCED HF CLINC NOTE  HPI:  Ms Barder is a 57 y/o woman with HTN, morbid obesity s/p bariatric surgery, LBBB (130-140 ms), IDA, LBBB, OSA, and chronic HFrEF.  She had sleep study about 1 year ago and was told she had sleep apnea but she did not follow up with CPAP.   Admitted 12/23 with acute HF symptoms. Echo EF 20% with dyssynchrony, RV moderately down. Moderate to severe MR. Had CT chest. No PE. Subsequently developed AKI and inability to diurese. Advanced HF team consulted. Underwent L/RHC which showed markedly elevated filling pressures, low output with RV > LV failure, and normal coronaries.   Echo 03/06/23 EF 20% septal dyssynergy   cMRI: LVEF 28% RVEF 45% No LGE Mod TR Mild MR  Underwent CRT-D 6/24   Today she returns for HF follow up with her daughter. Works at [employer]. Sedentary job. Says she feels better. Easier to walk. Gets around store more easily. No edema, orthopnea or PND. No problems with med. No dizziness.   Echo today 08/19/23 EF 30-35%    2D Echo 12/02/22 LV < 20% global HK, grade IIDD. RV mildly reduced RA/LA mildly dilated. . Functional mitral regurgitation due to left ventricular disfunction and annular dilatation. The mitral valve is degenerative. Moderate to severe mitral valve regurgitation. No evidence of mitral stenosis. Tricuspid valve regurgitation is severe. T  RHC 12/06/22 RA = 22 RV = 49/22 PA = 49/26 (33) PCW = 23  Fick cardiac output/index = 6.1/2.6 TD CO/CI = 4.1/1.7 PVR = 1.6 (Fick) 2.5 (TD) Ao sat = 99% PA sat = 64%, 66% SVC sat = 65% PaPI = 1.0    R/LHC 12/13/22   RA mean 4 RV 41/8 PA 32/16, mean 23 PCWP mean 8 LV 112/16 AO 113/57 Oxygen saturations: PA 70% AO 98% Cardiac Output (Fick) 6.72  Cardiac Index (Fick) 3.04 Cardiac Output (Thermo) 5.90 Cardiac Index (Thermo) 2.67 PVR 2.5 WU PAPi 4   ROS: All systems negative except as  listed in HPI, PMH and Problem List.  SH:  Social History   Socioeconomic History   Marital status: Single    Spouse name: Not on file   Number of children: 1   Years of education: Not on file   Highest education level: Bachelor's degree (e.g., BA, AB, BS)  Occupational History   Occupation: Health visitor  Tobacco Use   Smoking status: Never   Smokeless tobacco: Never   Tobacco comments:    Never smoke 12/20/22  Vaping Use   Vaping status: Never Used  Substance and Sexual Activity   Alcohol use: No   Drug use: No   Sexual activity: Not Currently  Other Topics Concern   Not on file  Social History Narrative   Not on file   Social Determinants of Health   Financial Resource Strain: Low Risk  (12/05/2022)   Overall Financial Resource Strain (CARDIA)    Difficulty of Paying Living Expenses: Not very hard  Food Insecurity: No Food Insecurity (12/16/2022)   Hunger Vital Sign    Worried About Running Out of Food in the Last Year: Never true    Ran Out of Food in the Last Year: Never true  Transportation Needs: No Transportation Needs (12/05/2022)   PRAPARE - Administrator, Civil Service (Medical): No    Lack of Transportation (Non-Medical): No  Physical Activity: Not on file  Stress: Not  on file  Social Connections: Not on file  Intimate Partner Violence: Not on file    FH:  Family History  Problem Relation Age of Onset   Asthma Mother    Heart failure Mother    Diabetes Mother    Hypertension Mother    Cancer Father    Diabetes Father    Hypertension Father    Heart failure Brother 37    Past Medical History:  Diagnosis Date   Allergy    seasonal   Anemia    Arthritis    right hip   Gastritis    Headache    sinus/allergies   Hypertension    OSA on CPAP    overall mild obstructive sleep apnea with an AHI of 10.4/h with no central events but during REM sleep she had moderate obstructive sleep apnea with a REM AHI of 22.2 /h.  She also  had nocturnal hypoxemia with O2 sats less than 88% for 8.2 minutes   Seasonal allergies    Vertigo    1-2x/month   Wears contact lenses     Current Outpatient Medications  Medication Sig Dispense Refill   acetaminophen (TYLENOL) 500 MG tablet Take 1,000 mg by mouth every 6 (six) hours as needed for moderate pain.     Cyanocobalamin (B-12) 3000 MCG CAPS Take 3,000 mcg by mouth daily.     digoxin (LANOXIN) 0.125 MG tablet TAKE 1 TABLET(0.125 MG) BY MOUTH DAILY 30 tablet 5   famotidine (PEPCID) 20 MG tablet Take 1 tablet (20 mg total) by mouth 2 (two) times daily. 30 tablet 0   furosemide (LASIX) 40 MG tablet TAKE 1 TABLET(40 MG) BY MOUTH DAILY 30 tablet 5   losartan (COZAAR) 25 MG tablet Take 1 tablet (25 mg total) by mouth daily. 90 tablet 3   metoprolol succinate (TOPROL-XL) 25 MG 24 hr tablet Take 25 mg by mouth daily.     Multiple Vitamin (MULTIVITAMIN) capsule Take 1 capsule by mouth daily.     OVER THE COUNTER MEDICATION Take 2 capsules by mouth at bedtime. Beauty Sleep gummies     pantoprazole (PROTONIX) 40 MG tablet Take 40 mg by mouth as needed.     Polyvinyl Alcohol-Povidone (REFRESH OP) Place 1 drop into both eyes daily as needed (dry eyes).     potassium chloride SA (KLOR-CON M) 20 MEQ tablet TAKE 1 TABLET(20 MEQ) BY MOUTH DAILY 30 tablet 5   spironolactone (ALDACTONE) 25 MG tablet Take 1 tablet (25 mg total) by mouth daily. 30 tablet 11   No current facility-administered medications for this encounter.    Vitals:   08/19/23 0857  BP: 130/70  Pulse: 60  SpO2: 97%  Weight: 121.7 kg (268 lb 3.2 oz)     Wt Readings from Last 3 Encounters:  08/19/23 121.7 kg (268 lb 3.2 oz)  06/23/23 118.8 kg (262 lb)  06/10/23 118.4 kg (261 lb)    PHYSICAL EXAM: General:  Well appearing. No resp difficulty HEENT: normal Neck: supple. no JVD. Carotids 2+ bilat; no bruits. No lymphadenopathy or thryomegaly appreciated. Cor: PMI nondisplaced. Regular rate & rhythm. No rubs, gallops or  murmurs. Lungs: clear Abdomen: obese soft, nontender, nondistended. No hepatosplenomegaly. No bruits or masses. Good bowel sounds. Extremities: no cyanosis, clubbing, rash, edema Neuro: alert & orientedx3, cranial nerves grossly intact. moves all 4 extremities w/o difficulty. Affect pleasant  ECG: AV paced 60 QRS Personally reviewed  ASSESSMENT & PLAN:  1. Chronic HFrEF, NICM  - Developed acute HF  11/2022  - Echo 12/23 EF < 20%, severe LV dilation, mildly decreased RV systolic function, moderate-severe functional MR, severe TR.  - Cath 12/23 with elevated filling pressures. Normal cors - Uncertain cause of cardiomyopathy, ?LBBB vs HTN vs viral myocarditis.  LBBB is not markedly wide but significant dyssynchrony on echo. No family history of CHF.  - cMRI: LVEF 28% RVEF 45% Dyssynergy due to LBBB. No LGE Mod TR Mild MR - Underwent CRT-D 6/24 - Echo today 08/19/23 EF 30-35%  - Improved NYHA II. Volume status stable. Continue lasix 40 mg daily  - Stop digoxin - Continue spironolactone 25 mg daily  - Continue jardiance 10 mg daily  - Switch losartan 25 to Entresto 24/26 bid  - Continue Toprol 25 - Labs today  2. 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   - much improved today on echo (mild) after CRT   3. 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   - QRS on EKG 143 ms pre CRT - Underwent CRT-D 6/24 - QRS down to -> predicts benefit from CRT - Patient feels device may have moved. EP requesting CXR. Will order.   4. HTN:  - Improved but still elevated a bit. Start Entresto   5. OSA - Now on CPAP. continue  6. Obesity. - H/O Bariatric Surgery - Body mass index is 45.79 kg/m. - Continue weight loss efforts - Consider [id number]   Arvilla Meres, MD  9:20 AM

## 2023-08-19 ENCOUNTER — Ambulatory Visit (HOSPITAL_BASED_OUTPATIENT_CLINIC_OR_DEPARTMENT_OTHER)
Admission: RE | Admit: 2023-08-19 | Discharge: 2023-08-19 | Disposition: A | Discharge status: Home / Self Care | Payer: 59 | Source: Ambulatory Visit | Attending: Internal Medicine | Admitting: Internal Medicine

## 2023-08-19 ENCOUNTER — Other Ambulatory Visit (HOSPITAL_COMMUNITY): Payer: Self-pay

## 2023-08-19 ENCOUNTER — Encounter (HOSPITAL_COMMUNITY): Payer: Self-pay | Admitting: Internal Medicine

## 2023-08-19 ENCOUNTER — Ambulatory Visit (HOSPITAL_COMMUNITY)
Admission: RE | Admit: 2023-08-19 | Discharge: 2023-08-19 | Disposition: A | Discharge status: Home / Self Care | Payer: 59 | Source: Ambulatory Visit | Attending: Cardiology

## 2023-08-19 ENCOUNTER — Ambulatory Visit (HOSPITAL_COMMUNITY)
Admission: RE | Admit: 2023-08-19 | Discharge: 2023-08-19 | Disposition: A | Discharge status: Home / Self Care | Payer: 59 | Source: Ambulatory Visit | Attending: Internal Medicine | Admitting: Internal Medicine

## 2023-08-19 VITALS — BP 130/70 | HR 60 | Wt 268.2 lb

## 2023-08-19 DIAGNOSIS — Z6841 Body Mass Index (BMI) 40.0 and over, adult: Secondary | ICD-10-CM | POA: Insufficient documentation

## 2023-08-19 DIAGNOSIS — Z79899 Other long term (current) drug therapy: Secondary | ICD-10-CM | POA: Diagnosis not present

## 2023-08-19 DIAGNOSIS — G4733 Obstructive sleep apnea (adult) (pediatric): Secondary | ICD-10-CM | POA: Insufficient documentation

## 2023-08-19 DIAGNOSIS — I5022 Chronic systolic (congestive) heart failure: Secondary | ICD-10-CM | POA: Diagnosis not present

## 2023-08-19 DIAGNOSIS — I447 Left bundle-branch block, unspecified: Secondary | ICD-10-CM | POA: Insufficient documentation

## 2023-08-19 DIAGNOSIS — Z7984 Long term (current) use of oral hypoglycemic drugs: Secondary | ICD-10-CM | POA: Diagnosis not present

## 2023-08-19 DIAGNOSIS — I081 Rheumatic disorders of both mitral and tricuspid valves: Secondary | ICD-10-CM | POA: Insufficient documentation

## 2023-08-19 DIAGNOSIS — I340 Nonrheumatic mitral (valve) insufficiency: Secondary | ICD-10-CM

## 2023-08-19 DIAGNOSIS — I110 Hypertensive heart disease with heart failure: Secondary | ICD-10-CM | POA: Diagnosis present

## 2023-08-19 DIAGNOSIS — Z9884 Bariatric surgery status: Secondary | ICD-10-CM | POA: Diagnosis not present

## 2023-08-19 DIAGNOSIS — I10 Essential (primary) hypertension: Secondary | ICD-10-CM | POA: Diagnosis not present

## 2023-08-19 DIAGNOSIS — I428 Other cardiomyopathies: Secondary | ICD-10-CM | POA: Insufficient documentation

## 2023-08-19 DIAGNOSIS — E669 Obesity, unspecified: Secondary | ICD-10-CM

## 2023-08-19 LAB — ECHOCARDIOGRAM COMPLETE
Area-P 1/2: 3.65 cm2
Calc EF: 35.7 %
S' Lateral: 5 cm
Single Plane A2C EF: 38.7 %
Single Plane A4C EF: 32.1 %

## 2023-08-19 LAB — BRAIN NATRIURETIC PEPTIDE: B Natriuretic Peptide: 55 pg/mL (ref 0.0–100.0)

## 2023-08-19 LAB — BASIC METABOLIC PANEL
Anion gap: 7 (ref 5–15)
BUN: 14 mg/dL (ref 6–20)
CO2: 28 mmol/L (ref 22–32)
Calcium: 9 mg/dL (ref 8.9–10.3)
Chloride: 102 mmol/L (ref 98–111)
Creatinine, Ser: 0.96 mg/dL (ref 0.44–1.00)
GFR, Estimated: >60 mL/min (ref >60)
Glucose, Bld: 107 mg/dL — ABNORMAL HIGH (ref 70–99)
Potassium: 3.8 mmol/L (ref 3.5–5.1)
Sodium: 137 mmol/L (ref 135–145)

## 2023-08-19 MED ORDER — ENTRESTO 24-26 MG PO TABS: 1.0000 | ORAL_TABLET | Freq: Two times a day (BID) | ORAL | 3 refills | Status: DC

## 2023-08-19 NOTE — Patient Instructions (Signed)
Medication Changes:  STOP Digoxin  STOP Losartan  START Entresto 24/26 mg Twice daily   Lab Work:  Labs done today, your results will be available in MyChart, we will contact you for abnormal readings.  Testing/Procedures:  Chest X-Ray needed today  Referrals:  none  Special Instructions // Education:  Do the following things EVERYDAY: Weigh yourself in the morning before breakfast. Write it down and keep it in a log. Take your medicines as prescribed Eat low salt foods--Limit salt (sodium) to 2000 mg per day.  Stay as active as you can everyday Limit all fluids for the day to less than 2 liters   Follow-Up in: 3-4 months   At the [facility], you and your health needs are our priority. We have a designated team specialized in the treatment of Heart Failure. This Care Team includes your primary Heart Failure Specialized Cardiologist (physician), Advanced Practice Providers (APPs- Physician Assistants and Nurse Practitioners), and Pharmacist who all work together to provide you with the care you need, when you need it.   You may see any of the following providers on your designated Care Team at your next follow up:  Dr. Arvilla Meres Dr. Marca Ancona Dr. Marcos Eke, NP Robbie Lis, [place] [facility] [place], [place] Brynda Peon, NP Karle Plumber, PharmD   Please be sure to bring in all your medications bottles to every appointment.   Need to Contact us:  If you have any questions or concerns before your next appointment please send [place] a message through [place] or call our office at [phone].    TO LEAVE A MESSAGE FOR THE NURSE SELECT OPTION 2, PLEASE LEAVE A MESSAGE INCLUDING: YOUR NAME DATE OF BIRTH CALL BACK NUMBER REASON FOR CALL**this is important as we prioritize the call backs  YOU WILL RECEIVE A CALL BACK THE SAME DAY AS LONG AS YOU CALL BEFORE 4:00 PM

## 2023-08-19 NOTE — Progress Notes (Signed)
Medication Samples have been provided to the patient.  Drug name: entresto       Strength: 24/27 mg        Qty: 1  LOT: [id number]  Exp.Date: 07/2024  Dosing instructions: Take 1 tab Twice daily   The patient has been instructed regarding the correct time, dose, and frequency of taking this medication, including desired effects and most common side effects.   Meredith Staggers, RN 9:39 AM 08/19/2023

## 2023-08-20 NOTE — Telephone Encounter (Signed)
Xray completed.  It does appear that the Pt's device has flipped over.  Will forward to Dr. Elberta Fortis for review/plan of care.

## 2023-08-26 NOTE — Telephone Encounter (Signed)
Device appears to be working normally. No alerts and lead trends appear stable. Patient made aware to call if she notes any further concerns or increased pain. Voiced understanding.

## 2023-09-03 ENCOUNTER — Ambulatory Visit: Payer: 59

## 2023-09-03 DIAGNOSIS — I5022 Chronic systolic (congestive) heart failure: Secondary | ICD-10-CM

## 2023-09-03 DIAGNOSIS — I447 Left bundle-branch block, unspecified: Secondary | ICD-10-CM

## 2023-09-03 LAB — CUP PACEART REMOTE DEVICE CHECK
Battery Remaining Longevity: 102 mo
Battery Voltage: 3.05 V
Brady Statistic RV Percent Paced: 72.81 %
Date Time Interrogation Session: 20240924200829
HighPow Impedance: 63 Ohm
Implantable Lead Connection Status: 753985
Implantable Lead Connection Status: 753985
Implantable Lead Connection Status: 753985
Implantable Lead Implant Date: 20240625
Implantable Lead Implant Date: 20240625
Implantable Lead Implant Date: 20240625
Implantable Lead Location: 753857
Implantable Lead Location: 753859
Implantable Lead Location: 753860
Implantable Lead Model: 4598
Implantable Lead Model: 5076
Implantable Pulse Generator Implant Date: 20240625
Lead Channel Impedance Value: 304 Ohm
Lead Channel Impedance Value: 323 Ohm
Lead Channel Impedance Value: 361 Ohm
Lead Channel Impedance Value: 399 Ohm
Lead Channel Impedance Value: 437 Ohm
Lead Channel Impedance Value: 456 Ohm
Lead Channel Impedance Value: 494 Ohm
Lead Channel Impedance Value: 627 Ohm
Lead Channel Impedance Value: 627 Ohm
Lead Channel Impedance Value: 684 Ohm
Lead Channel Impedance Value: 703 Ohm
Lead Channel Impedance Value: 741 Ohm
Lead Channel Impedance Value: 760 Ohm
Lead Channel Pacing Threshold Amplitude: 0.5 V
Lead Channel Pacing Threshold Amplitude: 0.75 V
Lead Channel Pacing Threshold Amplitude: 0.75 V
Lead Channel Pacing Threshold Pulse Width: 0.4 ms
Lead Channel Pacing Threshold Pulse Width: 0.4 ms
Lead Channel Pacing Threshold Pulse Width: 0.4 ms
Lead Channel Sensing Intrinsic Amplitude: 14.9 mV
Lead Channel Sensing Intrinsic Amplitude: 5.3 mV
Lead Channel Setting Pacing Amplitude: 1.25 V
Lead Channel Setting Pacing Amplitude: 3.25 V
Lead Channel Setting Pacing Amplitude: 3.25 V
Lead Channel Setting Pacing Pulse Width: 0.4 ms
Lead Channel Setting Pacing Pulse Width: 0.4 ms
Lead Channel Setting Sensing Sensitivity: 0.3 mV
Zone Setting Status: 755011
Zone Setting Status: 755011
Zone Setting Status: 755011

## 2023-09-12 ENCOUNTER — Ambulatory Visit: Payer: 59 | Attending: Cardiology | Admitting: Cardiology

## 2023-09-12 NOTE — Progress Notes (Deleted)
  Electrophysiology Office Note:   Date:  09/12/2023  ID:  [name], DOB 04/04/1966, MRN [id number]  Primary Cardiologist: Arvilla Meres, MD Electrophysiologist: Regan Lemming, MD  {Click to update primary MD,subspecialty MD or APP then REFRESH:1}    History of Present Illness:   Stephanie Gates is a 57 y.o. female with h/o Hypertension, obesity post bariatric surgery, chronic systolic heart failure seen today for routine electrophysiology followup.   Since last being seen in our clinic the patient reports doing ***.  she denies chest pain, palpitations, dyspnea, PND, orthopnea, nausea, vomiting, dizziness, syncope, edema, weight gain, or early satiety.   Review of systems complete and found to be negative unless listed in HPI.      EP Information / Studies Reviewed:    {EKGtoday:28818}      ICD Interrogation-  reviewed in detail today,  See PACEART report.  Device History: Medtronic BiV ICD implanted 06/03/23 for CHF History of appropriate therapy: No History of AAD therapy: No   Risk Assessment/Calculations:     No BP recorded.  {Refresh Note OR Click here to enter BP  :1}***   STOP-Bang Score:  5  { Consider Dx Sleep Disordered Breathing or Sleep Apnea  ICD G47.33          :1}     Physical Exam:   VS:  LMP 02/11/2017    Wt Readings from Last 3 Encounters:  08/19/23 268 lb 3.2 oz (121.7 kg)  06/23/23 262 lb (118.8 kg)  06/10/23 261 lb (118.4 kg)     GEN: Well nourished, well developed in no acute distress NECK: No JVD; No carotid bruits CARDIAC: {EPRHYTHM:28826}, no murmurs, rubs, gallops RESPIRATORY:  Clear to auscultation without rales, wheezing or rhonchi  ABDOMEN: Soft, non-tender, non-distended EXTREMITIES:  No edema; No deformity   ASSESSMENT AND PLAN:    Chronic systolic dysfunction s/p Medtronic CRT-D  euvolemic today Stable on an appropriate medical regimen Normal ICD function See Pace Art report No changes today  2.   Hypertension:***  3.  Obstructive sleep apnea: CPAP compliance encouraged  4.  Obesity: Post bariatric surgery  5.  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  Improved post CRT.  Disposition:   Follow up with {EPPROVIDERS:28135} {EPFOLLOW WU:98119}   Signed, Orrin Yurkovich Jorja Loa, MD

## 2023-09-15 ENCOUNTER — Encounter: Payer: Self-pay | Admitting: Cardiology

## 2023-09-19 NOTE — Progress Notes (Signed)
Remote ICD transmission.   

## 2023-10-03 ENCOUNTER — Other Ambulatory Visit (HOSPITAL_COMMUNITY): Payer: Self-pay

## 2023-10-03 MED ORDER — FUROSEMIDE 40 MG PO TABS: 40.0000 mg | ORAL_TABLET | Freq: Every day | ORAL | 6 refills | Status: DC

## 2023-10-17 ENCOUNTER — Ambulatory Visit: Payer: 59 | Attending: Cardiology | Admitting: Physician Assistant

## 2023-10-17 ENCOUNTER — Encounter: Payer: Self-pay | Admitting: Physician Assistant

## 2023-10-17 VITALS — BP 98/48 | HR 64 | Ht 65.0 in | Wt 275.4 lb

## 2023-10-17 DIAGNOSIS — I10 Essential (primary) hypertension: Secondary | ICD-10-CM

## 2023-10-17 DIAGNOSIS — I5022 Chronic systolic (congestive) heart failure: Secondary | ICD-10-CM

## 2023-10-17 DIAGNOSIS — I428 Other cardiomyopathies: Secondary | ICD-10-CM

## 2023-10-17 DIAGNOSIS — Z95810 Presence of automatic (implantable) cardiac defibrillator: Secondary | ICD-10-CM

## 2023-10-17 DIAGNOSIS — I447 Left bundle-branch block, unspecified: Secondary | ICD-10-CM | POA: Diagnosis not present

## 2023-10-17 LAB — CUP PACEART INCLINIC DEVICE CHECK
Date Time Interrogation Session: 20241108170544
Implantable Lead Connection Status: 753985
Implantable Lead Connection Status: 753985
Implantable Lead Connection Status: 753985
Implantable Lead Implant Date: 20240625
Implantable Lead Implant Date: 20240625
Implantable Lead Implant Date: 20240625
Implantable Lead Location: 753857
Implantable Lead Location: 753859
Implantable Lead Location: 753860
Implantable Lead Model: 4598
Implantable Lead Model: 5076
Implantable Pulse Generator Implant Date: 20240625
Lead Channel Pacing Threshold Amplitude: 0.75 V
Lead Channel Pacing Threshold Amplitude: 0.75 V
Lead Channel Pacing Threshold Amplitude: 0.75 V
Lead Channel Pacing Threshold Pulse Width: 0.4 ms
Lead Channel Pacing Threshold Pulse Width: 0.4 ms
Lead Channel Pacing Threshold Pulse Width: 0.4 ms
Lead Channel Sensing Intrinsic Amplitude: 12.1 mV
Lead Channel Sensing Intrinsic Amplitude: 5.4 mV

## 2023-10-17 NOTE — Patient Instructions (Signed)
Medication Instructions:  INCREASE LASIX TO 80MG  ONCE A DAY FOR 2 DAYS THEN BACK TO 60MG  ONCE A DAY  *If you need a refill on your cardiac medications before your next appointment, please call your pharmacy*   Lab Work: NONE ORDERED   Testing/Procedures: NONE ORDERED   Follow-Up: At [facility], you and your health needs are our priority.  As part of our continuing mission to provide you with exceptional heart care, we have created designated Provider Care Teams.  These Care Teams include your primary Cardiologist (physician) and Advanced Practice Providers (APPs -  Physician Assistants and Nurse Practitioners) who all work together to provide you with the care you need, when you need it.  We recommend signing up for the patient portal called "MyChart".  Sign up information is provided on this After Visit Summary.  MyChart is used to connect with patients for Virtual Visits (Telemedicine).  Patients are able to view lab/test results, encounter notes, upcoming appointments, etc.  Non-urgent messages can be sent to your provider as well.   To learn more about what you can do with MyChart, go to [email or web address].    Your next appointment:   6 month(s)  Provider:   Francis Dowse, PA-C   Other Instructions

## 2023-10-17 NOTE — Progress Notes (Signed)
  Cardiology Office Note:  .   Date:  10/17/2023  ID:  [name], DOB [birth year], MRN [id number] PCP: Sallyanne Kuster, NP  [facility] Providers Cardiologist:  Arvilla Meres, MD Electrophysiologist:  Regan Lemming, MD {  History of Present Illness: .   Stephanie Gates is a 57 y.o. female w/PMHx of HTN, morbid obesity s/p bariatric surgery, LBBB (130-140 ms), IDA, LBBB, OSA, and chronic HFrEF, OSA (on CPAP)  Admitted 12/23 with acute HF symptoms. Echo EF 20% with dyssynchrony, RV moderately down. Moderate to severe MR. Had CT chest. No PE. Subsequently developed AKI and inability to diurese. Advanced HF team consulted. Underwent L/RHC which showed markedly elevated filling pressures, low output with RV > LV failure, and normal coronaries.    Echo 03/06/23 EF 20% septal dyssynergy    cMRI: LVEF 28% RVEF 45% No LGE Mod TR Mild MR  Referred to EP for device consideration  CRT-D implant 06/03/23  Echo 08/19/23 EF 30-35%   She saw Dr. Garth Schlatter 08/19/23, ECG: AV paced 60 QRS  Dig stopped, ARB >> Entresto Functional MR described as much improved on her echo done same day Some concern  that her device moved, CXR planned  (Personally reviewed) generator had moved inferiorly, leads appear stable in position  Today's visit is scheduled as her 91 day post implant visit  ROS:   She comes today accompanied by her daughter No further device concerns, since she mentioned it in Sept, does not think it has moved or flipped over any more No CP, palpitations Doing well Feel like she has a bit better stamina No near syncope or syncope    Device information MDT CRT-D implanted 06/03/23  Studies Reviewed: Marland Kitchen    EKG not done today  DEVICE interrogation done today and reviewed by myself  Battery and lead measurements are good Rare NSVT, longest 3 seconds Effective BP 97.4%   Risk Assessment/Calculations:    Physical Exam:   VS:  LMP 02/11/2017    Wt  Readings from Last 3 Encounters:  08/19/23 268 lb 3.2 oz (121.7 kg)  06/23/23 262 lb (118.8 kg)  06/10/23 261 lb (118.4 kg)    GEN: Well nourished, well developed in no acute distress NECK: No JVD; No carotid bruits CARDIAC: RRR, no murmurs, rubs, gallops RESPIRATORY:  CTA b/l without rales, wheezing or rhonchi  ABDOMEN: Soft, non-tender, non-distended EXTREMITIES:  No edema; No deformity   ICD site: is well healed, stable, no thinning, fluctuation, tethering  ASSESSMENT AND PLAN: .    ICD Intact function No programming changes made  NICM Chronic CHF LBBB No exam findings of voume OL though her Optivol has crossed threshold She suspected she was retainin a bit of fluid Not SOB I have advised she take 2 days of her "PRN" dose lasix.  She reports taking 60mg  daily, I advised 80mg  for 2 days Avoid sodium  C/w Dr. Lynne Logan  HTN Relative low BP with meds No symptoms   Dispo: will have her back in 12mo, sooner if needed  Signed, Sheilah Pigeon, PA-C

## 2023-11-03 ENCOUNTER — Telehealth (HOSPITAL_COMMUNITY): Payer: Self-pay | Admitting: Cardiology

## 2023-11-03 ENCOUNTER — Ambulatory Visit: Payer: 59 | Admitting: Nurse Practitioner

## 2023-11-03 DIAGNOSIS — I5022 Chronic systolic (congestive) heart failure: Secondary | ICD-10-CM

## 2023-11-03 NOTE — Telephone Encounter (Signed)
Pt called to report increase in SOB Weight normally 270-275 Weight today 274  Reports swelling  No recent medication changes (fluid elevated at EP app 11/8 was advised to increase to 80 lasix x 2 days-currently taking 60 daily) Reports compliance with daily meds  Pt scheduled for follow up with HF clinic 12/10  Pt to send remote transmission   Please advise

## 2023-11-04 MED ORDER — FUROSEMIDE 40 MG PO TABS: 80.0000 mg | ORAL_TABLET | Freq: Every day | ORAL | 6 refills | Status: DC

## 2023-11-04 MED ORDER — POTASSIUM CHLORIDE CRYS ER 20 MEQ PO TBCR: 20.0000 meq | EXTENDED_RELEASE_TABLET | Freq: Two times a day (BID) | ORAL | 6 refills | Status: AC

## 2023-11-04 NOTE — Telephone Encounter (Signed)
Pt aware and voiced understanding 

## 2023-11-10 ENCOUNTER — Ambulatory Visit: Payer: 59 | Admitting: Nurse Practitioner

## 2023-11-14 ENCOUNTER — Ambulatory Visit (HOSPITAL_COMMUNITY)
Admission: RE | Admit: 2023-11-14 | Discharge: 2023-11-14 | Disposition: A | Discharge status: Home / Self Care | Payer: 59 | Source: Ambulatory Visit | Attending: Cardiology | Admitting: Cardiology

## 2023-11-14 DIAGNOSIS — I5022 Chronic systolic (congestive) heart failure: Secondary | ICD-10-CM | POA: Insufficient documentation

## 2023-11-14 LAB — BASIC METABOLIC PANEL
Anion gap: 9 (ref 5–15)
BUN: 18 mg/dL (ref 6–20)
CO2: 26 mmol/L (ref 22–32)
Calcium: 9.6 mg/dL (ref 8.9–10.3)
Chloride: 105 mmol/L (ref 98–111)
Creatinine, Ser: 1.46 mg/dL — ABNORMAL HIGH (ref 0.44–1.00)
GFR, Estimated: 42 mL/min — ABNORMAL LOW (ref >60)
Glucose, Bld: 94 mg/dL (ref 70–99)
Potassium: 4.6 mmol/L (ref 3.5–5.1)
Sodium: 140 mmol/L (ref 135–145)

## 2023-11-18 ENCOUNTER — Ambulatory Visit (HOSPITAL_COMMUNITY)
Admission: RE | Admit: 2023-11-18 | Discharge: 2023-11-18 | Disposition: A | Discharge status: Home / Self Care | Payer: 59 | Source: Ambulatory Visit | Attending: Cardiology | Admitting: Cardiology

## 2023-11-18 ENCOUNTER — Encounter (HOSPITAL_COMMUNITY): Payer: Self-pay

## 2023-11-18 VITALS — BP 104/60 | HR 77 | Wt 281.8 lb

## 2023-11-18 DIAGNOSIS — I081 Rheumatic disorders of both mitral and tricuspid valves: Secondary | ICD-10-CM | POA: Diagnosis not present

## 2023-11-18 DIAGNOSIS — Z9884 Bariatric surgery status: Secondary | ICD-10-CM | POA: Diagnosis not present

## 2023-11-18 DIAGNOSIS — D509 Iron deficiency anemia, unspecified: Secondary | ICD-10-CM | POA: Diagnosis not present

## 2023-11-18 DIAGNOSIS — Z79899 Other long term (current) drug therapy: Secondary | ICD-10-CM | POA: Diagnosis not present

## 2023-11-18 DIAGNOSIS — I447 Left bundle-branch block, unspecified: Secondary | ICD-10-CM | POA: Diagnosis not present

## 2023-11-18 DIAGNOSIS — I428 Other cardiomyopathies: Secondary | ICD-10-CM | POA: Diagnosis not present

## 2023-11-18 DIAGNOSIS — I110 Hypertensive heart disease with heart failure: Secondary | ICD-10-CM | POA: Diagnosis present

## 2023-11-18 DIAGNOSIS — I5022 Chronic systolic (congestive) heart failure: Secondary | ICD-10-CM | POA: Diagnosis not present

## 2023-11-18 DIAGNOSIS — Z6841 Body Mass Index (BMI) 40.0 and over, adult: Secondary | ICD-10-CM

## 2023-11-18 DIAGNOSIS — I361 Nonrheumatic tricuspid (valve) insufficiency: Secondary | ICD-10-CM

## 2023-11-18 DIAGNOSIS — I159 Secondary hypertension, unspecified: Secondary | ICD-10-CM

## 2023-11-18 DIAGNOSIS — I340 Nonrheumatic mitral (valve) insufficiency: Secondary | ICD-10-CM | POA: Diagnosis not present

## 2023-11-18 DIAGNOSIS — G4733 Obstructive sleep apnea (adult) (pediatric): Secondary | ICD-10-CM | POA: Insufficient documentation

## 2023-11-18 LAB — BASIC METABOLIC PANEL
Anion gap: 6 (ref 5–15)
BUN: 21 mg/dL — ABNORMAL HIGH (ref 6–20)
CO2: 28 mmol/L (ref 22–32)
Calcium: 9.6 mg/dL (ref 8.9–10.3)
Chloride: 106 mmol/L (ref 98–111)
Creatinine, Ser: 1.46 mg/dL — ABNORMAL HIGH (ref 0.44–1.00)
GFR, Estimated: 42 mL/min — ABNORMAL LOW (ref >60)
Glucose, Bld: 67 mg/dL — ABNORMAL LOW (ref 70–99)
Potassium: 4.4 mmol/L (ref 3.5–5.1)
Sodium: 140 mmol/L (ref 135–145)

## 2023-11-18 LAB — BRAIN NATRIURETIC PEPTIDE: B Natriuretic Peptide: 56.5 pg/mL (ref 0.0–100.0)

## 2023-11-18 LAB — LIPID PANEL
Cholesterol: 156 mg/dL (ref 0–200)
HDL: 52 mg/dL (ref >40)
LDL Cholesterol: 87 mg/dL (ref 0–99)
Total CHOL/HDL Ratio: 3 {ratio}
Triglycerides: 83 mg/dL (ref <150)
VLDL: 17 mg/dL (ref 0–40)

## 2023-11-18 MED ORDER — TORSEMIDE 20 MG PO TABS: 60.0000 mg | ORAL_TABLET | Freq: Every day | ORAL | 3 refills | Status: DC

## 2023-11-18 NOTE — Patient Instructions (Addendum)
STOP Lasix.  START Torsemide 60 mg daily.  Labs done today, your results will be available in MyChart, we will contact you for abnormal readings.  You have been referred to the [facility]ART CARE PHARM[facility]. They will call you to arrange your appointment.   Your physician recommends that you schedule a follow-up appointment in: 1 month with the nurse practitioner and 4 months with Dr. Gala Romney (April 2025) ** PLEASE CALL THE OFFICE IN  [place] TO ARRANGE YOUR FOLLOW UP APPOINTMENT. **  If you have any questions or concerns before your next appointment please send [place] a message through [place] or call our office at [phone].    TO LEAVE A MESSAGE FOR THE NURSE SELECT OPTION 2, PLEASE LEAVE A MESSAGE INCLUDING: YOUR NAME DATE OF BIRTH CALL BACK NUMBER REASON FOR CALL**this is important as we prioritize the call backs  YOU WILL RECEIVE A CALL BACK THE SAME DAY AS LONG AS YOU CALL BEFORE 4:00 PM  At the [facility], you and your health needs are our priority. As part of our continuing mission to provide you with exceptional heart care, we have created designated Provider Care Teams. These Care Teams include your primary Cardiologist (physician) and Advanced Practice Providers (APPs- Physician Assistants and Nurse Practitioners) who all work together to provide you with the care you need, when you need it.   You may see any of the following providers on your designated Care Team at your next follow up: Dr Arvilla Meres Dr Marca Ancona Dr. Dorthula Nettles Dr. Clearnce Hasten Amy Filbert Schilder, NP Robbie Lis, [place] [facility] [place], [place] Brynda Peon, NP [place] Lee, NP Karle Plumber, PharmD   Please be sure to bring in all your medications bottles to every appointment.    Thank you for choosing [facility]

## 2023-11-18 NOTE — Progress Notes (Signed)
PCP: She is establishing at [facility]  Primary HF Cardiologist: Dr Gala Romney   ADVANCED HF CLINC NOTE  HPI:  Stephanie Gates is a 57 y/o woman with HTN, morbid obesity s/p bariatric surgery, LBBB (130-140 Stephanie), IDA, OSA, and chronic HFrEF.  She had sleep study about 1 year ago and was told she had sleep apnea but she did not follow up with CPAP.   Admitted 12/23 with acute HF symptoms. Echo EF 20% with dyssynchrony, RV moderately down. Moderate to severe MR. Had CT chest. No PE. Subsequently developed AKI and inability to diurese. Advanced HF team consulted. Underwent L/RHC which showed markedly elevated filling pressures, low output with RV > LV failure, and normal coronaries.   Underwent CRT-D 6/24   Previous device interrogation 10/17/23 with increased volume by EP. Instructed to increase Lasix to 80 bid x2 days. Called AHF clinic for increased SOB and swelling 11/03/23. Torsemide increased to 80 daily and potassium to 20 BID.   Today she returns for HF follow up. Works at [employer]. Overall feeling well. Reports improved SOB on increased Lasix, able to grocery shop without symptoms. CRT-D flipped in orientation, although leads intact causing some intermittent mild pain. No dizziness or PND/Orthopnea. Sleeping on 1 pillow.  Weight trending up slowly at home. Appetite ok. No fever or chills. Taking all medications.    Cardiac Studies:   Echo (9/24): EF 30-35%   cMRI (6/24): LVEF 28% RVEF 45% No LGE Mod TR Mild MR  Echo (3/24): EF 20% septal dyssynergy   2D Echo 12/02/22 LV < 20% global HK, grade IIDD. RV mildly reduced RA/LA mildly dilated. . Functional mitral regurgitation due to left ventricular disfunction and annular dilatation. The mitral valve is degenerative. Moderate to severe mitral valve regurgitation. No evidence of mitral stenosis. Tricuspid valve regurgitation is severe. T  RHC 12/06/22 RA = 22 RV = 49/22 PA = 49/26 (33) PCW = 23  Fick cardiac output/index =  6.1/2.6 TD CO/CI = 4.1/1.7 PVR = 1.6 (Fick) 2.5 (TD) Ao sat = 99% PA sat = 64%, 66% SVC sat = 65% PaPI = 1.0    R/LHC 12/13/22   RA mean 4 RV 41/8 PA 32/16, mean 23 PCWP mean 8 LV 112/16 AO 113/57 Oxygen saturations: PA 70% AO 98% Cardiac Output (Fick) 6.72  Cardiac Index (Fick) 3.04 Cardiac Output (Thermo) 5.90 Cardiac Index (Thermo) 2.67 PVR 2.5 WU PAPi 4  ROS: All systems negative except as listed in HPI, PMH and Problem List.  SH:  Social History   Socioeconomic History   Marital status: Single    Spouse name: Not on file   Number of children: 1   Years of education: Not on file   Highest education level: Bachelor's degree (e.g., BA, AB, BS)  Occupational History   Occupation: Health visitor  Tobacco Use   Smoking status: Never   Smokeless tobacco: Never   Tobacco comments:    Never smoke 12/20/22  Vaping Use   Vaping status: Never Used  Substance and Sexual Activity   Alcohol use: No   Drug use: No   Sexual activity: Not Currently  Other Topics Concern   Not on file  Social History Narrative   Not on file   Social Determinants of Health   Financial Resource Strain: Low Risk  (12/05/2022)   Overall Financial Resource Strain (CARDIA)    Difficulty of Paying Living Expenses: Not very hard  Food Insecurity: No Food Insecurity (12/16/2022)   Hunger Vital Sign  Worried About Programme researcher, broadcasting/film/video in the Last Year: Never true    Ran Out of Food in the Last Year: Never true  Transportation Needs: No Transportation Needs (12/05/2022)   PRAPARE - Administrator, Civil Service (Medical): No    Lack of Transportation (Non-Medical): No  Physical Activity: Not on file  Stress: Not on file  Social Connections: Not on file  Intimate Partner Violence: Not on file   FH:  Family History  Problem Relation Age of Onset   Asthma Mother    Heart failure Mother    Diabetes Mother    Hypertension Mother    Cancer Father    Diabetes Father     Hypertension Father    Heart failure Brother 69   Past Medical History:  Diagnosis Date   Allergy    seasonal   Anemia    Arthritis    right hip   Gastritis    Headache    sinus/allergies   Hypertension    OSA on CPAP    overall mild obstructive sleep apnea with an AHI of 10.4/h with no central events but during REM sleep she had moderate obstructive sleep apnea with a REM AHI of 22.2 /h.  She also had nocturnal hypoxemia with O2 sats less than 88% for 8.2 minutes   Seasonal allergies    Vertigo    1-2x/month   Wears contact lenses    Current Outpatient Medications  Medication Sig Dispense Refill   acetaminophen (TYLENOL) 500 MG tablet Take 1,000 mg by mouth every 6 (six) hours as needed for moderate pain.     Cyanocobalamin (B-12) 3000 MCG CAPS Take 3,000 mcg by mouth daily.     famotidine (PEPCID) 20 MG tablet Take 1 tablet (20 mg total) by mouth 2 (two) times daily. 30 tablet 0   metoprolol succinate (TOPROL-XL) 25 MG 24 hr tablet Take 25 mg by mouth daily.     Multiple Vitamin (MULTIVITAMIN) capsule Take 1 capsule by mouth daily.     OVER THE COUNTER MEDICATION Take 2 capsules by mouth at bedtime. Beauty Sleep gummies     pantoprazole (PROTONIX) 40 MG tablet Take 40 mg by mouth as needed (for acid reflux).     Polyvinyl Alcohol-Povidone (REFRESH OP) Place 1 drop into both eyes daily as needed (dry eyes).     potassium chloride SA (KLOR-CON M) 20 MEQ tablet Take 1 tablet (20 mEq total) by mouth 2 (two) times daily. 60 tablet 6   sacubitril-valsartan (ENTRESTO) 24-26 MG Take 1 tablet by mouth 2 (two) times daily. 180 tablet 3   spironolactone (ALDACTONE) 25 MG tablet Take 1 tablet (25 mg total) by mouth daily. 30 tablet 11   torsemide (DEMADEX) 20 MG tablet Take 3 tablets (60 mg total) by mouth daily. 200 tablet 3   No current facility-administered medications for this encounter.   Vitals:   11/18/23 0840  BP: 104/60  Pulse: 77  SpO2: 100%  Weight: 127.8 kg (281 lb 12.8  oz)    Wt Readings from Last 3 Encounters:  11/18/23 127.8 kg (281 lb 12.8 oz)  10/17/23 124.9 kg (275 lb 6.4 oz)  08/19/23 121.7 kg (268 lb 3.2 oz)   PHYSICAL EXAM: General:  Well appearing. No resp difficulty. HEENT: normal Neck: supple. JVD to midneck. Carotids 2+ bilat; no bruits. No lymphadenopathy or thryomegaly appreciated. Cor: PMI nondisplaced. Regular rate & rhythm. No rubs, gallops or murmurs. Lungs: clear Abdomen: soft, nontender, nondistended. No hepatosplenomegaly.  No bruits or masses. Good bowel sounds. Extremities: no cyanosis, clubbing, rash, 2+ edema BLE Neuro: alert & orientedx3, cranial nerves grossly intact. moves all 4 extremities w/o difficulty. Affect pleasant  ASSESSMENT & PLAN:  1. Chronic HFrEF, NICM  - Developed acute HF 11/2022  - Echo 12/23 EF < 20%, severe LV dilation, mildly decreased RV systolic function, moderate-severe functional MR, severe TR.  - Cath 12/23 with elevated filling pressures. Normal cors - Uncertain cause of cardiomyopathy, ?LBBB vs HTN vs viral myocarditis.  LBBB is not markedly wide but significant dyssynchrony on echo. No family history of CHF.  - cMRI 6/24: LVEF 28% RVEF 45% Dyssynergy due to LBBB. No LGE Mod TR Mild MR - Underwent CRT-D 6/24 - Echo (9/24): EF 30-35%  - NYHA II. Volume up on exam. Transition Lasix 80 daily to Torsemide 60 daily. Continue Potassium 20 bid. sCr elevated on prior BMET. Repeat today. Device interrogation attempted today, unsuccessful. - Continue spironolactone 25 mg daily  - Continue jardiance 10 mg daily  - Continue Entresto 24/26 bid  - Continue Toprol 25  2. 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   - much improved on echo 9/24 (mild) after CRT   3. 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   - QRS on EKG 143 Stephanie pre CRT - Underwent CRT-D 6/24 - On EKG 9/24 QRS down to -> predicts benefit from CRT - Device has flipped on  Xray. Leads intact. Mild intermittent pain at the site. Mobile in pocket.  4. HTN - Normotensive at present - Continue Entresto   5. OSA - Now on CPAP. Continue  6. Obesity. - H/O Bariatric Surgery - Body mass index is 46.89 kg/m. - Continue weight loss efforts - [id number] referral with PharmD.  Follow up with NP/PA Clinic in 4 weeks.  Follow up with Dr. Gala Romney in 4-6 months.   [place] Eleonora Peeler, NP  9:17 AM

## 2023-12-02 ENCOUNTER — Ambulatory Visit (INDEPENDENT_AMBULATORY_CARE_PROVIDER_SITE_OTHER): Payer: 59

## 2023-12-02 DIAGNOSIS — I447 Left bundle-branch block, unspecified: Secondary | ICD-10-CM | POA: Diagnosis not present

## 2023-12-02 DIAGNOSIS — I5022 Chronic systolic (congestive) heart failure: Secondary | ICD-10-CM

## 2023-12-02 LAB — CUP PACEART REMOTE DEVICE CHECK
Battery Remaining Longevity: 124 mo
Battery Voltage: 3.02 V
Brady Statistic RV Percent Paced: 74.46 %
Date Time Interrogation Session: 20241223223113
HighPow Impedance: 67 Ohm
Lead Channel Impedance Value: 285 Ohm
Lead Channel Impedance Value: 323 Ohm
Lead Channel Impedance Value: 380 Ohm
Lead Channel Impedance Value: 418 Ohm
Lead Channel Impedance Value: 418 Ohm
Lead Channel Impedance Value: 437 Ohm
Lead Channel Impedance Value: 532 Ohm
Lead Channel Impedance Value: 551 Ohm
Lead Channel Impedance Value: 627 Ohm
Lead Channel Impedance Value: 646 Ohm
Lead Channel Impedance Value: 665 Ohm
Lead Channel Impedance Value: 741 Ohm
Lead Channel Impedance Value: 741 Ohm
Lead Channel Pacing Threshold Amplitude: 0.625 V
Lead Channel Pacing Threshold Amplitude: 0.625 V
Lead Channel Pacing Threshold Amplitude: 0.75 V
Lead Channel Pacing Threshold Pulse Width: 0.4 ms
Lead Channel Pacing Threshold Pulse Width: 0.4 ms
Lead Channel Pacing Threshold Pulse Width: 0.4 ms
Lead Channel Sensing Intrinsic Amplitude: 15 mV
Lead Channel Sensing Intrinsic Amplitude: 5.4 mV
Lead Channel Setting Pacing Amplitude: 1 V
Lead Channel Setting Pacing Amplitude: 1.25 V
Lead Channel Setting Pacing Amplitude: 1.5 V
Lead Channel Setting Pacing Pulse Width: 0.4 ms
Lead Channel Setting Pacing Pulse Width: 0.4 ms
Lead Channel Setting Sensing Sensitivity: 0.3 mV
Zone Setting Status: 755011
Zone Setting Status: 755011
Zone Setting Status: 755011

## 2023-12-04 ENCOUNTER — Other Ambulatory Visit (HOSPITAL_COMMUNITY): Payer: Self-pay

## 2023-12-11 ENCOUNTER — Encounter: Payer: Self-pay | Admitting: Cardiology

## 2023-12-17 ENCOUNTER — Telehealth: Payer: Self-pay | Admitting: Cardiology

## 2023-12-17 NOTE — Telephone Encounter (Signed)
 Pt states that she got a phone call about getting a weight loss shot done. Please advise

## 2023-12-18 NOTE — Telephone Encounter (Signed)
 Pt aware Dr. Elberta Fortis did not order this. Will forward to advanced heart failure clinic to follow up on, but also forwarding to our pharmD pool. She says she thinks someone from pharmD has been trying to call her but not sure.

## 2023-12-19 ENCOUNTER — Telehealth (HOSPITAL_COMMUNITY): Payer: Self-pay

## 2023-12-19 ENCOUNTER — Encounter (HOSPITAL_COMMUNITY): Payer: Self-pay

## 2023-12-19 ENCOUNTER — Ambulatory Visit (HOSPITAL_COMMUNITY)
Admission: RE | Admit: 2023-12-19 | Discharge: 2023-12-19 | Disposition: A | Discharge status: Home / Self Care | Payer: 59 | Source: Ambulatory Visit | Attending: Cardiology | Admitting: Cardiology

## 2023-12-19 VITALS — BP 110/68 | HR 74 | Wt 278.6 lb

## 2023-12-19 DIAGNOSIS — Z9884 Bariatric surgery status: Secondary | ICD-10-CM | POA: Diagnosis present

## 2023-12-19 DIAGNOSIS — I5022 Chronic systolic (congestive) heart failure: Secondary | ICD-10-CM | POA: Diagnosis not present

## 2023-12-19 DIAGNOSIS — I10 Essential (primary) hypertension: Secondary | ICD-10-CM

## 2023-12-19 DIAGNOSIS — I081 Rheumatic disorders of both mitral and tricuspid valves: Secondary | ICD-10-CM | POA: Diagnosis not present

## 2023-12-19 DIAGNOSIS — Z6841 Body Mass Index (BMI) 40.0 and over, adult: Secondary | ICD-10-CM | POA: Insufficient documentation

## 2023-12-19 DIAGNOSIS — I340 Nonrheumatic mitral (valve) insufficiency: Secondary | ICD-10-CM

## 2023-12-19 DIAGNOSIS — I428 Other cardiomyopathies: Secondary | ICD-10-CM | POA: Insufficient documentation

## 2023-12-19 DIAGNOSIS — I110 Hypertensive heart disease with heart failure: Secondary | ICD-10-CM | POA: Insufficient documentation

## 2023-12-19 DIAGNOSIS — I447 Left bundle-branch block, unspecified: Secondary | ICD-10-CM

## 2023-12-19 DIAGNOSIS — G4733 Obstructive sleep apnea (adult) (pediatric): Secondary | ICD-10-CM | POA: Insufficient documentation

## 2023-12-19 LAB — BASIC METABOLIC PANEL
Anion gap: 9 (ref 5–15)
BUN: 37 mg/dL — ABNORMAL HIGH (ref 6–20)
CO2: 29 mmol/L (ref 22–32)
Calcium: 9.9 mg/dL (ref 8.9–10.3)
Chloride: 103 mmol/L (ref 98–111)
Creatinine, Ser: 1.49 mg/dL — ABNORMAL HIGH (ref 0.44–1.00)
GFR, Estimated: 41 mL/min — ABNORMAL LOW (ref >60)
Glucose, Bld: 85 mg/dL (ref 70–99)
Potassium: 4 mmol/L (ref 3.5–5.1)
Sodium: 141 mmol/L (ref 135–145)

## 2023-12-19 LAB — BRAIN NATRIURETIC PEPTIDE: B Natriuretic Peptide: 44 pg/mL (ref 0.0–100.0)

## 2023-12-19 MED ORDER — TORSEMIDE 20 MG PO TABS: 40.0000 mg | ORAL_TABLET | Freq: Two times a day (BID) | ORAL | 0 refills | Status: DC

## 2023-12-19 MED ORDER — TORSEMIDE 20 MG PO TABS: 40.0000 mg | ORAL_TABLET | Freq: Every day | ORAL | Status: DC

## 2023-12-19 NOTE — Progress Notes (Addendum)
 ADVANCED HF CLINIC NOTE Primary Care: Liana Fish, NP Primary Cardiologist: Toribio Fuel, MD  Chief Complaint: Heart Failure follow up HPI:  Stephanie Gates is a 58 y/o woman with HTN, morbid obesity s/p bariatric surgery, LBBB (130-140 Stephanie), IDA, OSA, and chronic HFrEF.  She had sleep study about 1 year ago and was told she had sleep apnea.  Admitted 12/23 with acute HF symptoms. Echo EF 20% with dyssynchrony, RV moderately down. Moderate to severe MR. Had CT chest. No PE. Subsequently developed AKI and inability to diurese. Advanced HF team consulted. Underwent L/RHC which showed markedly elevated filling pressures, low output with RV > LV failure, and normal coronaries.   Underwent CRT-D 6/24   Previous device interrogation 10/17/23 with increased volume by EP. Instructed to increase Lasix  to 80 bid x2 days. Called AHF clinic for increased SOB and swelling 11/03/23. Torsemide  increased to 80 daily and potassium to 20 BID.   Today she returns for HF follow up. Overall feeling fine. Mild pain at CRT-D site occasionally. Occasional shortness of breath, not related to exertion. No CP, dizziness, or edema. Currently fasting with her church for 21 days this month. She has appointment with PharmD for GLP1 coming up. No fever or chills. Weight at home 273 pounds. Taking all medications.   Device Interrogation (personally reviewed): Optivol stable. 97.2% VP, <0.1% AF, no VT, 3.5h/day activity  Cardiac Studies:   - Echo (12/23): EF <20%, G2DD, mildly reduced RV, Mod/sev MR, sev TR  - RHC (12/23): RA 22, PA 49/26 (33), PCWP 23, CO/CI (thermo) 4.1/1.7, PVR 2.5, PAPi 1   - R/LHC (1/24): RA 4, PA 32/16 (23), CO/CI (thermo) 5.9/2.67, PVR 2.5, PAPi 4  - Echo (3/24): EF 20% septal dyssynergy   - cMRI (6/24): LVEF 28%, RVEF 45%, No LGE, Mod TR, Mild MR  - Echo (9/24): EF 30-35%, RV nl, mild/mod MR, no TR  ROS: All systems negative except as listed in HPI, PMH and Problem List.  SH:   Social History   Socioeconomic History   Marital status: Single    Spouse name: Not on file   Number of children: 1   Years of education: Not on file   Highest education level: Bachelor's degree (e.g., BA, AB, BS)  Occupational History   Occupation: Health visitor  Tobacco Use   Smoking status: Never   Smokeless tobacco: Never   Tobacco comments:    Never smoke 12/20/22  Vaping Use   Vaping status: Never Used  Substance and Sexual Activity   Alcohol use: No   Drug use: No   Sexual activity: Not Currently  Other Topics Concern   Not on file  Social History Narrative   Not on file   Social Drivers of Health   Financial Resource Strain: Low Risk  (12/05/2022)   Overall Financial Resource Strain (CARDIA)    Difficulty of Paying Living Expenses: Not very hard  Food Insecurity: No Food Insecurity (12/16/2022)   Hunger Vital Sign    Worried About Running Out of Food in the Last Year: Never true    Ran Out of Food in the Last Year: Never true  Transportation Needs: No Transportation Needs (12/05/2022)   PRAPARE - Administrator, Civil Service (Medical): No    Lack of Transportation (Non-Medical): No  Physical Activity: Not on file  Stress: Not on file  Social Connections: Not on file  Intimate Partner Violence: Not on file   FH:  Family History  Problem Relation  Age of Onset   Asthma Mother    Heart failure Mother    Diabetes Mother    Hypertension Mother    Cancer Father    Diabetes Father    Hypertension Father    Heart failure Brother 92   Past Medical History:  Diagnosis Date   Allergy    seasonal   Anemia    Arthritis    right hip   Gastritis    Headache    sinus/allergies   Hypertension    OSA on CPAP    overall mild obstructive sleep apnea with an AHI of 10.4/h with no central events but during REM sleep she had moderate obstructive sleep apnea with a REM AHI of 22.2 /h.  She also had nocturnal hypoxemia with O2 sats less than 88% for  8.2 minutes   Seasonal allergies    Vertigo    1-2x/month   Wears contact lenses    Current Outpatient Medications  Medication Sig Dispense Refill   acetaminophen  (TYLENOL ) 500 MG tablet Take 1,000 mg by mouth every 6 (six) hours as needed for moderate pain.     Cyanocobalamin (B-12) 3000 MCG CAPS Take 3,000 mcg by mouth daily.     famotidine  (PEPCID ) 20 MG tablet Take 1 tablet (20 mg total) by mouth 2 (two) times daily. 30 tablet 0   metoprolol  succinate (TOPROL -XL) 25 MG 24 hr tablet Take 25 mg by mouth daily.     Multiple Vitamin (MULTIVITAMIN) capsule Take 1 capsule by mouth daily.     OVER THE COUNTER MEDICATION Take 2 capsules by mouth at bedtime. Beauty Sleep gummies     pantoprazole  (PROTONIX ) 40 MG tablet Take 40 mg by mouth as needed (for acid reflux).     Polyvinyl Alcohol-Povidone (REFRESH OP) Place 1 drop into both eyes daily as needed (dry eyes).     potassium chloride  SA (KLOR-CON  M) 20 MEQ tablet Take 1 tablet (20 mEq total) by mouth 2 (two) times daily. 60 tablet 6   sacubitril -valsartan  (ENTRESTO ) 24-26 MG Take 1 tablet by mouth 2 (two) times daily. 180 tablet 3   spironolactone  (ALDACTONE ) 25 MG tablet Take 1 tablet (25 mg total) by mouth daily. 30 tablet 11   torsemide  (DEMADEX ) 20 MG tablet Take 3 tablets (60 mg total) by mouth daily. 200 tablet 3   No current facility-administered medications for this encounter.   Vitals:   12/19/23 1016  BP: 110/68  Pulse: 74  SpO2: 98%  Weight: 126.4 kg (278 lb 9.6 oz)   Wt Readings from Last 3 Encounters:  12/19/23 126.4 kg (278 lb 9.6 oz)  11/18/23 127.8 kg (281 lb 12.8 oz)  10/17/23 124.9 kg (275 lb 6.4 oz)   PHYSICAL EXAM: General: Well appearing. No distress on RA HEENT: neck supple.   Cardiac: JVP ~6-7cm. S1 and S2 present. No murmurs or rub. Resp: Lung sounds clear and equal B/L Abdomen: Soft, non-tender, non-distended. + BS. Extremities: Warm and dry. No rash, cyanosis.  No peripheral edema.  Neuro: Alert and  oriented x3. Affect pleasant. Moves all extremities without difficulty.  ASSESSMENT & PLAN:  1. Chronic HFrEF, NICM  - Developed acute HF 11/2022  - Echo 12/23 EF < 20%, severe LV dilation, mildly decreased RV systolic function, moderate-severe functional MR, severe TR.  - Cath 12/23 with elevated filling pressures. Normal cors - Uncertain cause of cardiomyopathy, ?LBBB vs HTN vs viral myocarditis.  LBBB is not markedly wide but significant dyssynchrony on echo. No family history of  CHF.  - cMRI 6/24: LVEF 28% RVEF 45% Dyssynergy due to LBBB. No LGE Mod TR Mild MR - Underwent CRT-D 6/24 - Echo (9/24): EF 30-35%  - NYHA II. Volume much improved, euvolemic on exam. Optivol stable fluid status. - Continue Torsemide  60 md daily + KCL 20 bid. BMET/BNP today. - Continue spironolactone  25 mg daily  - Continue jardiance  10 mg daily  - Continue Entresto  24/26 bid  - Continue Toprol  25  2. 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   - much improved on echo 9/24 (mild) after CRT   3. 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   - QRS on EKG 143 Stephanie pre-CRT - Underwent CRT-D 6/24 - On EKG 9/24 QRS down to > predicts benefit from CRT - Device has flipped on Xray. Leads intact. Mild intermittent pain at the site. Mobile in pocket.  4. HTN - Normotensive at present - Continue Entresto    5. OSA - Continue CPA. Intermittently taking off during the night, its not comfortable.  - Interested in the [employer]. Defer conversation to next visit with active weight loss attempts and plans to start GLP1. Encouraged to talk about this with Dr. Shlomo.   6. Obesity. - H/O Bariatric Surgery - Body mass index is 46.36 kg/m. - Continue weight loss efforts - [id number] referral with PharmD.  Follow up with Dr. Bensimhon in 4 months.   Stephanie Moralez, NP  10:49 AM

## 2023-12-19 NOTE — Patient Instructions (Addendum)
 Thank you for coming in today  If you had labs drawn today, any labs that are abnormal the clinic will call you No news is good news  Medications: No changes  Follow up appointments:  Your physician recommends that you schedule a follow-up appointment in:  You will receive a reminder letter in the mail a few months in advance. If you don't receive a letter, please call our office to schedule the follow-up appointment.    Do the following things EVERYDAY: Weigh yourself in the morning before breakfast. Write it down and keep it in a log. Take your medicines as prescribed Eat low salt foods--Limit salt (sodium) to 2000 mg per day.  Stay as active as you can everyday Limit all fluids for the day to less than 2 liters   At the [facility], you and your health needs are our priority. As part of our continuing mission to provide you with exceptional heart care, we have created designated Provider Care Teams. These Care Teams include your primary Cardiologist (physician) and Advanced Practice Providers (APPs- Physician Assistants and Nurse Practitioners) who all work together to provide you with the care you need, when you need it.   You may see any of the following providers on your designated Care Team at your next follow up: Dr Toribio Fuel Dr Ezra Shuck Dr. Ria Gardenia Greig Lenetta, NP Caffie Shed, [place] [facility] [place], [place] Beckey Coe, NP Tinnie Redman, PharmD   Please be sure to bring in all your medications bottles to every appointment.    Thank you for choosing [facility]  If you have any questions or concerns before your next appointment please send us  a message through [place] or call our office at [phone].    TO LEAVE A MESSAGE FOR THE NURSE SELECT OPTION 2, PLEASE LEAVE A MESSAGE INCLUDING: YOUR NAME DATE OF BIRTH CALL BACK NUMBER REASON FOR CALL**this is important as we  prioritize the call backs  YOU WILL RECEIVE A CALL BACK THE SAME DAY AS LONG AS YOU CALL BEFORE 4:00 PM .

## 2023-12-19 NOTE — Telephone Encounter (Signed)
 Spoke with patient regarding the following results. Patient made aware and patient verbalized understanding.   Medication list updated.   Repeat labs ordered and scheduled.

## 2023-12-19 NOTE — Addendum Note (Signed)
 Addended by: Bea Laura B on: 12/19/2023 03:00 PM   Modules accepted: Orders

## 2023-12-19 NOTE — Telephone Encounter (Signed)
 Spoke with patient regarding the following results. Patient made aware and patient verbalized understanding.   Per [place], NP- do torsemide 40mg  once daily   Repeat labs ordered and scheduled patient aware- medication list updated.

## 2023-12-19 NOTE — Telephone Encounter (Signed)
-----   Message from [place] Lee sent at 12/19/2023  2:24 PM EST ----- Fluid marker down. Creatinine up. Please call and have her hold her Torsemide for two days and restart lower dose 40 mg bid. Repeat labs in 14 days

## 2023-12-19 NOTE — Addendum Note (Signed)
 Encounter addended by: Valisha Heslin, [place], NP on: 12/19/2023 1:20 PM  Actions taken: Clinical Note Signed

## 2023-12-27 ENCOUNTER — Telehealth: Payer: Self-pay | Admitting: Internal Medicine

## 2023-12-27 NOTE — Telephone Encounter (Signed)
Patient contacted cardiology after hours line to report her diastolic was 45 mmHg.  She reports feeling tired.  Her systolic BP was 111 mmHg.  Informed patient she should continue taking all of her heart failure medications as prescribed.

## 2024-01-02 ENCOUNTER — Ambulatory Visit (HOSPITAL_COMMUNITY)
Admission: RE | Admit: 2024-01-02 | Discharge: 2024-01-02 | Disposition: A | Discharge status: Home / Self Care | Payer: 59 | Source: Ambulatory Visit | Attending: Cardiology | Admitting: Cardiology

## 2024-01-02 DIAGNOSIS — I5022 Chronic systolic (congestive) heart failure: Secondary | ICD-10-CM | POA: Insufficient documentation

## 2024-01-02 LAB — BASIC METABOLIC PANEL
Anion gap: 10 (ref 5–15)
BUN: 27 mg/dL — ABNORMAL HIGH (ref 6–20)
CO2: 23 mmol/L (ref 22–32)
Calcium: 9.4 mg/dL (ref 8.9–10.3)
Chloride: 103 mmol/L (ref 98–111)
Creatinine, Ser: 1.39 mg/dL — ABNORMAL HIGH (ref 0.44–1.00)
GFR, Estimated: 44 mL/min — ABNORMAL LOW (ref >60)
Glucose, Bld: 78 mg/dL (ref 70–99)
Potassium: 4.3 mmol/L (ref 3.5–5.1)
Sodium: 136 mmol/L (ref 135–145)

## 2024-01-12 NOTE — Progress Notes (Signed)
 Remote ICD transmission.

## 2024-01-22 ENCOUNTER — Telehealth: Payer: Self-pay | Admitting: Pharmacist

## 2024-01-22 ENCOUNTER — Encounter: Payer: Self-pay | Admitting: Pharmacist

## 2024-01-22 ENCOUNTER — Ambulatory Visit: Payer: 59 | Attending: Cardiovascular Disease | Admitting: Pharmacist

## 2024-01-22 VITALS — Ht 65.0 in | Wt 287.6 lb

## 2024-01-22 DIAGNOSIS — G4733 Obstructive sleep apnea (adult) (pediatric): Secondary | ICD-10-CM | POA: Diagnosis not present

## 2024-01-22 NOTE — Patient Instructions (Addendum)
It was nice to meet you today  The medication we discussed today is called Zepbound which is administered once every week  I will complete the prior authorization and let you know the response  Let us know when you are down to your last pen and we will send in the next dose  I recommend going to www.[email or web address] and downloading the savings card  Please let us know if you have any questions  Laural Golden, PharmD, BCACP, CDCES, CPP [address] [place], [place], [address] Phone: [phone], Fax: [phone]

## 2024-01-22 NOTE — Telephone Encounter (Signed)
This encounter was created in error - please disregard.

## 2024-01-22 NOTE — Progress Notes (Signed)
Patient ID: Stephanie Gates                 DOB: 03/31/1966                    MRN: [id number]     HPI: Stephanie Gates is a 58 y.o. female patient referred to pharmacy clinic by Stephanie Lee NP to initiate GLP1-RA therapy. Patient sees Stephanie Gates for EP and Stephanie Gates for HF. PMH is significant for CHF, OSA, mitral regurgitation, pre DM, and obesity. Most recent BMI 47.86.  Patient presents today to discuss GLP1a. Has a history of bariatric surgery in the past. Was previously >315#. Has been through obesity counseling in the past prior to her weight loss surgery and had tried other oral medications plus calorie restriction.  Complains of sleep apnea symptoms. Wears CPAP nightly but dislikes it.  Drinks water and juices. Is trying to cut down on sugars and sweets.   Labs: Lab Results  Component Value Date   HGBA1C 6.1 (H) 05/06/2023    Wt Readings from Last 1 Encounters:  12/19/23 278 lb 9.6 oz (126.4 kg)    BP Readings from Last 1 Encounters:  12/19/23 110/68   Pulse Readings from Last 1 Encounters:  12/19/23 74       Component Value Date/Time   CHOL 156 11/18/2023 0915   CHOL 147 04/15/2022 0712   TRIG 83 11/18/2023 0915   HDL 52 11/18/2023 0915   HDL 52 04/15/2022 0712   CHOLHDL 3.0 11/18/2023 0915   VLDL 17 11/18/2023 0915   LDLCALC 87 11/18/2023 0915   LDLCALC 78 04/15/2022 4098    Past Medical History:  Diagnosis Date   Allergy    seasonal   Anemia    Arthritis    right hip   Gastritis    Headache    sinus/allergies   Hypertension    OSA on CPAP    overall mild obstructive sleep apnea with an AHI of 10.4/h with no central events but during REM sleep she had moderate obstructive sleep apnea with a REM AHI of 22.2 /h.  She also had nocturnal hypoxemia with O2 sats less than 88% for 8.2 minutes   Seasonal allergies    Vertigo    1-2x/month   Wears contact lenses     Current Outpatient Medications on File Prior to Visit  Medication Sig  Dispense Refill   acetaminophen (TYLENOL) 500 MG tablet Take 1,000 mg by mouth every 6 (six) hours as needed for moderate pain.     Cyanocobalamin (B-12) 3000 MCG CAPS Take 3,000 mcg by mouth daily.     famotidine (PEPCID) 20 MG tablet Take 1 tablet (20 mg total) by mouth 2 (two) times daily. 30 tablet 0   metoprolol succinate (TOPROL-XL) 25 MG 24 hr tablet Take 25 mg by mouth daily.     Multiple Vitamin (MULTIVITAMIN) capsule Take 1 capsule by mouth daily.     OVER THE COUNTER MEDICATION Take 2 capsules by mouth at bedtime. Beauty Sleep gummies     pantoprazole (PROTONIX) 40 MG tablet Take 40 mg by mouth as needed (for acid reflux).     Polyvinyl Alcohol-Povidone (REFRESH OP) Place 1 drop into both eyes daily as needed (dry eyes).     potassium chloride SA (KLOR-CON M) 20 MEQ tablet Take 1 tablet (20 mEq total) by mouth 2 (two) times daily. 60 tablet 6   sacubitril-valsartan (ENTRESTO) 24-26 MG Take 1 tablet by mouth 2 (  two) times daily. 180 tablet 3   spironolactone (ALDACTONE) 25 MG tablet Take 1 tablet (25 mg total) by mouth daily. 30 tablet 11   torsemide (DEMADEX) 20 MG tablet Take 2 tablets (40 mg total) by mouth daily.     No current facility-administered medications on file prior to visit.    Allergies  Allergen Reactions   Bisoprolol Diarrhea   Penicillins Itching     Assessment/Plan:  1. Weight loss/OSA - Patient BMI today 47.86kg/m2 placing her in the Class III severe obesity category.  Due to comorbidities such as OSA and CHF, recommend starting Zepbound.  Confirmed patient not pregnant and no personal or family history of medullary thyroid carcinoma (MTC) or Multiple Endocrine Neoplasia syndrome type 2 (MEN 2). Injection technique reviewed at today's visit.  Using demo pen, explained mechanism of action, storage, site selection, and administration. Advised patient on common side effects including nausea, diarrhea, dyspepsia, decreased appetite, and fatigue. Counseled patient  on reducing meal size and how to titrate medication to minimize side effects. Counseled patient to call if intolerable side effects or if experiencing dehydration, abdominal pain, or dizziness. Patient will adhere to dietary modifications and will target at least 150 minutes of moderate intensity exercise weekly.   Start Zepbound 2.5mg  q week  Laural Golden, PharmD, BCACP, CDCES, CPP [address], [address] [place], [place], [address] Phone: [phone], Fax: [phone]

## 2024-01-22 NOTE — Telephone Encounter (Signed)
Please complete PA for Zepbound. Recommend OSA diagnosis plus obesity

## 2024-01-23 ENCOUNTER — Telehealth: Payer: Self-pay | Admitting: Pharmacy Technician

## 2024-01-23 ENCOUNTER — Encounter: Payer: Self-pay | Admitting: Hematology and Oncology

## 2024-01-23 ENCOUNTER — Other Ambulatory Visit (HOSPITAL_COMMUNITY): Payer: Self-pay

## 2024-01-23 DIAGNOSIS — G4733 Obstructive sleep apnea (adult) (pediatric): Secondary | ICD-10-CM

## 2024-01-23 MED ORDER — ZEPBOUND 2.5 MG/0.5ML ~~LOC~~ SOAJ: 2.5000 mg | SUBCUTANEOUS | 0 refills | Status: DC

## 2024-01-23 NOTE — Telephone Encounter (Signed)
Pharmacy Patient Advocate Encounter   Received notification from Pt Calls Messages that prior authorization for Zepbound is required/requested.   Insurance verification completed.   The patient is insured through [facility] .   Per test claim: PA required; PA submitted to above mentioned insurance via CoverMyMeds Key/confirmation #/EOC [id number] Status is pending

## 2024-01-23 NOTE — Addendum Note (Signed)
Addended by: Cheree Ditto on: 01/23/2024 03:19 PM   Modules accepted: Orders

## 2024-01-23 NOTE — Telephone Encounter (Signed)
Pharmacy Patient Advocate Encounter  Received notification from [facility] that Prior Authorization for Zepbound has been APPROVED from 01/23/24 to 07/22/24. Ran test claim, Copay is $24.99- one month. This test claim was processed through [facility]- copay amounts may vary at other pharmacies due to pharmacy/plan contracts, or as the patient moves through the different stages of their insurance plan.   PA #/Case ID/Reference #: [id number]

## 2024-01-26 ENCOUNTER — Other Ambulatory Visit (HOSPITAL_COMMUNITY): Payer: Self-pay

## 2024-01-26 MED ORDER — ZEPBOUND 2.5 MG/0.5ML ~~LOC~~ SOAJ
2.5000 mg | SUBCUTANEOUS | 0 refills | Status: DC
  Filled 2024-01-26: qty 2, 28d supply, fill #0

## 2024-01-26 NOTE — Addendum Note (Signed)
Addended by: Tylene Fantasia on: 01/26/2024 02:00 PM   Modules accepted: Orders

## 2024-02-27 ENCOUNTER — Telehealth: Payer: Self-pay | Admitting: Pharmacist

## 2024-02-27 ENCOUNTER — Other Ambulatory Visit (HOSPITAL_COMMUNITY): Payer: Self-pay

## 2024-02-27 DIAGNOSIS — G4733 Obstructive sleep apnea (adult) (pediatric): Secondary | ICD-10-CM

## 2024-02-27 MED ORDER — ZEPBOUND 5 MG/0.5ML ~~LOC~~ SOAJ
5.0000 mg | SUBCUTANEOUS | 0 refills | Status: DC
  Filled 2024-02-27: qty 2, 28d supply, fill #0

## 2024-02-27 NOTE — Telephone Encounter (Signed)
 Patient called, needs next Zepbound dose sent in. Rx sent to pharmacy

## 2024-03-02 ENCOUNTER — Other Ambulatory Visit (HOSPITAL_COMMUNITY): Payer: Self-pay

## 2024-03-03 ENCOUNTER — Ambulatory Visit (INDEPENDENT_AMBULATORY_CARE_PROVIDER_SITE_OTHER): Payer: 59

## 2024-03-03 DIAGNOSIS — I447 Left bundle-branch block, unspecified: Secondary | ICD-10-CM

## 2024-03-03 LAB — CUP PACEART REMOTE DEVICE CHECK
Battery Remaining Longevity: 120 mo
Battery Voltage: 3.01 V
Brady Statistic AP VP Percent: 13.29 %
Brady Statistic AP VS Percent: 0.21 %
Brady Statistic AS VP Percent: 83.83 %
Brady Statistic AS VS Percent: 2.67 %
Brady Statistic RA Percent Paced: 13.47 %
Brady Statistic RV Percent Paced: 78 %
Date Time Interrogation Session: 20250326060041
HighPow Impedance: 70 Ohm
Lead Channel Impedance Value: 285 Ohm
Lead Channel Impedance Value: 323 Ohm
Lead Channel Impedance Value: 399 Ohm
Lead Channel Impedance Value: 418 Ohm
Lead Channel Impedance Value: 418 Ohm
Lead Channel Impedance Value: 437 Ohm
Lead Channel Impedance Value: 437 Ohm
Lead Channel Impedance Value: 589 Ohm
Lead Channel Impedance Value: 627 Ohm
Lead Channel Impedance Value: 627 Ohm
Lead Channel Impedance Value: 684 Ohm
Lead Channel Impedance Value: 760 Ohm
Lead Channel Impedance Value: 760 Ohm
Lead Channel Pacing Threshold Amplitude: 0.5 V
Lead Channel Pacing Threshold Amplitude: 0.75 V
Lead Channel Pacing Threshold Amplitude: 0.875 V
Lead Channel Pacing Threshold Pulse Width: 0.4 ms
Lead Channel Pacing Threshold Pulse Width: 0.4 ms
Lead Channel Pacing Threshold Pulse Width: 0.4 ms
Lead Channel Sensing Intrinsic Amplitude: 10.5 mV
Lead Channel Sensing Intrinsic Amplitude: 5.1 mV
Lead Channel Setting Pacing Amplitude: 1 V
Lead Channel Setting Pacing Amplitude: 1.5 V
Lead Channel Setting Pacing Amplitude: 1.5 V
Lead Channel Setting Pacing Pulse Width: 0.4 ms
Lead Channel Setting Pacing Pulse Width: 0.4 ms
Lead Channel Setting Sensing Sensitivity: 0.3 mV
Zone Setting Status: 755011
Zone Setting Status: 755011
Zone Setting Status: 755011

## 2024-03-10 ENCOUNTER — Other Ambulatory Visit (HOSPITAL_COMMUNITY): Payer: Self-pay | Admitting: Cardiology

## 2024-03-30 ENCOUNTER — Other Ambulatory Visit (HOSPITAL_COMMUNITY): Payer: Self-pay

## 2024-03-30 ENCOUNTER — Telehealth: Payer: Self-pay | Admitting: Pharmacist

## 2024-03-30 ENCOUNTER — Encounter: Payer: Self-pay | Admitting: Hematology and Oncology

## 2024-03-30 MED ORDER — ZEPBOUND 7.5 MG/0.5ML ~~LOC~~ SOAJ
7.5000 mg | SUBCUTANEOUS | 0 refills | Status: DC
  Filled 2024-03-30 (×2): qty 2, 28d supply, fill #0

## 2024-03-30 NOTE — Telephone Encounter (Signed)
 Patient called for Zepbound  refill. Next dose 7.5mg  once a week. Has not noticed any weight loss yet but feels like she has less appetite. No adverse effects

## 2024-03-31 ENCOUNTER — Ambulatory Visit
Admission: RE | Admit: 2024-03-31 | Discharge: 2024-03-31 | Disposition: A | Discharge status: Home / Self Care | Source: Ambulatory Visit | Attending: Nurse Practitioner | Admitting: Nurse Practitioner

## 2024-03-31 DIAGNOSIS — Z1231 Encounter for screening mammogram for malignant neoplasm of breast: Secondary | ICD-10-CM | POA: Insufficient documentation

## 2024-04-08 ENCOUNTER — Encounter (HOSPITAL_COMMUNITY): Payer: Self-pay | Admitting: Internal Medicine

## 2024-04-09 NOTE — Progress Notes (Signed)
 ADVANCED HF CLINIC NOTE Primary Care: Laurence Pons, NP Primary Cardiologist: Jules Oar, MD  Chief Complaint: Heart Failure follow up HPI:  Stephanie Gates is a 58 y/o woman with HTN, morbid obesity s/p bariatric surgery, LBBB (130-140 Stephanie), IDA, OSA, and chronic HFrEF.  She had sleep study about 1 year ago and was told she had sleep apnea.  Admitted 12/23 with acute HF symptoms. Echo EF 20% with dyssynchrony, RV moderately down. Moderate to severe MR. Had CT chest. No PE. Subsequently developed AKI and inability to diurese. Advanced HF team consulted. Underwent L/RHC which showed markedly elevated filling pressures, low output with RV > LV failure, and normal coronaries.   Underwent CRT-D 6/24   Previous device interrogation 10/17/23 with increased volume by EP. Instructed to increase Lasix  to 80 bid x2 days. Called AHF clinic for increased SOB and swelling 11/03/23. Torsemide  increased to 80 daily and potassium to 20 BID.   Today she returns for HF follow up. Overall feeling fine. Mild pain at CRT-D site occasionally. Occasional shortness of breath, not related to exertion. No CP, dizziness, or edema. Currently fasting with her church for 21 days this month. She has appointment with PharmD for GLP1 coming up. No fever or chills. Weight at home 273 pounds. Taking all medications.   Device Interrogation (personally reviewed): Optivol stable. 97.2% VP, <0.1% AF, no VT, 3.5h/day activity  Cardiac Studies:   - Echo (12/23): EF <20%, G2DD, mildly reduced RV, Mod/sev MR, sev TR  - RHC (12/23): RA 22, PA 49/26 (33), PCWP 23, CO/CI (thermo) 4.1/1.7, PVR 2.5, PAPi 1   - R/LHC (1/24): RA 4, PA 32/16 (23), CO/CI (thermo) 5.9/2.67, PVR 2.5, PAPi 4  - Echo (3/24): EF 20% septal dyssynergy   - cMRI (6/24): LVEF 28%, RVEF 45%, No LGE, Mod TR, Mild MR  - Echo (9/24): EF 30-35%, RV nl, mild/mod MR, no TR  ROS: All systems negative except as listed in HPI, PMH and Problem List.  SH:   Social History   Socioeconomic History   Marital status: Single    Spouse name: Not on file   Number of children: 1   Years of education: Not on file   Highest education level: Bachelor's degree (e.g., BA, AB, BS)  Occupational History   Occupation: Health visitor  Tobacco Use   Smoking status: Never   Smokeless tobacco: Never   Tobacco comments:    Never smoke 12/20/22  Vaping Use   Vaping status: Never Used  Substance and Sexual Activity   Alcohol use: No   Drug use: No   Sexual activity: Not Currently  Other Topics Concern   Not on file  Social History Narrative   Not on file   Social Drivers of Health   Financial Resource Strain: Low Risk  (12/05/2022)   Overall Financial Resource Strain (CARDIA)    Difficulty of Paying Living Expenses: Not very hard  Food Insecurity: No Food Insecurity (12/16/2022)   Hunger Vital Sign    Worried About Running Out of Food in the Last Year: Never true    Ran Out of Food in the Last Year: Never true  Transportation Needs: No Transportation Needs (12/05/2022)   PRAPARE - Administrator, Civil Service (Medical): No    Lack of Transportation (Non-Medical): No  Physical Activity: Not on file  Stress: Not on file  Social Connections: Not on file  Intimate Partner Violence: Not on file   FH:  Family History  Problem Relation  Age of Onset   Asthma Mother    Heart failure Mother    Diabetes Mother    Hypertension Mother    Cancer Father    Diabetes Father    Hypertension Father    Heart failure Brother 55   Breast cancer Neg Hx    Past Medical History:  Diagnosis Date   Allergy    seasonal   Anemia    Arthritis    right hip   Gastritis    Headache    sinus/allergies   Hypertension    OSA on CPAP    overall mild obstructive sleep apnea with an AHI of 10.4/h with no central events but during REM sleep she had moderate obstructive sleep apnea with a REM AHI of 22.2 /h.  She also had nocturnal hypoxemia with  O2 sats less than 88% for 8.2 minutes   Seasonal allergies    Vertigo    1-2x/month   Wears contact lenses    Current Outpatient Medications  Medication Sig Dispense Refill   acetaminophen  (TYLENOL ) 500 MG tablet Take 1,000 mg by mouth every 6 (six) hours as needed for moderate pain.     Cyanocobalamin (B-12) 3000 MCG CAPS Take 3,000 mcg by mouth daily.     famotidine  (PEPCID ) 20 MG tablet Take 1 tablet (20 mg total) by mouth 2 (two) times daily. 30 tablet 0   metoprolol  succinate (TOPROL -XL) 25 MG 24 hr tablet TAKE 1 TABLET(25 MG) BY MOUTH DAILY 90 tablet 1   Multiple Vitamin (MULTIVITAMIN) capsule Take 1 capsule by mouth daily.     OVER THE COUNTER MEDICATION Take 2 capsules by mouth at bedtime. Beauty Sleep gummies     pantoprazole  (PROTONIX ) 40 MG tablet Take 40 mg by mouth as needed (for acid reflux).     Polyvinyl Alcohol-Povidone (REFRESH OP) Place 1 drop into both eyes daily as needed (dry eyes).     potassium chloride  SA (KLOR-CON  M) 20 MEQ tablet Take 1 tablet (20 mEq total) by mouth 2 (two) times daily. 60 tablet 6   sacubitril-valsartan (ENTRESTO ) 24-26 MG Take 1 tablet by mouth 2 (two) times daily. 180 tablet 3   spironolactone  (ALDACTONE ) 25 MG tablet Take 1 tablet (25 mg total) by mouth daily. 30 tablet 11   tirzepatide  (ZEPBOUND ) 7.5 MG/0.5ML Pen Inject 7.5 mg into the skin once a week. 2 mL 0   torsemide  (DEMADEX ) 20 MG tablet Take 2 tablets (40 mg total) by mouth daily.     No current facility-administered medications for this visit.   There were no vitals filed for this visit.  Wt Readings from Last 3 Encounters:  01/22/24 130.5 kg (287 lb 9.6 oz)  12/19/23 126.4 kg (278 lb 9.6 oz)  11/18/23 127.8 kg (281 lb 12.8 oz)   PHYSICAL EXAM: General: Well appearing. No distress on RA HEENT: neck supple.   Cardiac: JVP ~6-7cm. S1 and S2 present. No murmurs or rub. Resp: Lung sounds clear and equal B/L Abdomen: Soft, non-tender, non-distended. + BS. Extremities: Warm and  dry. No rash, cyanosis.  No peripheral edema.  Neuro: Alert and oriented x3. Affect pleasant. Moves all extremities without difficulty.  ASSESSMENT & PLAN:  1. Chronic HFrEF, NICM  - Developed acute HF 11/2022  - Echo 12/23 EF < 20%, severe LV dilation, mildly decreased RV systolic function, moderate-severe functional MR, severe TR.  - Cath 12/23 with elevated filling pressures. Normal cors - Uncertain cause of cardiomyopathy, ?LBBB vs HTN vs viral myocarditis.  LBBB is  not markedly wide but significant dyssynchrony on echo. No family history of CHF.  - cMRI 6/24: LVEF 28% RVEF 45% Dyssynergy due to LBBB. No LGE Mod TR Mild MR - Underwent CRT-D 6/24 - Echo (9/24): EF 30-35%  - NYHA II. Volume much improved, euvolemic on exam. Optivol stable fluid status. - Continue Torsemide  60 md daily + KCL 20 bid. BMET/BNP today. - Continue spironolactone  25 mg daily  - Continue jardiance  10 mg daily  - Continue Entresto  24/26 bid  - Continue Toprol  25  2. 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   - much improved on echo 9/24 (mild) after CRT   3. 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   - QRS on EKG 143 Stephanie pre-CRT - Underwent CRT-D 6/24 - On EKG 9/24 QRS down to > predicts benefit from CRT - Device has flipped on Xray. Leads intact. Mild intermittent pain at the site. Mobile in pocket.  4. HTN - Normotensive at present - Continue Entresto    5. OSA - Continue CPA. Intermittently taking off during the night, its not comfortable.  - Interested in the InSpire. Defer conversation to next visit with active weight loss attempts and plans to start GLP1. Encouraged to talk about this with Dr. Micael Adas.   6. Obesity. - H/O Bariatric Surgery - There is no height or weight on file to calculate BMI. - Continue weight loss efforts - [id number] referral with PharmD.  Follow up with Dr. Bensimhon in 4 months.   Stephanie Hacking, FNP   1:08 PM

## 2024-04-12 ENCOUNTER — Telehealth (HOSPITAL_COMMUNITY): Payer: Self-pay

## 2024-04-12 NOTE — Telephone Encounter (Signed)
 Called to confirm/remind patient of their appointment at the [facility] on 8:30.   Appointment:   [x] Confirmed  [] Left mess   [] No answer/No voice mail  [] VM Full/unable to leave message  [] Phone not in service  Patient reminded to bring all medications and/or complete list.  Confirmed patient has transportation. Gave directions, instructed to utilize valet parking.

## 2024-04-13 ENCOUNTER — Ambulatory Visit (HOSPITAL_COMMUNITY)
Admission: RE | Admit: 2024-04-13 | Discharge: 2024-04-13 | Disposition: A | Discharge status: Home / Self Care | Source: Ambulatory Visit | Attending: Family Medicine | Admitting: Family Medicine

## 2024-04-13 ENCOUNTER — Encounter (HOSPITAL_COMMUNITY): Payer: Self-pay

## 2024-04-13 VITALS — BP 122/72 | HR 80 | Ht 65.0 in | Wt 283.8 lb

## 2024-04-13 DIAGNOSIS — I5022 Chronic systolic (congestive) heart failure: Secondary | ICD-10-CM | POA: Insufficient documentation

## 2024-04-13 DIAGNOSIS — M25471 Effusion, right ankle: Secondary | ICD-10-CM | POA: Diagnosis not present

## 2024-04-13 DIAGNOSIS — I110 Hypertensive heart disease with heart failure: Secondary | ICD-10-CM | POA: Diagnosis not present

## 2024-04-13 DIAGNOSIS — Z9884 Bariatric surgery status: Secondary | ICD-10-CM | POA: Diagnosis not present

## 2024-04-13 DIAGNOSIS — I447 Left bundle-branch block, unspecified: Secondary | ICD-10-CM | POA: Insufficient documentation

## 2024-04-13 DIAGNOSIS — R42 Dizziness and giddiness: Secondary | ICD-10-CM | POA: Diagnosis not present

## 2024-04-13 DIAGNOSIS — Z6841 Body Mass Index (BMI) 40.0 and over, adult: Secondary | ICD-10-CM | POA: Insufficient documentation

## 2024-04-13 DIAGNOSIS — R5383 Other fatigue: Secondary | ICD-10-CM | POA: Diagnosis not present

## 2024-04-13 DIAGNOSIS — I10 Essential (primary) hypertension: Secondary | ICD-10-CM

## 2024-04-13 DIAGNOSIS — I340 Nonrheumatic mitral (valve) insufficiency: Secondary | ICD-10-CM | POA: Diagnosis not present

## 2024-04-13 DIAGNOSIS — Z79899 Other long term (current) drug therapy: Secondary | ICD-10-CM | POA: Insufficient documentation

## 2024-04-13 DIAGNOSIS — I428 Other cardiomyopathies: Secondary | ICD-10-CM | POA: Insufficient documentation

## 2024-04-13 DIAGNOSIS — M25472 Effusion, left ankle: Secondary | ICD-10-CM | POA: Insufficient documentation

## 2024-04-13 DIAGNOSIS — Z7984 Long term (current) use of oral hypoglycemic drugs: Secondary | ICD-10-CM | POA: Diagnosis not present

## 2024-04-13 DIAGNOSIS — G4733 Obstructive sleep apnea (adult) (pediatric): Secondary | ICD-10-CM | POA: Insufficient documentation

## 2024-04-13 LAB — IRON AND TIBC
Iron: 45 ug/dL (ref 28–170)
Saturation Ratios: 10 % — ABNORMAL LOW (ref 10.4–31.8)
TIBC: 434 ug/dL (ref 250–450)
UIBC: 389 ug/dL

## 2024-04-13 LAB — FERRITIN: Ferritin: 71 ng/mL (ref 11–307)

## 2024-04-13 LAB — BASIC METABOLIC PANEL WITH GFR
Anion gap: 9 (ref 5–15)
BUN: 44 mg/dL — ABNORMAL HIGH (ref 6–20)
CO2: 26 mmol/L (ref 22–32)
Calcium: 9.4 mg/dL (ref 8.9–10.3)
Chloride: 103 mmol/L (ref 98–111)
Creatinine, Ser: 1.79 mg/dL — ABNORMAL HIGH (ref 0.44–1.00)
GFR, Estimated: 33 mL/min — ABNORMAL LOW
Glucose, Bld: 85 mg/dL (ref 70–99)
Potassium: 4 mmol/L (ref 3.5–5.1)
Sodium: 138 mmol/L (ref 135–145)

## 2024-04-13 LAB — TSH: TSH: 2.025 u[IU]/mL (ref 0.350–4.500)

## 2024-04-13 LAB — CBC
HCT: 38.1 % (ref 36.0–46.0)
Hemoglobin: 12.4 g/dL (ref 12.0–15.0)
MCH: 27.6 pg (ref 26.0–34.0)
MCHC: 32.5 g/dL (ref 30.0–36.0)
MCV: 84.9 fL (ref 80.0–100.0)
Platelets: 236 K/uL (ref 150–400)
RBC: 4.49 MIL/uL (ref 3.87–5.11)
RDW: 13.6 % (ref 11.5–15.5)
WBC: 10.2 K/uL (ref 4.0–10.5)
nRBC: 0 % (ref 0.0–0.2)

## 2024-04-13 LAB — BRAIN NATRIURETIC PEPTIDE: B Natriuretic Peptide: 37.3 pg/mL (ref 0.0–100.0)

## 2024-04-13 MED ORDER — TORSEMIDE 20 MG PO TABS: 20.0000 mg | ORAL_TABLET | Freq: Every day | ORAL | 3 refills | Status: DC

## 2024-04-13 MED ORDER — DAPAGLIFLOZIN PROPANEDIOL 10 MG PO TABS: 10.0000 mg | ORAL_TABLET | Freq: Every day | ORAL | 5 refills | Status: DC

## 2024-04-13 NOTE — Patient Instructions (Addendum)
 Medication Changes:  START: FARXIGA 10MG  ONCE DAILY   DECREASE TORSEMIDE  TO 20MG  ONCE DAILY   Lab Work:  Labs done today, your results will be available in MyChart, we will contact you for abnormal readings.  Follow-Up in: 4 MONTHS WITH DR. Julane Ny PLEASE CALL OUR OFFICE AROUND JULY TO GET SCHEDULED FOR YOUR APPOINTMENT. PHONE NUMBER IS [phone] OPTION 2   At the [facility], you and your health needs are our priority. We have a designated team specialized in the treatment of Heart Failure. This Care Team includes your primary Heart Failure Specialized Cardiologist (physician), Advanced Practice Providers (APPs- Physician Assistants and Nurse Practitioners), and Pharmacist who all work together to provide you with the care you need, when you need it.   You may see any of the following providers on your designated Care Team at your next follow up:  Dr. Jules Oar Dr. Peder Bourdon Dr. Alwin Baars Dr. Judyth Nunnery Nieves Bars, NP Ruddy Corral, [place] [facility] [place], [place] Dennise Fitz, NP [place] Lee, NP Luster Salters, PharmD   Please be sure to bring in all your medications bottles to every appointment.   Need to Contact Us :  If you have any questions or concerns before your next appointment please send us  a message through [place] or call our office at [phone].    TO LEAVE A MESSAGE FOR THE NURSE SELECT OPTION 2, PLEASE LEAVE A MESSAGE INCLUDING: YOUR NAME DATE OF BIRTH CALL BACK NUMBER REASON FOR CALL**this is important as we prioritize the call backs  YOU WILL RECEIVE A CALL BACK THE SAME DAY AS LONG AS YOU CALL BEFORE 4:00 PM

## 2024-04-15 NOTE — Progress Notes (Signed)
 Remote ICD transmission.

## 2024-04-16 ENCOUNTER — Encounter (HOSPITAL_COMMUNITY): Payer: Self-pay

## 2024-04-20 ENCOUNTER — Ambulatory Visit: Attending: Cardiology | Admitting: Cardiology

## 2024-04-20 ENCOUNTER — Encounter: Payer: Self-pay | Admitting: Cardiology

## 2024-04-20 VITALS — Ht 65.0 in | Wt 279.6 lb

## 2024-04-20 DIAGNOSIS — I10 Essential (primary) hypertension: Secondary | ICD-10-CM | POA: Diagnosis not present

## 2024-04-20 DIAGNOSIS — G4733 Obstructive sleep apnea (adult) (pediatric): Secondary | ICD-10-CM

## 2024-04-20 NOTE — Patient Instructions (Signed)
 Medication Instructions:  Your physician recommends that you continue on your current medications as directed. Please refer to the Current Medication list given to you today.  *If you need a refill on your cardiac medications before your next appointment, please call your pharmacy*  Lab Work: None.  If you have labs (blood work) drawn today and your tests are completely normal, you will receive your results only by: MyChart Message (if you have MyChart) OR A paper copy in the mail If you have any lab test that is abnormal or we need to change your treatment, we will call you to review the results.  Testing/Procedures: None.  Follow-Up: At [facility], you and your health needs are our priority.  As part of our continuing mission to provide you with exceptional heart care, our providers are all part of one team.  This team includes your primary Cardiologist (physician) and Advanced Practice Providers or APPs (Physician Assistants and Nurse Practitioners) who all work together to provide you with the care you need, when you need it.  Your next appointment:   1 year(s)  Provider:   Dr. Gaylyn Keas, MD     Other Instructions Dr. Micael Adas has ordered a new chin strap for you, your DME company will contact you about delivery/pick up.

## 2024-04-20 NOTE — Progress Notes (Addendum)
 Sleep Medicine virtual visit via Video Note   Because of Stephanie Gates's co-morbid illnesses, she is at least at moderate risk for complications without adequate follow up.  This format is felt to be most appropriate for this patient at this time.  All issues noted in this document were discussed and addressed.  A limited physical exam was performed with this format.  Please refer to the patient's chart for her consent to telehealth for [facility].      Date:  04/20/2024   ID:  Stephanie Gates, DOB November 30, 1966, MRN [id number] The patient was identified using 2 identifiers.  Patient Location: Home Provider Location: Home Office   PCP:  Laurence Pons, NP   [facility] Providers Cardiologist:  Jules Oar, MD Electrophysiologist:  Will Cortland Ding, MD     Evaluation Performed:  New Patient Evaluation  Chief Complaint:  OSA  History of Present Illness:    Stephanie Gates is a 58 y.o. female with a history of nonischemic cardiomyopathy with chronic HFrEF with EF less than 20% followed by advanced heart failure, severe MR/TR, hypertension and obesity.  She was complaining that she snored a lot at night and would wake up feeling tired and occasionally have to nap during the day.  She also was waking up with HAs in the am. Her Stop Bang score was 5.    Due to her risk factors for obstructive sleep apnea and her CHF a home sleep study was ordered.  This demonstrated overall mild obstructive sleep apnea with an AHI of 10.4/h with no central events but during REM sleep she had moderate obstructive sleep apnea with a REM AHI of 22.2 /h.  She also had nocturnal hypoxemia with O2 sats less than 88% for 8.2 minutes.  She was started on auto CPAP from 4 to 15 cm H2O.    She has been using her CPAP but still does not like it.  She tolerates the nasal mask but it dries out her nose and then her nose hurts. She feels the pressure is adequate.  Since  going on PAP she feels rested in the am and has no significant daytime sleepiness but will nap if she has not used her device the night before.  She is having both mouth and nasal dryness but no nasal congestion.  She does not use a chin strap with her mask. She does not think that she snores.     Past Medical History:  Diagnosis Date   Allergy    seasonal   Anemia    Arthritis    right hip   Gastritis    Headache    sinus/allergies   Hypertension    OSA on CPAP    overall mild obstructive sleep apnea with an AHI of 10.4/h with no central events but during REM sleep she had moderate obstructive sleep apnea with a REM AHI of 22.2 /h.  She also had nocturnal hypoxemia with O2 sats less than 88% for 8.2 minutes   Seasonal allergies    Vertigo    1-2x/month   Wears contact lenses    Past Surgical History:  Procedure Laterality Date   BIV ICD INSERTION CRT-D N/A 06/03/2023   Procedure: BIV ICD INSERTION CRT-D;  Surgeon: Lei Pump, MD;  Location: [facility] INVASIVE CV LAB;  Service: Cardiovascular;  Laterality: N/A;   COLONOSCOPY N/A 05/03/2015   Procedure: COLONOSCOPY;  Surgeon: Marnee Sink, MD;  Location: [facility] SURGERY CNTR;  Service: Gastroenterology;  Laterality: N/A;   COLONOSCOPY     COLONOSCOPY WITH PROPOFOL  N/A 02/22/2021   Procedure: COLONOSCOPY WITH PROPOFOL ;  Surgeon: Marnee Sink, MD;  Location: [facility] ENDOSCOPY;  Service: Endoscopy;  Laterality: N/A;   ESOPHAGOGASTRODUODENOSCOPY N/A 05/03/2015   Procedure: ESOPHAGOGASTRODUODENOSCOPY (EGD);  Surgeon: Marnee Sink, MD;  Location: [facility] SURGERY CNTR;  Service: Gastroenterology;  Laterality: N/A;   FOOT SURGERY  2014   Gastric bypass surgery     POLYPECTOMY  05/03/2015   Procedure: POLYPECTOMY INTESTINAL;  Surgeon: Marnee Sink, MD;  Location: [facility] SURGERY CNTR;  Service: Gastroenterology;;   RIGHT HEART CATH N/A 12/06/2022   Procedure: RIGHT HEART CATH;  Surgeon: Mardell Shade, MD;  Location: [facility] INVASIVE CV LAB;  Service:  Cardiovascular;  Laterality: N/A;   RIGHT/LEFT HEART CATH AND CORONARY ANGIOGRAPHY N/A 12/13/2022   Procedure: RIGHT/LEFT HEART CATH AND CORONARY ANGIOGRAPHY;  Surgeon: Darlis Eisenmenger, MD;  Location: [facility] INVASIVE CV LAB;  Service: Cardiovascular;  Laterality: N/A;     Current Meds  Medication Sig   acetaminophen  (TYLENOL ) 500 MG tablet Take 1,000 mg by mouth every 6 (six) hours as needed for moderate pain.   Cyanocobalamin (B-12) 3000 MCG CAPS Take 3,000 mcg by mouth daily.   dapagliflozin  propanediol (FARXIGA ) 10 MG TABS tablet Take 1 tablet (10 mg total) by mouth daily before breakfast.   famotidine  (PEPCID ) 20 MG tablet Take 1 tablet (20 mg total) by mouth 2 (two) times daily.   metoprolol  succinate (TOPROL -XL) 25 MG 24 hr tablet TAKE 1 TABLET(25 MG) BY MOUTH DAILY   Multiple Vitamin (MULTIVITAMIN) capsule Take 1 capsule by mouth daily.   OVER THE COUNTER MEDICATION Take 2 capsules by mouth at bedtime. Beauty Sleep gummies   Polyvinyl Alcohol-Povidone (REFRESH OP) Place 1 drop into both eyes daily as needed (dry eyes).   potassium chloride  SA (KLOR-CON  M) 20 MEQ tablet Take 1 tablet (20 mEq total) by mouth 2 (two) times daily.   sacubitril-valsartan (ENTRESTO ) 24-26 MG Take 1 tablet by mouth 2 (two) times daily.   spironolactone  (ALDACTONE ) 25 MG tablet Take 1 tablet (25 mg total) by mouth daily.   tirzepatide  (ZEPBOUND ) 7.5 MG/0.5ML Pen Inject 7.5 mg into the skin once a week.   torsemide  (DEMADEX ) 20 MG tablet Take 1 tablet (20 mg total) by mouth daily.     Allergies:   Bisoprolol  and Penicillins   Social History   Tobacco Use   Smoking status: Never   Smokeless tobacco: Never   Tobacco comments:    Never smoke 12/20/22  Vaping Use   Vaping status: Never Used  Substance Use Topics   Alcohol use: No   Drug use: No     Family Hx: The patient's family history includes Asthma in her mother; Cancer in her father; Diabetes in her father and mother; Heart failure in her mother;  Heart failure (age of onset: 62) in her brother; Hypertension in her father and mother. There is no history of Breast cancer.  ROS:   Please see the history of present illness.     All other systems reviewed and are negative.   Prior Sleep studies:   The following studies were reviewed today:  Home sleep study and PAP compliance download  Labs/Other Tests and Data Reviewed:     Recent Labs: 06/10/2023: ALT 33 04/13/2024: B Natriuretic Peptide 37.3; BUN 44; Creatinine, Ser 1.79; Hemoglobin 12.4; Platelets 236; Potassium 4.0; Sodium 138; TSH 2.025    Wt Readings from Last 3 Encounters:  04/20/24 279 lb 9.6 oz (126.8 kg)  04/13/24 283 lb 12.8 oz (128.7 kg)  01/22/24 287 lb 9.6 oz (130.5 kg)     Risk Assessment/Calculations:      STOP-Bang Score:         Objective:    Vital Signs:  Ht 5\' 5"  (1.651 m)   Wt 279 lb 9.6 oz (126.8 kg)   LMP 02/11/2017   BMI 46.53 kg/m    Well nourished, well developed female in no acute distress. Well appearing, alert and conversant, regular work of breathing,  good skin color  Eyes- anicteric mouth- oral mucosa is pink  neuro- grossly intact skin- no apparent rash or lesions or cyanosis ASSESSMENT & PLAN:    OSA - The patient is tolerating PAP therapy well without any problems. The PAP download performed by his DME was personally reviewed and interpreted by me today and showed an AHI of 1.3 /hr on auto CPAP 4-15 cm H2O with 17% compliance in using more than 4 hours nightly.  The patient has been using and benefiting from PAP use and will continue to benefit from therapy.  -encouraged her to be more compliant with her device -I will order her a chin strap to help with mouth dryness -I encouraged her to adjust the humidity in her device to help with nasal dryness  Hypertension - BP has been controlled at home - Continue Toprol -XL 25 mg daily, Entresto  24-26 mg twice daily and spironolactone  25 mg daily with as needed refills - I have  personally reviewed and interpreted outside labs performed by patient's PCP which showed serum creatinine 1.79 and potassium 4 on 04/13/2024  Time:   Today, I have spent 15 minutes with the patient with telehealth technology discussing the above problems.     Medication Adjustments/Labs and Tests Ordered: Current medicines are reviewed at length with the patient today.  Concerns regarding medicines are outlined above.   Tests Ordered: No orders of the defined types were placed in this encounter.   Medication Changes: No orders of the defined types were placed in this encounter.   Follow Up:  In Person in 1 year(s)  Signed, Gaylyn Keas, MD  04/20/2024 9:40 AM    [facility]

## 2024-04-21 ENCOUNTER — Ambulatory Visit (HOSPITAL_COMMUNITY): Payer: Self-pay

## 2024-04-21 ENCOUNTER — Other Ambulatory Visit (HOSPITAL_COMMUNITY): Payer: Self-pay

## 2024-04-21 DIAGNOSIS — I5022 Chronic systolic (congestive) heart failure: Secondary | ICD-10-CM

## 2024-04-21 MED ORDER — TORSEMIDE 20 MG PO TABS: 20.0000 mg | ORAL_TABLET | ORAL | Status: AC | PRN

## 2024-04-21 NOTE — Telephone Encounter (Addendum)
 Pt aware, agreeable, and verbalized understanding  Labs scheduled and ordered med list updated and iron  orders are in   ----- Message from Elmarie Hacking sent at 04/13/2024  1:24 PM EDT ----- Tsat and ferritin low, suspect iron  deficiency contributing to symptoms. Please arrange iron  infusion if she is agreeable  Renal function elevated, suggested over diuresis. Hold Entresto , spiro and Farxiga  x 3 days, then may resume at previous doses.  Change torsemide  to 20 mg PRN only. Repeat BMET in 10-14 days

## 2024-04-22 ENCOUNTER — Telehealth: Payer: Self-pay | Admitting: *Deleted

## 2024-04-22 DIAGNOSIS — I5022 Chronic systolic (congestive) heart failure: Secondary | ICD-10-CM

## 2024-04-22 DIAGNOSIS — I10 Essential (primary) hypertension: Secondary | ICD-10-CM

## 2024-04-22 DIAGNOSIS — R0683 Snoring: Secondary | ICD-10-CM

## 2024-04-22 DIAGNOSIS — G4733 Obstructive sleep apnea (adult) (pediatric): Secondary | ICD-10-CM

## 2024-04-22 DIAGNOSIS — R5383 Other fatigue: Secondary | ICD-10-CM

## 2024-04-22 NOTE — Telephone Encounter (Signed)
-----   Message from Gaylyn Keas sent at 04/22/2024  5:26 PM EDT ----- Regarding: RE: Issues with CPAP Mask Get an OV with the DME for picking a new mask and mask fitting ----- Message ----- From: Joslyn Nim, CMA Sent: 04/22/2024   5:13 PM EDT To: Jacqueline Matsu, MD Subject: FW: Issues with CPAP Mask                      It hurts her nose. She does not have a good fitting mask. ----- Message ----- From: Jacqueline Matsu, MD Sent: 04/13/2024  10:13 AM EDT To: Peggi Bowels, LPN; Joslyn Nim, CMA; # Subject: RE: Issues with CPAP Mask                      Please find out what issue patient is having with her mask ----- Message ----- From: Peggi Bowels, LPN Sent: 4/0/9811   9:26 AM EDT To: Jacqueline Matsu, MD; Joslyn Nim, CMA; # Subject: FW: Issues with CPAP Mask                      Please assist with this?  Thanks, Triage ----- Message ----- From: Deitra Fava, RN Sent: 04/13/2024   9:11 AM EDT To: [facility] Triage Subject: Issues with CPAP Mask                          Hi,   This patient was seen today in clinic here at the HF clinic. Patient reports having issues with CPAP mask as she is unable to tolerate this. Can someone from sleep please reach out to patient to assess issue? - patient may also be due for follow up with Dr. Micael Adas  Thank you  Jenna b. RN

## 2024-04-25 NOTE — Progress Notes (Signed)
 Cardiology Office Note:  .   Date:  04/27/2024  ID:  Stephanie Gates, DOB [birth year], MRN [id number] PCP: Laurence Pons, NP  [facility] Providers Cardiologist:  Jules Oar, MD Electrophysiologist:  Lei Pump, MD {  History of Present Illness: .   Stephanie Gates is a 58 y.o. female w/PMHx of HTN, morbid obesity s/p bariatric surgery, LBBB (130-140 ms), IDA, LBBB, OSA, and chronic HFrEF, OSA (on CPAP)  Admitted 12/23 with acute HF symptoms. Echo EF 20% with dyssynchrony, RV moderately down. Moderate to severe MR. Had CT chest. No PE. Subsequently developed AKI and inability to diurese. Advanced HF team consulted. Underwent L/RHC which showed markedly elevated filling pressures, low output with RV > LV failure, and normal coronaries.    Echo 03/06/23 EF 20% septal dyssynergy    cMRI: LVEF 28% RVEF 45% No LGE Mod TR Mild MR  Referred to EP for device consideration  CRT-D implant 06/03/23  Echo 08/19/23 EF 30-35%   She saw Dr. Bary Boss 08/19/23, ECG: AV paced 60 QRS  Dig stopped, ARB >> Entresto  Functional MR described as much improved on her echo done same day Some concern  that her device moved, CXR planned  (Personally reviewed) generator had moved inferiorly, leads appear stable in position  I saw her Nov 2024,  No further device concerns, since she mentioned it in Sept, does not think it has moved or flipped over any more No CP, palpitations Doing well Feel like she has a bit better stamina 97% BP No changes were made  Following regularly with HF team Most recently 04/13/24  feeling tired. On Zepbound  x 3 months and has not lost weight. She is SOB walking further distances on flat ground. Ankles are swelling. Some discomfort at ICD site Torsemide  dose reduced, started on Farxiga , planned for labs  Today's visit is scheduled as a 6 mo visit ROS:   She is doing well [place] SOB/DOE  waxes/wanes though remains much improved post device No CP, palpitations, cardiac awareness No rest SOB No near syncope or syncope   Device information MDT CRT-D implanted 06/03/23  Studies Reviewed: Aaron Aas    EKG done today and reviewed by myself SR, V paced 87bpm, QRS , morphology is good  DEVICE interrogation done today and reviewed by myself Battery and lead measurements are good A few VS episodes without EGMs, are brief and 1:1 by HRs No arrhythmias Effective BP 96.2%   Risk Assessment/Calculations:    Physical Exam:   VS:  BP 108/66   Pulse 97   Ht 5\' 5"  (1.651 m)   Wt 282 lb (127.9 kg)   LMP 02/11/2017   SpO2 95%   BMI 46.93 kg/m    Wt Readings from Last 3 Encounters:  04/27/24 282 lb (127.9 kg)  04/20/24 279 lb 9.6 oz (126.8 kg)  04/13/24 283 lb 12.8 oz (128.7 kg)    GEN: Well nourished, well developed in no acute distress NECK: No JVD; No carotid bruits CARDIAC: RRR, no murmurs, rubs, gallops RESPIRATORY:  CTA b/l without rales, wheezing or rhonchi  ABDOMEN: Soft, non-tender, non-distended EXTREMITIES:  No edema; No deformity   ICD site: is well healed, stable, no thinning, fluctuation, tethering  ASSESSMENT AND PLAN: .    ICD Intact function No programming changes made  NICM Chronic CHF LBBB No exam findings of voume OL  Optivol looks good  No symptoms or exam findings of volume oL C/w  Dr. Roena Clark  HTN Relative low BP with meds No symptoms   Dispo: will have her back in 83mo, sooner if needed  Signed, Debbie Fails, PA-C

## 2024-04-26 ENCOUNTER — Encounter: Payer: Self-pay | Admitting: Internal Medicine

## 2024-04-27 ENCOUNTER — Ambulatory Visit: Attending: Physician Assistant | Admitting: Physician Assistant

## 2024-04-27 ENCOUNTER — Encounter: Payer: Self-pay | Admitting: Physician Assistant

## 2024-04-27 VITALS — BP 108/66 | HR 85 | Ht 65.0 in | Wt 282.0 lb

## 2024-04-27 DIAGNOSIS — I428 Other cardiomyopathies: Secondary | ICD-10-CM

## 2024-04-27 DIAGNOSIS — I10 Essential (primary) hypertension: Secondary | ICD-10-CM | POA: Diagnosis not present

## 2024-04-27 DIAGNOSIS — I5022 Chronic systolic (congestive) heart failure: Secondary | ICD-10-CM

## 2024-04-27 DIAGNOSIS — Z95810 Presence of automatic (implantable) cardiac defibrillator: Secondary | ICD-10-CM

## 2024-04-27 NOTE — Patient Instructions (Signed)
 Medication Instructions:   Your physician recommends that you continue on your current medications as directed. Please refer to the Current Medication list given to you today.  *If you need a refill on your cardiac medications before your next appointment, please call your pharmacy*  Lab Work:  NONE ORDERED  TODAY   If you have labs (blood work) drawn today and your tests are completely normal, you will receive your results only by: MyChart Message (if you have MyChart) OR A paper copy in the mail If you have any lab test that is abnormal or we need to change your treatment, we will call you to review the results.  Testing/Procedures: NONE ORDERED  TODAY    Follow-Up: At [facility], you and your health needs are our priority.  As part of our continuing mission to provide you with exceptional heart care, our providers are all part of one team.  This team includes your primary Cardiologist (physician) and Advanced Practice Providers or APPs (Physician Assistants and Nurse Practitioners) who all work together to provide you with the care you need, when you need it.  Your next appointment:    6 month(s)   Provider:   You may see Will Cortland Ding, MD or one of the following Advanced Practice Providers on your designated Care Team:   Mertha Abrahams, [place]    We recommend signing up for the patient portal called "MyChart".  Sign up information is provided on this After Visit Summary.  MyChart is used to connect with patients for Virtual Visits (Telemedicine).  Patients are able to view lab/test results, encounter notes, upcoming appointments, etc.  Non-urgent messages can be sent to your provider as well.   To learn more about what you can do with MyChart, go to [email or web address].   Other Instructions

## 2024-04-29 ENCOUNTER — Other Ambulatory Visit: Payer: Self-pay

## 2024-04-29 ENCOUNTER — Other Ambulatory Visit: Payer: Self-pay | Admitting: Nurse Practitioner

## 2024-04-30 ENCOUNTER — Other Ambulatory Visit: Payer: Self-pay

## 2024-04-30 MED ORDER — ZEPBOUND 7.5 MG/0.5ML ~~LOC~~ SOAJ
7.5000 mg | SUBCUTANEOUS | 0 refills | Status: DC
  Filled 2024-04-30: qty 2, 28d supply, fill #0

## 2024-05-04 ENCOUNTER — Encounter: Payer: 59 | Admitting: Nurse Practitioner

## 2024-05-05 ENCOUNTER — Telehealth: Payer: Self-pay | Admitting: *Deleted

## 2024-05-05 ENCOUNTER — Ambulatory Visit (HOSPITAL_COMMUNITY)
Admission: RE | Admit: 2024-05-05 | Discharge: 2024-05-05 | Disposition: A | Discharge status: Home / Self Care | Source: Ambulatory Visit | Attending: Internal Medicine | Admitting: Internal Medicine

## 2024-05-05 ENCOUNTER — Ambulatory Visit (HOSPITAL_COMMUNITY): Payer: Self-pay | Admitting: Family Medicine

## 2024-05-05 ENCOUNTER — Telehealth (HOSPITAL_COMMUNITY): Payer: Self-pay

## 2024-05-05 DIAGNOSIS — I5022 Chronic systolic (congestive) heart failure: Secondary | ICD-10-CM | POA: Diagnosis present

## 2024-05-05 LAB — BASIC METABOLIC PANEL WITH GFR
Anion gap: 4 — ABNORMAL LOW (ref 5–15)
BUN: 14 mg/dL (ref 6–20)
CO2: 25 mmol/L (ref 22–32)
Calcium: 9.1 mg/dL (ref 8.9–10.3)
Chloride: 110 mmol/L (ref 98–111)
Creatinine, Ser: 1.41 mg/dL — ABNORMAL HIGH (ref 0.44–1.00)
GFR, Estimated: 43 mL/min — ABNORMAL LOW (ref >60)
Glucose, Bld: 85 mg/dL (ref 70–99)
Potassium: 4.3 mmol/L (ref 3.5–5.1)
Sodium: 139 mmol/L (ref 135–145)

## 2024-05-05 NOTE — Telephone Encounter (Signed)
 Patient here for lab work- inquiring about iron  infusion ordered by Vernia Good, advised patient I would forward message to pre cert to follow up on this.   Advised patient we would let her know and she verbalized understanding.

## 2024-05-05 NOTE — Telephone Encounter (Addendum)
 Alert received from [employer]:   Alert remote transmission: HF diagnostics currently abnormal Route to triage Follow up as scheduled.  _____________________________________________________________________________ Last Stephanie Gates 04/27/24. HF values WNL, but trending up as apparent on 5/28 report  RN called to assess patient  Patient confirmed experiencing general fatigue, SOB with activity, and some swelling in her legs  RN recommended, based on standing MD orders, and self reported symptoms by the patient, that she take her PRN Torsemide  x1 and continue to monitor her symptoms and weight over the next 2 days  Patient instructed to call clinic if symptoms don't improve, or go to ED if symptoms progress rapidly  Patient acknowledged understanding of instructions provided by this RN  Routing to Bensihmon, MD and Short, RN for awareness and review     04/27/24 values     05/05/24 values

## 2024-05-07 ENCOUNTER — Other Ambulatory Visit: Payer: Self-pay

## 2024-05-11 ENCOUNTER — Ambulatory Visit: Attending: Cardiology

## 2024-05-11 DIAGNOSIS — I5022 Chronic systolic (congestive) heart failure: Secondary | ICD-10-CM

## 2024-05-11 DIAGNOSIS — Z95810 Presence of automatic (implantable) cardiac defibrillator: Secondary | ICD-10-CM

## 2024-05-11 NOTE — Telephone Encounter (Signed)
Scheduled, Patient advised and verbalized understanding.

## 2024-05-13 ENCOUNTER — Telehealth: Payer: Self-pay

## 2024-05-13 ENCOUNTER — Other Ambulatory Visit (HOSPITAL_COMMUNITY): Payer: Self-pay | Admitting: Internal Medicine

## 2024-05-13 DIAGNOSIS — D509 Iron deficiency anemia, unspecified: Secondary | ICD-10-CM

## 2024-05-13 DIAGNOSIS — I5022 Chronic systolic (congestive) heart failure: Secondary | ICD-10-CM

## 2024-05-13 NOTE — Progress Notes (Signed)
 EPIC Encounter for ICM Monitoring  Patient Name: Stephanie Gates is a 58 y.o. female Date: 05/13/2024 Primary Care Physican: Laurence Pons, NP Primary Cardiologist: Bensimhon Electrophysiologist: Camnitz Bi-V Pacing: 96.9%     Follow up per device clinic abnormal Optivol.  Heart Failure questions reviewed.  Pt reports leg swelling and SOB has resolved since taking the PRN Torsemide  x 3 day.  She reports Torsemide  was changed to PRN from daily.  She started retaining fluid once she changed to PRN.   Optivol thoracic impedance suggesting possible fluid accumulation starting 5/13 after Torsemide  changed to PRN but returned to normal 6/3 after taking Torsemide  daily for 3 days.   Thoracic impedance has been stable this past year until 04/20/24.  Prescribed:  Torsemide  20 mg take 1 tablet(s) (20 mg total) by mouth as needed (changed from daily to PRN 5/9 by HF clinic).  Recommendations: Pt asked if she should resume Torsemide  daily instead of PRN and advised to call HF clinic for recommendation of when to take Torsemide  and she agreed.  Follow-up plan: No further ICM clinic phone appointments.   91 day device clinic remote transmission 06/02/2024.    EP/Cardiology Office Visits: 08/11/2024 with Dr. Bensimhon.  Recall 10/24/2024 with Dr Lawana Pray.    Copy of ICM check sent to Dr. Vanice Genre.   3 month ICM trend: 05/11/2024.    12-14 Month ICM trend:     Almyra Jain, RN 05/13/2024 1:37 PM

## 2024-05-13 NOTE — Telephone Encounter (Signed)
 Auth Submission: NO AUTH NEEDED Site of care: Site of care: [facility] Payer: [employer] commercial Medication & CPT/J Code(s) submitted: Venofer  (Iron  Sucrose) J1756 Route of submission (phone, fax, portal):  Phone # Fax # Auth type: Buy/Bill PB Units/visits requested: 200mg  x 5 doses Reference number:  Approval from: 05/13/24 to 09/12/24

## 2024-05-14 ENCOUNTER — Ambulatory Visit (HOSPITAL_COMMUNITY)
Admission: RE | Admit: 2024-05-14 | Discharge: 2024-05-14 | Disposition: A | Discharge status: Home / Self Care | Source: Ambulatory Visit | Attending: Internal Medicine | Admitting: Internal Medicine

## 2024-05-14 DIAGNOSIS — D509 Iron deficiency anemia, unspecified: Secondary | ICD-10-CM

## 2024-05-14 DIAGNOSIS — I5022 Chronic systolic (congestive) heart failure: Secondary | ICD-10-CM

## 2024-05-14 MED ORDER — IRON SUCROSE 200 MG IVPB - SIMPLE MED
200.0000 mg | Status: DC
  Administered 2024-05-14: 200 mg via INTRAVENOUS
  Filled 2024-05-14: qty 200

## 2024-05-17 ENCOUNTER — Telehealth: Payer: Self-pay

## 2024-05-17 ENCOUNTER — Ambulatory Visit (HOSPITAL_COMMUNITY)
Admission: RE | Admit: 2024-05-17 | Discharge: 2024-05-17 | Disposition: A | Discharge status: Home / Self Care | Source: Ambulatory Visit | Attending: Nurse Practitioner

## 2024-05-17 DIAGNOSIS — R5383 Other fatigue: Secondary | ICD-10-CM

## 2024-05-17 DIAGNOSIS — R0683 Snoring: Secondary | ICD-10-CM

## 2024-05-17 DIAGNOSIS — D509 Iron deficiency anemia, unspecified: Secondary | ICD-10-CM

## 2024-05-17 DIAGNOSIS — I447 Left bundle-branch block, unspecified: Secondary | ICD-10-CM

## 2024-05-17 DIAGNOSIS — I5022 Chronic systolic (congestive) heart failure: Secondary | ICD-10-CM

## 2024-05-17 DIAGNOSIS — I361 Nonrheumatic tricuspid (valve) insufficiency: Secondary | ICD-10-CM

## 2024-05-17 DIAGNOSIS — I10 Essential (primary) hypertension: Secondary | ICD-10-CM

## 2024-05-17 DIAGNOSIS — G4733 Obstructive sleep apnea (adult) (pediatric): Secondary | ICD-10-CM

## 2024-05-17 DIAGNOSIS — I340 Nonrheumatic mitral (valve) insufficiency: Secondary | ICD-10-CM

## 2024-05-17 DIAGNOSIS — Z95810 Presence of automatic (implantable) cardiac defibrillator: Secondary | ICD-10-CM

## 2024-05-17 DIAGNOSIS — I428 Other cardiomyopathies: Secondary | ICD-10-CM

## 2024-05-17 MED ORDER — IRON SUCROSE 200 MG IVPB - SIMPLE MED
200.0000 mg | Status: DC
  Administered 2024-05-17: 200 mg via INTRAVENOUS
  Filled 2024-05-17: qty 200

## 2024-05-17 NOTE — Telephone Encounter (Signed)
 Order for Stephanie Gates sent to [employer]

## 2024-05-17 NOTE — Telephone Encounter (Signed)
-----   Message from Gaylyn Keas sent at 04/20/2024  9:41 AM EDT ----- Please order a chinstrap for patient

## 2024-05-19 ENCOUNTER — Encounter (HOSPITAL_COMMUNITY)
Admission: RE | Admit: 2024-05-19 | Discharge: 2024-05-19 | Disposition: A | Discharge status: Home / Self Care | Source: Ambulatory Visit | Attending: Nurse Practitioner | Admitting: Nurse Practitioner

## 2024-05-19 DIAGNOSIS — I5022 Chronic systolic (congestive) heart failure: Secondary | ICD-10-CM | POA: Insufficient documentation

## 2024-05-19 DIAGNOSIS — D509 Iron deficiency anemia, unspecified: Secondary | ICD-10-CM | POA: Insufficient documentation

## 2024-05-19 MED ORDER — IRON SUCROSE 200 MG IVPB - SIMPLE MED
200.0000 mg | Status: DC
  Administered 2024-05-19: 200 mg via INTRAVENOUS
  Filled 2024-05-19: qty 200

## 2024-05-21 ENCOUNTER — Encounter (HOSPITAL_COMMUNITY)
Admission: RE | Admit: 2024-05-21 | Discharge: 2024-05-21 | Disposition: A | Discharge status: Home / Self Care | Source: Ambulatory Visit | Attending: Nurse Practitioner

## 2024-05-21 DIAGNOSIS — I5022 Chronic systolic (congestive) heart failure: Secondary | ICD-10-CM

## 2024-05-21 DIAGNOSIS — D509 Iron deficiency anemia, unspecified: Secondary | ICD-10-CM

## 2024-05-21 MED ORDER — IRON SUCROSE 200 MG IVPB - SIMPLE MED
200.0000 mg | Status: DC
  Administered 2024-05-21: 200 mg via INTRAVENOUS
  Filled 2024-05-21: qty 200

## 2024-05-24 ENCOUNTER — Encounter (HOSPITAL_COMMUNITY)
Admission: RE | Admit: 2024-05-24 | Discharge: 2024-05-24 | Disposition: A | Discharge status: Home / Self Care | Source: Ambulatory Visit | Attending: Nurse Practitioner | Admitting: Nurse Practitioner

## 2024-05-24 DIAGNOSIS — I5022 Chronic systolic (congestive) heart failure: Secondary | ICD-10-CM

## 2024-05-24 DIAGNOSIS — D509 Iron deficiency anemia, unspecified: Secondary | ICD-10-CM | POA: Diagnosis not present

## 2024-05-24 MED ORDER — IRON SUCROSE 200 MG IVPB - SIMPLE MED
200.0000 mg | Status: DC
  Filled 2024-05-24: qty 200

## 2024-06-02 ENCOUNTER — Ambulatory Visit (INDEPENDENT_AMBULATORY_CARE_PROVIDER_SITE_OTHER): Payer: 59

## 2024-06-02 DIAGNOSIS — I428 Other cardiomyopathies: Secondary | ICD-10-CM | POA: Diagnosis not present

## 2024-06-02 LAB — CUP PACEART REMOTE DEVICE CHECK
Battery Remaining Longevity: 117 mo
Battery Voltage: 3.01 V
Brady Statistic RV Percent Paced: 79.06 %
Date Time Interrogation Session: 20250624222628
HighPow Impedance: 74 Ohm
Lead Channel Impedance Value: 266 Ohm
Lead Channel Impedance Value: 342 Ohm
Lead Channel Impedance Value: 399 Ohm
Lead Channel Impedance Value: 399 Ohm
Lead Channel Impedance Value: 418 Ohm
Lead Channel Impedance Value: 418 Ohm
Lead Channel Impedance Value: 532 Ohm
Lead Channel Impedance Value: 570 Ohm
Lead Channel Impedance Value: 627 Ohm
Lead Channel Impedance Value: 646 Ohm
Lead Channel Impedance Value: 665 Ohm
Lead Channel Impedance Value: 760 Ohm
Lead Channel Impedance Value: 760 Ohm
Lead Channel Pacing Threshold Amplitude: 0.625 V
Lead Channel Pacing Threshold Amplitude: 0.75 V
Lead Channel Pacing Threshold Amplitude: 0.875 V
Lead Channel Pacing Threshold Pulse Width: 0.4 ms
Lead Channel Pacing Threshold Pulse Width: 0.4 ms
Lead Channel Pacing Threshold Pulse Width: 0.4 ms
Lead Channel Sensing Intrinsic Amplitude: 11.6 mV
Lead Channel Sensing Intrinsic Amplitude: 5.8 mV
Lead Channel Setting Pacing Amplitude: 1 V
Lead Channel Setting Pacing Amplitude: 1.5 V
Lead Channel Setting Pacing Amplitude: 1.5 V
Lead Channel Setting Pacing Pulse Width: 0.4 ms
Lead Channel Setting Pacing Pulse Width: 0.4 ms
Lead Channel Setting Sensing Sensitivity: 0.3 mV
Zone Setting Status: 755011
Zone Setting Status: 755011
Zone Setting Status: 755011

## 2024-06-06 ENCOUNTER — Ambulatory Visit: Payer: Self-pay | Admitting: Cardiology

## 2024-06-17 NOTE — Telephone Encounter (Signed)
 Patient has not received Chin strap from DME, [employer]. Sleep coordinator reached out to [employer], they will contact patient and expedite order.

## 2024-06-23 ENCOUNTER — Ambulatory Visit (INDEPENDENT_AMBULATORY_CARE_PROVIDER_SITE_OTHER): Admitting: Nurse Practitioner

## 2024-06-23 ENCOUNTER — Encounter: Payer: Self-pay | Admitting: Nurse Practitioner

## 2024-06-23 VITALS — BP 118/86 | HR 81 | Temp 98.1°F | Resp 16 | Ht 65.0 in | Wt 285.8 lb

## 2024-06-23 DIAGNOSIS — K5909 Other constipation: Secondary | ICD-10-CM | POA: Diagnosis not present

## 2024-06-23 DIAGNOSIS — R7303 Prediabetes: Secondary | ICD-10-CM | POA: Diagnosis not present

## 2024-06-23 DIAGNOSIS — I5022 Chronic systolic (congestive) heart failure: Secondary | ICD-10-CM | POA: Diagnosis not present

## 2024-06-23 DIAGNOSIS — E782 Mixed hyperlipidemia: Secondary | ICD-10-CM

## 2024-06-23 DIAGNOSIS — E538 Deficiency of other specified B group vitamins: Secondary | ICD-10-CM

## 2024-06-23 DIAGNOSIS — Z9189 Other specified personal risk factors, not elsewhere classified: Secondary | ICD-10-CM

## 2024-06-23 DIAGNOSIS — E559 Vitamin D deficiency, unspecified: Secondary | ICD-10-CM

## 2024-06-23 DIAGNOSIS — Z6841 Body Mass Index (BMI) 40.0 and over, adult: Secondary | ICD-10-CM

## 2024-06-23 DIAGNOSIS — R42 Dizziness and giddiness: Secondary | ICD-10-CM

## 2024-06-23 MED ORDER — WEGOVY 0.5 MG/0.5ML ~~LOC~~ SOAJ: 0.5000 mg | SUBCUTANEOUS | 5 refills | Status: DC

## 2024-06-23 NOTE — Progress Notes (Signed)
 [facility] [address] [place], [place] [address]  Internal MEDICINE  Office Visit Note  Patient Name: Stephanie Gates  [id number]  [id number]  Date of Service: 06/23/2024  Chief Complaint  Patient presents with   Acute Visit    Dizzy 2-3 weeks,stomach problem 2-3 weeks , bruising on both legs.      HPI Stephanie Gates presents for an acute sick visit for abdominal pain/cramping, constipation, skin discoloration on legs and dizziness.  --abdominal cramping, burning x2-3 weeks. Having loose stools at first and has gradually changed to constipation over the past month.  --Reports nausea, dizziness, and vertigo.  --Reports being started on farxiga  about a month ago and she also stopped taking zepbound  about 2 weeks ago.  --Constipation -- taking a probiotic which is helping some. Constipation seemed to be worse while taking zepbound  so this may continue to improve.  --also due for routine labs for upcoming CPE   Current Medication:  Outpatient Encounter Medications as of 06/23/2024  Medication Sig   Semaglutide -Weight Management (WEGOVY ) 0.5 MG/0.5ML SOAJ Inject 0.5 mg into the skin once a week.   acetaminophen  (TYLENOL ) 500 MG tablet Take 1,000 mg by mouth every 6 (six) hours as needed for moderate pain.   Cyanocobalamin (B-12) 3000 MCG CAPS Take 3,000 mcg by mouth daily.   dapagliflozin  propanediol (FARXIGA ) 10 MG TABS tablet Take 1 tablet (10 mg total) by mouth daily before breakfast.   famotidine  (PEPCID ) 20 MG tablet Take 1 tablet (20 mg total) by mouth 2 (two) times daily.   metoprolol  succinate (TOPROL -XL) 25 MG 24 hr tablet TAKE 1 TABLET(25 MG) BY MOUTH DAILY   Multiple Vitamin (MULTIVITAMIN) capsule Take 1 capsule by mouth daily.   OVER THE COUNTER MEDICATION Take 2 capsules by mouth at bedtime. Beauty Sleep gummies   Polyvinyl Alcohol-Povidone (REFRESH OP) Place 1 drop into both eyes daily as needed (dry eyes).   potassium chloride  SA (KLOR-CON  M) 20 MEQ tablet  Take 1 tablet (20 mEq total) by mouth 2 (two) times daily.   sacubitril-valsartan (ENTRESTO ) 24-26 MG Take 1 tablet by mouth 2 (two) times daily.   spironolactone  (ALDACTONE ) 25 MG tablet Take 1 tablet (25 mg total) by mouth daily.   torsemide  (DEMADEX ) 20 MG tablet Take 1 tablet (20 mg total) by mouth as needed. For weight gain of 3lb in 24 hours or 5 lb in a week   [DISCONTINUED] tirzepatide  (ZEPBOUND ) 7.5 MG/0.5ML Pen Inject 7.5 mg into the skin once a week.   No facility-administered encounter medications on file as of 06/23/2024.      Medical History: Past Medical History:  Diagnosis Date   Allergy    seasonal   Anemia    Arthritis    right hip   Gastritis    Headache    sinus/allergies   Hypertension    OSA on CPAP    overall mild obstructive sleep apnea with an AHI of 10.4/h with no central events but during REM sleep she had moderate obstructive sleep apnea with a REM AHI of 22.2 /h.  She also had nocturnal hypoxemia with O2 sats less than 88% for 8.2 minutes   Seasonal allergies    Vertigo    1-2x/month   Wears contact lenses      Vital Signs: BP 118/86   Pulse 81   Temp 98.1 F (36.7 C)   Resp 16   Ht 5' 5 (1.651 m)   Wt 285 lb 12.8 oz (129.6 kg)   LMP  02/11/2017   SpO2 98%   BMI 47.56 kg/m    Review of Systems  Constitutional:  Positive for fatigue. Negative for appetite change, chills and fever.  HENT:  Negative for congestion, ear pain, postnasal drip, rhinorrhea, sinus pressure, sinus pain, sneezing, sore throat, trouble swallowing and voice change.   Respiratory:  Positive for cough and shortness of breath. Negative for chest tightness and wheezing.   Cardiovascular:  Positive for leg swelling. Negative for chest pain and palpitations.  Gastrointestinal:  Positive for abdominal distention, abdominal pain, constipation and nausea. Negative for diarrhea (last week) and vomiting (from deep coughing).  Skin:  Positive for color change (on lower legs).   Neurological:  Positive for dizziness and light-headedness. Negative for headaches.    Physical Exam Vitals reviewed.  Constitutional:      General: She is not in acute distress.    Appearance: Normal appearance. She is obese. She is not ill-appearing.  HENT:     Head: Normocephalic and atraumatic.  Eyes:     Pupils: Pupils are equal, round, and reactive to light.  Cardiovascular:     Rate and Rhythm: Normal rate and regular rhythm.     Heart sounds: Normal heart sounds. No murmur heard. Pulmonary:     Effort: Pulmonary effort is normal. No accessory muscle usage or respiratory distress.     Breath sounds: Normal breath sounds. No wheezing or rales (improving).  Abdominal:     General: Bowel sounds are normal. There is distension.     Palpations: Abdomen is soft. There is no mass.     Tenderness: There is no abdominal tenderness. There is no guarding.     Hernia: No hernia is present.  Musculoskeletal:     Right lower leg: Edema present.     Left lower leg: Edema present.  Skin:    Capillary Refill: Capillary refill takes less than 2 seconds.  Neurological:     Mental Status: She is alert and oriented to person, place, and time.  Psychiatric:        Mood and Affect: Mood normal.        Behavior: Behavior normal.       Assessment/Plan: 1. Chronic systolic heart failure (HCC) (Primary) Discontinue zepbound  and switch to wegovy  which is indicated to decrease risk of CV events - Semaglutide -Weight Management (WEGOVY ) 0.5 MG/0.5ML SOAJ; Inject 0.5 mg into the skin once a week.  Dispense: 2 mL; Refill: 5  2. Mixed hyperlipidemia Routine labs ordered  - CMP14+EGFR - Hgb A1C w/o eAG - Lipid Profile  3. Pre-diabetes Routine labs ordered  - CMP14+EGFR - Hgb A1C w/o eAG - Lipid Profile  4. Other constipation Continue probiotics as discussed. Hopefully will continue to improve since she has stopped zepbound .   5. Dizziness Routine labs ordered  - CMP14+EGFR - Hgb  A1C w/o eAG - Lipid Profile - Vitamin D  (25 hydroxy) - B12 and Folate Panel  6. Vertigo Routine labs ordered  - Vitamin D  (25 hydroxy) - B12 and Folate Panel  7. B12 deficiency Routine labs ordered  - Vitamin D  (25 hydroxy) - B12 and Folate Panel  8. Vitamin D  deficiency Routine labs ordered  - Vitamin D  (25 hydroxy) - B12 and Folate Panel  9. Morbid obesity with BMI of 45.0-49.9, adult (HCC) Switch to wegovy   - Semaglutide -Weight Management (WEGOVY ) 0.5 MG/0.5ML SOAJ; Inject 0.5 mg into the skin once a week.  Dispense: 2 mL; Refill: 5  10. At risk for cardiovascular event Will switch  to wegovy  since this medication is also beneficial to reducing risk of CV events.  - Semaglutide -Weight Management (WEGOVY ) 0.5 MG/0.5ML SOAJ; Inject 0.5 mg into the skin once a week.  Dispense: 2 mL; Refill: 5   General Counseling: Fumiye verbalizes understanding of the findings of todays visit and agrees with plan of treatment. I have discussed any further diagnostic evaluation that may be needed or ordered today. We also reviewed her medications today. she has been encouraged to call the office with any questions or concerns that should arise related to todays visit.    Counseling:    Orders Placed This Encounter  Procedures   CMP14+EGFR   Hgb A1C w/o eAG   Lipid Profile   Vitamin D  (25 hydroxy)   B12 and Folate Panel    Meds ordered this encounter  Medications   Semaglutide -Weight Management (WEGOVY ) 0.5 MG/0.5ML SOAJ    Sig: Inject 0.5 mg into the skin once a week.    Dispense:  2 mL    Refill:  5    Discontinue zepbound , please send prior auth request for wegovy  asap. Switching medications due to heart disease and CV risk .    Return in about 1 month (around 07/24/2024) for CPE, Tereasa Yilmaz PCP, eval new med, and have labs done prior to visit. .  [place] Controlled Substance Database was reviewed by me for overdose risk score (ORS)  Time spent:30 Minutes Time spent with patient  included reviewing progress notes, labs, imaging studies, and discussing plan for follow up.   This patient was seen by Mardy Maxin, FNP-C in collaboration with Dr. Sigrid Bathe as a part of collaborative care agreement.  Telisa Ohlsen R. Maxin, MSN, FNP-C Internal Medicine

## 2024-07-14 ENCOUNTER — Telehealth: Payer: Self-pay | Admitting: Nurse Practitioner

## 2024-07-14 NOTE — Telephone Encounter (Signed)
 Left vm and sent mychart message to confirm 07/21/24 appointment-Toni

## 2024-07-17 LAB — B12 AND FOLATE PANEL
Folate: >20 ng/mL (ref >3.0)
Vitamin B-12: >2000 pg/mL — ABNORMAL HIGH (ref 232–1245)

## 2024-07-17 LAB — CMP14+EGFR
ALT: 17 IU/L (ref 0–32)
AST: 20 IU/L (ref 0–40)
Albumin: 3.9 g/dL (ref 3.8–4.9)
Alkaline Phosphatase: 123 IU/L — ABNORMAL HIGH (ref 44–121)
BUN/Creatinine Ratio: 18 (ref 9–23)
BUN: 27 mg/dL — ABNORMAL HIGH (ref 6–24)
Bilirubin Total: 0.4 mg/dL (ref 0.0–1.2)
CO2: 21 mmol/L (ref 20–29)
Calcium: 9.6 mg/dL (ref 8.7–10.2)
Chloride: 103 mmol/L (ref 96–106)
Creatinine, Ser: 1.5 mg/dL — ABNORMAL HIGH (ref 0.57–1.00)
Globulin, Total: 3.2 g/dL (ref 1.5–4.5)
Glucose: 79 mg/dL (ref 70–99)
Potassium: 4 mmol/L (ref 3.5–5.2)
Sodium: 140 mmol/L (ref 134–144)
Total Protein: 7.1 g/dL (ref 6.0–8.5)
eGFR: 40 mL/min/1.73 — ABNORMAL LOW (ref >59)

## 2024-07-17 LAB — LIPID PANEL
Chol/HDL Ratio: 3.3 ratio (ref 0.0–4.4)
Cholesterol, Total: 174 mg/dL (ref 100–199)
HDL: 52 mg/dL (ref >39)
LDL Chol Calc (NIH): 107 mg/dL — ABNORMAL HIGH (ref 0–99)
Triglycerides: 80 mg/dL (ref 0–149)
VLDL Cholesterol Cal: 15 mg/dL (ref 5–40)

## 2024-07-17 LAB — VITAMIN D 25 HYDROXY (VIT D DEFICIENCY, FRACTURES): Vit D, 25-Hydroxy: 23.5 ng/mL — ABNORMAL LOW (ref 30.0–100.0)

## 2024-07-17 LAB — HGB A1C W/O EAG: Hgb A1c MFr Bld: 5.5 % (ref 4.8–5.6)

## 2024-07-19 ENCOUNTER — Encounter: Payer: Self-pay | Admitting: Nurse Practitioner

## 2024-07-19 DIAGNOSIS — E782 Mixed hyperlipidemia: Secondary | ICD-10-CM | POA: Insufficient documentation

## 2024-07-19 DIAGNOSIS — I5022 Chronic systolic (congestive) heart failure: Secondary | ICD-10-CM | POA: Insufficient documentation

## 2024-07-19 DIAGNOSIS — Z9189 Other specified personal risk factors, not elsewhere classified: Secondary | ICD-10-CM | POA: Insufficient documentation

## 2024-07-21 ENCOUNTER — Encounter: Payer: Self-pay | Admitting: Nurse Practitioner

## 2024-07-21 ENCOUNTER — Ambulatory Visit: Admitting: Nurse Practitioner

## 2024-07-21 VITALS — BP 132/62 | HR 71 | Temp 98.1°F | Resp 16 | Ht 65.0 in | Wt 288.8 lb

## 2024-07-21 DIAGNOSIS — R200 Anesthesia of skin: Secondary | ICD-10-CM

## 2024-07-21 DIAGNOSIS — M25542 Pain in joints of left hand: Secondary | ICD-10-CM

## 2024-07-21 DIAGNOSIS — G4733 Obstructive sleep apnea (adult) (pediatric): Secondary | ICD-10-CM

## 2024-07-21 DIAGNOSIS — R7303 Prediabetes: Secondary | ICD-10-CM

## 2024-07-21 DIAGNOSIS — I10 Essential (primary) hypertension: Secondary | ICD-10-CM

## 2024-07-21 DIAGNOSIS — Z6841 Body Mass Index (BMI) 40.0 and over, adult: Secondary | ICD-10-CM

## 2024-07-21 DIAGNOSIS — N1832 Chronic kidney disease, stage 3b: Secondary | ICD-10-CM | POA: Diagnosis not present

## 2024-07-21 DIAGNOSIS — E559 Vitamin D deficiency, unspecified: Secondary | ICD-10-CM

## 2024-07-21 DIAGNOSIS — R202 Paresthesia of skin: Secondary | ICD-10-CM

## 2024-07-21 DIAGNOSIS — Z23 Encounter for immunization: Secondary | ICD-10-CM

## 2024-07-21 DIAGNOSIS — I5022 Chronic systolic (congestive) heart failure: Secondary | ICD-10-CM | POA: Diagnosis not present

## 2024-07-21 DIAGNOSIS — Z0001 Encounter for general adult medical examination with abnormal findings: Secondary | ICD-10-CM | POA: Diagnosis not present

## 2024-07-21 DIAGNOSIS — E782 Mixed hyperlipidemia: Secondary | ICD-10-CM | POA: Diagnosis not present

## 2024-07-21 DIAGNOSIS — E049 Nontoxic goiter, unspecified: Secondary | ICD-10-CM

## 2024-07-21 MED ORDER — SPIRONOLACTONE 25 MG PO TABS
25.0000 mg | ORAL_TABLET | Freq: Every day | ORAL | 3 refills | Status: AC
Start: 2024-07-21 — End: ?

## 2024-07-21 MED ORDER — ZOSTER VAC RECOMB ADJUVANTED 50 MCG/0.5ML IM SUSR: 0.5000 mL | Freq: Once | INTRAMUSCULAR | 1 refills | Status: AC | PRN

## 2024-07-21 MED ORDER — PNEUMOCOCCAL 20-VAL CONJ VACC 0.5 ML IM SUSY: 0.5000 mL | PREFILLED_SYRINGE | Freq: Once | INTRAMUSCULAR | 0 refills | Status: AC | PRN

## 2024-07-21 MED ORDER — VITAMIN D (ERGOCALCIFEROL) 1.25 MG (50000 UNIT) PO CAPS: 50000.0000 [IU] | ORAL_CAPSULE | ORAL | 1 refills | Status: AC

## 2024-07-21 NOTE — Progress Notes (Signed)
 [facility] [address] [place], [place] [address]  Internal MEDICINE  Office Visit Note  Patient Name: Stephanie Gates  [id number]  [id number]  Date of Service: 07/21/2024  Chief Complaint  Patient presents with   Hypertension   Annual Exam    HPI Tasmia presents for an annual well visit and physical exam.  Well-appearing 58 y.o. female with CHF, hypertension, OSA on CPAP, osteoarthritis, prediabetes, vitamin D  deficiency, and anemia. 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  Routine CRC screening: due for colonoscopy in 2029.  She does not have any family history of colon cancer. Routine mammogram: due in April next year  DEXA scan: not due yet  Pap smear: due now, will schedule follow up for this  Labs: results discussed with patient today. Vitamin D  is slightly low, A!C is now in normal range, LDL is slightly elevated at 107, the rest of the cholesterol panel is normal  New or worsening pain: none other than thumb and low back pain  Other concerns: none     Current Medication: Outpatient Encounter Medications as of 07/21/2024  Medication Sig   pneumococcal 20-valent conjugate vaccine (PREVNAR 20) 0.5 ML injection Inject 0.5 mLs into the muscle once as needed for up to 1 dose for immunization.   Vitamin D , Ergocalciferol , (DRISDOL ) 1.25 MG (50000 UNIT) CAPS capsule Take 1 capsule (50,000 Units total) by mouth every 7 (seven) days.   Zoster Vaccine Adjuvanted [facility]) injection Inject 0.5 mLs into the muscle once as needed for up to 1 dose.   acetaminophen  (TYLENOL ) 500 MG tablet Take 1,000 mg by mouth every 6 (six) hours as needed for moderate pain.   Cyanocobalamin (B-12) 3000 MCG CAPS Take 3,000 mcg by mouth daily.   dapagliflozin  propanediol (FARXIGA ) 10 MG TABS tablet Take 1 tablet (10 mg total) by mouth daily before breakfast.   famotidine  (PEPCID ) 20 MG tablet Take 1 tablet (20 mg total) by mouth 2 (two) times daily.   metoprolol  succinate  (TOPROL -XL) 25 MG 24 hr tablet TAKE 1 TABLET(25 MG) BY MOUTH DAILY   Multiple Vitamin (MULTIVITAMIN) capsule Take 1 capsule by mouth daily.   OVER THE COUNTER MEDICATION Take 2 capsules by mouth at bedtime. Beauty Sleep gummies   Polyvinyl Alcohol-Povidone (REFRESH OP) Place 1 drop into both eyes daily as needed (dry eyes).   potassium chloride  SA (KLOR-CON  M) 20 MEQ tablet Take 1 tablet (20 mEq total) by mouth 2 (two) times daily.   sacubitril-valsartan (ENTRESTO ) 24-26 MG Take 1 tablet by mouth 2 (two) times daily.   Semaglutide -Weight Management (WEGOVY ) 0.5 MG/0.5ML SOAJ Inject 0.5 mg into the skin once a week.   spironolactone  (ALDACTONE ) 25 MG tablet Take 1 tablet (25 mg total) by mouth daily.   torsemide  (DEMADEX ) 20 MG tablet Take 1 tablet (20 mg total) by mouth as needed. For weight gain of 3lb in 24 hours or 5 lb in a week   [DISCONTINUED] spironolactone  (ALDACTONE ) 25 MG tablet Take 1 tablet (25 mg total) by mouth daily.   No facility-administered encounter medications on file as of 07/21/2024.    Surgical History: Past Surgical History:  Procedure Laterality Date   BIV ICD INSERTION CRT-D N/A 06/03/2023   Procedure: BIV ICD INSERTION CRT-D;  Surgeon: Inocencio Soyla Lunger, MD;  Location: [facility] INVASIVE CV LAB;  Service: Cardiovascular;  Laterality: N/A;   COLONOSCOPY N/A 05/03/2015   Procedure: COLONOSCOPY;  Surgeon: Rogelia Copping, MD;  Location: [facility];  Service: Gastroenterology;  Laterality:  N/A;   COLONOSCOPY     COLONOSCOPY WITH PROPOFOL  N/A 02/22/2021   Procedure: COLONOSCOPY WITH PROPOFOL ;  Surgeon: Jinny Carmine, MD;  Location: [facility] ENDOSCOPY;  Service: Endoscopy;  Laterality: N/A;   ESOPHAGOGASTRODUODENOSCOPY N/A 05/03/2015   Procedure: ESOPHAGOGASTRODUODENOSCOPY (EGD);  Surgeon: Carmine Jinny, MD;  Location: [facility];  Service: Gastroenterology;  Laterality: N/A;   FOOT SURGERY  2014   Gastric bypass surgery     POLYPECTOMY  05/03/2015   Procedure:  POLYPECTOMY INTESTINAL;  Surgeon: Carmine Jinny, MD;  Location: [facility];  Service: Gastroenterology;;   RIGHT HEART CATH N/A 12/06/2022   Procedure: RIGHT HEART CATH;  Surgeon: Cherrie Toribio SAUNDERS, MD;  Location: [facility] INVASIVE CV LAB;  Service: Cardiovascular;  Laterality: N/A;   RIGHT/LEFT HEART CATH AND CORONARY ANGIOGRAPHY N/A 12/13/2022   Procedure: RIGHT/LEFT HEART CATH AND CORONARY ANGIOGRAPHY;  Surgeon: Rolan Ezra RAMAN, MD;  Location: [facility] INVASIVE CV LAB;  Service: Cardiovascular;  Laterality: N/A;    Medical History: Past Medical History:  Diagnosis Date   Allergy    seasonal   Anemia    Arthritis    right hip   Gastritis    Headache    sinus/allergies   Hypertension    OSA on CPAP    overall mild obstructive sleep apnea with an AHI of 10.4/h with no central events but during REM sleep she had moderate obstructive sleep apnea with a REM AHI of 22.2 /h.  She also had nocturnal hypoxemia with O2 sats less than 88% for 8.2 minutes   Seasonal allergies    Vertigo    1-2x/month   Wears contact lenses     Family History: Family History  Problem Relation Age of Onset   Asthma Mother    Heart failure Mother    Diabetes Mother    Hypertension Mother    Cancer Father    Diabetes Father    Hypertension Father    Heart failure Brother 80   Breast cancer Neg Hx     Social History   Socioeconomic History   Marital status: Single    Spouse name: Not on file   Number of children: 1   Years of education: Not on file   Highest education level: Bachelor's degree (e.g., BA, AB, BS)  Occupational History   Occupation: Health visitor  Tobacco Use   Smoking status: Never   Smokeless tobacco: Never   Tobacco comments:    Never smoke 12/20/22  Vaping Use   Vaping status: Never Used  Substance and Sexual Activity   Alcohol use: No   Drug use: No   Sexual activity: Not Currently  Other Topics Concern   Not on file  Social History Narrative   Not on file    Social Drivers of Health   Financial Resource Strain: Low Risk  (12/05/2022)   Overall Financial Resource Strain (CARDIA)    Difficulty of Paying Living Expenses: Not very hard  Food Insecurity: No Food Insecurity (12/16/2022)   Hunger Vital Sign    Worried About Running Out of Food in the Last Year: Never true    Ran Out of Food in the Last Year: Never true  Transportation Needs: No Transportation Needs (12/05/2022)   PRAPARE - Administrator, Civil Service (Medical): No    Lack of Transportation (Non-Medical): No  Physical Activity: Not on file  Stress: Not on file  Social Connections: Not on file  Intimate Partner Violence: Not on file  Review of Systems  Constitutional:  Positive for fatigue. Negative for activity change, appetite change, chills, fever and unexpected weight change.  HENT: Negative.  Negative for congestion, ear pain, rhinorrhea, sore throat and trouble swallowing.   Eyes: Negative.   Respiratory:  Positive for shortness of breath (intermittent). Negative for cough, chest tightness and wheezing.   Cardiovascular: Negative.  Negative for chest pain and palpitations.       Pacemaker placed last year  Gastrointestinal: Negative.  Negative for abdominal pain, blood in stool, constipation, diarrhea, nausea and vomiting.       Acid reflux not controlled  Endocrine: Negative.   Genitourinary: Negative.  Negative for difficulty urinating, dysuria, frequency, hematuria and urgency.  Musculoskeletal:  Positive for arthralgias (pain in the left thumb). Negative for back pain, joint swelling, myalgias and neck pain.  Skin: Negative.  Negative for rash and wound.  Allergic/Immunologic: Negative.  Negative for immunocompromised state.  Neurological:  Positive for numbness (and tingling in hands). Negative for dizziness, seizures and headaches.  Hematological: Negative.   Psychiatric/Behavioral: Negative.  Negative for behavioral problems, self-injury and  suicidal ideas. The patient is not nervous/anxious.     Vital Signs: BP 132/62   Pulse 71   Temp 98.1 F (36.7 C)   Resp 16   Ht 5' 5 (1.651 m)   Wt 288 lb 12.8 oz (131 kg)   LMP 02/11/2017   SpO2 99%   BMI 48.06 kg/m    Physical Exam Vitals reviewed.  Constitutional:      General: She is awake. She is not in acute distress.    Appearance: Normal appearance. She is well-developed and well-groomed. She is obese. She is not ill-appearing or diaphoretic.  HENT:     Head: Normocephalic and atraumatic.     Right Ear: Tympanic membrane, ear canal and external ear normal.     Left Ear: Tympanic membrane, ear canal and external ear normal.     Nose: Nose normal. No congestion or rhinorrhea.     Mouth/Throat:     Mouth: Mucous membranes are moist.     Pharynx: Oropharynx is clear. No oropharyngeal exudate or posterior oropharyngeal erythema.  Eyes:     General: Lids are normal. Vision grossly intact. Gaze aligned appropriately. No scleral icterus.       Right eye: No discharge.        Left eye: No discharge.     Extraocular Movements: Extraocular movements intact.     Conjunctiva/sclera: Conjunctivae normal.     Pupils: Pupils are equal, round, and reactive to light.     Funduscopic exam:    Right eye: Red reflex present.        Left eye: Red reflex present. Neck:     Thyroid : No thyromegaly.     Vascular: No JVD.     Trachea: Trachea and phonation normal. No tracheal deviation.  Cardiovascular:     Rate and Rhythm: Normal rate. Rhythm irregular.     Pulses: Normal pulses.          Dorsalis pedis pulses are 2+ on the right side and 2+ on the left side.       Posterior tibial pulses are 2+ on the right side and 2+ on the left side.     Heart sounds: S1 normal and S2 normal. Murmur heard.     No friction rub. No gallop.  Pulmonary:     Effort: Pulmonary effort is normal. No accessory muscle usage or respiratory distress.  Breath sounds: Normal breath sounds and air  entry. No stridor. No wheezing or rales.  Chest:     Chest wall: No tenderness.     Comments: Declined breast exam, gets annual mammograms.  Abdominal:     General: Bowel sounds are normal. There is no distension.     Palpations: Abdomen is soft. There is no shifting dullness, fluid wave, mass or pulsatile mass.     Tenderness: There is no abdominal tenderness. There is no guarding or rebound.  Musculoskeletal:        General: No tenderness or deformity.     Right hand: Decreased strength.     Left hand: Decreased range of motion. Decreased strength of thumb/finger opposition.     Cervical back: Normal range of motion and neck supple.     Right lower leg: No edema.     Left lower leg: No edema.     Right foot: Normal range of motion. No deformity, bunion, Charcot foot, foot drop or prominent metatarsal heads.     Left foot: Normal range of motion. No deformity, bunion, Charcot foot, foot drop or prominent metatarsal heads.  Feet:     Right foot:     Protective Sensation: 6 sites tested.  6 sites sensed.     Skin integrity: Callus and dry skin present. No ulcer, blister, skin breakdown, erythema, warmth or fissure.     Toenail Condition: Fungal disease present.    Left foot:     Protective Sensation: 6 sites tested.  6 sites sensed.     Skin integrity: Callus and dry skin present. No ulcer, blister, skin breakdown, erythema, warmth or fissure.     Toenail Condition: Fungal disease present. Lymphadenopathy:     Cervical: No cervical adenopathy.  Skin:    General: Skin is warm and dry.     Capillary Refill: Capillary refill takes less than 2 seconds.     Coloration: Skin is not pale.     Findings: No erythema or rash.  Neurological:     Mental Status: She is alert and oriented to person, place, and time.     Cranial Nerves: No cranial nerve deficit.     Motor: No abnormal muscle tone.     Coordination: Coordination normal.     Gait: Gait normal.     Deep Tendon Reflexes: Reflexes  are normal and symmetric.  Psychiatric:        Mood and Affect: Mood and affect normal.        Behavior: Behavior normal. Behavior is cooperative.        Thought Content: Thought content normal.        Judgment: Judgment normal.        Assessment/Plan: 1. Encounter for routine adult health examination with abnormal findings (Primary) Age-appropriate preventive screenings and vaccinations discussed, annual physical exam completed. Routine labs for health maintenance results discussed with the patient today. PHM updated.    2. Chronic systolic heart failure (HCC) Continue medications as prescribed, follow up with cardiology as instructed.  - spironolactone  (ALDACTONE ) 25 MG tablet; Take 1 tablet (25 mg total) by mouth daily.  Dispense: 90 tablet; Refill: 3  3. Essential (primary) hypertension Stable, continue medications as prescribed.   4. Mixed hyperlipidemia Continue dietary modifications as previously discussed, LDL slightly elevated, the rest of the panel was normal  5. Stage 3b chronic kidney disease (HCC) Repeat metabolic panel in a few months  - Basic Metabolic Panel (BMET)  6. Pre-diabetes Last A1c is  normal at 5.5   7. Nontoxic goiter, unspecified Periodically monitoring, no currently on any medication for this.   8. OSA on CPAP Managed by cardiology  9. Pain in thumb joint with movement of left hand Referred to orthopedic surgery for further evaluation  - Ambulatory referral to Orthopedic Surgery  10. Numbness and tingling in both hands Referred to orthopedic surgery for further evaluation  - Ambulatory referral to Orthopedic Surgery  11. Vitamin D  deficiency Continue weekly vitamin D  supplement as prescribed.  - Vitamin D , Ergocalciferol , (DRISDOL ) 1.25 MG (50000 UNIT) CAPS capsule; Take 1 capsule (50,000 Units total) by mouth every 7 (seven) days.  Dispense: 12 capsule; Refill: 1  12. Morbid obesity with BMI of 45.0-49.9, adult (HCC) Multiple comorbid  conditions, this does make regular exercise difficult due to activity intolerance and she does weight daily to monitor her fluid status. She was on zepbound  but switched last month to wegovy  for the cardiac benefits along with to help with continued weight loss.   13. Need for vaccination - Zoster Vaccine Adjuvanted [facility]) injection; Inject 0.5 mLs into the muscle once as needed for up to 1 dose.  Dispense: 0.5 mL; Refill: 1 - pneumococcal 20-valent conjugate vaccine (PREVNAR 20) 0.5 ML injection; Inject 0.5 mLs into the muscle once as needed for up to 1 dose for immunization.  Dispense: 0.5 mL; Refill: 0      General Counseling: Glorie verbalizes understanding of the findings of todays visit and agrees with plan of treatment. I have discussed any further diagnostic evaluation that may be needed or ordered today. We also reviewed her medications today. she has been encouraged to call the office with any questions or concerns that should arise related to todays visit.    Orders Placed This Encounter  Procedures   Basic Metabolic Panel (BMET)   Ambulatory referral to Orthopedic Surgery    Meds ordered this encounter  Medications   Vitamin D , Ergocalciferol , (DRISDOL ) 1.25 MG (50000 UNIT) CAPS capsule    Sig: Take 1 capsule (50,000 Units total) by mouth every 7 (seven) days.    Dispense:  12 capsule    Refill:  1   spironolactone  (ALDACTONE ) 25 MG tablet    Sig: Take 1 tablet (25 mg total) by mouth daily.    Dispense:  90 tablet    Refill:  3   Zoster Vaccine Adjuvanted [facility]) injection    Sig: Inject 0.5 mLs into the muscle once as needed for up to 1 dose.    Dispense:  0.5 mL    Refill:  1    Due for 2 dose series   pneumococcal 20-valent conjugate vaccine (PREVNAR 20) 0.5 ML injection    Sig: Inject 0.5 mLs into the muscle once as needed for up to 1 dose for immunization.    Dispense:  0.5 mL    Refill:  0    Due for prevnar 20    Return in about 1 month (around  08/21/2024) for F/U, Adriann Ballweg PCP.   Total time spent:30 Minutes Time spent includes review of chart, medications, test results, and follow up plan with the patient.   [place] Controlled Substance Database was reviewed by me.  This patient was seen by Mardy Maxin, FNP-C in collaboration with Dr. Sigrid Bathe as a part of collaborative care agreement.  Edson Deridder R. Maxin, MSN, FNP-C Internal medicine

## 2024-07-26 ENCOUNTER — Telehealth: Payer: Self-pay | Admitting: Nurse Practitioner

## 2024-07-26 NOTE — Telephone Encounter (Signed)
 Awaiting 07/21/24 office notes for Orthopedic referral-Toni

## 2024-08-13 ENCOUNTER — Encounter: Payer: Self-pay | Admitting: Nurse Practitioner

## 2024-08-13 DIAGNOSIS — N1832 Chronic kidney disease, stage 3b: Secondary | ICD-10-CM | POA: Insufficient documentation

## 2024-08-15 ENCOUNTER — Encounter: Payer: Self-pay | Admitting: Nurse Practitioner

## 2024-08-16 ENCOUNTER — Telehealth: Payer: Self-pay | Admitting: Nurse Practitioner

## 2024-08-16 NOTE — Telephone Encounter (Signed)
 Orthopedic referral sent via [facility] to [facility].  Lvm notifying  patient. Gave telephone# [phone]

## 2024-08-23 ENCOUNTER — Ambulatory Visit (INDEPENDENT_AMBULATORY_CARE_PROVIDER_SITE_OTHER): Admitting: Nurse Practitioner

## 2024-08-23 ENCOUNTER — Encounter: Payer: Self-pay | Admitting: Nurse Practitioner

## 2024-08-23 ENCOUNTER — Telehealth: Payer: Self-pay | Admitting: Nurse Practitioner

## 2024-08-23 VITALS — BP 102/60 | HR 73 | Temp 96.9°F | Resp 16 | Ht 65.0 in | Wt 285.4 lb

## 2024-08-23 DIAGNOSIS — R7303 Prediabetes: Secondary | ICD-10-CM | POA: Diagnosis not present

## 2024-08-23 DIAGNOSIS — N1832 Chronic kidney disease, stage 3b: Secondary | ICD-10-CM | POA: Diagnosis not present

## 2024-08-23 DIAGNOSIS — I10 Essential (primary) hypertension: Secondary | ICD-10-CM

## 2024-08-23 DIAGNOSIS — Z6841 Body Mass Index (BMI) 40.0 and over, adult: Secondary | ICD-10-CM

## 2024-08-23 MED ORDER — WEGOVY 1 MG/0.5ML ~~LOC~~ SOAJ
1.0000 mg | SUBCUTANEOUS | 5 refills | Status: AC
Start: 2024-08-23 — End: ?

## 2024-08-23 NOTE — Telephone Encounter (Signed)
 Per Niels with [facility], referral has been closed due to patient has unpaid balance -Andree

## 2024-08-23 NOTE — Progress Notes (Signed)
 [facility] [address] [place], [place] [address]  Internal MEDICINE  Office Visit Note  Patient Name: Stephanie Gates  [id number]  [id number]  Date of Service: 08/23/2024  Chief Complaint  Patient presents with   Hypertension   Follow-up    HPI Stephanie Gates presents for a follow-up visit for labs, weight loss, hypertension, prediabetes, and obesity.  Still needs to get labs done, patient reminded  Tolerating wegovy  ok, lost 3 lbs since last office visit in august.  Hypertension -- controlled with current medication Prediabetes -- on wegovy  for weight loss, and eating a low carb diet.  Stage 3B CKD -- patient is on farxiga  and torsemide      Current Medication: Outpatient Encounter Medications as of 08/23/2024  Medication Sig   semaglutide -weight management (WEGOVY ) 1 MG/0.5ML SOAJ SQ injection Inject 1 mg into the skin once a week.   acetaminophen  (TYLENOL ) 500 MG tablet Take 1,000 mg by mouth every 6 (six) hours as needed for moderate pain.   Cyanocobalamin (B-12) 3000 MCG CAPS Take 3,000 mcg by mouth daily.   dapagliflozin  propanediol (FARXIGA ) 10 MG TABS tablet Take 1 tablet (10 mg total) by mouth daily before breakfast.   famotidine  (PEPCID ) 20 MG tablet Take 1 tablet (20 mg total) by mouth 2 (two) times daily.   metoprolol  succinate (TOPROL -XL) 25 MG 24 hr tablet TAKE 1 TABLET(25 MG) BY MOUTH DAILY   Multiple Vitamin (MULTIVITAMIN) capsule Take 1 capsule by mouth daily.   OVER THE COUNTER MEDICATION Take 2 capsules by mouth at bedtime. Beauty Sleep gummies   pneumococcal 20-valent conjugate vaccine (PREVNAR 20) 0.5 ML injection Inject 0.5 mLs into the muscle once as needed for up to 1 dose for immunization.   Polyvinyl Alcohol-Povidone (REFRESH OP) Place 1 drop into both eyes daily as needed (dry eyes).   potassium chloride  SA (KLOR-CON  M) 20 MEQ tablet Take 1 tablet (20 mEq total) by mouth 2 (two) times daily.   sacubitril-valsartan (ENTRESTO ) 24-26 MG Take 1  tablet by mouth 2 (two) times daily.   spironolactone  (ALDACTONE ) 25 MG tablet Take 1 tablet (25 mg total) by mouth daily.   torsemide  (DEMADEX ) 20 MG tablet Take 1 tablet (20 mg total) by mouth as needed. For weight gain of 3lb in 24 hours or 5 lb in a week   Vitamin D , Ergocalciferol , (DRISDOL ) 1.25 MG (50000 UNIT) CAPS capsule Take 1 capsule (50,000 Units total) by mouth every 7 (seven) days.   Zoster Vaccine Adjuvanted [facility]) injection Inject 0.5 mLs into the muscle once as needed for up to 1 dose.   [DISCONTINUED] Semaglutide -Weight Management (WEGOVY ) 0.5 MG/0.5ML SOAJ Inject 0.5 mg into the skin once a week.   No facility-administered encounter medications on file as of 08/23/2024.    Surgical History: Past Surgical History:  Procedure Laterality Date   BIV ICD INSERTION CRT-D N/A 06/03/2023   Procedure: BIV ICD INSERTION CRT-D;  Surgeon: Inocencio Soyla Lunger, MD;  Location: [facility] INVASIVE CV LAB;  Service: Cardiovascular;  Laterality: N/A;   COLONOSCOPY N/A 05/03/2015   Procedure: COLONOSCOPY;  Surgeon: Rogelia Copping, MD;  Location: [facility] SURGERY CNTR;  Service: Gastroenterology;  Laterality: N/A;   COLONOSCOPY     COLONOSCOPY WITH PROPOFOL  N/A 02/22/2021   Procedure: COLONOSCOPY WITH PROPOFOL ;  Surgeon: Copping Rogelia, MD;  Location: [facility] ENDOSCOPY;  Service: Endoscopy;  Laterality: N/A;   ESOPHAGOGASTRODUODENOSCOPY N/A 05/03/2015   Procedure: ESOPHAGOGASTRODUODENOSCOPY (EGD);  Surgeon: Rogelia Copping, MD;  Location: [facility] SURGERY CNTR;  Service: Gastroenterology;  Laterality: N/A;  FOOT SURGERY  2014   Gastric bypass surgery     POLYPECTOMY  05/03/2015   Procedure: POLYPECTOMY INTESTINAL;  Surgeon: Rogelia Copping, MD;  Location: [facility] SURGERY CNTR;  Service: Gastroenterology;;   RIGHT HEART CATH N/A 12/06/2022   Procedure: RIGHT HEART CATH;  Surgeon: Cherrie Toribio SAUNDERS, MD;  Location: [facility] INVASIVE CV LAB;  Service: Cardiovascular;  Laterality: N/A;   RIGHT/LEFT HEART CATH AND CORONARY  ANGIOGRAPHY N/A 12/13/2022   Procedure: RIGHT/LEFT HEART CATH AND CORONARY ANGIOGRAPHY;  Surgeon: Rolan Ezra RAMAN, MD;  Location: [facility] INVASIVE CV LAB;  Service: Cardiovascular;  Laterality: N/A;    Medical History: Past Medical History:  Diagnosis Date   Allergy    seasonal   Anemia    Arthritis    right hip   Gastritis    Headache    sinus/allergies   Hypertension    OSA on CPAP    overall mild obstructive sleep apnea with an AHI of 10.4/h with no central events but during REM sleep she had moderate obstructive sleep apnea with a REM AHI of 22.2 /h.  She also had nocturnal hypoxemia with O2 sats less than 88% for 8.2 minutes   Seasonal allergies    Vertigo    1-2x/month   Wears contact lenses     Family History: Family History  Problem Relation Age of Onset   Asthma Mother    Heart failure Mother    Diabetes Mother    Hypertension Mother    Cancer Father    Diabetes Father    Hypertension Father    Heart failure Brother 33   Breast cancer Neg Hx     Social History   Socioeconomic History   Marital status: Single    Spouse name: Not on file   Number of children: 1   Years of education: Not on file   Highest education level: Bachelor's degree (e.g., BA, AB, BS)  Occupational History   Occupation: Health visitor  Tobacco Use   Smoking status: Never   Smokeless tobacco: Never   Tobacco comments:    Never smoke 12/20/22  Vaping Use   Vaping status: Never Used  Substance and Sexual Activity   Alcohol use: No   Drug use: No   Sexual activity: Not Currently  Other Topics Concern   Not on file  Social History Narrative   Not on file   Social Drivers of Health   Financial Resource Strain: Low Risk  (12/05/2022)   Overall Financial Resource Strain (CARDIA)    Difficulty of Paying Living Expenses: Not very hard  Food Insecurity: No Food Insecurity (12/16/2022)   Hunger Vital Sign    Worried About Running Out of Food in the Last Year: Never true    Ran Out  of Food in the Last Year: Never true  Transportation Needs: No Transportation Needs (12/05/2022)   PRAPARE - Administrator, Civil Service (Medical): No    Lack of Transportation (Non-Medical): No  Physical Activity: Not on file  Stress: Not on file  Social Connections: Not on file  Intimate Partner Violence: Not on file      Review of Systems  Constitutional:  Positive for fatigue. Negative for appetite change, chills and fever.  HENT:  Negative for congestion, ear pain, postnasal drip, rhinorrhea, sinus pressure, sinus pain, sneezing, sore throat, trouble swallowing and voice change.   Respiratory:  Positive for cough and shortness of breath. Negative for chest tightness and wheezing.   Cardiovascular:  Positive  for leg swelling. Negative for chest pain and palpitations.  Gastrointestinal:  Positive for abdominal distention, abdominal pain, constipation and nausea. Negative for diarrhea (last week) and vomiting (from deep coughing).  Skin:  Positive for color change (on lower legs).  Neurological:  Positive for dizziness and light-headedness. Negative for headaches.    Vital Signs: BP 102/60   Pulse 73   Temp (!) 96.9 F (36.1 C)   Resp 16   Ht 5' 5 (1.651 m)   Wt 285 lb 6.4 oz (129.5 kg)   LMP 02/11/2017   SpO2 99%   BMI 47.49 kg/m    Physical Exam Vitals reviewed.  Constitutional:      General: She is not in acute distress.    Appearance: Normal appearance. She is obese. She is not ill-appearing.  HENT:     Head: Normocephalic and atraumatic.  Eyes:     Pupils: Pupils are equal, round, and reactive to light.  Cardiovascular:     Rate and Rhythm: Normal rate and regular rhythm.     Heart sounds: Normal heart sounds. No murmur heard. Pulmonary:     Effort: Pulmonary effort is normal. No accessory muscle usage or respiratory distress.     Breath sounds: Normal breath sounds. No wheezing or rales (improving).  Abdominal:     General: Bowel sounds are  normal. There is distension.     Palpations: Abdomen is soft. There is no mass.     Tenderness: There is no abdominal tenderness. There is no guarding.     Hernia: No hernia is present.  Musculoskeletal:     Right lower leg: Edema present.     Left lower leg: Edema present.  Skin:    Capillary Refill: Capillary refill takes less than 2 seconds.  Neurological:     Mental Status: She is alert and oriented to person, place, and time.  Psychiatric:        Mood and Affect: Mood normal.        Behavior: Behavior normal.        Assessment/Plan: 1. Essential (primary) hypertension (Primary) Stable, Continue medications as prescribed.   2. Stage 3b chronic kidney disease (HCC) Continue farxiga  and torsemide  as prescribed   3. Pre-diabetes Continue wegovy  as prescribed.  - semaglutide -weight management (WEGOVY ) 1 MG/0.5ML SOAJ SQ injection; Inject 1 mg into the skin once a week.  Dispense: 2 mL; Refill: 5  4. Morbid obesity with body mass index of 45.0-49.9 in adult [facility]) Continue wegovy  as prescribed  - semaglutide -weight management (WEGOVY ) 1 MG/0.5ML SOAJ SQ injection; Inject 1 mg into the skin once a week.  Dispense: 2 mL; Refill: 5   General Counseling: Mardell verbalizes understanding of the findings of todays visit and agrees with plan of treatment. I have discussed any further diagnostic evaluation that may be needed or ordered today. We also reviewed her medications today. she has been encouraged to call the office with any questions or concerns that should arise related to todays visit.    No orders of the defined types were placed in this encounter.   Meds ordered this encounter  Medications   semaglutide -weight management (WEGOVY ) 1 MG/0.5ML SOAJ SQ injection    Sig: Inject 1 mg into the skin once a week.    Dispense:  2 mL    Refill:  5    Note increased dose, fill new script today, discontinue 0.5 mg dose    Return in about 3 months (around 11/22/2024) for F/U,  PAP ONLY,  Dioselina Brumbaugh PCP.   Total time spent:30 Minutes Time spent includes review of chart, medications, test results, and follow up plan with the patient.   [place] Controlled Substance Database was reviewed by me.  This patient was seen by Mardy Maxin, FNP-C in collaboration with Dr. Sigrid Bathe as a part of collaborative care agreement.   Kadra Kohan R. Maxin, MSN, FNP-C Internal medicine

## 2024-08-25 NOTE — Progress Notes (Signed)
 Remote ICD transmission.

## 2024-08-26 ENCOUNTER — Telehealth (HOSPITAL_COMMUNITY): Payer: Self-pay

## 2024-08-26 NOTE — Telephone Encounter (Signed)
 Received call from patient who states that she has a rash on her neck, breasts, and back, and side. Welts, and very itchy. Patient reports this started last night.   Patient reports that she started taking goli extra strength started 3 weeks ago.    Patient denies any changes to any medications. Denies any new detergents, soaps, perfumes, body sprays, or wash.   Patient denies any environmental factors.    Patient would like to know if she can take benadryl . Advised her that this is safe. Also advised patient to reach out to primary care provider for advice as well. She reports she will call them now.   Advised patient to call back to office with any issues, questions, or concerns. Patient verbalized understanding.

## 2024-08-31 ENCOUNTER — Encounter (HOSPITAL_BASED_OUTPATIENT_CLINIC_OR_DEPARTMENT_OTHER): Payer: Self-pay

## 2024-08-31 ENCOUNTER — Other Ambulatory Visit: Payer: Self-pay

## 2024-08-31 ENCOUNTER — Emergency Department (HOSPITAL_BASED_OUTPATIENT_CLINIC_OR_DEPARTMENT_OTHER)
Admission: EM | Admit: 2024-08-31 | Discharge: 2024-08-31 | Disposition: A | Discharge status: Home / Self Care | Attending: Emergency Medicine | Admitting: Emergency Medicine

## 2024-08-31 ENCOUNTER — Telehealth: Payer: Self-pay

## 2024-08-31 DIAGNOSIS — I10 Essential (primary) hypertension: Secondary | ICD-10-CM | POA: Diagnosis not present

## 2024-08-31 DIAGNOSIS — L299 Pruritus, unspecified: Secondary | ICD-10-CM | POA: Insufficient documentation

## 2024-08-31 DIAGNOSIS — Z79899 Other long term (current) drug therapy: Secondary | ICD-10-CM | POA: Diagnosis not present

## 2024-08-31 MED ORDER — PREDNISONE 10 MG PO TABS: 40.0000 mg | ORAL_TABLET | Freq: Every day | ORAL | 0 refills | Status: AC

## 2024-08-31 MED ORDER — HYDROXYZINE HCL 25 MG PO TABS: 25.0000 mg | ORAL_TABLET | Freq: Three times a day (TID) | ORAL | 0 refills | Status: AC

## 2024-08-31 NOTE — Discharge Instructions (Signed)
 You were evaluated in the emergency room for pruritus.  A prescription for prednisone  and hydroxyzine  was sent to your pharmacy.  If your symptoms persist please follow with your primary care doctor next week.  If you experience any new or worsening symptoms including diffuse urticaria, difficulties swallowing, breathing, swelling of your abdomen lips and tongue please return emergency room.

## 2024-08-31 NOTE — ED Provider Notes (Signed)
 [place] EMERGENCY DEPARTMENT AT [facility] [place] Provider Note   CSN: [id number] Arrival date & time: 08/31/24  1031     Patient presents with: Allergic Reaction   Stephanie Gates is a 58 y.o. female with history of hypertension, seasonal allergies presents with complaints of pruritus that started this past Friday.  Initially primarily involved her shoulders no is down to her torso.  Is not associate with any rash.  Denies any difficulty swallowing or breathing.  No new medications.  Denies any known trigger or exposures.  She does report that she had a similar experience a few years ago.  It eventually resolved.  She has tried Benadryl  without any significant improvement.    Allergic Reaction Presenting symptoms: no rash       Past Medical History:  Diagnosis Date   Allergy    seasonal   Anemia    Arthritis    right hip   Gastritis    Headache    sinus/allergies   Hypertension    OSA on CPAP    overall mild obstructive sleep apnea with an AHI of 10.4/h with no central events but during REM sleep she had moderate obstructive sleep apnea with a REM AHI of 22.2 /h.  She also had nocturnal hypoxemia with O2 sats less than 88% for 8.2 minutes   Seasonal allergies    Vertigo    1-2x/month   Wears contact lenses    Past Surgical History:  Procedure Laterality Date   BIV ICD INSERTION CRT-D N/A 06/03/2023   Procedure: BIV ICD INSERTION CRT-D;  Surgeon: Inocencio Soyla Lunger, MD;  Location: [facility] INVASIVE CV LAB;  Service: Cardiovascular;  Laterality: N/A;   COLONOSCOPY N/A 05/03/2015   Procedure: COLONOSCOPY;  Surgeon: Rogelia Copping, MD;  Location: [facility] SURGERY CNTR;  Service: Gastroenterology;  Laterality: N/A;   COLONOSCOPY     COLONOSCOPY WITH PROPOFOL  N/A 02/22/2021   Procedure: COLONOSCOPY WITH PROPOFOL ;  Surgeon: Copping Rogelia, MD;  Location: [facility] ENDOSCOPY;  Service: Endoscopy;  Laterality: N/A;   ESOPHAGOGASTRODUODENOSCOPY N/A 05/03/2015   Procedure:  ESOPHAGOGASTRODUODENOSCOPY (EGD);  Surgeon: Rogelia Copping, MD;  Location: [facility] SURGERY CNTR;  Service: Gastroenterology;  Laterality: N/A;   FOOT SURGERY  2014   Gastric bypass surgery     POLYPECTOMY  05/03/2015   Procedure: POLYPECTOMY INTESTINAL;  Surgeon: Rogelia Copping, MD;  Location: [facility] SURGERY CNTR;  Service: Gastroenterology;;   RIGHT HEART CATH N/A 12/06/2022   Procedure: RIGHT HEART CATH;  Surgeon: Cherrie Toribio SAUNDERS, MD;  Location: [facility] INVASIVE CV LAB;  Service: Cardiovascular;  Laterality: N/A;   RIGHT/LEFT HEART CATH AND CORONARY ANGIOGRAPHY N/A 12/13/2022   Procedure: RIGHT/LEFT HEART CATH AND CORONARY ANGIOGRAPHY;  Surgeon: Rolan Ezra RAMAN, MD;  Location: [facility] INVASIVE CV LAB;  Service: Cardiovascular;  Laterality: N/A;     Prior to Admission medications   Medication Sig Start Date End Date Taking? Authorizing Provider  hydrOXYzine  (ATARAX ) 25 MG tablet Take 1 tablet (25 mg total) by mouth 3 (three) times daily. 08/31/24  Yes Donnajean Lynwood DEL, PA-C  predniSONE  (DELTASONE ) 10 MG tablet Take 4 tablets (40 mg total) by mouth daily. 08/31/24  Yes Donnajean Lynwood DEL, PA-C  acetaminophen  (TYLENOL ) 500 MG tablet Take 1,000 mg by mouth every 6 (six) hours as needed for moderate pain.    [provider]  Cyanocobalamin (B-12) 3000 MCG CAPS Take 3,000 mcg by mouth daily.    [provider]  dapagliflozin  propanediol (FARXIGA ) 10 MG TABS tablet Take 1 tablet (10  mg total) by mouth daily before breakfast. 04/13/24   Milford, Harlene HERO, FNP  famotidine  (PEPCID ) 20 MG tablet Take 1 tablet (20 mg total) by mouth 2 (two) times daily. 06/06/23   Franklyn Sid SAILOR, MD  metoprolol  succinate (TOPROL -XL) 25 MG 24 hr tablet TAKE 1 TABLET(25 MG) BY MOUTH DAILY 03/11/24   Bensimhon, Daniel R, MD  Multiple Vitamin (MULTIVITAMIN) capsule Take 1 capsule by mouth daily.    [provider]  OVER THE COUNTER MEDICATION Take 2 capsules by mouth at bedtime. Beauty Sleep gummies    [provider]  pneumococcal 20-valent conjugate vaccine (PREVNAR 20) 0.5 ML injection Inject 0.5 mLs into the muscle once as needed for up to 1 dose for immunization. 07/21/24   Abernathy, Alyssa, NP  Polyvinyl Alcohol-Povidone (REFRESH OP) Place 1 drop into both eyes daily as needed (dry eyes).    [provider]  potassium chloride  SA (KLOR-CON  M) 20 MEQ tablet Take 1 tablet (20 mEq total) by mouth 2 (two) times daily. 11/04/23   Milford, Harlene HERO, FNP  sacubitril-valsartan (ENTRESTO ) 24-26 MG Take 1 tablet by mouth 2 (two) times daily. 08/19/23   Bensimhon, Toribio SAUNDERS, MD  semaglutide -weight management (WEGOVY ) 1 MG/0.5ML SOAJ SQ injection Inject 1 mg into the skin once a week. 08/23/24   Liana Fish, NP  spironolactone  (ALDACTONE ) 25 MG tablet Take 1 tablet (25 mg total) by mouth daily. 07/21/24   Liana Fish, NP  torsemide  (DEMADEX ) 20 MG tablet Take 1 tablet (20 mg total) by mouth as needed. For weight gain of 3lb in 24 hours or 5 lb in a week 04/21/24   Milford, Harlene HERO, FNP  Vitamin D , Ergocalciferol , (DRISDOL ) 1.25 MG (50000 UNIT) CAPS capsule Take 1 capsule (50,000 Units total) by mouth every 7 (seven) days. 07/21/24   Liana Fish, NP  Zoster Vaccine Adjuvanted [facility]) injection Inject 0.5 mLs into the muscle once as needed for up to 1 dose. 07/21/24   Liana Fish, NP    Allergies: Bisoprolol  and Penicillins    Review of Systems  Skin:  Negative for rash.    Updated Vital Signs BP 139/71 (BP Location: Right Arm)   Pulse 77   Temp 98.4 F (36.9 C) (Oral)   Resp 16   Wt 127.9 kg   LMP 02/11/2017   SpO2 100%   BMI 46.93 kg/m   Physical Exam Vitals and nursing note reviewed.  Constitutional:      General: She is not in acute distress.    Appearance: She is well-developed.  HENT:     Head: Normocephalic and atraumatic.  Eyes:     Conjunctiva/sclera: Conjunctivae normal.  Cardiovascular:     Rate and Rhythm: Normal rate and regular rhythm.      Heart sounds: No murmur heard. Pulmonary:     Effort: Pulmonary effort is normal. No respiratory distress.     Breath sounds: Normal breath sounds.  Abdominal:     Palpations: Abdomen is soft.     Tenderness: There is no abdominal tenderness.  Musculoskeletal:        General: No swelling.     Cervical back: Neck supple.  Skin:    General: Skin is warm and dry.     Capillary Refill: Capillary refill takes less than 2 seconds.     Coloration: Skin is not jaundiced.     Findings: No erythema, lesion or rash.  Neurological:     Mental Status: She is alert.  Psychiatric:  Mood and Affect: Mood normal.     (all labs ordered are listed, but only abnormal results are displayed) Labs Reviewed - No data to display  EKG: None  Radiology: No results found.   Procedures   Medications Ordered in the ED - No data to display  Clinical Course as of 08/31/24 1234  Tue Aug 31, 2024  1230 Patient evaluated for complaints of pruritus without any rash or other signs of allergic reaction for the past few days.  Patient is hemodynamically stable.  On exam patient's airway is patent without any angioedema, no rash or lesions noted.  Her lungs are clear without any stridor.  Etiology unclear at this time.  Appears that patient has been worked up for this in the past.  Discussed with patient possibly ordering lab work for further evaluation here in the ED versus discharge home with steroids, hydroxyzine  and PCP follow-up.  Patient would prefer the latter.  Believe that this is reasonable.  She appears stable at this time for discharge.  Strict return precautions provided. [JT]    Clinical Course User Index [JT] Donnajean Lynwood DEL, PA-C                                 Medical Decision Making  This patient presents to the ED with chief complaint(s) of pruritis.  The complaint involves an extensive differential diagnosis and also carries with it a high risk of complications and morbidity.    Pertinent past medical history as listed in HPI  The differential diagnosis includes  Considered systemic conditions such as uremia, leukemia, polycythemia vera.  However patient is without any other systemic symptoms.  Her exam is not consistent with an acute allergic reaction.  Additional history obtained: Records reviewed Care Everywhere/External Records  Disposition:   Patient will be discharged home. The patient has been appropriately medically screened and/or stabilized in the ED. I have low suspicion for any other emergent medical condition which would require further screening, evaluation or treatment in the ED or require inpatient management. At time of discharge the patient is hemodynamically stable and in no acute distress. I have discussed work-up results and diagnosis with patient and answered all questions. Patient is agreeable with discharge plan. We discussed strict return precautions for returning to the emergency department and they verbalized understanding.     Social Determinants of Health:   none  This note was dictated with voice recognition software.  Despite best efforts at proofreading, errors may have occurred which can change the documentation meaning.       Final diagnoses:  Pruritus    ED Discharge Orders          Ordered    predniSONE  (DELTASONE ) 10 MG tablet  Daily        08/31/24 1234    hydrOXYzine  (ATARAX ) 25 MG tablet  3 times daily        08/31/24 1234               Lyn Deemer H, PA-C 08/31/24 1234    Freddi Hamilton, MD 08/31/24 1438

## 2024-08-31 NOTE — Telephone Encounter (Signed)
 Patient called stating since Friday she is having a allergic reaction she thinks, as she is having swelling and itching all over and swelling neck, patient stated nothing is new as in lotion, body wash or laundry detergent, Spoke to Alyssa as since we don't have any opening today patient should go to urgent care to be seen. Patient was notified.

## 2024-08-31 NOTE — ED Triage Notes (Signed)
 Pt reports allergic reaction due to itching all over her body since Friday. Itching came on suddenly. Unsure what has caused reaction. NO difficulty swallowing or swelling noted in throat. Hives last 3 day. No hives noted in triage Felt swelling under jawline and breast area  Reports last incident was last when she had pacemaker placed. No allergen found after going to dermatologist

## 2024-09-01 ENCOUNTER — Ambulatory Visit: Payer: 59

## 2024-09-01 DIAGNOSIS — I428 Other cardiomyopathies: Secondary | ICD-10-CM

## 2024-09-02 LAB — CUP PACEART REMOTE DEVICE CHECK
Battery Remaining Longevity: 114 mo
Battery Voltage: 3 V
Brady Statistic RV Percent Paced: 78.94 %
Date Time Interrogation Session: 20250923204830
HighPow Impedance: 75 Ohm
Lead Channel Impedance Value: 285 Ohm
Lead Channel Impedance Value: 342 Ohm
Lead Channel Impedance Value: 380 Ohm
Lead Channel Impedance Value: 437 Ohm
Lead Channel Impedance Value: 437 Ohm
Lead Channel Impedance Value: 437 Ohm
Lead Channel Impedance Value: 532 Ohm
Lead Channel Impedance Value: 570 Ohm
Lead Channel Impedance Value: 608 Ohm
Lead Channel Impedance Value: 646 Ohm
Lead Channel Impedance Value: 646 Ohm
Lead Channel Impedance Value: 741 Ohm
Lead Channel Impedance Value: 741 Ohm
Lead Channel Pacing Threshold Amplitude: 0.75 V
Lead Channel Pacing Threshold Amplitude: 0.75 V
Lead Channel Pacing Threshold Amplitude: 0.875 V
Lead Channel Pacing Threshold Pulse Width: 0.4 ms
Lead Channel Pacing Threshold Pulse Width: 0.4 ms
Lead Channel Pacing Threshold Pulse Width: 0.4 ms
Lead Channel Sensing Intrinsic Amplitude: 12.9 mV
Lead Channel Sensing Intrinsic Amplitude: 5.9 mV
Lead Channel Setting Pacing Amplitude: 1.25 V
Lead Channel Setting Pacing Amplitude: 1.5 V
Lead Channel Setting Pacing Amplitude: 1.5 V
Lead Channel Setting Pacing Pulse Width: 0.4 ms
Lead Channel Setting Pacing Pulse Width: 0.4 ms
Lead Channel Setting Sensing Sensitivity: 0.3 mV
Zone Setting Status: 755011
Zone Setting Status: 755011
Zone Setting Status: 755011

## 2024-09-03 ENCOUNTER — Ambulatory Visit: Payer: Self-pay | Admitting: Cardiology

## 2024-09-03 NOTE — Progress Notes (Signed)
Remote ICD Transmission.

## 2024-09-15 ENCOUNTER — Other Ambulatory Visit (HOSPITAL_COMMUNITY): Payer: Self-pay | Admitting: Internal Medicine

## 2024-09-20 ENCOUNTER — Other Ambulatory Visit (HOSPITAL_COMMUNITY): Payer: Self-pay | Admitting: Cardiology

## 2024-09-20 ENCOUNTER — Other Ambulatory Visit (HOSPITAL_COMMUNITY): Payer: Self-pay

## 2024-09-20 DIAGNOSIS — I5022 Chronic systolic (congestive) heart failure: Secondary | ICD-10-CM

## 2024-09-20 MED ORDER — DAPAGLIFLOZIN PROPANEDIOL 10 MG PO TABS: 10.0000 mg | ORAL_TABLET | Freq: Every day | ORAL | 5 refills | Status: DC

## 2024-09-20 MED ORDER — DAPAGLIFLOZIN PROPANEDIOL 10 MG PO TABS: 10.0000 mg | ORAL_TABLET | Freq: Every day | ORAL | 3 refills | Status: AC

## 2024-09-20 MED ORDER — SACUBITRIL-VALSARTAN 24-26 MG PO TABS: 1.0000 | ORAL_TABLET | Freq: Two times a day (BID) | ORAL | 3 refills | Status: AC

## 2024-09-29 ENCOUNTER — Telehealth (HOSPITAL_COMMUNITY): Payer: Self-pay

## 2024-09-29 NOTE — Telephone Encounter (Signed)
 Called to confirm/remind patient of their appointment [facility] on 09/30/24.   Appointment:   [x] Confirmed  [] Left mess   [] No answer/No voice mail  [] VM Full/unable to leave message  [] Phone not in service  Patient reminded to bring all medications and/or complete list.  Confirmed patient has transportation. Gave directions, instructed to utilize valet parking.

## 2024-09-29 NOTE — Progress Notes (Signed)
 ADVANCED HF CLINIC NOTE PCP: Liana Fish, NP HF Cardiologist: Toribio Fuel, MD  HPI: Stephanie [facility] is a 58 y.o. woman with HTN, morbid obesity s/p bariatric surgery, LBBB (130-140 Stephanie), IDA, OSA, and chronic HFrEF.    Admitted 12/23 with acute HF symptoms. Echo EF 20% with dyssynchrony, RV moderately down. Moderate to severe MR. Had CT chest. No PE. Subsequently developed AKI and inability to diurese. Advanced HF team consulted. Underwent L/RHC which showed markedly elevated filling pressures, low output with RV > LV failure, and normal coronaries.   Underwent CRT-D 6/24  Echo 9/24: EF 35-40%, normal RV.  Today she returns for AHF follow up. Overall feeling ok. Denies palpitations, CP, dizziness, edema, or PND/Orthopnea. No SOB. Appetite too good, tries to watch what she eats. No fever or chills. Weight at home 282 pounds. Taking all medications. Denies ETOH, tobacco or drug use. Was out of Entresto  for 2 weeks but back on over the last week. Takes Torsemide  twice a week when she notices her weight go up.   Cardiac Studies: - Echo 9/24: EF 35-40%, normal RV - Echo (12/23): EF <20%, G2DD, mildly reduced RV, Mod/sev MR, sev TR - RHC (12/23): RA 22, PA 49/26 (33), PCWP 23, CO/CI (thermo) 4.1/1.7, PVR 2.5, PAPi 1  - R/LHC (1/24): RA 4, PA 32/16 (23), CO/CI (thermo) 5.9/2.67, PVR 2.5, PAPi 4 - Echo (3/24): EF 20% septal dyssynergy  - cMRI (6/24): LVEF 28%, RVEF 45%, No LGE, Mod TR, Mild MR - Echo (9/24): EF 30-35%, RV nl, mild/mod MR, no TR  ROS: All systems negative except as listed in HPI, PMH and Problem List.  SH:  Social History   Socioeconomic History   Marital status: Single    Spouse name: Not on file   Number of children: 1   Years of education: Not on file   Highest education level: Bachelor's degree (e.g., BA, AB, BS)  Occupational History   Occupation: Health visitor  Tobacco Use   Smoking status: Never   Smokeless tobacco: Never   Tobacco comments:     Never smoke 12/20/22  Vaping Use   Vaping status: Never Used  Substance and Sexual Activity   Alcohol use: No   Drug use: No   Sexual activity: Not Currently  Other Topics Concern   Not on file  Social History Narrative   Not on file   Social Drivers of Health   Financial Resource Strain: Low Risk  (12/05/2022)   Overall Financial Resource Strain (CARDIA)    Difficulty of Paying Living Expenses: Not very hard  Food Insecurity: No Food Insecurity (12/16/2022)   Hunger Vital Sign    Worried About Running Out of Food in the Last Year: Never true    Ran Out of Food in the Last Year: Never true  Transportation Needs: No Transportation Needs (12/05/2022)   PRAPARE - Administrator, Civil Service (Medical): No    Lack of Transportation (Non-Medical): No  Physical Activity: Not on file  Stress: Not on file  Social Connections: Not on file  Intimate Partner Violence: Not on file   FH:  Family History  Problem Relation Age of Onset   Asthma Mother    Heart failure Mother    Diabetes Mother    Hypertension Mother    Cancer Father    Diabetes Father    Hypertension Father    Heart failure Brother 25   Breast cancer Neg Hx    Past Medical History:  Diagnosis Date   Allergy    seasonal   Anemia    Arthritis    right hip   Gastritis    Headache    sinus/allergies   Hypertension    OSA on CPAP    overall mild obstructive sleep apnea with an AHI of 10.4/h with no central events but during REM sleep she had moderate obstructive sleep apnea with a REM AHI of 22.2 /h.  She also had nocturnal hypoxemia with O2 sats less than 88% for 8.2 minutes   Seasonal allergies    Vertigo    1-2x/month   Wears contact lenses    Current Outpatient Medications  Medication Sig Dispense Refill   acetaminophen  (TYLENOL ) 500 MG tablet Take 1,000 mg by mouth every 6 (six) hours as needed for moderate pain.     Cyanocobalamin (B-12) 3000 MCG CAPS Take 3,000 mcg by mouth daily.      dapagliflozin  propanediol (FARXIGA ) 10 MG TABS tablet Take 1 tablet (10 mg total) by mouth daily before breakfast. 90 tablet 3   famotidine  (PEPCID ) 20 MG tablet Take 1 tablet (20 mg total) by mouth 2 (two) times daily. 30 tablet 0   metoprolol  succinate (TOPROL -XL) 25 MG 24 hr tablet TAKE 1 TABLET(25 MG) BY MOUTH DAILY 90 tablet 1   Multiple Vitamin (MULTIVITAMIN) capsule Take 1 capsule by mouth daily.     OVER THE COUNTER MEDICATION Take 2 capsules by mouth at bedtime. Beauty Sleep gummies     pneumococcal 20-valent conjugate vaccine (PREVNAR 20) 0.5 ML injection Inject 0.5 mLs into the muscle once as needed for up to 1 dose for immunization. 0.5 mL 0   Polyvinyl Alcohol-Povidone (REFRESH OP) Place 1 drop into both eyes daily as needed (dry eyes).     sacubitril-valsartan (ENTRESTO ) 24-26 MG Take 1 tablet by mouth 2 (two) times daily. 180 tablet 3   semaglutide -weight management (WEGOVY ) 1 MG/0.5ML SOAJ SQ injection Inject 1 mg into the skin once a week. 2 mL 5   spironolactone  (ALDACTONE ) 25 MG tablet Take 1 tablet (25 mg total) by mouth daily. 90 tablet 3   torsemide  (DEMADEX ) 20 MG tablet Take 1 tablet (20 mg total) by mouth as needed. For weight gain of 3lb in 24 hours or 5 lb in a week     Vitamin D , Ergocalciferol , (DRISDOL ) 1.25 MG (50000 UNIT) CAPS capsule Take 1 capsule (50,000 Units total) by mouth every 7 (seven) days. 12 capsule 1   Zoster Vaccine Adjuvanted [facility]) injection Inject 0.5 mLs into the muscle once as needed for up to 1 dose. 0.5 mL 1   hydrOXYzine  (ATARAX ) 25 MG tablet Take 1 tablet (25 mg total) by mouth 3 (three) times daily. (Patient not taking: Reported on 09/30/2024) 12 tablet 0   potassium chloride  SA (KLOR-CON  M) 20 MEQ tablet Take 1 tablet (20 mEq total) by mouth 2 (two) times daily. (Patient not taking: Reported on 09/30/2024) 60 tablet 6   predniSONE  (DELTASONE ) 10 MG tablet Take 4 tablets (40 mg total) by mouth daily. (Patient not taking: Reported on  09/30/2024) 16 tablet 0   No current facility-administered medications for this encounter.   BP 136/82   Pulse 76   Ht 5' 5 (1.651 m)   Wt 129.6 kg (285 lb 12.8 oz)   LMP 02/11/2017   SpO2 98%   BMI 47.56 kg/m   Wt Readings from Last 3 Encounters:  09/30/24 129.6 kg (285 lb 12.8 oz)  08/31/24 127.9 kg (282 lb)  08/23/24 129.5 kg (285 lb 6.4 oz)   PHYSICAL EXAM: General:  well appearing.  No respiratory difficulty. Walked into clinic.  Neck: JVD flat.  Cor: Regular rate & rhythm. No murmurs. Lungs: clear Extremities: no edema  Neuro: alert & oriented x 3. Affect pleasant.   Device interrogation (personally reviewed): OptiVol down, thoracic impedence stable, no VT or AF, 4.6 hr/day activity, VP 96.1%  ASSESSMENT & PLAN:  1. Chronic HFrEF, NICM  - Developed acute HF 11/2022  - Echo 12/23 EF < 20%, severe LV dilation, mildly decreased RV systolic function, moderate-severe functional MR, severe TR.  - Cath 12/23 with elevated filling pressures. Normal cors - Uncertain cause of cardiomyopathy, ?LBBB vs HTN vs viral myocarditis.  LBBB is not markedly wide but significant dyssynchrony on echo. No family history of CHF.  - cMRI 6/24: LVEF 28% RVEF 45%, dyssynergy due to LBBB. No LGE Mod TR Mild MR - Underwent CRT-D 6/24 - Echo (9/24): EF 30-35%  - NYHA II. Volume stable on exam and by OptiVol - Continue Farxiga  10 mg daily.  - Continue torsemide  PRN - Continue spironolactone  25 mg daily.  - Continue Entresto  24/26 bid. - Continue Toprol  XL 25 mg daily. - Labs today.  2. Severe MR/TR  - Suspect functional related to cardiomyopathy.   - Improved on echo 9/24 (mild) after CRT   3. 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   - QRS on EKG 143 Stephanie pre-CRT - Underwent CRT-D 6/24 - On EKG 9/24 QRS down to > predicts benefit from CRT  4. HTN - BP well controlled - Continue GDMT as above  5. OSA - Not tolerating CPAP, last  wore 3 weeks ago.  - Was told she's not a candidate for InSpire per patient report. Follows with Dr. Shlomo.   6. Obesity - H/O Bariatric Surgery - Body mass index is 47.56 kg/m. - Continue weight loss efforts - Back on WeGovy    Follow up in 6 months with Dr. Cherrie.  Beckey LITTIE Coe, NP  3:33 PM

## 2024-09-30 ENCOUNTER — Ambulatory Visit (HOSPITAL_COMMUNITY)
Admission: RE | Admit: 2024-09-30 | Discharge: 2024-09-30 | Disposition: A | Discharge status: Home / Self Care | Source: Ambulatory Visit | Attending: Internal Medicine | Admitting: Internal Medicine

## 2024-09-30 ENCOUNTER — Encounter (HOSPITAL_COMMUNITY): Payer: Self-pay

## 2024-09-30 VITALS — BP 136/82 | HR 76 | Ht 65.0 in | Wt 285.8 lb

## 2024-09-30 DIAGNOSIS — I10 Essential (primary) hypertension: Secondary | ICD-10-CM

## 2024-09-30 DIAGNOSIS — G4733 Obstructive sleep apnea (adult) (pediatric): Secondary | ICD-10-CM | POA: Insufficient documentation

## 2024-09-30 DIAGNOSIS — I110 Hypertensive heart disease with heart failure: Secondary | ICD-10-CM | POA: Insufficient documentation

## 2024-09-30 DIAGNOSIS — I428 Other cardiomyopathies: Secondary | ICD-10-CM | POA: Diagnosis not present

## 2024-09-30 DIAGNOSIS — Z79899 Other long term (current) drug therapy: Secondary | ICD-10-CM | POA: Diagnosis not present

## 2024-09-30 DIAGNOSIS — Z9884 Bariatric surgery status: Secondary | ICD-10-CM | POA: Diagnosis not present

## 2024-09-30 DIAGNOSIS — Z6841 Body Mass Index (BMI) 40.0 and over, adult: Secondary | ICD-10-CM | POA: Insufficient documentation

## 2024-09-30 DIAGNOSIS — I081 Rheumatic disorders of both mitral and tricuspid valves: Secondary | ICD-10-CM | POA: Insufficient documentation

## 2024-09-30 DIAGNOSIS — I340 Nonrheumatic mitral (valve) insufficiency: Secondary | ICD-10-CM

## 2024-09-30 DIAGNOSIS — I447 Left bundle-branch block, unspecified: Secondary | ICD-10-CM | POA: Insufficient documentation

## 2024-09-30 DIAGNOSIS — I5022 Chronic systolic (congestive) heart failure: Secondary | ICD-10-CM | POA: Diagnosis present

## 2024-09-30 DIAGNOSIS — E66813 Obesity, class 3: Secondary | ICD-10-CM

## 2024-09-30 LAB — BASIC METABOLIC PANEL WITH GFR
Anion gap: 12 (ref 5–15)
BUN: 21 mg/dL — ABNORMAL HIGH (ref 6–20)
CO2: 26 mmol/L (ref 22–32)
Calcium: 9.1 mg/dL (ref 8.9–10.3)
Chloride: 99 mmol/L (ref 98–111)
Creatinine, Ser: 1.44 mg/dL — ABNORMAL HIGH (ref 0.44–1.00)
GFR, Estimated: 42 mL/min — ABNORMAL LOW (ref >60)
Glucose, Bld: 91 mg/dL (ref 70–99)
Potassium: 3.9 mmol/L (ref 3.5–5.1)
Sodium: 137 mmol/L (ref 135–145)

## 2024-09-30 LAB — BRAIN NATRIURETIC PEPTIDE: B Natriuretic Peptide: 39.8 pg/mL (ref 0.0–100.0)

## 2024-09-30 NOTE — Patient Instructions (Addendum)
 Good to see you today!   No medication changes were made  Labs done today, your results will be available in MyChart, we will contact you for abnormal readings.  Your physician recommends that you schedule a follow-up appointment 6 months (April) Call office in February to schedule an appointment  If you have any questions or concerns before your next appointment please send us  a message through [place] or call our office at [phone].    TO LEAVE A MESSAGE FOR THE NURSE SELECT OPTION 2, PLEASE LEAVE A MESSAGE INCLUDING: YOUR NAME DATE OF BIRTH CALL BACK NUMBER REASON FOR CALL**this is important as we prioritize the call backs  YOU WILL RECEIVE A CALL BACK THE SAME DAY AS LONG AS YOU CALL BEFORE 4:00 PM At the [facility], you and your health needs are our priority. As part of our continuing mission to provide you with exceptional heart care, we have created designated Provider Care Teams. These Care Teams include your primary Cardiologist (physician) and Advanced Practice Providers (APPs- Physician Assistants and Nurse Practitioners) who all work together to provide you with the care you need, when you need it.   You may see any of the following providers on your designated Care Team at your next follow up: Dr Toribio Fuel Dr Ezra Shuck Dr. Ria Commander Dr. Morene Brownie Amy Lenetta, NP Caffie Shed, [place] [facility] [place], [place] Beckey Coe, NP [place] Lee, NP Ellouise Class, NP Tinnie Redman, PharmD Jaun Bash, PharmD   Please be sure to bring in all your medications bottles to every appointment.    Thank you for choosing [facility]

## 2024-10-01 ENCOUNTER — Telehealth: Payer: Self-pay

## 2024-10-02 ENCOUNTER — Encounter: Payer: Self-pay | Admitting: Nurse Practitioner

## 2024-10-04 ENCOUNTER — Ambulatory Visit (HOSPITAL_COMMUNITY): Payer: Self-pay | Admitting: Internal Medicine

## 2024-10-05 ENCOUNTER — Other Ambulatory Visit: Payer: Self-pay

## 2024-10-05 MED ORDER — ONDANSETRON HCL 4 MG PO TABS: 4.0000 mg | ORAL_TABLET | Freq: Three times a day (TID) | ORAL | 0 refills | Status: AC | PRN

## 2024-10-05 NOTE — Telephone Encounter (Signed)
 Spoke she is feeling better as per alyssa sent zofran  as needed

## 2024-10-28 ENCOUNTER — Encounter: Payer: Self-pay | Admitting: Physician Assistant

## 2024-11-11 ENCOUNTER — Other Ambulatory Visit (HOSPITAL_COMMUNITY): Payer: Self-pay | Admitting: Family Medicine

## 2024-11-11 ENCOUNTER — Other Ambulatory Visit: Payer: Self-pay | Admitting: Nurse Practitioner

## 2024-11-11 DIAGNOSIS — K219 Gastro-esophageal reflux disease without esophagitis: Secondary | ICD-10-CM

## 2024-11-22 ENCOUNTER — Ambulatory Visit: Admitting: Nurse Practitioner

## 2024-11-22 ENCOUNTER — Encounter: Payer: Self-pay | Admitting: Nurse Practitioner

## 2024-11-22 VITALS — BP 132/68 | HR 80 | Temp 96.7°F | Resp 16 | Ht 65.0 in | Wt 289.2 lb

## 2024-11-22 DIAGNOSIS — N841 Polyp of cervix uteri: Secondary | ICD-10-CM | POA: Insufficient documentation

## 2024-11-22 DIAGNOSIS — Z124 Encounter for screening for malignant neoplasm of cervix: Secondary | ICD-10-CM

## 2024-11-22 DIAGNOSIS — N888 Other specified noninflammatory disorders of cervix uteri: Secondary | ICD-10-CM

## 2024-11-22 NOTE — Progress Notes (Signed)
 [facility] [address] [place], [place] [address]  Internal MEDICINE  Office Visit Note  Patient Name: Stephanie Gates  [id number]  [id number]  Date of Service: 11/22/2024  Chief Complaint  Patient presents with   Hypertension   Follow-up    HPI Stephanie Gates presents for a follow-up visit for pap smear      Current Medication: Outpatient Encounter Medications as of 11/22/2024  Medication Sig   acetaminophen  (TYLENOL ) 500 MG tablet Take 1,000 mg by mouth every 6 (six) hours as needed for moderate pain.   Cyanocobalamin (B-12) 3000 MCG CAPS Take 3,000 mcg by mouth daily.   dapagliflozin  propanediol (FARXIGA ) 10 MG TABS tablet Take 1 tablet (10 mg total) by mouth daily before breakfast. (Patient not taking: Reported on 11/22/2024)   famotidine  (PEPCID ) 20 MG tablet Take 1 tablet (20 mg total) by mouth 2 (two) times daily.   hydrOXYzine  (ATARAX ) 25 MG tablet Take 1 tablet (25 mg total) by mouth 3 (three) times daily. (Patient not taking: Reported on 09/30/2024)   metoprolol  succinate (TOPROL -XL) 25 MG 24 hr tablet TAKE 1 TABLET(25 MG) BY MOUTH DAILY   Multiple Vitamin (MULTIVITAMIN) capsule Take 1 capsule by mouth daily.   ondansetron  (ZOFRAN ) 4 MG tablet Take 1 tablet (4 mg total) by mouth every 8 (eight) hours as needed for nausea or vomiting.   OVER THE COUNTER MEDICATION Take 2 capsules by mouth at bedtime. Beauty Sleep gummies   pneumococcal 20-valent conjugate vaccine (PREVNAR 20) 0.5 ML injection Inject 0.5 mLs into the muscle once as needed for up to 1 dose for immunization.   Polyvinyl Alcohol-Povidone (REFRESH OP) Place 1 drop into both eyes daily as needed (dry eyes).   potassium chloride  SA (KLOR-CON  M) 20 MEQ tablet Take 1 tablet (20 mEq total) by mouth 2 (two) times daily. (Patient not taking: Reported on 09/30/2024)   predniSONE  (DELTASONE ) 10 MG tablet Take 4 tablets (40 mg total) by mouth daily. (Patient not taking: Reported on 09/30/2024)    sacubitril -valsartan  (ENTRESTO ) 24-26 MG Take 1 tablet by mouth 2 (two) times daily.   semaglutide -weight management (WEGOVY ) 1 MG/0.5ML SOAJ SQ injection Inject 1 mg into the skin once a week.   spironolactone  (ALDACTONE ) 25 MG tablet Take 1 tablet (25 mg total) by mouth daily.   torsemide  (DEMADEX ) 20 MG tablet Take 1 tablet (20 mg total) by mouth as needed. For weight gain of 3lb in 24 hours or 5 lb in a week   Vitamin D , Ergocalciferol , (DRISDOL ) 1.25 MG (50000 UNIT) CAPS capsule Take 1 capsule (50,000 Units total) by mouth every 7 (seven) days.   Zoster Vaccine Adjuvanted [facility]) injection Inject 0.5 mLs into the muscle once as needed for up to 1 dose.   No facility-administered encounter medications on file as of 11/22/2024.    Surgical History: Past Surgical History:  Procedure Laterality Date   BIV ICD INSERTION CRT-D N/A 06/03/2023   Procedure: BIV ICD INSERTION CRT-D;  Surgeon: Inocencio Soyla Lunger, MD;  Location: [facility] INVASIVE CV LAB;  Service: Cardiovascular;  Laterality: N/A;   COLONOSCOPY N/A 05/03/2015   Procedure: COLONOSCOPY;  Surgeon: Rogelia Copping, MD;  Location: [facility];  Service: Gastroenterology;  Laterality: N/A;   COLONOSCOPY     COLONOSCOPY WITH PROPOFOL  N/A 02/22/2021   Procedure: COLONOSCOPY WITH PROPOFOL ;  Surgeon: Copping Rogelia, MD;  Location: [facility] ENDOSCOPY;  Service: Endoscopy;  Laterality: N/A;   ESOPHAGOGASTRODUODENOSCOPY N/A 05/03/2015   Procedure: ESOPHAGOGASTRODUODENOSCOPY (EGD);  Surgeon: Rogelia Copping, MD;  Location:  [facility];  Service: Gastroenterology;  Laterality: N/A;   FOOT SURGERY  2014   Gastric bypass surgery     POLYPECTOMY  05/03/2015   Procedure: POLYPECTOMY INTESTINAL;  Surgeon: Rogelia Copping, MD;  Location: [facility];  Service: Gastroenterology;;   RIGHT HEART CATH N/A 12/06/2022   Procedure: RIGHT HEART CATH;  Surgeon: Cherrie Toribio SAUNDERS, MD;  Location: [facility] INVASIVE CV LAB;  Service: Cardiovascular;  Laterality: N/A;    RIGHT/LEFT HEART CATH AND CORONARY ANGIOGRAPHY N/A 12/13/2022   Procedure: RIGHT/LEFT HEART CATH AND CORONARY ANGIOGRAPHY;  Surgeon: Rolan Ezra RAMAN, MD;  Location: [facility] INVASIVE CV LAB;  Service: Cardiovascular;  Laterality: N/A;    Medical History: Past Medical History:  Diagnosis Date   Allergy    seasonal   Anemia    Arthritis    right hip   Gastritis    Headache    sinus/allergies   Hypertension    OSA on CPAP    overall mild obstructive sleep apnea with an AHI of 10.4/h with no central events but during REM sleep she had moderate obstructive sleep apnea with a REM AHI of 22.2 /h.  She also had nocturnal hypoxemia with O2 sats less than 88% for 8.2 minutes   Seasonal allergies    Vertigo    1-2x/month   Wears contact lenses     Family History: Family History  Problem Relation Age of Onset   Asthma Mother    Heart failure Mother    Diabetes Mother    Hypertension Mother    Cancer Father    Diabetes Father    Hypertension Father    Heart failure Brother 29   Breast cancer Neg Hx     Social History   Socioeconomic History   Marital status: Single    Spouse name: Not on file   Number of children: 1   Years of education: Not on file   Highest education level: Bachelor's degree (e.g., BA, AB, BS)  Occupational History   Occupation: Health visitor  Tobacco Use   Smoking status: Never   Smokeless tobacco: Never   Tobacco comments:    Never smoke 12/20/22  Vaping Use   Vaping status: Never Used  Substance and Sexual Activity   Alcohol use: No   Drug use: No   Sexual activity: Not Currently  Other Topics Concern   Not on file  Social History Narrative   Not on file   Social Drivers of Health   Tobacco Use: Low Risk (11/22/2024)   Patient History    Smoking Tobacco Use: Never    Smokeless Tobacco Use: Never    Passive Exposure: Not on file  Financial Resource Strain: Low Risk (12/05/2022)   Overall Financial Resource Strain (CARDIA)     Difficulty of Paying Living Expenses: Not very hard  Food Insecurity: No Food Insecurity (12/16/2022)   Hunger Vital Sign    Worried About Running Out of Food in the Last Year: Never true    Ran Out of Food in the Last Year: Never true  Transportation Needs: No Transportation Needs (12/05/2022)   PRAPARE - Administrator, Civil Service (Medical): No    Lack of Transportation (Non-Medical): No  Physical Activity: Not on file  Stress: Not on file  Social Connections: Not on file  Intimate Partner Violence: Not on file  Depression (PHQ2-9): Low Risk (07/21/2024)   Depression (PHQ2-9)    PHQ-2 Score: 0  Alcohol Screen: Low Risk (09/18/2022)  Alcohol Screen    Last Alcohol Screening Score (AUDIT): 0  Housing: Low Risk (12/05/2022)   Housing    Last Housing Risk Score: 0  Utilities: Not At Risk (12/16/2022)   AHC Utilities    Threatened with loss of utilities: No  Health Literacy: Not on file      Review of Systems  Constitutional:  Negative for chills, fatigue and unexpected weight change.  HENT:  Negative for congestion, postnasal drip, rhinorrhea, sneezing and sore throat.   Eyes:  Negative for redness.  Respiratory:  Negative for cough, chest tightness and shortness of breath.   Cardiovascular:  Negative for chest pain and palpitations.  Gastrointestinal:  Negative for abdominal pain, constipation, diarrhea, nausea and vomiting.  Genitourinary:  Negative for dyspareunia, dysuria, frequency, genital sores, menstrual problem, pelvic pain, vaginal bleeding, vaginal discharge and vaginal pain.  Musculoskeletal:  Negative for arthralgias, back pain, joint swelling and neck pain.  Skin:  Negative for rash.  Neurological: Negative.  Negative for tremors and numbness.  Hematological:  Negative for adenopathy. Does not bruise/bleed easily.  Psychiatric/Behavioral:  Negative for behavioral problems (Depression), sleep disturbance and suicidal ideas. The patient is not  nervous/anxious.     Vital Signs: BP 132/68   Pulse 80   Temp (!) 96.7 F (35.9 C)   Resp 16   Ht 5' 5 (1.651 m)   Wt 289 lb 3.2 oz (131.2 kg)   LMP 02/11/2017   SpO2 98%   BMI 48.13 kg/m    Physical Exam Vitals reviewed.  Constitutional:      General: She is not in acute distress.    Appearance: Normal appearance. She is obese. She is not ill-appearing.  HENT:     Head: Normocephalic and atraumatic.  Eyes:     Pupils: Pupils are equal, round, and reactive to light.  Cardiovascular:     Rate and Rhythm: Normal rate and regular rhythm.  Pulmonary:     Effort: Pulmonary effort is normal. No respiratory distress.  Abdominal:     Hernia: There is no hernia in the left inguinal area or right inguinal area.  Genitourinary:    General: Normal vulva.     Exam position: Lithotomy position.     Pubic Area: No rash or pubic lice.      Labia:        Right: No rash, tenderness, lesion or injury.        Left: No rash, tenderness, lesion or injury.      Urethra: No prolapse, urethral pain, urethral swelling or urethral lesion.     Vagina: No signs of injury and foreign body. No vaginal discharge, erythema, tenderness, bleeding, lesions or prolapsed vaginal walls.     Cervix: Friability, lesion (12 O'clock beige/brown spot and a polyp that is hanging off the edge of the cervical os at 11 O'clock) and cervical bleeding present.        Comments: The marked spot is the lesion and the marked oblong shape is the cervical polyp.  Lymphadenopathy:     Lower Body: No right inguinal adenopathy. No left inguinal adenopathy.  Neurological:     Mental Status: She is alert and oriented to person, place, and time.  Psychiatric:        Mood and Affect: Mood normal.        Behavior: Behavior normal.        Assessment/Plan: 1. Cervical polyp (Primary) Pap smear done, cervical polyp visualized at the 11 O'clock position on the cervical  os.  - IGP, Aptima HPV  2. Friable cervix Noted  while obtained pap smear specimen.  - IGP, Aptima HPV  3. Routine Papanicolaou smear Pelvic exam done with pap smear. Abnormal exam as noted in problem #1 and #2 and in the objective exam above.  - IGP, Aptima HPV    General Counseling: Laneice verbalizes understanding of the findings of todays visit and agrees with plan of treatment. I have discussed any further diagnostic evaluation that may be needed or ordered today. We also reviewed her medications today. she has been encouraged to call the office with any questions or concerns that should arise related to todays visit.    No orders of the defined types were placed in this encounter.   No orders of the defined types were placed in this encounter.   Return for will call with results, next scheduled visit in august next year and as needed. .   Total time spent:30 Minutes Time spent includes review of chart, medications, test results, and follow up plan with the patient.   [place] Controlled Substance Database was reviewed by me.  This patient was seen by Mardy Maxin, FNP-C in collaboration with Dr. Sigrid Bathe as a part of collaborative care agreement.   Carr Shartzer R. Maxin, MSN, FNP-C Internal medicine

## 2024-11-25 LAB — IGP, APTIMA HPV: HPV Aptima: NEGATIVE

## 2024-12-03 ENCOUNTER — Ambulatory Visit: Payer: 59

## 2024-12-03 ENCOUNTER — Ambulatory Visit: Payer: Self-pay | Admitting: Cardiology

## 2024-12-03 DIAGNOSIS — I5022 Chronic systolic (congestive) heart failure: Secondary | ICD-10-CM | POA: Diagnosis not present

## 2024-12-03 LAB — CUP PACEART REMOTE DEVICE CHECK
Battery Remaining Longevity: 109 mo
Battery Voltage: 3 V
Brady Statistic RV Percent Paced: 70.42 %
Date Time Interrogation Session: 20251226062257
HighPow Impedance: 68 Ohm
Lead Channel Impedance Value: 304 Ohm
Lead Channel Impedance Value: 304 Ohm
Lead Channel Impedance Value: 342 Ohm
Lead Channel Impedance Value: 361 Ohm
Lead Channel Impedance Value: 361 Ohm
Lead Channel Impedance Value: 380 Ohm
Lead Channel Impedance Value: 456 Ohm
Lead Channel Impedance Value: 513 Ohm
Lead Channel Impedance Value: 551 Ohm
Lead Channel Impedance Value: 570 Ohm
Lead Channel Impedance Value: 589 Ohm
Lead Channel Impedance Value: 627 Ohm
Lead Channel Impedance Value: 646 Ohm
Lead Channel Pacing Threshold Amplitude: 0.625 V
Lead Channel Pacing Threshold Amplitude: 0.75 V
Lead Channel Pacing Threshold Amplitude: 0.875 V
Lead Channel Pacing Threshold Pulse Width: 0.4 ms
Lead Channel Pacing Threshold Pulse Width: 0.4 ms
Lead Channel Pacing Threshold Pulse Width: 0.4 ms
Lead Channel Sensing Intrinsic Amplitude: 16.9 mV
Lead Channel Sensing Intrinsic Amplitude: 5 mV
Lead Channel Setting Pacing Amplitude: 1 V
Lead Channel Setting Pacing Amplitude: 1.5 V
Lead Channel Setting Pacing Amplitude: 1.5 V
Lead Channel Setting Pacing Pulse Width: 0.4 ms
Lead Channel Setting Pacing Pulse Width: 0.4 ms
Lead Channel Setting Sensing Sensitivity: 0.3 mV
Zone Setting Status: 755011
Zone Setting Status: 755011
Zone Setting Status: 755011

## 2024-12-03 NOTE — Progress Notes (Signed)
 Remote ICD Transmission

## 2024-12-06 ENCOUNTER — Ambulatory Visit: Payer: Self-pay | Admitting: Nurse Practitioner

## 2024-12-06 NOTE — Telephone Encounter (Signed)
-----   Message from Mardy Maxin, NP sent at 12/06/2024  8:25 AM EST ----- Pap smear is normal and negative for HPV, repeat pap with HPV testing in 5 years

## 2024-12-06 NOTE — Progress Notes (Signed)
 Pap smear is normal and negative for HPV, repeat pap with HPV testing in 5 years

## 2024-12-06 NOTE — Telephone Encounter (Signed)
"  Patient notified   "

## 2025-07-25 ENCOUNTER — Encounter: Admitting: Nurse Practitioner
